# Patient Record
Sex: Female | Born: 1945 | Race: White | Hispanic: No | Marital: Married | Smoking: Former smoker
Health system: Southern US, Community
[De-identification: ages and names within clinical notes are randomized; demographics above are authoritative.]

## PROBLEM LIST (undated history)

## (undated) DIAGNOSIS — E039 Hypothyroidism, unspecified: Secondary | ICD-10-CM

## (undated) DIAGNOSIS — I10 Essential (primary) hypertension: Secondary | ICD-10-CM

## (undated) DIAGNOSIS — R000 Tachycardia, unspecified: Secondary | ICD-10-CM

## (undated) DIAGNOSIS — E785 Hyperlipidemia, unspecified: Secondary | ICD-10-CM

## (undated) DIAGNOSIS — R51 Headache: Secondary | ICD-10-CM

## (undated) DIAGNOSIS — K219 Gastro-esophageal reflux disease without esophagitis: Secondary | ICD-10-CM

## (undated) DIAGNOSIS — T7840XA Allergy, unspecified, initial encounter: Secondary | ICD-10-CM

## (undated) DIAGNOSIS — K5730 Diverticulosis of large intestine without perforation or abscess without bleeding: Secondary | ICD-10-CM

## (undated) DIAGNOSIS — H269 Unspecified cataract: Secondary | ICD-10-CM

## (undated) DIAGNOSIS — H3530 Unspecified macular degeneration: Secondary | ICD-10-CM

## (undated) DIAGNOSIS — Z720 Tobacco use: Secondary | ICD-10-CM

## (undated) DIAGNOSIS — M1990 Unspecified osteoarthritis, unspecified site: Secondary | ICD-10-CM

## (undated) DIAGNOSIS — K760 Fatty (change of) liver, not elsewhere classified: Secondary | ICD-10-CM

## (undated) HISTORY — PX: EYE SURGERY: SHX253

## (undated) HISTORY — PX: OTHER SURGICAL HISTORY: SHX169

## (undated) HISTORY — DX: Allergy, unspecified, initial encounter: T78.40XA

## (undated) HISTORY — PX: SPINE SURGERY: SHX786

## (undated) HISTORY — DX: Diverticulosis of large intestine without perforation or abscess without bleeding: K57.30

## (undated) HISTORY — PX: SIGMOIDOSCOPY: SUR1295

## (undated) HISTORY — DX: Hyperlipidemia, unspecified: E78.5

## (undated) HISTORY — DX: Fatty (change of) liver, not elsewhere classified: K76.0

## (undated) HISTORY — DX: Hypothyroidism, unspecified: E03.9

## (undated) HISTORY — DX: Essential (primary) hypertension: I10

## (undated) HISTORY — DX: Tobacco use: Z72.0

## (undated) HISTORY — PX: COLONOSCOPY: SHX174

## (undated) HISTORY — DX: Unspecified macular degeneration: H35.30

## (undated) HISTORY — DX: Unspecified osteoarthritis, unspecified site: M19.90

## (undated) HISTORY — PX: CATARACT EXTRACTION: SUR2

## (undated) HISTORY — DX: Gastro-esophageal reflux disease without esophagitis: K21.9

---

## 2004-05-23 ENCOUNTER — Ambulatory Visit: Payer: Self-pay | Admitting: Family Medicine

## 2004-05-30 ENCOUNTER — Ambulatory Visit: Payer: Self-pay | Admitting: Family Medicine

## 2004-06-08 ENCOUNTER — Ambulatory Visit: Payer: Self-pay | Admitting: Family Medicine

## 2004-07-07 ENCOUNTER — Ambulatory Visit: Payer: Self-pay | Admitting: Family Medicine

## 2004-07-13 ENCOUNTER — Ambulatory Visit: Payer: Self-pay | Admitting: Family Medicine

## 2004-09-11 ENCOUNTER — Ambulatory Visit: Payer: Self-pay | Admitting: Family Medicine

## 2004-09-18 ENCOUNTER — Ambulatory Visit: Payer: Self-pay | Admitting: Family Medicine

## 2004-12-22 ENCOUNTER — Ambulatory Visit: Payer: Self-pay | Admitting: Family Medicine

## 2005-01-26 ENCOUNTER — Ambulatory Visit: Payer: Self-pay | Admitting: Family Medicine

## 2005-02-23 ENCOUNTER — Ambulatory Visit: Payer: Self-pay | Admitting: Family Medicine

## 2005-03-02 ENCOUNTER — Ambulatory Visit: Payer: Self-pay | Admitting: Family Medicine

## 2005-05-07 ENCOUNTER — Ambulatory Visit: Payer: Self-pay | Admitting: Family Medicine

## 2005-06-18 ENCOUNTER — Ambulatory Visit: Payer: Self-pay | Admitting: Family Medicine

## 2005-06-19 ENCOUNTER — Ambulatory Visit: Payer: Self-pay | Admitting: Family Medicine

## 2005-07-30 ENCOUNTER — Ambulatory Visit: Payer: Self-pay | Admitting: Family Medicine

## 2005-08-30 ENCOUNTER — Encounter: Payer: Self-pay | Admitting: Family Medicine

## 2005-08-30 LAB — CONVERTED CEMR LAB: Pap Smear: NORMAL

## 2005-09-13 ENCOUNTER — Ambulatory Visit: Payer: Self-pay | Admitting: Family Medicine

## 2005-09-17 ENCOUNTER — Ambulatory Visit: Payer: Self-pay | Admitting: Family Medicine

## 2005-09-21 ENCOUNTER — Ambulatory Visit: Payer: Self-pay | Admitting: Family Medicine

## 2005-09-24 ENCOUNTER — Ambulatory Visit: Payer: Self-pay | Admitting: Family Medicine

## 2005-09-24 ENCOUNTER — Other Ambulatory Visit: Admission: RE | Admit: 2005-09-24 | Discharge: 2005-09-24 | Payer: Self-pay | Admitting: Family Medicine

## 2005-09-24 ENCOUNTER — Encounter: Payer: Self-pay | Admitting: Family Medicine

## 2005-10-15 ENCOUNTER — Ambulatory Visit: Payer: Self-pay | Admitting: Family Medicine

## 2005-10-17 ENCOUNTER — Ambulatory Visit: Payer: Self-pay | Admitting: *Deleted

## 2005-11-06 ENCOUNTER — Ambulatory Visit: Payer: Self-pay | Admitting: Family Medicine

## 2006-04-02 ENCOUNTER — Ambulatory Visit: Payer: Self-pay | Admitting: Internal Medicine

## 2006-08-02 ENCOUNTER — Encounter: Payer: Self-pay | Admitting: Family Medicine

## 2007-01-09 ENCOUNTER — Ambulatory Visit: Payer: Self-pay | Admitting: Family Medicine

## 2007-01-09 LAB — CONVERTED CEMR LAB
ALT: 46 units/L — ABNORMAL HIGH (ref 0–35)
BUN: 13 mg/dL (ref 6–23)
Basophils Absolute: 0 10*3/uL (ref 0.0–0.1)
Chloride: 104 meq/L (ref 96–112)
GFR calc Af Amer: 72 mL/min
GFR calc non Af Amer: 60 mL/min
Glucose, Bld: 114 mg/dL — ABNORMAL HIGH (ref 70–99)
HDL: 37.2 mg/dL — ABNORMAL LOW (ref >39.0)
Hemoglobin: 15.4 g/dL — ABNORMAL HIGH (ref 12.0–15.0)
Lymphocytes Relative: 35 % (ref 12.0–46.0)
MCHC: 35.4 g/dL (ref 30.0–36.0)
MCV: 96.7 fL (ref 78.0–100.0)
Monocytes Absolute: 0.5 10*3/uL (ref 0.2–0.7)
Neutro Abs: 3 10*3/uL (ref 1.4–7.7)
Neutrophils Relative %: 53.4 % (ref 43.0–77.0)
Platelets: 201 10*3/uL (ref 150–400)
Potassium: 4.3 meq/L (ref 3.5–5.1)
Total CHOL/HDL Ratio: 4.5

## 2007-01-10 ENCOUNTER — Encounter: Payer: Self-pay | Admitting: Family Medicine

## 2007-01-10 DIAGNOSIS — M1990 Unspecified osteoarthritis, unspecified site: Secondary | ICD-10-CM | POA: Insufficient documentation

## 2007-01-10 DIAGNOSIS — R7309 Other abnormal glucose: Secondary | ICD-10-CM

## 2007-01-10 DIAGNOSIS — K8020 Calculus of gallbladder without cholecystitis without obstruction: Secondary | ICD-10-CM | POA: Insufficient documentation

## 2007-01-10 DIAGNOSIS — E785 Hyperlipidemia, unspecified: Secondary | ICD-10-CM

## 2007-01-10 DIAGNOSIS — E039 Hypothyroidism, unspecified: Secondary | ICD-10-CM | POA: Insufficient documentation

## 2007-01-10 DIAGNOSIS — K5730 Diverticulosis of large intestine without perforation or abscess without bleeding: Secondary | ICD-10-CM | POA: Insufficient documentation

## 2007-01-10 DIAGNOSIS — M109 Gout, unspecified: Secondary | ICD-10-CM

## 2007-01-10 DIAGNOSIS — N951 Menopausal and female climacteric states: Secondary | ICD-10-CM

## 2007-01-10 DIAGNOSIS — I10 Essential (primary) hypertension: Secondary | ICD-10-CM | POA: Insufficient documentation

## 2007-01-14 ENCOUNTER — Encounter (INDEPENDENT_AMBULATORY_CARE_PROVIDER_SITE_OTHER): Payer: Self-pay | Admitting: *Deleted

## 2007-01-14 ENCOUNTER — Ambulatory Visit: Payer: Self-pay | Admitting: Family Medicine

## 2007-01-14 ENCOUNTER — Other Ambulatory Visit: Admission: RE | Admit: 2007-01-14 | Discharge: 2007-01-14 | Payer: Self-pay | Admitting: Family Medicine

## 2007-01-14 ENCOUNTER — Encounter: Payer: Self-pay | Admitting: Family Medicine

## 2007-01-16 ENCOUNTER — Encounter (INDEPENDENT_AMBULATORY_CARE_PROVIDER_SITE_OTHER): Payer: Self-pay | Admitting: *Deleted

## 2007-01-17 ENCOUNTER — Encounter (INDEPENDENT_AMBULATORY_CARE_PROVIDER_SITE_OTHER): Payer: Self-pay | Admitting: *Deleted

## 2007-01-30 ENCOUNTER — Encounter (INDEPENDENT_AMBULATORY_CARE_PROVIDER_SITE_OTHER): Payer: Self-pay | Admitting: *Deleted

## 2007-01-30 ENCOUNTER — Ambulatory Visit: Payer: Self-pay | Admitting: Family Medicine

## 2007-01-31 ENCOUNTER — Ambulatory Visit: Payer: Self-pay | Admitting: Family Medicine

## 2007-06-06 ENCOUNTER — Ambulatory Visit: Payer: Self-pay | Admitting: *Deleted

## 2007-08-13 ENCOUNTER — Encounter: Payer: Self-pay | Admitting: Family Medicine

## 2007-11-11 ENCOUNTER — Ambulatory Visit: Payer: Self-pay | Admitting: Family Medicine

## 2008-03-09 ENCOUNTER — Ambulatory Visit: Payer: Self-pay | Admitting: Family Medicine

## 2008-04-19 ENCOUNTER — Telehealth: Payer: Self-pay | Admitting: Family Medicine

## 2008-06-21 ENCOUNTER — Ambulatory Visit: Payer: Self-pay | Admitting: Family Medicine

## 2008-06-21 LAB — CONVERTED CEMR LAB
Albumin: 3.9 g/dL (ref 3.5–5.2)
Basophils Absolute: 0 10*3/uL (ref 0.0–0.1)
Bilirubin, Direct: 0.3 mg/dL (ref 0.0–0.3)
Calcium: 9.9 mg/dL (ref 8.4–10.5)
Chloride: 107 meq/L (ref 96–112)
Eosinophils Absolute: 0.1 10*3/uL (ref 0.0–0.7)
GFR calc Af Amer: 93 mL/min
GFR calc non Af Amer: 77 mL/min
Glucose, Bld: 108 mg/dL — ABNORMAL HIGH (ref 70–99)
Lymphocytes Relative: 35.6 % (ref 12.0–46.0)
MCV: 99.3 fL (ref 78.0–100.0)
Microalb, Ur: 0.7 mg/dL (ref 0.0–1.9)
Monocytes Absolute: 0.7 10*3/uL (ref 0.1–1.0)
Monocytes Relative: 10.1 % (ref 3.0–12.0)
Neutro Abs: 3.6 10*3/uL (ref 1.4–7.7)
Neutrophils Relative %: 52.3 % (ref 43.0–77.0)
Platelets: 179 10*3/uL (ref 150–400)
RBC: 4.76 M/uL (ref 3.87–5.11)
RDW: 11.8 % (ref 11.5–14.6)
Sodium: 144 meq/L (ref 135–145)
TSH: 2.02 microintl units/mL (ref 0.35–5.50)
Uric Acid, Serum: 7.6 mg/dL — ABNORMAL HIGH (ref 2.4–7.0)

## 2008-06-22 LAB — CONVERTED CEMR LAB: Vit D, 1,25-Dihydroxy: 31 (ref 30–89)

## 2008-07-08 ENCOUNTER — Ambulatory Visit: Payer: Self-pay | Admitting: Family Medicine

## 2008-07-08 ENCOUNTER — Other Ambulatory Visit: Admission: RE | Admit: 2008-07-08 | Discharge: 2008-07-08 | Payer: Self-pay | Admitting: Family Medicine

## 2008-07-08 ENCOUNTER — Encounter: Payer: Self-pay | Admitting: Family Medicine

## 2008-07-08 LAB — CONVERTED CEMR LAB: Pap Smear: NORMAL

## 2008-07-12 ENCOUNTER — Encounter (INDEPENDENT_AMBULATORY_CARE_PROVIDER_SITE_OTHER): Payer: Self-pay | Admitting: *Deleted

## 2008-07-23 ENCOUNTER — Ambulatory Visit: Payer: Self-pay | Admitting: Family Medicine

## 2008-07-23 ENCOUNTER — Encounter (INDEPENDENT_AMBULATORY_CARE_PROVIDER_SITE_OTHER): Payer: Self-pay | Admitting: *Deleted

## 2008-07-23 LAB — CONVERTED CEMR LAB: OCCULT 1: NEGATIVE

## 2008-08-27 ENCOUNTER — Encounter: Payer: Self-pay | Admitting: Family Medicine

## 2009-03-31 ENCOUNTER — Telehealth: Payer: Self-pay | Admitting: Family Medicine

## 2009-06-13 ENCOUNTER — Ambulatory Visit: Payer: Self-pay | Admitting: Family Medicine

## 2009-07-15 ENCOUNTER — Ambulatory Visit: Payer: Self-pay | Admitting: Family Medicine

## 2009-07-16 ENCOUNTER — Encounter: Payer: Self-pay | Admitting: Family Medicine

## 2009-07-19 LAB — CONVERTED CEMR LAB
ALT: 64 units/L — ABNORMAL HIGH (ref 0–35)
AST: 42 units/L — ABNORMAL HIGH (ref 0–37)
Albumin: 4.7 g/dL (ref 3.5–5.2)
Alkaline Phosphatase: 65 units/L (ref 39–117)
Basophils Absolute: 0 10*3/uL (ref 0.0–0.1)
CO2: 22 meq/L (ref 19–32)
Creatinine, Ser: 0.93 mg/dL (ref 0.40–1.20)
Creatinine,U: 185.8 mg/dL
Eosinophils Relative: 1.6 % (ref 0.0–5.0)
Glucose, Bld: 101 mg/dL — ABNORMAL HIGH (ref 70–99)
HDL: 44 mg/dL (ref >39)
Hemoglobin: 16.3 g/dL — ABNORMAL HIGH (ref 12.0–15.0)
Hgb A1c MFr Bld: 5.5 % (ref 4.6–6.5)
LDL Cholesterol: 81 mg/dL (ref 0–99)
Lymphs Abs: 1.7 10*3/uL (ref 0.7–4.0)
MCHC: 33.5 g/dL (ref 30.0–36.0)
Microalb Creat Ratio: 4.8 mg/g (ref 0.0–30.0)
Monocytes Absolute: 0.5 10*3/uL (ref 0.1–1.0)
Neutrophils Relative %: 66.1 % (ref 43.0–77.0)
Platelets: 194 10*3/uL (ref 150.0–400.0)
Potassium: 4.8 meq/L (ref 3.5–5.3)
RBC: 4.8 M/uL (ref 3.87–5.11)
Sodium: 144 meq/L (ref 135–145)
Total CHOL/HDL Ratio: 3.5
Triglycerides: 138 mg/dL (ref <150)

## 2009-07-20 ENCOUNTER — Other Ambulatory Visit: Admission: RE | Admit: 2009-07-20 | Discharge: 2009-07-20 | Payer: Self-pay | Admitting: Family Medicine

## 2009-07-20 ENCOUNTER — Ambulatory Visit: Payer: Self-pay | Admitting: Family Medicine

## 2009-07-20 ENCOUNTER — Telehealth: Payer: Self-pay | Admitting: Family Medicine

## 2009-07-27 ENCOUNTER — Encounter (INDEPENDENT_AMBULATORY_CARE_PROVIDER_SITE_OTHER): Payer: Self-pay | Admitting: *Deleted

## 2009-08-05 ENCOUNTER — Ambulatory Visit: Payer: Self-pay | Admitting: Family Medicine

## 2009-08-05 LAB — CONVERTED CEMR LAB
Bilirubin Urine: NEGATIVE
Protein, U semiquant: NEGATIVE
Specific Gravity, Urine: <1.005
Urobilinogen, UA: 0.2

## 2009-08-29 ENCOUNTER — Ambulatory Visit: Payer: Self-pay | Admitting: Family Medicine

## 2009-08-29 LAB — CONVERTED CEMR LAB
OCCULT 1: NEGATIVE
OCCULT 3: NEGATIVE

## 2009-09-02 ENCOUNTER — Encounter: Payer: Self-pay | Admitting: Family Medicine

## 2009-09-05 ENCOUNTER — Encounter (INDEPENDENT_AMBULATORY_CARE_PROVIDER_SITE_OTHER): Payer: Self-pay | Admitting: *Deleted

## 2009-09-06 ENCOUNTER — Encounter: Payer: Self-pay | Admitting: Family Medicine

## 2009-09-07 ENCOUNTER — Telehealth: Payer: Self-pay | Admitting: Family Medicine

## 2009-10-14 ENCOUNTER — Ambulatory Visit: Payer: Self-pay | Admitting: Family Medicine

## 2009-10-14 LAB — CONVERTED CEMR LAB: TSH: 0.07 microintl units/mL — ABNORMAL LOW (ref 0.35–5.50)

## 2009-10-18 ENCOUNTER — Ambulatory Visit: Payer: Self-pay | Admitting: Family Medicine

## 2010-02-06 ENCOUNTER — Encounter (INDEPENDENT_AMBULATORY_CARE_PROVIDER_SITE_OTHER): Payer: Self-pay | Admitting: *Deleted

## 2010-02-14 ENCOUNTER — Encounter: Payer: Self-pay | Admitting: Family Medicine

## 2010-03-10 ENCOUNTER — Encounter: Payer: Self-pay | Admitting: Family Medicine

## 2010-03-10 LAB — HM MAMMOGRAPHY: HM Mammogram: NORMAL

## 2010-03-13 ENCOUNTER — Encounter (INDEPENDENT_AMBULATORY_CARE_PROVIDER_SITE_OTHER): Payer: Self-pay | Admitting: *Deleted

## 2010-03-13 ENCOUNTER — Encounter: Payer: Self-pay | Admitting: Family Medicine

## 2010-07-04 DIAGNOSIS — F17200 Nicotine dependence, unspecified, uncomplicated: Secondary | ICD-10-CM | POA: Insufficient documentation

## 2010-08-01 NOTE — Miscellaneous (Signed)
  Clinical Lists Changes  Observations: Added new observation of MAMMO DUE: 03/11/2011 (03/10/2010 16:49) Added new observation of MAMMOGRAM: Normal (03/10/2010 16:49)

## 2010-08-01 NOTE — Progress Notes (Signed)
Summary: refill request for colchicine  Phone Note Refill Request Message from:  Fax from Pharmacy  Refills Requested: Medication #1:  COLCHICINE 0.6 MG TABS Take 1 tablet by mouth twice a day as needed [facility] [place]  No last date filled given.  Initial call taken by: Lowella Petties CMA,  September 07, 2009 3:28 PM    Prescriptions: COLCHICINE 0.6 MG TABS (COLCHICINE) Take 1 tablet by mouth twice a day as needed  #60 x 0   Entered and Authorized by:   Shaune Leeks MD   Signed by:   Shaune Leeks MD on 09/07/2009   Method used:   [facility]        [facility] [phone] (retail[facility]       [address]       [place], [place]  [address]       Ph: [phone]       Fax: [phone]   RxID:   [id number]

## 2010-08-01 NOTE — Assessment & Plan Note (Signed)
Summary: ?UTI/CLE   Vital Signs:  Patient profile:   65 year old female Height:      64 inches Weight:      180.50 pounds BMI:     31.09 Temp:     98 degrees F oral Pulse rate:   84 / minute Pulse rhythm:   regular BP sitting:   130 / 76  (left arm) Cuff size:   regular  Vitals Entered By: Delilah Shan CMA Duncan Dull) (August 05, 2009 12:03 PM) CC: ? UTI   History of Present Illness: 65 yo with UTI symptoms.  Increased frequency, dysuria x 4 days. No back pain. No fevers. No n/v/d. No hematuria. Does not get UTIs frequently, says she has probably had 2 or 3 in her lifetime.  Current Medications (verified): 1)  Colchicine 0.6 Mg Tabs (Colchicine) .... Take 1 Tablet By Mouth Twice A Day As Needed 2)  Levothyroxine Sodium 200 Mcg Tabs (Levothyroxine Sodium) .... One Tab By Mouth Once Daily 3)  Toprol Xl 200 Mg  Tb24 (Metoprolol Succinate) .Marland Kitchen.. 1 By Mouth Once Daily 4)  Centrum Silver   Tabs (Multiple Vitamins-Minerals) .... Once Daily 5)  Caltrate 600 1500 Mg  Tabs (Calcium Carbonate) .... Once Daily 6)  Lipitor 20 Mg  Tabs (Atorvastatin Calcium) .... Once Daily 7)  Cipro 250 Mg Tabs (Ciprofloxacin Hcl) .Marland Kitchen.. 1 Tab By Mouth Two Times A Day X 3 Days  Allergies: 1)  ! Penicillin V Potassium (Penicillin V Potassium) 2)  ! Tetracycline Hcl (Tetracycline Hcl) 3)  ! Sulfasalazine (Sulfasalazine) 4)  ! Biaxin (Clarithromycin) 5)  ! * Aces 6)  ! Tricor (Fenofibrate) 7)  ! Slo-Niacin (Niacin)  Review of Systems      See HPI General:  Denies chills, fever, and malaise. GI:  Denies abdominal pain, nausea, and vomiting. GU:  Complains of dysuria and urinary frequency; denies hematuria and incontinence.  Physical Exam  General:  Well-developed,well-nourished,in no acute distress; alert,appropriate and cooperative throughout examination. VS reviewed- afebrile and normotensive. Mouth:  MMM Abdomen:  NO suprapubic tenderness NO CVA tenderness. Psych:  Cognition and judgment  appear intact. Alert and cooperative with normal attention span and concentration. No apparent delusions, illusions, hallucinations   Impression & Recommendations:  Problem # 1:  ACUTE CYSTITIS (ICD-595.0) Assessment New Uncomplicated. Will treat with 3 day course of cipro. Send for cultlure only b/c she has so many allergies to antibiotics so if she fails cipro, will need to know sensitivities. Her updated medication list for this problem includes:    Cipro 250 Mg Tabs (Ciprofloxacin hcl) .Marland Kitchen... 1 tab by mouth two times a day x 3 days  Complete Medication List: 1)  Colchicine 0.6 Mg Tabs (Colchicine) .... Take 1 tablet by mouth twice a day as needed 2)  Levothyroxine Sodium 200 Mcg Tabs (Levothyroxine sodium) .... One tab by mouth once daily 3)  Toprol Xl 200 Mg Tb24 (Metoprolol succinate) .Marland Kitchen.. 1 by mouth once daily 4)  Centrum Silver Tabs (Multiple vitamins-minerals) .... Once daily 5)  Caltrate 600 1500 Mg Tabs (Calcium carbonate) .... Once daily 6)  Lipitor 20 Mg Tabs (Atorvastatin calcium) .... Once daily 7)  Cipro 250 Mg Tabs (Ciprofloxacin hcl) .Marland Kitchen.. 1 tab by mouth two times a day x 3 days  Other Orders: UA Dipstick w/o Micro (manual) (84696) T-Culture, Urine ([id number])  Patient Instructions: 1)  Drink plenty of fluids up to 3-4 quarts a day. Cranberry juice is especially recommended in addition to large amounts of water. Avoid caffeine &  carbonated drinks, they tend to irritate the bladder, Return in 3-5 days if you're not better: sooner if you're feeling worse.  Prescriptions: CIPRO 250 MG TABS (CIPROFLOXACIN HCL) 1 tab by mouth two times a day x 3 days  #6 x 0   Entered and Authorized by:   Ruthe Mannan MD   Signed by:   Ruthe Mannan MD on 08/05/2009   Method used:   [facility]        [facility] [phone]* (retail)       [address]       [place], [place]  [address]       Ph: [phone]       Fax: [phone]   RxID:   [id number]   Current  Allergies (reviewed today): ! PENICILLIN V POTASSIUM (PENICILLIN V POTASSIUM) ! TETRACYCLINE HCL (TETRACYCLINE HCL) ! SULFASALAZINE (SULFASALAZINE) ! BIAXIN (CLARITHROMYCIN) ! * ACES ! TRICOR (FENOFIBRATE) ! SLO-NIACIN (NIACIN)  Laboratory Results   Urine Tests   Date/Time Reported: August 05, 2009 12:16 PM   Routine Urinalysis   Color: straw Appearance: Cloudy Glucose: negative   (Normal Range: Negative) Bilirubin: negative   (Normal Range: Negative) Ketone: negative   (Normal Range: Negative) Spec. Gravity: <1.005   (Normal Range: 1.003-1.035) Blood: small   (Normal Range: Negative) pH: 6.0   (Normal Range: 5.0-8.0) Protein: negative   (Normal Range: Negative) Urobilinogen: 0.2   (Normal Range: 0-1) Nitrite: negative   (Normal Range: Negative) Leukocyte Esterace: moderate   (Normal Range: Negative)       Appended Document: ?UTI/CLE

## 2010-08-01 NOTE — Progress Notes (Signed)
Summary: Verification of Levothyroxin med  Phone Note Call from Patient Call back at [phone]   Caller: Patient Call For: Shaune Leeks MD Summary of Call: Pt left v/m on triage phone that the wrong med had been sent to pharmacy. I called pt but no answer and [facility] [place] and [facility] first time they have filled med. Both Levothyroxine sodium and [facility] [place]. Which do you want pt to have or . Thank you. Initial call taken by: Lewanda Rife LPN,  July 20, 2009 10:38 AM  Follow-up for Phone Call        200. Sorry...she is supposed to call. Thank you. Follow-up by: Shaune Leeks MD,  July 20, 2009 10:41 AM  Additional Follow-up for Phone Call Additional follow up Details #1::        Pharmacy notified as instructed by telephone. Additional Follow-up by: Sydell Axon LPN,  July 20, 2009 10:45 AM     Appended Document: Verification of Levothyroxin med Pt called to make sure her med had been called in. I told pt the pharmacy had been notified of correct medication and to call pharmacy before going to pick med up to make sure it was ready. Pt was appreciative.

## 2010-08-01 NOTE — Letter (Signed)
Summary: Results Follow up Letter  [place] at [facility]  [address] [place], [place] [address]   Phone: [phone]  Fax: [phone]    09/05/2009 MRN: [id number]  [facility] [address] [place], [place]  [address]  Dear Ms. Sanon,  The following are the results of your recent test(s):  Test         Result    Pap Smear:        Normal _____  Not Normal _____ Comments: ______________________________________________________ Cholesterol: LDL(Bad cholesterol):         Your goal is less than:         HDL (Good cholesterol):       Your goal is more than: Comments:  ______________________________________________________ Mammogram:        Normal _____  Not Normal _____ Comments:  ___________________________________________________________________ Hemoccult:        Normal __X___  Not normal _______ Comments:  Please repeat in one year.  _____________________________________________________________________ Other Tests:    We routinely do not discuss normal results over the telephone.  If you desire a copy of the results, or you have any questions about this information we can discuss them at your next office visit.   Sincerely,     Laurita Quint, MD

## 2010-08-01 NOTE — Letter (Signed)
Summary: Results Follow up Letter  [place] at [facility]  [address] [place], [place] [address]   Phone: [phone]  Fax: [phone]    07/27/2009 MRN: [id number]  [facility] [address] [place], [place]  [address]  Dear Ms. Geller,  The following are the results of your recent test(s):  Test         Result    Pap Smear:        Normal _X____  Not Normal _____ Comments: Please follow-up in one year. ______________________________________________________ Cholesterol: LDL(Bad cholesterol):         Your goal is less than:         HDL (Good cholesterol):       Your goal is more than: Comments:  ______________________________________________________ Mammogram:        Normal _____  Not Normal _____ Comments:  ___________________________________________________________________ Hemoccult:        Normal _____  Not normal _______ Comments:    _____________________________________________________________________ Other Tests:    We routinely do not discuss normal results over the telephone.  If you desire a copy of the results, or you have any questions about this information we can discuss them at your next office visit.   Sincerely,     Laurita Quint, MD

## 2010-08-01 NOTE — Letter (Signed)
Summary: Nadara Eaton letter  [place] at [facility]  [address] [place], [place] [address]   Phone: [phone]  Fax: [phone]       02/06/2010 MRN: [id number]  [facility] [address] [place], [place]  [address]  Dear Ms. Corle,  [facility] and [place] announce the retirement of Arta Silence, M.D., from full-time practice at the [facility] office effective December 29, 2009 and his plans of returning part-time.  It is important to Dr. Hetty Ely and to our practice that you understand that [facility] has seven physicians in our office for your health care needs.  We will continue to offer the same exceptional care that you have today.    Dr. Hetty Ely has spoken to many of you about his plans for retirement and returning part-time in the fall.   We will continue to work with you through the transition to schedule appointments for you in the office and meet the high standards that [place] is committed to.   Again, it is with great pleasure that we share the news that Dr. Hetty Ely will return to [facility] in October of 2011 with a reduced schedule.    If you have any questions, or would like to request an appointment with one of our physicians, please call us at [phone] and press the option for Scheduling an appointment.  We take pleasure in providing you with excellent patient care and look forward to seeing you at your next office visit.  Our [facility] Physicians are:  Tillman Abide, M.D. Laurita Quint, M.D. Roxy Manns, M.D. Kerby Nora, M.D. Hannah Beat, M.D. Ruthe Mannan, M.D. We proudly welcomed Raechel Ache, M.D. and Eustaquio Boyden, M.D. to the practice in July/August 2011.  Sincerely,  [facility]

## 2010-08-01 NOTE — Letter (Signed)
Summary: Results Follow up Letter  [place] at [facility]  [address] [place], [place] [address]   Phone: [phone]  Fax: [phone]    03/13/2010 MRN: [id number]    [facility] [address] [place], [place]  [address]     Dear Ms. Burkel,  The following are the results of your recent test(s):  Test         Result    Pap Smear:        Normal _____  Not Normal _____ Comments: ______________________________________________________ Cholesterol: LDL(Bad cholesterol):         Your goal is less than:         HDL (Good cholesterol):       Your goal is more than: Comments:  ______________________________________________________ Mammogram:        Normal __X___  Not Normal _____ Comments:  Yearly follow up is recommended.   ___________________________________________________________________ Hemoccult:        Normal _____  Not normal _______ Comments:    _____________________________________________________________________ Other Tests:    We routinely do not discuss normal results over the telephone.  If you desire a copy of the results, or you have any questions about this information we can discuss them at your next office visit.   Sincerely,   Dwana Curd. Para March, M.D.  [facility]

## 2010-08-01 NOTE — Assessment & Plan Note (Signed)
Summary: CPX/RBH   Vital Signs:  Patient profile:   65 year old female Weight:      185 pounds Temp:     98.6 degrees F oral Pulse rate:   84 / minute Pulse rhythm:   regular BP sitting:   126 / 78  (left arm) Cuff size:   large  Vitals Entered By: Sydell Axon LPN (July 20, 2009 8:35 AM) CC: 30 Minute checkup, hemoccult cards given to patient   History of Present Illness: Pt here for Comp Exam, still congested. Pt doing well and feels fine.  Preventive Screening-Counseling & Management  Alcohol-Tobacco     Alcohol drinks/day: <1     Alcohol type: wine      Smoking Status: current     Smoking Cessation Counseling: yes     Year Started: 1967, quit with kids and restarted.  Caffeine-Diet-Exercise     Caffeine use/day: 1     Does Patient Exercise: yes     Type of exercise: walking     Exercise (avg: min/session):     Times/week: 5  Problems Prior to Update: 1)  Sinusitis - Acute-nos  (ICD-461.9) 2)  Screening For Malignannt Neoplasm, Site Nec  (ICD-V76.49) 3)  Health Maintenance Exam  (ICD-V70.0) 4)  Cholelithiasis  (ICD-574.20) 5)  Hyperglycemia  (ICD-790.29) 6)  Disorder, Tobacco Use  (ICD-305.1) 7)  Menopausal Syndrome  (ICD-627.2) 8)  Osteoarthritis  (ICD-715.90) 9)  Diverticulosis, Colon  (ICD-562.10) 10)  Hypothyroidism Nos  (ICD-244.9) 11)  Gout Nos  (ICD-274.9) 12)  Hyperlipidemia Nec/nos  (ICD-272.4) 13)  Hypertension, Benign Essential  (ICD-401.1)  Medications Prior to Update: 1)  Colchicine 0.6 Mg Tabs (Colchicine) .... Take 1 Tablet By Mouth Twice A Day Prn 2)  Levothyroxine Sodium 175 Mcg  Tabs (Levothyroxine Sodium) .Marland Kitchen.. 1 By Mouth Once Daily 3)  Toprol Xl 200 Mg  Tb24 (Metoprolol Succinate) .Marland Kitchen.. 1 By Mouth Once Daily 4)  Centrum Silver   Tabs (Multiple Vitamins-Minerals) .... Once Daily 5)  Caltrate 600 1500 Mg  Tabs (Calcium Carbonate) .... Once Daily 6)  Lipitor 20 Mg  Tabs (Atorvastatin Calcium) .... Once Daily  Allergies: 1)  !  Penicillin V Potassium (Penicillin V Potassium) 2)  ! Tetracycline Hcl (Tetracycline Hcl) 3)  ! Sulfasalazine (Sulfasalazine) 4)  ! Biaxin (Clarithromycin) 5)  ! * Aces 6)  ! Tricor (Fenofibrate) 7)  ! Slo-Niacin (Niacin)  Past History:  Past Medical History: Last updated: 03/09/2008 Diverticulosis, colon Gout Hyperlipidemia Hypertension Hypothyroidism Osteoarthritis tab abuse   Past Surgical History: Last updated: 01/14/2007             NSVD x 2 9/99      Flex/Wood nml. 3/02      DEXA nml. 07/2005  CT abdomen and pelvis  Family History: Last updated: Jul 26, 2008 Father: Died at 57 11-29-1982) ulcer surgery, surgical error         18-Nov-2022 Mother: Died 2, angina, ? 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 Eyes:  Denies blurring, discharge, and eye pain. ENT:  Denies decreased hearing, ear discharge, earache, and ringing in ears. CV:  Complains of shortness of breath with exertion; denies chest pain or discomfort, fainting, fatigue, and palpitations; mild. Resp:  Complains of cough; denies shortness of breath and wheezing. GI:  Denies abdominal  pain, bloody stools, change in bowel habits, constipation, dark tarry stools, diarrhea, indigestion, loss of appetite, nausea, vomiting, vomiting blood, and yellowish skin color. GU:  Denies discharge, dysuria, nocturia, and urinary frequency. MS:  Complains of joint pain; denies muscle aches and stiffness; mild, normal for age. Derm:  Complains of dryness; denies hair loss, itching, and rash. Neuro:  Denies numbness, poor balance, tingling, and tremors.  Physical Exam  General:  Well-developed,well-nourished,in no acute distress; alert,appropriate and cooperative throughout examination. Head:  Normocephalic and atraumatic without obvious abnormalities. No apparent alopecia or balding. Sinuses NT. Eyes:  Conjunctiva clear bilaterally.  Ears:  External ear exam shows no significant lesions or deformities.  Otoscopic examination reveals clear canals, tympanic membranes are intact bilaterally without bulging, retraction, inflammation or discharge. Hearing is grossly normal bilaterally. Nose:  External nasal examination shows no deformity or inflammation. Nasal mucosa are pink and moist without lesions or exudates. Mouth:  Oral mucosa and oropharynx without lesions or exudates.  Teeth in good repair. Neck:  No deformities, masses, or tenderness noted. Chest Wall:  No deformities, masses, or tenderness noted. Breasts:  No mass, nodules, thickening, tenderness, bulging, retraction, inflamation, nipple discharge or skin changes noted.   Lungs:  Normal respiratory effort, chest expands symmetrically. Lungs are clear to auscultation, no crackles or wheezes. Heart:  Normal rate and regular rhythm. S1 and S2 normal without gallop, murmur, click, rub or other extra sounds. Abdomen:  Bowel sounds positive,abdomen soft and non-tender without masses, organomegaly or hernias noted. Rectal:  No external abnormalities noted. Normal sphincter tone. No rectal masses or tenderness. G neg. Genitalia:  Pelvic Exam:         External: normal female genitalia without lesions or masses        Vagina: normal without lesions or masses        Cervix: normal without lesions or masses        Adnexa: normal bimanual exam without masses or fullness        Uterus: normal by palpation        Pap smear: performed Msk:  No deformity or scoliosis noted of thoracic or lumbar spine.   Pulses:  R and L carotid,radial,femoral,dorsalis pedis and posterior tibial pulses are full and equal bilaterally Extremities:  No clubbing, cyanosis, edema, or deformity noted with normal full range of motion of all joints.   Neurologic:  No cranial nerve deficits noted. Station and gait are normal. Plantar reflexes are down-going bilaterally. DTRs are symmetrical throughout. Sensory, motor and coordinative functions appear intact. Skin:  Intact without suspicious lesions or rashes Cervical Nodes:  No lymphadenopathy noted Axillary Nodes:  No palpable lymphadenopathy Inguinal Nodes:  No significant adenopathy Psych:  Cognition and judgment appear intact. Alert and cooperative with normal attention span and concentration. No apparent delusions, illusions, hallucinations   Impression & Recommendations:  Problem # 1:  HEALTH MAINTENANCE EXAM (ICD-V70.0) Assessment Comment Only  Problem # 2:  SINUSITIS -  ACUTE-NOS (ICD-461.9) Assessment: Improved Better but still congested. See instructions.  Problem # 3:  HYPERGLYCEMIA (ICD-790.29) Assessment: Unchanged Stablee but still evident. Avoid sweets and carbs. Labs Reviewed: Creat: 0.93 (07/16/2009)     Problem # 4:  DISORDER, TOBACCO USE (ICD-305.1) Assessment: Unchanged Encouraged to quit.  Problem # 5:  DIVERTICULOSIS, COLON (ICD-562.10) Assessment: Unchanged Discussed need to be seen for prolonged LLQ discomfort.  Problem # 6:  HYPOTHYROIDISM NOS (ICD-244.9) Assessment: Deteriorated Needs slight increase in replacement.  Incr and recheck. Her updated medication list for this  problem includes:    Levothyroxine Sodium 200 Mcg Tabs (Levothyroxine sodium) ..... One tab by mouth once daily  Labs Reviewed: TSH: 5.83 (07/15/2009)    HgBA1c: 5.5 (07/15/2009) Chol: 153 (07/16/2009)   HDL: 44 (07/16/2009)   LDL: 81 (07/16/2009)   TG: 138 (07/16/2009)  Problem # 7:  HYPERLIPIDEMIA NEC/NOS (ICD-272.4) Assessment: Unchanged Well controlled and stable. Her updated medication list for this problem includes:    Lipitor 20 Mg Tabs (Atorvastatin calcium) ..... Once daily  Labs Reviewed: SGOT: 42 (07/16/2009)   SGPT: 64 (07/16/2009)   HDL:44 (07/16/2009), 46.6 (06/21/2008)  LDL:81 (07/16/2009), DEL (91/47/8295)  Chol:153 (07/16/2009), 174 (06/21/2008)  Trig:138 (07/16/2009), 228 (06/21/2008)  Problem # 8:  HYPERTENSION, BENIGN ESSENTIAL (ICD-401.1) Assessment: Unchanged Stable. Cont curr meds. Her updated medication list for this problem includes:    Toprol Xl 200 Mg Tb24 (Metoprolol succinate) .Marland Kitchen... 1 by mouth once daily  BP today: 126/78 Prior BP: 100/70 (06/13/2009)  Labs Reviewed: K+: 4.8 (07/16/2009) Creat: : 0.93 (07/16/2009)   Chol: 153 (07/16/2009)   HDL: 44 (07/16/2009)   LDL: 81 (07/16/2009)   TG: 138 (07/16/2009)  Complete Medication List: 1)  Colchicine 0.6 Mg Tabs (Colchicine) .... Take 1 tablet by mouth twice a day as needed 2)  Levothyroxine Sodium 200 Mcg Tabs (Levothyroxine sodium) .... One tab by mouth once daily 3)  Toprol Xl 200 Mg Tb24 (Metoprolol succinate) .Marland Kitchen.. 1 by mouth once daily 4)  Centrum Silver Tabs (Multiple vitamins-minerals) .... Once daily 5)  Caltrate 600 1500 Mg Tabs (Calcium carbonate) .... Once daily 6)  Lipitor 20 Mg Tabs (Atorvastatin calcium) .... Once daily  Patient Instructions: 1)  Take Guaifenesin by [facility] or [facility] and getting MUCOUS RELIEF EXPECTORANT (400mg ), take 11/2 tabs by mouth AM and NOON. 2)  Drink lots of fluids anytime taking Guaifenesin.  3)  Change thyroid med to , not  150. 4)  RTC 3 mos reheck TSH prior 244.9 Prescriptions: LEVOTHYROXINE SODIUM 200 MCG TABS (LEVOTHYROXINE SODIUM) one tab by mouth once daily  #90 x 4   Entered and Authorized by:   Shaune Leeks MD   Signed by:   Shaune Leeks MD on 07/20/2009   Method used:   [facility]        [facility] [phone]* (retail)       [address]       [place], [place]  [address]       Ph: [phone]       Fax: [phone]   RxID:   [id number] LEVOTHYROXINE SODIUM 150 MCG TABS (LEVOTHYROXINE SODIUM) one tab by mouth once daily  #90 x 4   Entered and Authorized by:   Shaune Leeks MD   Signed by:   Shaune Leeks MD on 07/20/2009   Method used:   [facility] [phone]* (retail)       [address]       [place], [place]  [address]       Ph: [phone]       Fax: [phone]   RxID:   [id number]   Current Allergies (reviewed today): ! PENICILLIN V POTASSIUM (PENICILLIN V POTASSIUM) ! TETRACYCLINE HCL (TETRACYCLINE HCL) ! SULFASALAZINE (SULFASALAZINE) ! BIAXIN (CLARITHROMYCIN) ! * ACES ! TRICOR (FENOFIBRATE) ! SLO-NIACIN (NIACIN)

## 2010-08-01 NOTE — Assessment & Plan Note (Signed)
Summary: FOLLOW UP   Vital Signs:  Patient profile:   65 year old female Weight:      170.25 pounds Temp:     98.1 degrees F oral Pulse rate:   64 / minute Pulse rhythm:   regular BP sitting:   120 / 76  (left arm) Cuff size:   regular  Vitals Entered By: Sydell Axon LPN (October 18, 2009 9:07 AM) CC: Follow-up after labs   History of Present Illness: Pt here for recheck of her thyroid. We increased her dose last time basically because she was minimally undercontrolled and wanted to have more energy. She has felt significantly better since being on the higher dose. She is back today for recheck. She feels great!! She also needs refills of other meds.  Problems Prior to Update: 1)  Acute Cystitis  (ICD-595.0) 2)  Dysuria  (ICD-788.1) 3)  Sinusitis - Acute-nos  (ICD-461.9) 4)  Screening For Malignannt Neoplasm, Site Nec  (ICD-V76.49) 5)  Health Maintenance Exam  (ICD-V70.0) 6)  Cholelithiasis  (ICD-574.20) 7)  Hyperglycemia  (ICD-790.29) 8)  Disorder, Tobacco Use  (ICD-305.1) 9)  Menopausal Syndrome  (ICD-627.2) 10)  Osteoarthritis  (ICD-715.90) 11)  Diverticulosis, Colon  (ICD-562.10) 12)  Hypothyroidism Nos  (ICD-244.9) 13)  Gout Nos  (ICD-274.9) 14)  Hyperlipidemia Nec/nos  (ICD-272.4) 15)  Hypertension, Benign Essential  (ICD-401.1)  Medications Prior to Update: 1)  Colchicine 0.6 Mg Tabs (Colchicine) .... Take 1 Tablet By Mouth Twice A Day As Needed 2)  Levothyroxine Sodium 200 Mcg Tabs (Levothyroxine Sodium) .... One Tab By Mouth Once Daily 3)  Toprol Xl 200 Mg  Tb24 (Metoprolol Succinate) .Marland Kitchen.. 1 By Mouth Once Daily 4)  Centrum Silver   Tabs (Multiple Vitamins-Minerals) .... Once Daily 5)  Caltrate 600 1500 Mg  Tabs (Calcium Carbonate) .... Once Daily 6)  Lipitor 20 Mg  Tabs (Atorvastatin Calcium) .... Once Daily 7)  Cipro 250 Mg Tabs (Ciprofloxacin Hcl) .Marland Kitchen.. 1 Tab By Mouth Two Times A Day X 3 Days  Allergies: 1)  ! Penicillin V Potassium (Penicillin V Potassium) 2)   ! Tetracycline Hcl (Tetracycline Hcl) 3)  ! Sulfasalazine (Sulfasalazine) 4)  ! Biaxin (Clarithromycin) 5)  ! * Aces 6)  ! Tricor (Fenofibrate) 7)  ! Slo-Niacin (Niacin)  Physical Exam  General:  Well-developed,well-nourished,in no acute distress; alert,appropriate and cooperative throughout examination. VS reviewed- afebrile and normotensive. Head:  Normocephalic and atraumatic without obvious abnormalities. No apparent alopecia or balding. Sinuses NT. Eyes:  Conjunctiva clear bilaterally.  Ears:  External ear exam shows no significant lesions or deformities.  Otoscopic examination reveals clear canals, tympanic membranes are intact bilaterally without bulging, retraction, inflammation or discharge. Hearing is grossly normal bilaterally. Nose:  External nasal examination shows no deformity or inflammation. Nasal mucosa are pink and moist without lesions or exudates. Mouth:  MMM Neck:  No deformities, masses, or tenderness noted. Lungs:  Normal respiratory effort, chest expands symmetrically. Lungs are clear to auscultation, no crackles or wheezes. Heart:  Normal rate and regular rhythm. S1 and S2 normal without gallop, murmur, click, rub or other extra sounds. Skin:  Hypertrophied nodule behind right ear, approx 1cmx21mm.   Impression & Recommendations:  Problem # 1:  HYPOTHYROIDISM NOS (ICD-244.9) Assessment Deteriorated  Overcontrolled. Will  decrease dose. May occas take of what she has left. Her updated medication list for this problem includes:    Levothyroxine Sodium 175 Mcg Tabs (Levothyroxine sodium) ..... One tab by mouth daily  Labs Reviewed: TSH: 0.07 (10/14/2009)  HgBA1c: 5.5 (07/15/2009) Chol: 153 (07/16/2009)   HDL: 44 (07/16/2009)   LDL: 81 (07/16/2009)   TG: 138 (07/16/2009)  Problem # 2:  HYPERLIPIDEMIA NEC/NOS (ICD-272.4) Needs refill.Marland KitchenMarland KitchenCont curr dose. Her updated medication list for this problem includes:    Lipitor 20 Mg Tabs (Atorvastatin  calcium) .Marland Kitchen... Take one by mouth daily  Labs Reviewed: SGOT: 42 (07/16/2009)   SGPT: 64 (07/16/2009)   HDL:44 (07/16/2009), 46.6 (06/21/2008)  LDL:81 (07/16/2009), DEL (78/46/9629)  Chol:153 (07/16/2009), 174 (06/21/2008)  Trig:138 (07/16/2009), 228 (06/21/2008)  Problem # 3:  HYPERTENSION, BENIGN ESSENTIAL (ICD-401.1) Assessment: Unchanged Great control. Cont curr dose of meds. Her updated medication list for this problem includes:    Toprol Xl 200 Mg Tb24 (Metoprolol succinate) .Marland Kitchen... 1 by mouth once daily  BP today: 120/76 Prior BP: 130/76 (08/05/2009)  Labs Reviewed: K+: 4.8 (07/16/2009) Creat: : 0.93 (07/16/2009)   Chol: 153 (07/16/2009)   HDL: 44 (07/16/2009)   LDL: 81 (07/16/2009)   TG: 138 (07/16/2009)  Complete Medication List: 1)  Colchicine 0.6 Mg Tabs (Colchicine) .... Take 1 tablet by mouth twice a day as needed 2)  Levothyroxine Sodium 175 Mcg Tabs (Levothyroxine sodium) .... One tab by mouth daily 3)  Toprol Xl 200 Mg Tb24 (Metoprolol succinate) .Marland Kitchen.. 1 by mouth once daily 4)  Centrum Silver Tabs (Multiple vitamins-minerals) .... Once daily 5)  Caltrate 600 1500 Mg Tabs (Calcium carbonate) .... Once daily 6)  Lipitor 20 Mg Tabs (Atorvastatin calcium) .... Take one by mouth daily  Patient Instructions: 1)  Call end of next month for appt in Jan for Comp Exam. Prescriptions: LEVOTHYROXINE SODIUM 175 MCG TABS (LEVOTHYROXINE SODIUM) one tab by mouth daily  #30 x 12   Entered and Authorized by:   Shaune Leeks MD   Signed by:   Shaune Leeks MD on 10/18/2009   Method used:   Print then Give to Patient   RxID:   [id number] LIPITOR 20 MG  TABS (ATORVASTATIN CALCIUM) Take one by mouth daily  #90 x 3   Entered by:   Sydell Axon LPN   Authorized by:   Shaune Leeks MD   Signed by:   Sydell Axon LPN on 36/64/4034   Method used:   Print then Give to Patient   RxID:   [id number] TOPROL XL 200 MG  TB24 (METOPROLOL SUCCINATE) 1 by mouth once  daily  #90 x 3   Entered by:   Sydell Axon LPN   Authorized by:   Shaune Leeks MD   Signed by:   Sydell Axon LPN on 95/18/8416   Method used:   Print then Give to Patient   RxID:   [id number]   Current Allergies (reviewed today): ! PENICILLIN V POTASSIUM (PENICILLIN V POTASSIUM) ! TETRACYCLINE HCL (TETRACYCLINE HCL) ! SULFASALAZINE (SULFASALAZINE) ! BIAXIN (CLARITHROMYCIN) ! * ACES ! TRICOR (FENOFIBRATE) ! SLO-NIACIN (NIACIN)

## 2010-08-07 ENCOUNTER — Encounter (INDEPENDENT_AMBULATORY_CARE_PROVIDER_SITE_OTHER): Payer: Self-pay | Admitting: *Deleted

## 2010-08-07 ENCOUNTER — Other Ambulatory Visit (INDEPENDENT_AMBULATORY_CARE_PROVIDER_SITE_OTHER): Payer: BC Managed Care – PPO

## 2010-08-07 ENCOUNTER — Other Ambulatory Visit: Payer: Self-pay | Admitting: Family Medicine

## 2010-08-07 ENCOUNTER — Encounter: Payer: Self-pay | Admitting: Family Medicine

## 2010-08-07 DIAGNOSIS — R7309 Other abnormal glucose: Secondary | ICD-10-CM

## 2010-08-07 DIAGNOSIS — E785 Hyperlipidemia, unspecified: Secondary | ICD-10-CM

## 2010-08-07 DIAGNOSIS — M109 Gout, unspecified: Secondary | ICD-10-CM

## 2010-08-07 DIAGNOSIS — F17200 Nicotine dependence, unspecified, uncomplicated: Secondary | ICD-10-CM

## 2010-08-07 DIAGNOSIS — E039 Hypothyroidism, unspecified: Secondary | ICD-10-CM

## 2010-08-07 DIAGNOSIS — M1990 Unspecified osteoarthritis, unspecified site: Secondary | ICD-10-CM

## 2010-08-07 DIAGNOSIS — K5730 Diverticulosis of large intestine without perforation or abscess without bleeding: Secondary | ICD-10-CM

## 2010-08-07 LAB — CBC WITH DIFFERENTIAL/PLATELET
Basophils Relative: 0.4 % (ref 0.0–3.0)
Eosinophils Relative: 1.9 % (ref 0.0–5.0)
HCT: 45.3 % (ref 36.0–46.0)
MCV: 100 fl (ref 78.0–100.0)
Monocytes Absolute: 0.6 10*3/uL (ref 0.1–1.0)
Monocytes Relative: 8.5 % (ref 3.0–12.0)
Neutrophils Relative %: 53.9 % (ref 43.0–77.0)
RBC: 4.53 Mil/uL (ref 3.87–5.11)
WBC: 7 10*3/uL (ref 4.5–10.5)

## 2010-08-07 LAB — BASIC METABOLIC PANEL
BUN: 16 mg/dL (ref 6–23)
CO2: 30 mEq/L (ref 19–32)
Calcium: 9.9 mg/dL (ref 8.4–10.5)
GFR: 68.15 mL/min (ref >60.00)
Glucose, Bld: 113 mg/dL — ABNORMAL HIGH (ref 70–99)

## 2010-08-07 LAB — T4, FREE: Free T4: 0.97 ng/dL (ref 0.60–1.60)

## 2010-08-07 LAB — LIPID PANEL
HDL: 45.2 mg/dL (ref >39.00)
Triglycerides: 298 mg/dL — ABNORMAL HIGH (ref 0.0–149.0)

## 2010-08-07 LAB — MICROALBUMIN / CREATININE URINE RATIO
Creatinine,U: 91.7 mg/dL
Microalb Creat Ratio: 0.9 mg/g (ref 0.0–30.0)

## 2010-08-07 LAB — HEPATIC FUNCTION PANEL: Total Bilirubin: 1.2 mg/dL (ref 0.3–1.2)

## 2010-08-07 LAB — URIC ACID: Uric Acid, Serum: 7.8 mg/dL — ABNORMAL HIGH (ref 2.4–7.0)

## 2010-08-09 ENCOUNTER — Encounter: Payer: Self-pay | Admitting: Family Medicine

## 2010-08-09 ENCOUNTER — Encounter (INDEPENDENT_AMBULATORY_CARE_PROVIDER_SITE_OTHER): Payer: BC Managed Care – PPO | Admitting: Family Medicine

## 2010-08-09 ENCOUNTER — Other Ambulatory Visit: Payer: Self-pay | Admitting: Family Medicine

## 2010-08-09 ENCOUNTER — Other Ambulatory Visit (HOSPITAL_COMMUNITY)
Admission: RE | Admit: 2010-08-09 | Discharge: 2010-08-09 | Disposition: A | Discharge status: Home / Self Care | Payer: BC Managed Care – PPO | Source: Ambulatory Visit | Attending: Family Medicine | Admitting: Family Medicine

## 2010-08-09 DIAGNOSIS — R7309 Other abnormal glucose: Secondary | ICD-10-CM

## 2010-08-09 DIAGNOSIS — Z124 Encounter for screening for malignant neoplasm of cervix: Secondary | ICD-10-CM | POA: Insufficient documentation

## 2010-08-09 DIAGNOSIS — Z23 Encounter for immunization: Secondary | ICD-10-CM

## 2010-08-09 DIAGNOSIS — K5730 Diverticulosis of large intestine without perforation or abscess without bleeding: Secondary | ICD-10-CM

## 2010-08-09 DIAGNOSIS — E039 Hypothyroidism, unspecified: Secondary | ICD-10-CM

## 2010-08-09 DIAGNOSIS — M109 Gout, unspecified: Secondary | ICD-10-CM

## 2010-08-09 DIAGNOSIS — Z0000 Encounter for general adult medical examination without abnormal findings: Secondary | ICD-10-CM

## 2010-08-15 ENCOUNTER — Encounter (INDEPENDENT_AMBULATORY_CARE_PROVIDER_SITE_OTHER): Payer: Self-pay | Admitting: *Deleted

## 2010-08-17 ENCOUNTER — Encounter (INDEPENDENT_AMBULATORY_CARE_PROVIDER_SITE_OTHER): Payer: Self-pay | Admitting: *Deleted

## 2010-08-17 ENCOUNTER — Other Ambulatory Visit: Payer: Self-pay | Admitting: Family Medicine

## 2010-08-17 ENCOUNTER — Other Ambulatory Visit: Payer: BC Managed Care – PPO

## 2010-08-17 DIAGNOSIS — Z0000 Encounter for general adult medical examination without abnormal findings: Secondary | ICD-10-CM

## 2010-08-17 LAB — FECAL OCCULT BLOOD, IMMUNOCHEMICAL: Fecal Occult Bld: NEGATIVE

## 2010-08-17 NOTE — Letter (Signed)
Summary: [facility]: Immunoassay Fecal Occult Blood (iFOB) Order Form  [place] [facility]  [address] [place], [place] [address]   Phone: [phone]  Fax: [phone]      [facility]: Immunoassay Fecal Occult Blood (iFOB) Order Form   August 09, 2010 MRN: [id number]   Mercedes [name] Dec 09, 1945   Physicican Name:______Dr Schaller_________________  Diagnosis Code:_________v70.0_________________      Mercedes Leeks MD

## 2010-08-17 NOTE — Letter (Signed)
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

## 2010-08-17 NOTE — Assessment & Plan Note (Signed)
Summary: CPX/ CLE   Vital Signs:  Patient profile:   65 year old female Weight:      184 pounds BMI:     31.70 Temp:     97.5 degrees F oral Pulse rate:   80 / minute Pulse rhythm:   regular BP sitting:   148 / 86  (left arm) Cuff size:   large  Vitals Entered By: Sydell Axon LPN (August 09, 2010 2:58 PM) CC: 30 Minute checkup   History of Present Illness: Pt here for Comp Exam. She is feeling well and has no complaints. She just got back from [place] with grandchildren and son and daughter in Social worker. Was quite noncompliant on the trip. Quit smoking 07/03/2010!!! Congrats!! Of course has worsened her appetite and weight gain.  Preventive Screening-Counseling & Management  Alcohol-Tobacco     Alcohol drinks/day: <1     Alcohol type: wine      Smoking Status: quit < 6 months     Smoking Cessation Counseling: yes     Packs/Day: 0.5     Year Started: 1967, quit with kids and restarted.     Year Quit: 07/04/2010     Pack years: 5  Caffeine-Diet-Exercise     Caffeine use/day: 1     Does Patient Exercise: yes     Type of exercise: walking     Exercise (avg: min/session):     Times/week: <3  Problems Prior to Update: 1)  Screening For Malignannt Neoplasm, Site Nec  (ICD-V76.49) 2)  Health Maintenance Exam  (ICD-V70.0) 3)  Cholelithiasis  (ICD-574.20) 4)  Hyperglycemia  (ICD-790.29) 5)  Disorder, Tobacco Use  (ICD-305.1) 6)  Menopausal Syndrome  (ICD-627.2) 7)  Osteoarthritis  (ICD-715.90) 8)  Diverticulosis, Colon  (ICD-562.10) 9)  Hypothyroidism Nos  (ICD-244.9) 10)  Gout Nos  (ICD-274.9) 11)  Hyperlipidemia Nec/nos  (ICD-272.4) 12)  Hypertension, Benign Essential  (ICD-401.1)  Medications Prior to Update: 1)  Colchicine 0.6 Mg Tabs (Colchicine) .... Take 1 Tablet By Mouth Twice A Day As Needed 2)  Levothyroxine Sodium 175 Mcg Tabs (Levothyroxine Sodium) .... One Tab By Mouth Daily 3)  Toprol Xl 200 Mg  Tb24 (Metoprolol Succinate) .Marland Kitchen.. 1 By Mouth Once  Daily 4)  Centrum Silver   Tabs (Multiple Vitamins-Minerals) .... Once Daily 5)  Caltrate 600 1500 Mg  Tabs (Calcium Carbonate) .... Once Daily 6)  Lipitor 20 Mg  Tabs (Atorvastatin Calcium) .... Take One By Mouth Daily  Current Medications (verified): 1)  Colchicine 0.6 Mg Tabs (Colchicine) .... Take 1 Tablet By Mouth Twice A Day As Needed 2)  Levothyroxine Sodium 175 Mcg Tabs (Levothyroxine Sodium) .... One Tab By Mouth Daily 3)  Toprol Xl 200 Mg  Tb24 (Metoprolol Succinate) .Marland Kitchen.. 1 By Mouth Once Daily 4)  Centrum Silver   Tabs (Multiple Vitamins-Minerals) .... Once Daily 5)  Caltrate 600 1500 Mg  Tabs (Calcium Carbonate) .... Once Daily 6)  Lipitor 20 Mg  Tabs (Atorvastatin Calcium) .... Take One By Mouth Daily  Allergies: 1)  ! Penicillin V Potassium (Penicillin V Potassium) 2)  ! Tetracycline Hcl (Tetracycline Hcl) 3)  ! Sulfasalazine (Sulfasalazine) 4)  ! Biaxin (Clarithromycin) 5)  ! * Aces 6)  ! Tricor (Fenofibrate) 7)  ! Slo-Niacin (Niacin)  Past History:  Past Medical History: Last updated: 03/09/2008 Diverticulosis, colon Gout Hyperlipidemia Hypertension Hypothyroidism Osteoarthritis tab abuse   Past Surgical History: Last updated: 01/14/2007             NSVD x  2 9/99      Flex/Wood nml. 3/02      DEXA nml. 07/2005  CT abdomen and pelvis  Family History: Last updated: July 29, 2008 Father: Died at 54 12/02/1982) ulcer surgery, surgical error         Nov 21, 2022 Mother: Died 58, angina, ? 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, [employer]  Risk Factors: Alcohol Use: <1 (08/09/2010) Caffeine Use: 1 (08/09/2010) Exercise: yes (08/09/2010)  Risk Factors: Smoking Status: quit < 6 months (08/09/2010) Packs/Day: 0.5 (08/09/2010)  Social History: Quit 07/04/2010 5 pyh Current Smoker, 1/2 PPD, started  ~18 YOA, quit 10 years, re-started  ~ 1 year ago Alcohol use-yes, wine frequently Drug use-no Marital Status: Married, lives with husband Children: 2 sons, 1 "tenure as Consulting civil engineer" - Journalism/Communication 20 YOA Occupation: Retired Runner, 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 Eyes:  Denies blurring, discharge, and eye pain. ENT:  Denies decreased hearing, earache, and ringing in ears. CV:  Denies chest pain or discomfort, fainting, fatigue, palpitations, shortness of breath with exertion, swelling of feet, and swelling of hands. Resp:  Denies cough, shortness of breath, and wheezing. GI:  Complains of gas and indigestion; denies abdominal pain, bloody stools, change in bowel habits, constipation, dark tarry stools, diarrhea, loss of appetite, nausea, vomiting, vomiting blood, and yellowish skin color; occas both. GU:  Denies discharge, dysuria, nocturia, and urinary frequency. MS:  Complains of joint pain and cramps; denies low back pain, muscle aches, and stiffness; occas. Derm:  Complains of dryness; denies itching and rash. Neuro:  Denies numbness, poor balance, tingling, and tremors.  Physical Exam  General:  Well-developed,well-nourished,in no acute distress; alert,appropriate and cooperative throughout examination Head:  Normocephalic and atraumatic without obvious abnormalities.   Sinuses NT. Eyes:  Conjunctiva clear bilaterally.  Ears:  External ear exam shows no significant lesions or deformities.  Otoscopic examination reveals clear canals, tympanic membranes are intact bilaterally without bulging, retraction, inflammation or discharge. Hearing is grossly normal bilaterally. Nose:  External nasal examination shows no deformity or inflammation. Nasal mucosa are pink and moist without lesions or exudates. Mouth:  MMM Neck:  No deformities, masses, or tenderness noted. Chest Wall:  No deformities, masses, or tenderness noted. Breasts:  No mass, nodules, thickening, tenderness, bulging, retraction, inflamation, nipple discharge or skin changes noted.   Lungs:  Normal respiratory effort, chest expands symmetrically. Lungs are clear to auscultation, no crackles or wheezes. Heart:  Normal rate and regular rhythm. S1 and S2 normal without gallop, murmur, click, rub or other extra sounds. Abdomen:  Bowel sounds positive,abdomen soft and non-tender without masses, organomegaly or hernias noted. Rectal:  No external abnormalities noted. Normal sphincter tone. No rectal masses or tenderness. G neg. Genitalia:  Pelvic Exam:  External: normal female genitalia without lesions or masses        Vagina: normal without lesions or masses        Cervix: normal without lesions or masses        Adnexa: normal bimanual exam without masses or fullness        Uterus: normal by palpation        Pap smear: performed Msk:  No deformity or scoliosis noted of thoracic or lumbar spine.   Pulses:  R and L carotid,radial,femoral,dorsalis pedis and posterior tibial pulses are full and equal bilaterally Extremities:  No clubbing, cyanosis, edema, or deformity noted with normal full range of motion of all joints.   Neurologic:  No cranial nerve deficits noted. Station and gait are normal. Sensory, motor and coordinative functions appear intact. Skin:  Hypertrophied nodule behind right ear,  approx 1cmx51mm. Cervical Nodes:  No lymphadenopathy noted Axillary Nodes:  No palpable lymphadenopathy Inguinal Nodes:  No significant adenopathy Psych:  Cognition and judgment appear intact. Alert and cooperative with normal attention span and concentration. No apparent delusions, illusions, hallucinations   Impression & Recommendations:  Problem # 1:  HEALTH MAINTENANCE EXAM (ICD-V70.0) I have personally reviewed the Medicare Annual Wellness questionnaire and have noted 1.   The patient's medical and social history 2.   Their use of alcohol, tobacco or illicit drugs 3.   Their current medications and supplements 4.   The patient's functional ability including ADL's, fall risks, home safety risks and hearing or visual             impairment. 5.   Diet and physical activities 6.   Evidence for depression or mood disorders  No risk factors for DEXA except age. Given Pneumonia shot here and discussed checking into Zostavax and administration at pharmacy.  Discussed flu shot and will not give now.  Had flex sig 1999. Will get Colonoscopy in the future. No known h/o colon ca or polyps in the family, no blood in stool.  Problem # 2:  HYPERGLYCEMIA (ICD-790.29) Assessment: Unchanged Stable nos. Cont avoiding sweets and carbs. Labs Reviewed: Creat: 0.9 (08/07/2010)     Problem # 3:  DISORDER, TOBACCO USE (ICD-305.1) Great job stopping. Weight has caught up with her however. Encouraged to lose.  Problem # 4:  MENOPAUSAL SYNDROME (ICD-627.2) Assessment: Unchanged  AStable, sxs bearable.  Problem # 5:  DIVERTICULOSIS, COLON (ICD-562.10) Assessment: Unchanged  Encouraged to come in for prolonged LLQ discomfort.  Labs Reviewed: Hgb: 16.0 (08/07/2010)   Hct: 45.3 (08/07/2010)   WBC: 7.0 (08/07/2010)  Problem # 6:  HYPOTHYROIDISM NOS (ICD-244.9) Assessment: Improved Sxs better and labs within parameters. Cont curr dose. Her updated medication list for this problem includes:     Levothyroxine Sodium 175 Mcg Tabs (Levothyroxine sodium) ..... One tab by mouth daily  Problem # 7:  GOUT NOS (ICD-274.9) Sxs rare so will not treat. Her updated medication list for this problem includes:    Colchicine 0.6 Mg Tabs (Colchicine) .Marland Kitchen... Take 1 tablet by mouth twice a day as needed  Elevate extremity; warm compresses, symptomatic relief and medication as directed.   Problem # 8:  HYPERLIPIDEMIA NEC/NOS (ICD-272.4) Assessment: Deteriorated Trigs up from a trip to [place]. Discussed. Her updated medication list for this problem includes:    Lipitor 20 Mg Tabs (Atorvastatin calcium) .Marland Kitchen... Take one by mouth daily  Labs Reviewed: SGOT: 27 (08/07/2010)   SGPT: 28 (08/07/2010)   HDL:45.20 (08/07/2010), 44 (07/16/2009)  LDL:81 (07/16/2009), DEL (16/04/9603)  Chol:164 (08/07/2010), 153 (07/16/2009)  Trig:298.0 (08/07/2010), 138 (07/16/2009)  Problem # 9:  HYPERTENSION, BENIGN ESSENTIAL (ICD-401.1) Assessment: Deteriorated 14 pounds heavier....lose weight and will probably normalize. Encouraged. Her updated medication list for this problem includes:    Toprol Xl 200 Mg Tb24 (Metoprolol succinate) .Marland Kitchen... 1 by mouth once daily  BP today: 148/86 Prior BP: 120/76 (10/18/2009)  Labs Reviewed: K+: 4.9 (08/07/2010) Creat: : 0.9 (08/07/2010)   Chol: 164 (08/07/2010)   HDL: 45.20 (08/07/2010)   LDL: 81 (07/16/2009)   TG: 298.0 (08/07/2010)  Complete Medication List: 1)  Colchicine 0.6 Mg Tabs (Colchicine) .... Take 1 tablet by mouth twice a day as needed 2)  Levothyroxine Sodium 175 Mcg Tabs (Levothyroxine sodium) .... One tab by mouth daily 3)  Toprol Xl 200 Mg Tb24 (Metoprolol succinate) .Marland Kitchen.. 1 by mouth once daily 4)  Centrum Silver Tabs (Multiple vitamins-minerals) .... Once daily 5)  Caltrate 600 1500 Mg Tabs (Calcium carbonate) .... Once daily 6)  Lipitor 20 Mg Tabs (Atorvastatin calcium) .... Take one by mouth daily  Other Orders: Pneumococcal Vaccine ([address]) Admin 1st  Vaccine (52841)  Patient Instructions: 1)  RTC 3 mos for BP recheck due to weight. 2)  Discuss colonoscopy. Prescriptions: COLCHICINE 0.6 MG TABS (COLCHICINE) Take 1 tablet by mouth twice a day as needed  #60 x 0   Entered and Authorized by:   Shaune Leeks MD   Signed by:   Shaune Leeks MD on 08/09/2010   Method used:   Electronically to        [facility] [phone]* (retail)       [address]       [place], [place]  [address]       Ph: [phone]       Fax: [phone]   RxID:   [id number] LIPITOR 20 MG  TABS (ATORVASTATIN CALCIUM) Take one by mouth daily  #90 x 3   Entered and Authorized by:   Shaune Leeks MD   Signed by:   Shaune Leeks MD on 08/09/2010   Method used:   [facility]ronically to        [facility]       [address]       [place], [place]  [address]       Ph: [phone]       Fax: [phone]   RxID:   [id number] TOPROL XL 200 MG  TB24 (METOPROLOL SUCCINATE) 1 by mouth once daily  #90 x 3   Entered and Authorized by:   Shaune Leeks MD   Signed by:   Shaune Leeks MD on 08/09/2010   Method used:   Electronically to        [facility]       [address]       [place], [place]  [address]       Ph: [phone]       Fax: [phone]   RxID:   [id number] LEVOTHYROXINE SODIUM 175 MCG TABS (LEVOTHYROXINE SODIUM) one tab by mouth daily  #90 x 3   Entered and Authorized by:   Shaune Leeks MD   Signed by:   Shaune Leeks MD on 08/09/2010   Method used:   [facility]ronically to        [facility]       [address] [place] [address]  [place], [place]  [address]       Ph: [phone]       Fax: [phone]   RxID:   [id number]    Orders Added: 1)  Pneumococcal Vaccine [90732] 2)  Admin 1st Vaccine [90471] 3)  Est. Patient 65& > [16967]   Immunizations Administered:  Pneumonia Vaccine:     Vaccine Type: Pneumovax    Site: right deltoid    Mfr: Merck    Dose: 0.5 ml    Route: IM    Given by: Sydell Axon LPN    Exp. Date: 11/24/2011    Lot #: [id number]    VIS given: 06/06/09 version given August 09, 2010.   Immunizations Administered:  Pneumonia Vaccine:    Vaccine Type: Pneumovax    Site: right deltoid    Mfr: Merck    Dose: 0.5 ml    Route: IM    Given by: Sydell Axon LPN    Exp. Date: 11/24/2011    Lot #: [id number]    VIS given: 06/06/09 version given August 09, 2010.  Current Allergies (reviewed today): ! PENICILLIN V POTASSIUM (PENICILLIN V POTASSIUM) ! TETRACYCLINE HCL (TETRACYCLINE HCL) ! SULFASALAZINE (SULFASALAZINE) ! BIAXIN (CLARITHROMYCIN) ! * ACES ! TRICOR (FENOFIBRATE) ! SLO-NIACIN (NIACIN)

## 2010-08-23 NOTE — Letter (Signed)
Summary: Results Follow up Letter  [place] at [facility]  [address] [place], [place] [address]   Phone: [phone]  Fax: [phone]    08/15/2010 MRN: [id number]    [facility] Mercedes [name] [address] [place], [place]  [address]  [place]    Dear Mercedes [name],  The following are the results of your recent test(s):  Test         Result    Pap Smear:        Normal __X___  Not Normal _____ Comments:  Please repeat in one year. ______________________________________________________ Cholesterol: LDL(Bad cholesterol):         Your goal is less than:         HDL (Good cholesterol):       Your goal is more than: Comments:  ______________________________________________________ Mammogram:        Normal _____  Not Normal _____ Comments:  ___________________________________________________________________ Hemoccult:        Normal _____  Not normal _______ Comments:    _____________________________________________________________________ Other Tests:    We routinely do not discuss normal results over the telephone.  If you desire a copy of the results, or you have any questions about this information we can discuss them at your next office visit.   Sincerely,    Laurita Quint, MD

## 2010-08-23 NOTE — Letter (Signed)
Summary: Results Follow up Letter  [place] at [facility]  [address] [place], [place] [address]   Phone: [phone]  Fax: [phone]    08/17/2010 MRN: [id number]    [facility] [address] [place], [place]  [address]  [place]    Dear Ms. Julson,  The following are the results of your recent test(s):  Test         Result    Pap Smear:        Normal _____  Not Normal _____ Comments: ______________________________________________________ Cholesterol: LDL(Bad cholesterol):         Your goal is less than:         HDL (Good cholesterol):       Your goal is more than: Comments:  ______________________________________________________ Mammogram:        Normal _____  Not Normal _____ Comments:  ___________________________________________________________________ Hemoccult:        Normal ___X__  Not normal _______ Comments:  Please repeat in one year.  _____________________________________________________________________ Other Tests:    We routinely do not discuss normal results over the telephone.  If you desire a copy of the results, or you have any questions about this information we can discuss them at your next office visit.   Sincerely,    Laurita Quint, MD

## 2010-09-09 ENCOUNTER — Encounter: Payer: Self-pay | Admitting: Family Medicine

## 2010-09-09 DIAGNOSIS — I10 Essential (primary) hypertension: Secondary | ICD-10-CM

## 2010-09-09 DIAGNOSIS — E039 Hypothyroidism, unspecified: Secondary | ICD-10-CM

## 2010-09-09 DIAGNOSIS — M109 Gout, unspecified: Secondary | ICD-10-CM | POA: Insufficient documentation

## 2010-09-09 DIAGNOSIS — K5730 Diverticulosis of large intestine without perforation or abscess without bleeding: Secondary | ICD-10-CM

## 2010-09-09 DIAGNOSIS — Z720 Tobacco use: Secondary | ICD-10-CM

## 2010-09-09 DIAGNOSIS — E785 Hyperlipidemia, unspecified: Secondary | ICD-10-CM

## 2010-09-09 DIAGNOSIS — M1990 Unspecified osteoarthritis, unspecified site: Secondary | ICD-10-CM

## 2010-10-05 ENCOUNTER — Other Ambulatory Visit: Payer: Self-pay | Admitting: *Deleted

## 2010-10-05 MED ORDER — METOPROLOL SUCCINATE ER 200 MG PO TB24: 200.0000 mg | ORAL_TABLET | Freq: Every day | ORAL | Status: DC

## 2010-11-03 ENCOUNTER — Encounter: Payer: Self-pay | Admitting: Family Medicine

## 2010-11-03 ENCOUNTER — Ambulatory Visit (INDEPENDENT_AMBULATORY_CARE_PROVIDER_SITE_OTHER): Payer: BC Managed Care – PPO | Admitting: Family Medicine

## 2010-11-03 ENCOUNTER — Other Ambulatory Visit: Payer: Self-pay | Admitting: *Deleted

## 2010-11-03 VITALS — BP 142/84 | HR 84 | Temp 99.3°F | Wt 186.1 lb

## 2010-11-03 DIAGNOSIS — J069 Acute upper respiratory infection, unspecified: Secondary | ICD-10-CM | POA: Insufficient documentation

## 2010-11-03 DIAGNOSIS — J029 Acute pharyngitis, unspecified: Secondary | ICD-10-CM

## 2010-11-03 LAB — POCT RAPID STREP A (OFFICE): Rapid Strep A Screen: NEGATIVE

## 2010-11-03 MED ORDER — LEVOTHYROXINE SODIUM 175 MCG PO TABS: 175.0000 ug | ORAL_TABLET | Freq: Every day | ORAL | Status: DC

## 2010-11-03 MED ORDER — BENZONATATE 200 MG PO CAPS: 200.0000 mg | ORAL_CAPSULE | Freq: Three times a day (TID) | ORAL | Status: AC | PRN

## 2010-11-03 MED ORDER — LIDOCAINE VISCOUS 2 % MT SOLN: 5.0000 mL | Freq: Four times a day (QID) | OROMUCOSAL | Status: AC | PRN

## 2010-11-03 NOTE — Progress Notes (Signed)
duration of symptoms: ~ 4-5 days rhinorrhea:yes congestion:yes ear pain:R ear pain sore throat:yes Cough:yes, dry myalgias:yes other concerns: fever up to 100.6, some relief with tylenol Mult sick contacts  ROS: See HPI.  Otherwise negative.    Meds, vitals, and allergies reviewed.   GEN: nad, alert and oriented HEENT: mucous membranes moist, tm w/o erythema, nasal exam w/ mild erythema, clear discharge noted,  OP with cobblestoning but no exudates NECK: supple w/o LA CV: rrr.   PULM: ctab, no inc wob EXT: no edema SKIN: no acute rash  RST negative.

## 2010-11-03 NOTE — Patient Instructions (Signed)
Gargle with warm salt water as needed for your sore throat.  Use the lidocaine as needed if the salt water doesn't help.  Take the tessalon for cough as needed and try to get some rest.  Take care.

## 2010-11-05 ENCOUNTER — Encounter: Payer: Self-pay | Admitting: Family Medicine

## 2010-11-05 NOTE — Assessment & Plan Note (Signed)
Likely viral.  RST negative and okay for outpatient fu.  Supportive tx.  She likely had the same illness as her husband.  He's about 1 day ahead of her in terms of duration and feeling better.  See instructions.

## 2010-11-08 ENCOUNTER — Encounter: Payer: Self-pay | Admitting: Family Medicine

## 2010-11-08 ENCOUNTER — Ambulatory Visit (INDEPENDENT_AMBULATORY_CARE_PROVIDER_SITE_OTHER): Payer: BC Managed Care – PPO | Admitting: Family Medicine

## 2010-11-08 DIAGNOSIS — Z0000 Encounter for general adult medical examination without abnormal findings: Secondary | ICD-10-CM

## 2010-11-08 DIAGNOSIS — E669 Obesity, unspecified: Secondary | ICD-10-CM

## 2010-11-08 DIAGNOSIS — I10 Essential (primary) hypertension: Secondary | ICD-10-CM

## 2010-11-08 DIAGNOSIS — E039 Hypothyroidism, unspecified: Secondary | ICD-10-CM

## 2010-11-08 DIAGNOSIS — J069 Acute upper respiratory infection, unspecified: Secondary | ICD-10-CM

## 2010-11-08 MED ORDER — LEVOTHYROXINE SODIUM 200 MCG PO TABS: 200.0000 ug | ORAL_TABLET | Freq: Every day | ORAL | Status: DC

## 2010-11-08 NOTE — Patient Instructions (Signed)
Continue to do Ifob, last the beginning of this year.

## 2010-11-08 NOTE — Assessment & Plan Note (Signed)
Will try stimulating by increasing Levothyroxine for 6 mos. Her dose of replacement is somewhere between 175 and .

## 2010-11-08 NOTE — Assessment & Plan Note (Signed)
Essentially resolved. 

## 2010-11-08 NOTE — Assessment & Plan Note (Addendum)
Adequate today. Cont as is. Will cont to follow. BP Readings from Last 3 Encounters:  11/08/10 130/78  11/03/10 142/84  08/09/10 148/86

## 2010-11-08 NOTE — Assessment & Plan Note (Signed)
Will increase Medication for 6 mos.

## 2010-11-08 NOTE — Progress Notes (Signed)
  Subjective:    Patient ID: Mercedes [name], female    DOB: Feb 27, 1946, 65 y.o.   MRN: [id number]  HPI Pt here for followup of elevated BP and weight. She has been watching what she eats and limits as much as poss to proteins and veggies. She quit smoking last year and weight has been an issue since. She was seen last Fri for congestion, felt to be virral and then went to [facility] Sat for gooey eyes and was treated for conjuctivitis and given eye drops and Zithro. She does not know why the Zithro, which she did take. She is much better, eyes essentially back to nml. She would like some stimulation to try to get weight loss jump started. We discussed metabolic rate and she asked about thyroid hormone increase. She has fluctuated between 175 and 200 mcg for replacement and is currently on 175. She would like to increase. We also spent time discussing colon cancer screening. She had a flex sig many years ago for mere screening purposes. She has no risk factors for colon cancer. FH neg. No blood per rectum, no abd complaints.    Review of Systems  Constitutional: Negative for fever, chills, diaphoresis, activity change and fatigue.  HENT: Negative for ear pain, congestion, rhinorrhea and postnasal drip.   Eyes: Negative for redness.  Respiratory: Negative for cough, chest tightness, shortness of breath and wheezing.   Cardiovascular: Negative for chest pain.       Objective:   Physical Exam  Constitutional: She appears well-developed and well-nourished. No distress.  HENT:  Head: Normocephalic and atraumatic.  Right Ear: External ear normal.  Left Ear: External ear normal.  Nose: Nose normal.  Mouth/Throat: Oropharynx is clear and moist. No oropharyngeal exudate.  Eyes: Conjunctivae and EOM are normal. Pupils are equal, round, and reactive to light.  Neck: Normal range of motion. Neck supple. No thyromegaly present.  Cardiovascular: Normal rate, regular rhythm and normal heart sounds.     Pulmonary/Chest: Effort normal and breath sounds normal. She has no wheezes. She has no rales.  Lymphadenopathy:    She has no cervical adenopathy.  Skin: She is not diaphoretic.          Assessment & Plan:

## 2010-12-12 ENCOUNTER — Other Ambulatory Visit: Payer: Self-pay | Admitting: *Deleted

## 2010-12-12 MED ORDER — METOPROLOL SUCCINATE ER 200 MG PO TB24: 200.0000 mg | ORAL_TABLET | Freq: Every day | ORAL | Status: DC

## 2010-12-14 ENCOUNTER — Other Ambulatory Visit: Payer: Self-pay | Admitting: *Deleted

## 2010-12-14 MED ORDER — ATORVASTATIN CALCIUM 20 MG PO TABS: 20.0000 mg | ORAL_TABLET | Freq: Every day | ORAL | Status: DC

## 2011-03-06 ENCOUNTER — Telehealth: Payer: Self-pay | Admitting: *Deleted

## 2011-03-06 NOTE — Telephone Encounter (Signed)
Received a form from [facility] to be completed to verify medication refill of Toprol. Form needs to be completed and faxed back. Form is in your in box.

## 2011-03-07 NOTE — Telephone Encounter (Signed)
Signed, Toprol XL 200mg  once daily, 90/3.

## 2011-03-07 NOTE — Telephone Encounter (Signed)
Completed form faxed back.

## 2011-03-12 LAB — HM MAMMOGRAPHY: HM Mammogram: NEGATIVE

## 2011-03-15 ENCOUNTER — Encounter: Payer: Self-pay | Admitting: Family Medicine

## 2011-03-22 ENCOUNTER — Encounter: Payer: Self-pay | Admitting: *Deleted

## 2011-03-22 ENCOUNTER — Encounter: Payer: Self-pay | Admitting: Family Medicine

## 2011-04-30 ENCOUNTER — Other Ambulatory Visit: Payer: Self-pay | Admitting: Family Medicine

## 2011-04-30 NOTE — Telephone Encounter (Signed)
Spoke to patient and was advised that she is on Synthroid 200 mcg daily. Refill sent to pharmacy.

## 2011-06-07 MED ORDER — LIDOCAINE HCL (PF) 1 % IJ SOLN
INTRAMUSCULAR | Status: AC
  Filled 2011-06-07: qty 60

## 2011-08-31 ENCOUNTER — Ambulatory Visit (INDEPENDENT_AMBULATORY_CARE_PROVIDER_SITE_OTHER): Payer: BC Managed Care – PPO | Admitting: Family Medicine

## 2011-08-31 ENCOUNTER — Encounter: Payer: Self-pay | Admitting: Family Medicine

## 2011-08-31 VITALS — BP 136/78 | HR 84 | Temp 98.2°F | Wt 202.0 lb

## 2011-08-31 DIAGNOSIS — Z13820 Encounter for screening for osteoporosis: Secondary | ICD-10-CM

## 2011-08-31 DIAGNOSIS — E785 Hyperlipidemia, unspecified: Secondary | ICD-10-CM

## 2011-08-31 DIAGNOSIS — F17200 Nicotine dependence, unspecified, uncomplicated: Secondary | ICD-10-CM

## 2011-08-31 DIAGNOSIS — R7309 Other abnormal glucose: Secondary | ICD-10-CM

## 2011-08-31 DIAGNOSIS — M109 Gout, unspecified: Secondary | ICD-10-CM

## 2011-08-31 DIAGNOSIS — E039 Hypothyroidism, unspecified: Secondary | ICD-10-CM

## 2011-08-31 DIAGNOSIS — Z780 Asymptomatic menopausal state: Secondary | ICD-10-CM

## 2011-08-31 DIAGNOSIS — I10 Essential (primary) hypertension: Secondary | ICD-10-CM

## 2011-08-31 DIAGNOSIS — Z1211 Encounter for screening for malignant neoplasm of colon: Secondary | ICD-10-CM

## 2011-08-31 LAB — LIPID PANEL
Cholesterol: 159 mg/dL (ref 0–200)
HDL: 43.3 mg/dL (ref >39.00)
LDL Cholesterol: 76 mg/dL (ref 0–99)
Triglycerides: 199 mg/dL — ABNORMAL HIGH (ref 0.0–149.0)
VLDL: 39.8 mg/dL (ref 0.0–40.0)

## 2011-08-31 LAB — COMPREHENSIVE METABOLIC PANEL
ALT: 56 U/L — ABNORMAL HIGH (ref 0–35)
AST: 37 U/L (ref 0–37)
Creatinine, Ser: 0.9 mg/dL (ref 0.4–1.2)
Total Bilirubin: 1.5 mg/dL — ABNORMAL HIGH (ref 0.3–1.2)

## 2011-08-31 NOTE — Progress Notes (Signed)
She had a cough so she quit smoking.  Cough immediately improved.    Hypertension:    Using medication without problems or lightheadedness: yes Chest pain with exertion:no Edema:no Short of breath:no We talked about exercise.  She'll work work on this.    Elevated Cholesterol: Using medications without problems:yes Muscle aches: no Diet compliance: "terrible" after stopping smoking, but she's working on this.   Exercise:no  Hypothyroid.  On meds.  No ADE.  Due for lab.  No neck mass. No dysphagia.    Papule behind R pinna, prev enlarged then stable in size for years.   Meds, vitals, and allergies reviewed.   ROS: See HPI.  Otherwise, noncontributory.  GEN: nad, alert and oriented HEENT: mucous membranes moist, ~1cm fleshy papule w/o ulceration behind R pina, TM and canals wnl o/w NECK: supple w/o LA CV: rrr.  PULM: ctab, no inc wob ABD: soft, +bs EXT: no edema SKIN: no acute rash

## 2011-08-31 NOTE — Patient Instructions (Signed)
See Shirlee Limerick about your referral before you leave today. You can get your results through our phone system.  Follow the instructions on the blue card. Take care.  Glad to see you.

## 2011-09-01 ENCOUNTER — Other Ambulatory Visit: Payer: Self-pay | Admitting: Family Medicine

## 2011-09-02 DIAGNOSIS — Z1211 Encounter for screening for malignant neoplasm of colon: Secondary | ICD-10-CM | POA: Insufficient documentation

## 2011-09-02 DIAGNOSIS — Z13820 Encounter for screening for osteoporosis: Secondary | ICD-10-CM | POA: Insufficient documentation

## 2011-09-02 MED ORDER — COLCHICINE 0.6 MG PO TABS: 0.6000 mg | ORAL_TABLET | Freq: Two times a day (BID) | ORAL | Status: DC | PRN

## 2011-09-02 MED ORDER — ATORVASTATIN CALCIUM 20 MG PO TABS: 20.0000 mg | ORAL_TABLET | Freq: Every day | ORAL | Status: DC

## 2011-09-02 MED ORDER — LEVOTHYROXINE SODIUM 200 MCG PO TABS: 200.0000 ug | ORAL_TABLET | Freq: Every day | ORAL | Status: DC

## 2011-09-02 MED ORDER — METOPROLOL SUCCINATE ER 200 MG PO TB24: 200.0000 mg | ORAL_TABLET | Freq: Every day | ORAL | Status: DC

## 2011-09-02 NOTE — Assessment & Plan Note (Signed)
Continue meds, work on diet, weight, exercise.  

## 2011-09-02 NOTE — Assessment & Plan Note (Signed)
Controlled, continue current meds.   

## 2011-09-02 NOTE — Assessment & Plan Note (Signed)
Continue meds, work on diet, weight, exercise.

## 2011-09-02 NOTE — Assessment & Plan Note (Signed)
Resolved

## 2011-09-02 NOTE — Assessment & Plan Note (Signed)
D/w patient re:options for colon cancer screening, including IFOB vs. colonoscopy.  Risks and benefits of both were discussed and patient voiced understanding.  Pt elects for:IFOB  

## 2011-09-02 NOTE — Assessment & Plan Note (Signed)
Refer for DXA.  

## 2011-09-02 NOTE — Assessment & Plan Note (Signed)
Continue meds. TSH okay.

## 2011-09-02 NOTE — Assessment & Plan Note (Signed)
No meds, work on diet, weight, exercise.

## 2011-09-10 ENCOUNTER — Encounter: Payer: Self-pay | Admitting: Family Medicine

## 2011-09-11 ENCOUNTER — Encounter: Payer: Self-pay | Admitting: Family Medicine

## 2011-09-11 ENCOUNTER — Encounter: Payer: Self-pay | Admitting: *Deleted

## 2012-02-02 ENCOUNTER — Other Ambulatory Visit: Payer: Self-pay | Admitting: Family Medicine

## 2012-03-13 ENCOUNTER — Encounter: Payer: Self-pay | Admitting: Family Medicine

## 2012-03-14 ENCOUNTER — Encounter: Payer: Self-pay | Admitting: Family Medicine

## 2012-07-09 ENCOUNTER — Encounter: Payer: Self-pay | Admitting: Family Medicine

## 2012-07-09 ENCOUNTER — Ambulatory Visit (INDEPENDENT_AMBULATORY_CARE_PROVIDER_SITE_OTHER)
Admission: RE | Admit: 2012-07-09 | Discharge: 2012-07-09 | Disposition: A | Discharge status: Home / Self Care | Payer: BC Managed Care – PPO | Source: Ambulatory Visit | Attending: Family Medicine | Admitting: Family Medicine

## 2012-07-09 ENCOUNTER — Ambulatory Visit (INDEPENDENT_AMBULATORY_CARE_PROVIDER_SITE_OTHER): Payer: BC Managed Care – PPO | Admitting: Family Medicine

## 2012-07-09 VITALS — BP 140/80 | HR 68 | Temp 98.0°F | Wt 214.8 lb

## 2012-07-09 DIAGNOSIS — M25569 Pain in unspecified knee: Secondary | ICD-10-CM

## 2012-07-09 DIAGNOSIS — E785 Hyperlipidemia, unspecified: Secondary | ICD-10-CM

## 2012-07-09 DIAGNOSIS — M79609 Pain in unspecified limb: Secondary | ICD-10-CM

## 2012-07-09 DIAGNOSIS — M79606 Pain in leg, unspecified: Secondary | ICD-10-CM | POA: Insufficient documentation

## 2012-07-09 MED ORDER — HYDROCODONE-ACETAMINOPHEN 5-325 MG PO TABS: 1.0000 | ORAL_TABLET | Freq: Four times a day (QID) | ORAL | Status: DC | PRN

## 2012-07-09 NOTE — Assessment & Plan Note (Signed)
Xray neg, improved from yesterday, weight bearing as tolerated and use ibuprofen/vicodin for pain.  GI/sedation caution.  It is likely that she has a resolving strain that will take some time to return to baseline.  She agrees.

## 2012-07-09 NOTE — Patient Instructions (Signed)
Take ibuprofen 200mg /tab, 2-3 tabs 2-3 times per day with food.  If still in pain, use the hydrocodone pills.  Sedation caution.  Weight bearing as tolerated, off load with the walker as needed.  If not improving, then notify me next week.  Take care.

## 2012-07-09 NOTE — Assessment & Plan Note (Signed)
She'll stop the lipitor and we can change to prava if the aches are much improve with the change.  She agrees.  We can recheck lipids before a CPE later in 2014.  She agrees.

## 2012-07-09 NOTE — Progress Notes (Signed)
She had been having some atypical aches that may have been coming from the lipitor. We discussed.  This is longstanding per patient.    2 nights ago.  Was at grandson's basketball game, jumped off the end of the bleachers.  Landed on feet, didn't fall.  Immediate pain in L knee. The L knee may have given some, but was still with proper alignment. Pain since then.  Some better in the meantime. She was able to bear weight and drive home.  Yesterday she had pain with any weight bearing and wasn't able to bear weight due to pain.    She's walking with a walker now, some weight bearing.  No pain when sitting.  Lateral knee pain when walking now.  No clicking.  The L knee is puffy but not red or bruised.    Meds, vitals, and allergies reviewed.   ROS: See HPI.  Otherwise, noncontributory.  nad No pain when not weight bearing The L knee is puffy but not red or bruised.   ACL, MCL LCL feel solid on testing.  Normal ROM Patella not ttp Medial and lateral joint line not ttp Is ttp at the proximal and lateral shin but not at the joint line No meniscal click  xrays reviewed.

## 2012-07-28 ENCOUNTER — Telehealth: Payer: Self-pay

## 2012-07-28 MED ORDER — PRAVASTATIN SODIUM 40 MG PO TABS: 40.0000 mg | ORAL_TABLET | Freq: Every day | ORAL | Status: DC

## 2012-07-28 NOTE — Telephone Encounter (Signed)
Would change to pravastatin 40mg . If aches return, cut back to 1/2 (20mg ) a day.  Thanks.

## 2012-07-28 NOTE — Telephone Encounter (Signed)
Pt left v/m to update Dr Para March since pt has stopped Lipitor the muscular and joint pain is much better.Please advise.

## 2012-07-29 NOTE — Telephone Encounter (Signed)
Patient advised.  Patient will call for a 90 day Rx for mail order once she finds out if she can take this medication without side effects.

## 2012-09-11 ENCOUNTER — Encounter: Payer: Self-pay | Admitting: Family Medicine

## 2012-09-13 ENCOUNTER — Other Ambulatory Visit: Payer: Self-pay | Admitting: Family Medicine

## 2012-09-15 ENCOUNTER — Other Ambulatory Visit: Payer: Self-pay | Admitting: Family Medicine

## 2012-09-15 DIAGNOSIS — E7800 Pure hypercholesterolemia, unspecified: Secondary | ICD-10-CM

## 2012-09-24 ENCOUNTER — Other Ambulatory Visit (INDEPENDENT_AMBULATORY_CARE_PROVIDER_SITE_OTHER): Payer: BC Managed Care – PPO

## 2012-09-24 ENCOUNTER — Other Ambulatory Visit: Payer: Self-pay | Admitting: Family Medicine

## 2012-09-24 DIAGNOSIS — M109 Gout, unspecified: Secondary | ICD-10-CM

## 2012-09-24 DIAGNOSIS — E039 Hypothyroidism, unspecified: Secondary | ICD-10-CM

## 2012-09-24 DIAGNOSIS — E7800 Pure hypercholesterolemia, unspecified: Secondary | ICD-10-CM

## 2012-09-24 DIAGNOSIS — R739 Hyperglycemia, unspecified: Secondary | ICD-10-CM

## 2012-09-24 LAB — COMPREHENSIVE METABOLIC PANEL
ALT: 67 U/L — ABNORMAL HIGH (ref 0–35)
Alkaline Phosphatase: 74 U/L (ref 39–117)
CO2: 29 mEq/L (ref 19–32)
Creatinine, Ser: 0.9 mg/dL (ref 0.4–1.2)
GFR: 64.33 mL/min (ref >60.00)
Sodium: 142 mEq/L (ref 135–145)
Total Bilirubin: 1.3 mg/dL — ABNORMAL HIGH (ref 0.3–1.2)

## 2012-09-24 LAB — LDL CHOLESTEROL, DIRECT: Direct LDL: 136.2 mg/dL

## 2012-09-24 LAB — LIPID PANEL
Cholesterol: 202 mg/dL — ABNORMAL HIGH (ref 0–200)
HDL: 36.9 mg/dL — ABNORMAL LOW (ref >39.00)
Total CHOL/HDL Ratio: 5
Triglycerides: 157 mg/dL — ABNORMAL HIGH (ref 0.0–149.0)
VLDL: 31.4 mg/dL (ref 0.0–40.0)

## 2012-09-26 ENCOUNTER — Other Ambulatory Visit: Payer: Self-pay | Admitting: Family Medicine

## 2012-09-26 NOTE — Telephone Encounter (Signed)
Electronic refill request.  Please advise. 

## 2012-09-28 NOTE — Telephone Encounter (Signed)
Sent!

## 2012-09-29 ENCOUNTER — Other Ambulatory Visit (INDEPENDENT_AMBULATORY_CARE_PROVIDER_SITE_OTHER): Payer: BC Managed Care – PPO

## 2012-09-29 DIAGNOSIS — R739 Hyperglycemia, unspecified: Secondary | ICD-10-CM

## 2012-09-29 DIAGNOSIS — R7309 Other abnormal glucose: Secondary | ICD-10-CM

## 2012-09-29 LAB — GLUCOSE, RANDOM: Glucose, Bld: 131 mg/dL — ABNORMAL HIGH (ref 70–99)

## 2012-09-29 LAB — HEMOGLOBIN A1C: Hgb A1c MFr Bld: 5.7 % (ref 4.6–6.5)

## 2012-10-02 ENCOUNTER — Encounter: Payer: Self-pay | Admitting: Family Medicine

## 2012-10-02 ENCOUNTER — Ambulatory Visit (INDEPENDENT_AMBULATORY_CARE_PROVIDER_SITE_OTHER): Payer: BC Managed Care – PPO | Admitting: Family Medicine

## 2012-10-02 VITALS — BP 132/74 | HR 72 | Temp 98.2°F | Ht 64.0 in | Wt 209.8 lb

## 2012-10-02 DIAGNOSIS — M79605 Pain in left leg: Secondary | ICD-10-CM

## 2012-10-02 DIAGNOSIS — E785 Hyperlipidemia, unspecified: Secondary | ICD-10-CM

## 2012-10-02 DIAGNOSIS — I10 Essential (primary) hypertension: Secondary | ICD-10-CM

## 2012-10-02 DIAGNOSIS — E039 Hypothyroidism, unspecified: Secondary | ICD-10-CM

## 2012-10-02 DIAGNOSIS — R739 Hyperglycemia, unspecified: Secondary | ICD-10-CM | POA: Insufficient documentation

## 2012-10-02 DIAGNOSIS — Z0000 Encounter for general adult medical examination without abnormal findings: Secondary | ICD-10-CM | POA: Insufficient documentation

## 2012-10-02 DIAGNOSIS — E119 Type 2 diabetes mellitus without complications: Secondary | ICD-10-CM

## 2012-10-02 DIAGNOSIS — Z1211 Encounter for screening for malignant neoplasm of colon: Secondary | ICD-10-CM

## 2012-10-02 DIAGNOSIS — M79609 Pain in unspecified limb: Secondary | ICD-10-CM

## 2012-10-02 MED ORDER — METOPROLOL SUCCINATE ER 200 MG PO TB24: 200.0000 mg | ORAL_TABLET | Freq: Every day | ORAL | Status: DC

## 2012-10-02 MED ORDER — LEVOTHYROXINE SODIUM 200 MCG PO TABS: ORAL_TABLET | ORAL | Status: DC

## 2012-10-02 MED ORDER — PRAVASTATIN SODIUM 40 MG PO TABS: 40.0000 mg | ORAL_TABLET | Freq: Every day | ORAL | Status: DC

## 2012-10-02 MED ORDER — COLCHICINE 0.6 MG PO TABS: 0.6000 mg | ORAL_TABLET | Freq: Two times a day (BID) | ORAL | Status: DC | PRN

## 2012-10-02 NOTE — Assessment & Plan Note (Signed)
D/w pt about diet and weight, continue prava; was intol of lipitor.

## 2012-10-02 NOTE — Assessment & Plan Note (Signed)
Routine anticipatory guidance given to patient.  See health maintenance. Tetanus 2010.  Shingles shot 2012 Flu shot done yearly PNA shot 2012 D/w patient [id number] for colon cancer screening, including IFOB vs. colonoscopy.  Risks and benefits of both were discussed and patient voiced understanding.  Pt elects for: IFOB.  Mammogram done recently, has f/u for 6 months.  Discussed.   DXA 2013.  Pap smear not indicated.  Living will d/w pt. She has one.  If incapacitated, would have her husband designated.

## 2012-10-02 NOTE — Patient Instructions (Addendum)
Go to the lab on the way out.  We'll contact you with your lab report.  Recheck labs in 3 months with a visit a few days later.  Use the back exercises. Call back if not better and we'll set up PT.  Look up [email or web address]- type 2 diabetes.  Low carb diet.  Increase your thyroid medicine.  1.5 tabs on Fridays, 1 tab on other days.  Take aleve, 1-2 tabs up to twice a day with food for pain.

## 2012-10-02 NOTE — Progress Notes (Signed)
CPE- See plan.  Routine anticipatory guidance given to patient.  See health maintenance. Tetanus 2010.  Shingles shot 2012 Flu shot done yearly PNA shot 2012 D/w patient [id number] for colon cancer screening, including IFOB vs. colonoscopy.  Risks and benefits of both were discussed and patient voiced understanding.  Pt elects for: IFOB.  Mammogram done recently, has f/u for 6 months.  Discussed.   DXA 2013.  Pap smear not indicated.  Living will d/w pt. She has one.  If incapacitated, would have her husband designated.   Diabetes: no meds.  New dx. Discussed.  She has a high carb diet recently due to gout.  D/w pt about labs and DM2 path/phys.   Hypothyroid.  No dysphagia.  Noted hair, skin changes. Fatigued.  TSH up.   Hypertension:    Using medication without problems or lightheadedness: yes Chest pain with exertion:no Edema:no Short of breath: minimal, likely from deconditioning  Elevated Cholesterol: Using medications without problems:yes Muscle aches: no Diet compliance: see above Exercise: limited by gout  Gout.  Worse recently.  Was on high carb diet to help with gout.  Still with R 1st MTP erythema but some better recently.    L sided sciatica sx going on intermittently.  Worse if standing still, better walking or sitting.  No weakness.  Radiates down the leg in L4L5 dermatome pattern.  No R sided sx.  Occ lower back pain.  No saddle sx or urinary changes.   PMH and SH reviewed  Meds, vitals, and allergies reviewed.   ROS: See HPI.  Otherwise negative.    GEN: nad, alert and oriented HEENT: mucous membranes moist,tm wnl, nasal and op exam wnl NECK: supple w/o LA, no TMG CV: rrr. PULM: ctab, no inc wob ABD: soft, +bs EXT: no edema SKIN: no acute rash but R 1st MPT with erythema and tenderness.  Sensation intact in the BLE and no weakness in BLE.  SLR neg.

## 2012-10-02 NOTE — Assessment & Plan Note (Signed)
Gout diet given, continue colchicine and use 1-2 aleve BID prn with GI caution.  Would avoid prednisone.  D/w pt.  Wouldn't start allopurinol now with the ongoing sx.  She may need this eventually.  Would likely improve with weight loss.

## 2012-10-02 NOTE — Assessment & Plan Note (Signed)
Sciatica.  D/w pt about anatomy and home exercises, handout given.  If not improved then refer to PT. She agrees.  No need to image today.

## 2012-10-02 NOTE — Assessment & Plan Note (Signed)
D/w pt about diet and weight, continue current meds.

## 2012-10-02 NOTE — Assessment & Plan Note (Signed)
New dx. Discussed. She has a high carb diet recently due to gout. D/w pt about labs and DM2 path/phys.  She'll read up on [email or web address] and recheck A1c in 3 months.

## 2012-10-02 NOTE — Assessment & Plan Note (Signed)
Inc replacement and recheck in 3 months with other labs.  No TMG on exam.

## 2012-10-20 ENCOUNTER — Telehealth: Payer: Self-pay | Admitting: Family Medicine

## 2012-10-20 DIAGNOSIS — M5432 Sciatica, left side: Secondary | ICD-10-CM

## 2012-10-20 NOTE — Telephone Encounter (Signed)
Patient wants a Referral to Physical Therapy in [place] , wants an early moring or late afternoon. Call patient back when referral is in.

## 2012-10-20 NOTE — Telephone Encounter (Signed)
Referral is in. Thanks.

## 2012-10-21 ENCOUNTER — Telehealth: Payer: Self-pay | Admitting: *Deleted

## 2012-10-21 DIAGNOSIS — M109 Gout, unspecified: Secondary | ICD-10-CM

## 2012-10-21 NOTE — Telephone Encounter (Signed)
Patient says you asked her to call in when her episode of gout had subsided and that you would prescribe some maintenance medication, she assumes maybe Allopurinol.  That episode has subsided now and she would like that Rx sent to [facility], 90 day supply with RF's.

## 2012-10-22 MED ORDER — ALLOPURINOL 100 MG PO TABS: 200.0000 mg | ORAL_TABLET | Freq: Every day | ORAL | Status: DC

## 2012-10-22 NOTE — Telephone Encounter (Signed)
Sent.  Would start with 1 a day for 1 week, then inc to 2 a day from that point on.  Rx was sent as 2 a day, but start as directed here.  I would recheck uric acid in about 1 month.  Thanks.  Orders are in.

## 2012-10-22 NOTE — Telephone Encounter (Signed)
Spoke with patient and advised results She will call for lab appt in 1 week

## 2012-10-30 ENCOUNTER — Other Ambulatory Visit (INDEPENDENT_AMBULATORY_CARE_PROVIDER_SITE_OTHER): Payer: BC Managed Care – PPO

## 2012-10-30 DIAGNOSIS — Z1211 Encounter for screening for malignant neoplasm of colon: Secondary | ICD-10-CM

## 2012-10-30 LAB — FECAL OCCULT BLOOD, IMMUNOCHEMICAL: Fecal Occult Bld: NEGATIVE

## 2013-01-01 ENCOUNTER — Other Ambulatory Visit (INDEPENDENT_AMBULATORY_CARE_PROVIDER_SITE_OTHER): Payer: BC Managed Care – PPO

## 2013-01-01 DIAGNOSIS — E119 Type 2 diabetes mellitus without complications: Secondary | ICD-10-CM

## 2013-01-01 DIAGNOSIS — E039 Hypothyroidism, unspecified: Secondary | ICD-10-CM

## 2013-01-01 DIAGNOSIS — M109 Gout, unspecified: Secondary | ICD-10-CM

## 2013-01-01 DIAGNOSIS — I10 Essential (primary) hypertension: Secondary | ICD-10-CM

## 2013-01-01 LAB — TSH: TSH: 0.25 u[IU]/mL — ABNORMAL LOW (ref 0.35–5.50)

## 2013-01-01 LAB — URIC ACID: Uric Acid, Serum: 8.3 mg/dL — ABNORMAL HIGH (ref 2.4–7.0)

## 2013-01-05 ENCOUNTER — Other Ambulatory Visit: Payer: BC Managed Care – PPO

## 2013-01-08 ENCOUNTER — Ambulatory Visit (INDEPENDENT_AMBULATORY_CARE_PROVIDER_SITE_OTHER): Payer: BC Managed Care – PPO | Admitting: Family Medicine

## 2013-01-08 ENCOUNTER — Encounter: Payer: Self-pay | Admitting: Family Medicine

## 2013-01-08 VITALS — BP 142/84 | HR 83 | Temp 98.2°F | Wt 210.2 lb

## 2013-01-08 DIAGNOSIS — R7309 Other abnormal glucose: Secondary | ICD-10-CM

## 2013-01-08 DIAGNOSIS — E669 Obesity, unspecified: Secondary | ICD-10-CM

## 2013-01-08 DIAGNOSIS — M109 Gout, unspecified: Secondary | ICD-10-CM

## 2013-01-08 DIAGNOSIS — R739 Hyperglycemia, unspecified: Secondary | ICD-10-CM

## 2013-01-08 DIAGNOSIS — E119 Type 2 diabetes mellitus without complications: Secondary | ICD-10-CM

## 2013-01-08 DIAGNOSIS — E039 Hypothyroidism, unspecified: Secondary | ICD-10-CM

## 2013-01-08 MED ORDER — LEVOTHYROXINE SODIUM 88 MCG PO TABS: 88.0000 ug | ORAL_TABLET | Freq: Every day | ORAL | Status: DC

## 2013-01-08 MED ORDER — LEVOTHYROXINE SODIUM 125 MCG PO TABS: 125.0000 ug | ORAL_TABLET | Freq: Every day | ORAL | Status: DC

## 2013-01-08 NOTE — Progress Notes (Signed)
DM2-  A1c 5.8.  She has been cutting out carbs.  She feels better on low carb diet.  Hasn't been needing to check sugar at home.  Weight discussed.  Exercise- limited- working long hours.  Discussed her schedule and trying to get some at least on the weekend.    Gout.  Off allopurinol now.  Discussed.  Rare colchicine use.  Labs d/w pt.  See plan.    Hypothyroid.  Taking per 7 days.  ~281mcg per day.  No neck mass, no dysphagia.  TSH low.  Labs discussed.   Meds, vitals, and allergies reviewed.   ROS: See HPI.  Otherwise, noncontributory.  nad ncat Neck supple, no LA, no TMG rrr ctab Ext w/o edema

## 2013-01-08 NOTE — Patient Instructions (Signed)
Take 125+54mcg a day and recheck TSH in 2 months.  Gradually add back the allopurinol- 1 a day for about 2 weeks then 2 a day.  Recheck uric acid in about 2 months.  Recheck A1c in about 4 months.  Physical and labs ahead in ~3/15.  Glad to see you.

## 2013-01-09 ENCOUNTER — Encounter: Payer: Self-pay | Admitting: Family Medicine

## 2013-01-09 ENCOUNTER — Telehealth: Payer: Self-pay

## 2013-01-09 ENCOUNTER — Ambulatory Visit (INDEPENDENT_AMBULATORY_CARE_PROVIDER_SITE_OTHER): Payer: BC Managed Care – PPO | Admitting: Family Medicine

## 2013-01-09 VITALS — BP 130/80 | HR 81 | Temp 98.3°F | Ht 64.0 in | Wt 210.5 lb

## 2013-01-09 DIAGNOSIS — R300 Dysuria: Secondary | ICD-10-CM

## 2013-01-09 LAB — POCT URINALYSIS DIPSTICK
Bilirubin, UA: NEGATIVE
Glucose, UA: NEGATIVE
Nitrite, UA: NEGATIVE
Urobilinogen, UA: NEGATIVE
pH, UA: 6.5

## 2013-01-09 MED ORDER — CIPROFLOXACIN HCL 250 MG PO TABS: 250.0000 mg | ORAL_TABLET | Freq: Two times a day (BID) | ORAL | Status: DC

## 2013-01-09 NOTE — Telephone Encounter (Signed)
Pt has feeling of frequency every 15-20 mins;urine is cloudy, but no pain when urinates, no abd or back pain and no odor. Pt request medication for UTI. Pt scheduled appt with Dr Patsy Lager today at (;45 am.(Dr Para March not available).

## 2013-01-09 NOTE — Assessment & Plan Note (Signed)
Change to 125+46mcg to get the likely appropriate dose and recheck tsh in about 8 weeks. She agrees.

## 2013-01-09 NOTE — Progress Notes (Signed)
Nature conservation officer at [facility] [address] [place] [address] Phone: [phone] Fax: [phone]  Date:  01/09/2013   Name:  Mercedes [name]   DOB:  1946/05/15   MRN:  [id number] Gender: female Age: 66 y.o.  Primary Physician:  Crawford Givens, MD  Evaluating MD: Hannah Beat, MD   Chief Complaint: Urinary Tract Infection   History of Present Illness:  Mercedes [name] is a 67 y.o. pleasant patient who presents with the following:  Now feeling heavy pressure, and urgency. Has only had  - Cloudy AM specimen that was cloudy.   Results for orders placed in visit on 01/09/13  POCT URINALYSIS DIPSTICK      Result Value Range   Color, UA yellow     Clarity, UA cloudy     Glucose, UA neg     Bilirubin, UA neg     Ketones, UA neg     Spec Grav, UA 1.010     Blood, UA large     pH, UA 6.5     Protein, UA neg     Urobilinogen, UA negative     Nitrite, UA neg     Leukocytes, UA large (3+)       Patient Active Problem List   Diagnosis Date Noted  . Diabetes mellitus, type 2 10/02/2012  . Routine general medical examination at a health care facility 10/02/2012  . Leg pain 07/09/2012  . Colon cancer screening 09/02/2011  . Osteoporosis screening 09/02/2011  . Obesity 11/08/2010  . Gout   . HLD (hyperlipidemia)   . HYPOTHYROIDISM NOS 01/10/2007  . HYPERTENSION, BENIGN ESSENTIAL 01/10/2007  . DIVERTICULOSIS, COLON 01/10/2007  . CHOLELITHIASIS 01/10/2007  . MENOPAUSAL SYNDROME 01/10/2007  . OSTEOARTHRITIS 01/10/2007    Past Medical History  Diagnosis Date  . Diverticulosis of colon   . Gout   . HLD (hyperlipidemia)   . HTN (hypertension)   . Hypothyroidism   . Osteoarthritis   . Tobacco abuse   . Fatty liver     No past surgical history on file.  History   Social History  . Marital Status: Married    Spouse Name: N/A    Number of Children: 2  . Years of Education: N/A   Occupational History  . Teacher-retired    Social History Main  Topics  . Smoking status: Former Smoker    Quit date: 07/04/2010  . Smokeless tobacco: Never Used     Comment: 5 pyh current smoker, 1/2 PPD, started ~18 YOA, quit 10 years, restarted ~1 year ago  . Alcohol Use: Yes     Comment: Wine  . Drug Use: No  . Sexually Active: Not on file   Other Topics Concern  . Not on file   Social History Narrative   Married 1964   2 kids, in [place]   Working with [employer]   Enjoys golf    Family History  Problem Relation Age of Onset  . Ulcers Father   . Angina Mother   . Cancer Brother   . Hypertension Brother   . Prostate cancer Brother   . Colon cancer Neg Hx   . Breast cancer Neg Hx     Allergies  Allergen Reactions  . Ace Inhibitors     REACTION: cough  . Clarithromycin     REACTION: GI upset  . Fenofibrate     REACTION: itching  . Lipitor (Atorvastatin)     myalgias  . Niacin   .  Penicillins     REACTION: Purpura  . Sulfasalazine     REACTION: Purpura  . Tetracycline Hcl     REACTION: GI upset    Medication list has been reviewed and updated.  Outpatient Prescriptions Prior to Visit  Medication Sig Dispense Refill  . allopurinol (ZYLOPRIM) 100 MG tablet Take 2 tablets (200 mg total) by mouth daily.  180 tablet  3  . Calcium Carbonate 1500 MG TABS Take 1 tablet by mouth daily.        . colchicine (COLCRYS) 0.6 MG tablet Take 1 tablet (0.6 mg total) by mouth 2 (two) times daily as needed.  30 tablet  12  . levothyroxine (SYNTHROID, LEVOTHROID) 125 MCG tablet Take 1 tablet (125 mcg total) by mouth daily before breakfast. Take with daily.  90 tablet  3  . levothyroxine (SYNTHROID, LEVOTHROID) 88 MCG tablet Take 1 tablet (88 mcg total) by mouth daily before breakfast. Take with 125 mcg daily.  90 tablet  3  . metoprolol (TOPROL-XL) 200 MG 24 hr tablet Take 1 tablet (200 mg total) by mouth daily.  90 tablet  3  . Multiple Vitamin (MULTIVITAMIN) tablet Take 1 tablet by mouth daily.        . Omega-3 Fatty Acids  (FISH OIL) 1200 MG CAPS Take by mouth daily.        . pravastatin (PRAVACHOL) 40 MG tablet Take 1 tablet (40 mg total) by mouth daily.  90 tablet  3  . vitamin D, CHOLECALCIFEROL, 400 UNITS tablet Take 400 Units by mouth 2 (two) times daily.       No facility-administered medications prior to visit.    Review of Systems:  ROS: GEN: Acute illness details above GI: Tolerating PO intake GU: maintaining adequate hydration and urination Pulm: No SOB Interactive and getting along well at home.  Otherwise, ROS is as per the HPI.   Physical Examination: BP 130/80  Pulse 81  Temp(Src) 98.3 F (36.8 C) (Oral)  Ht 5\' 4"  (1.626 m)  Wt 210 lb 8 oz (95.482 kg)  BMI 36.11 kg/m2  SpO2 96%  Ideal Body Weight: Weight in (lb) to have BMI = 25: 145.3   GEN: WDWN, A&Ox4,NAD. Non-toxic HEENT: Atraumatc, normocephalic. CV: RRR, No M/G/R PULM: CTA B, No wheezes, crackles, or rhonchi ABD: S, NT, ND, +BS, no rebound. No CVAT. No suprapubic tenderness. EXT: No c/c/e   Assessment and Plan:  Dysuria - Plan: POCT Urinalysis Dipstick, ciprofloxacin (CIPRO) 250 MG tablet, Urine culture  prob uti  Orders Today:  Orders Placed This Encounter  Procedures  . Urine culture  . POCT Urinalysis Dipstick    Updated Medication List: (Includes new medications, updates to list, dose adjustments) Meds ordered this encounter  Medications  . ciprofloxacin (CIPRO) 250 MG tablet    Sig: Take 1 tablet (250 mg total) by mouth 2 (two) times daily.    Dispense:  14 tablet    Refill:  0    Medications Discontinued: There are no discontinued medications.    Signed, Elpidio Galea. Lis Savitt, MD 01/09/2013 10:05 AM

## 2013-01-09 NOTE — Assessment & Plan Note (Signed)
D/w pt about weight loss.  

## 2013-01-09 NOTE — Assessment & Plan Note (Signed)
Reasonable A1c, no new meds, continue to work on weight and recheck in about 4-5 months.

## 2013-01-09 NOTE — Assessment & Plan Note (Signed)
Urate elevated, gradually add back the allopurinol and then recheck lab in about 8 weeks. She agrees.

## 2013-01-12 ENCOUNTER — Ambulatory Visit: Payer: BC Managed Care – PPO | Admitting: Family Medicine

## 2013-01-12 LAB — URINE CULTURE: Colony Count: 30000

## 2013-01-20 ENCOUNTER — Other Ambulatory Visit: Payer: Self-pay | Admitting: Family Medicine

## 2013-01-25 ENCOUNTER — Other Ambulatory Visit: Payer: Self-pay | Admitting: Family Medicine

## 2013-02-04 ENCOUNTER — Other Ambulatory Visit: Payer: Self-pay

## 2013-03-11 ENCOUNTER — Other Ambulatory Visit: Payer: BC Managed Care – PPO

## 2013-03-19 ENCOUNTER — Encounter: Payer: Self-pay | Admitting: Family Medicine

## 2013-03-20 ENCOUNTER — Encounter: Payer: Self-pay | Admitting: *Deleted

## 2013-03-26 ENCOUNTER — Other Ambulatory Visit: Payer: Self-pay | Admitting: Family Medicine

## 2013-03-26 ENCOUNTER — Other Ambulatory Visit (INDEPENDENT_AMBULATORY_CARE_PROVIDER_SITE_OTHER): Payer: BC Managed Care – PPO

## 2013-03-26 DIAGNOSIS — E039 Hypothyroidism, unspecified: Secondary | ICD-10-CM

## 2013-03-26 DIAGNOSIS — M109 Gout, unspecified: Secondary | ICD-10-CM

## 2013-03-26 MED ORDER — ALLOPURINOL 100 MG PO TABS: 300.0000 mg | ORAL_TABLET | Freq: Every day | ORAL | Status: DC

## 2013-04-16 ENCOUNTER — Ambulatory Visit: Payer: BC Managed Care – PPO

## 2013-05-07 ENCOUNTER — Other Ambulatory Visit: Payer: Self-pay

## 2013-05-13 ENCOUNTER — Other Ambulatory Visit (INDEPENDENT_AMBULATORY_CARE_PROVIDER_SITE_OTHER): Payer: BC Managed Care – PPO

## 2013-05-13 DIAGNOSIS — R7309 Other abnormal glucose: Secondary | ICD-10-CM

## 2013-05-13 DIAGNOSIS — R739 Hyperglycemia, unspecified: Secondary | ICD-10-CM

## 2013-05-13 LAB — HEMOGLOBIN A1C: Hgb A1c MFr Bld: 5.9 % (ref 4.6–6.5)

## 2013-07-28 ENCOUNTER — Other Ambulatory Visit: Payer: Self-pay | Admitting: *Deleted

## 2013-07-28 MED ORDER — METOPROLOL SUCCINATE ER 200 MG PO TB24: 200.0000 mg | ORAL_TABLET | Freq: Every day | ORAL | Status: DC

## 2013-07-28 MED ORDER — PRAVASTATIN SODIUM 40 MG PO TABS: 40.0000 mg | ORAL_TABLET | Freq: Every day | ORAL | Status: DC

## 2013-07-28 MED ORDER — COLCHICINE 0.6 MG PO TABS: 0.6000 mg | ORAL_TABLET | Freq: Two times a day (BID) | ORAL | Status: DC | PRN

## 2013-07-28 MED ORDER — LEVOTHYROXINE SODIUM 125 MCG PO TABS: 125.0000 ug | ORAL_TABLET | Freq: Every day | ORAL | Status: DC

## 2013-07-28 MED ORDER — LEVOTHYROXINE SODIUM 88 MCG PO TABS: 88.0000 ug | ORAL_TABLET | Freq: Every day | ORAL | Status: DC

## 2013-11-17 ENCOUNTER — Other Ambulatory Visit: Payer: Self-pay | Admitting: Family Medicine

## 2013-11-17 DIAGNOSIS — M109 Gout, unspecified: Secondary | ICD-10-CM

## 2013-11-17 DIAGNOSIS — E119 Type 2 diabetes mellitus without complications: Secondary | ICD-10-CM

## 2013-11-24 ENCOUNTER — Other Ambulatory Visit (INDEPENDENT_AMBULATORY_CARE_PROVIDER_SITE_OTHER): Payer: 59

## 2013-11-24 DIAGNOSIS — I10 Essential (primary) hypertension: Secondary | ICD-10-CM

## 2013-11-24 DIAGNOSIS — Z0000 Encounter for general adult medical examination without abnormal findings: Secondary | ICD-10-CM

## 2013-11-24 DIAGNOSIS — M109 Gout, unspecified: Secondary | ICD-10-CM

## 2013-11-24 DIAGNOSIS — E785 Hyperlipidemia, unspecified: Secondary | ICD-10-CM

## 2013-11-24 DIAGNOSIS — E119 Type 2 diabetes mellitus without complications: Secondary | ICD-10-CM

## 2013-11-24 LAB — MICROALBUMIN / CREATININE URINE RATIO
Creatinine,U: 123.7 mg/dL
Microalb Creat Ratio: 0.6 mg/g (ref 0.0–30.0)
Microalb, Ur: 0.8 mg/dL (ref 0.0–1.9)

## 2013-11-24 LAB — COMPREHENSIVE METABOLIC PANEL
ALK PHOS: 56 U/L (ref 39–117)
ALT: 31 U/L (ref 0–35)
AST: 30 U/L (ref 0–37)
Albumin: 3.9 g/dL (ref 3.5–5.2)
BUN: 17 mg/dL (ref 6–23)
CO2: 25 meq/L (ref 19–32)
Calcium: 10.7 mg/dL — ABNORMAL HIGH (ref 8.4–10.5)
Chloride: 105 mEq/L (ref 96–112)
Creatinine, Ser: 0.8 mg/dL (ref 0.4–1.2)
GFR: 74.26 mL/min (ref >60.00)
Glucose, Bld: 121 mg/dL — ABNORMAL HIGH (ref 70–99)
POTASSIUM: 4.2 meq/L (ref 3.5–5.1)
SODIUM: 141 meq/L (ref 135–145)
TOTAL PROTEIN: 7.6 g/dL (ref 6.0–8.3)
Total Bilirubin: 1.2 mg/dL (ref 0.2–1.2)

## 2013-11-24 LAB — LIPID PANEL
Cholesterol: 185 mg/dL (ref 0–200)
HDL: 41.9 mg/dL (ref >39.00)
LDL CALC: 104 mg/dL — AB (ref 0–99)
Total CHOL/HDL Ratio: 4
Triglycerides: 196 mg/dL — ABNORMAL HIGH (ref 0.0–149.0)
VLDL: 39.2 mg/dL (ref 0.0–40.0)

## 2013-11-24 LAB — TSH: TSH: 0.39 u[IU]/mL (ref 0.35–4.50)

## 2013-11-24 LAB — URIC ACID: URIC ACID, SERUM: 7.3 mg/dL — AB (ref 2.4–7.0)

## 2013-11-24 LAB — HEMOGLOBIN A1C: HEMOGLOBIN A1C: 5.5 % (ref 4.6–6.5)

## 2013-12-01 ENCOUNTER — Encounter: Payer: Self-pay | Admitting: Family Medicine

## 2013-12-01 ENCOUNTER — Ambulatory Visit (INDEPENDENT_AMBULATORY_CARE_PROVIDER_SITE_OTHER): Payer: Medicare Other | Admitting: Family Medicine

## 2013-12-01 VITALS — BP 128/78 | HR 84 | Temp 98.4°F | Ht 64.0 in | Wt 213.8 lb

## 2013-12-01 DIAGNOSIS — Z1211 Encounter for screening for malignant neoplasm of colon: Secondary | ICD-10-CM

## 2013-12-01 DIAGNOSIS — E785 Hyperlipidemia, unspecified: Secondary | ICD-10-CM

## 2013-12-01 DIAGNOSIS — M109 Gout, unspecified: Secondary | ICD-10-CM

## 2013-12-01 DIAGNOSIS — E039 Hypothyroidism, unspecified: Secondary | ICD-10-CM

## 2013-12-01 DIAGNOSIS — Z0000 Encounter for general adult medical examination without abnormal findings: Secondary | ICD-10-CM

## 2013-12-01 DIAGNOSIS — R739 Hyperglycemia, unspecified: Secondary | ICD-10-CM

## 2013-12-01 DIAGNOSIS — I10 Essential (primary) hypertension: Secondary | ICD-10-CM

## 2013-12-01 DIAGNOSIS — R7309 Other abnormal glucose: Secondary | ICD-10-CM

## 2013-12-01 MED ORDER — LEVOTHYROXINE SODIUM 125 MCG PO TABS: 125.0000 ug | ORAL_TABLET | Freq: Every day | ORAL | Status: DC

## 2013-12-01 MED ORDER — METOPROLOL SUCCINATE ER 200 MG PO TB24: 200.0000 mg | ORAL_TABLET | Freq: Every day | ORAL | Status: DC

## 2013-12-01 MED ORDER — LEVOTHYROXINE SODIUM 88 MCG PO TABS: 88.0000 ug | ORAL_TABLET | Freq: Every day | ORAL | Status: DC

## 2013-12-01 MED ORDER — PRAVASTATIN SODIUM 40 MG PO TABS: 40.0000 mg | ORAL_TABLET | Freq: Every day | ORAL | Status: DC

## 2013-12-01 MED ORDER — COLCHICINE 0.6 MG PO TABS: 0.6000 mg | ORAL_TABLET | Freq: Two times a day (BID) | ORAL | Status: DC | PRN

## 2013-12-01 NOTE — Assessment & Plan Note (Signed)
See scanned forms.  Routine anticipatory guidance given to patient.  See health maintenance. Flu 2014 Shingles 2012 PNA 2012 Tetanus 2010 D/w patient [id number] for colon cancer screening, including IFOB vs. colonoscopy.  Risks and benefits of both were discussed and patient voiced understanding.  Pt elects for: IFOB.  Breast cancer screening- up to date.  Advance directive- would have her husband and sons equally designated.   Cognitive function addressed- see scanned forms- and if abnormal then additional documentation follows.  DXA can be deferred until 2016, d/w pt.

## 2013-12-01 NOTE — Assessment & Plan Note (Signed)
reasonably well controlled, continue as is.  Labs d/w pt.

## 2013-12-01 NOTE — Assessment & Plan Note (Signed)
Controlled by A1c, d/w pt about diet and exercise.

## 2013-12-01 NOTE — Patient Instructions (Addendum)
Go to the lab on the way out.  We'll contact you with your lab report. Skip the pravastatin when taking colchicine.  Don't change your meds for now.   Take care.  Glad to see you.  Try walking for more exercise.

## 2013-12-01 NOTE — Progress Notes (Signed)
Pre visit review using our clinic review tool, if applicable. No additional management support is needed unless otherwise documented below in the visit note.  I have personally reviewed the Medicare Annual Wellness questionnaire and have noted 1. The patient's medical and social history 2. Their use of alcohol, tobacco or illicit drugs 3. Their current medications and supplements 4. The patient's functional ability including ADL's, fall risks, home safety risks and hearing or visual             impairment. 5. Diet and physical activities 6. Evidence for depression or mood disorders  The patients weight, height, BMI have been recorded in the chart and visual acuity is per eye clinic.  I have made referrals, counseling and provided education to the patient based review of the above and I have provided the pt with a written personalized care plan for preventive services.  See scanned forms.  Routine anticipatory guidance given to patient.  See health maintenance. Flu 2014 Shingles 2012 PNA 2012 Tetanus 2010 D/w patient SW:FUXNATF for colon cancer screening, including IFOB vs. colonoscopy.  Risks and benefits of both were discussed and patient voiced understanding.  Pt elects for: IFOB.  Breast cancer screening- up to date.  Advance directive- would have her husband and sons equally designated.   Cognitive function addressed- see scanned forms- and if abnormal then additional documentation follows.  DXA can be deferred until 2016, d/w pt.   Occ gout flare.  Uses colchicine and improves.  Not needing proph meds.  Doing well overall.   Elevated Cholesterol: Using medications without problems:yes Muscle aches: occ Diet compliance: d/w pt.  Exercise:encouraged more  Hypertension:    Using medication without problems: yes Chest pain with exertion:no Edema:occ, better with elevation. Short of breath:no  Hypothyroidism.  No neck mass, no dysphagia.  Complaint.  TSH wnl.   Hyperglycemia.  A1c at goal.  D/w pt re: labs diet and exercise.   Hypercalcemia.  D/w pt.  No clear certain cause but noted Ca++/vit supplement noted.   PMH and SH reviewed  Meds, vitals, and allergies reviewed.   ROS: See HPI.  Otherwise negative.    GEN: nad, alert and oriented HEENT: mucous membranes moist NECK: supple w/o LA, no tmg.   CV: rrr. PULM: ctab, no inc wob ABD: soft, +bs EXT: no edema SKIN: no acute rash

## 2013-12-01 NOTE — Assessment & Plan Note (Signed)
Controlled with rare flares, continue as is.

## 2013-12-01 NOTE — Assessment & Plan Note (Signed)
D/w pt. No clear certain cause but noted Ca++/vit supplement noted.   See notes on f/u labs.

## 2013-12-01 NOTE — Assessment & Plan Note (Signed)
Controlled, continue as is.  Labs d/w pt.  

## 2013-12-02 ENCOUNTER — Other Ambulatory Visit: Payer: Self-pay | Admitting: Family Medicine

## 2013-12-02 LAB — PTH, INTACT AND CALCIUM
Calcium: 10.6 mg/dL — ABNORMAL HIGH (ref 8.4–10.5)
PTH: 47.1 pg/mL (ref 14.0–72.0)

## 2013-12-02 LAB — VITAMIN D 25 HYDROXY (VIT D DEFICIENCY, FRACTURES): Vit D, 25-Hydroxy: 61 ng/mL (ref 30–89)

## 2013-12-07 ENCOUNTER — Telehealth: Payer: Self-pay | Admitting: Family Medicine

## 2013-12-07 NOTE — Telephone Encounter (Signed)
Pt called you back from 12/04/13 regarding lab results. Callback # [phone]

## 2013-12-07 NOTE — Telephone Encounter (Signed)
Patient advised.

## 2013-12-16 ENCOUNTER — Other Ambulatory Visit (INDEPENDENT_AMBULATORY_CARE_PROVIDER_SITE_OTHER): Payer: Medicare Other

## 2013-12-16 DIAGNOSIS — Z1211 Encounter for screening for malignant neoplasm of colon: Secondary | ICD-10-CM

## 2013-12-16 LAB — FECAL OCCULT BLOOD, IMMUNOCHEMICAL: FECAL OCCULT BLD: POSITIVE — AB

## 2013-12-17 ENCOUNTER — Other Ambulatory Visit: Payer: Self-pay | Admitting: Family Medicine

## 2013-12-17 DIAGNOSIS — R195 Other fecal abnormalities: Secondary | ICD-10-CM

## 2013-12-18 ENCOUNTER — Encounter: Payer: Self-pay | Admitting: Gastroenterology

## 2013-12-24 ENCOUNTER — Ambulatory Visit (AMBULATORY_SURGERY_CENTER): Payer: Medicare Other | Admitting: *Deleted

## 2013-12-24 VITALS — Ht 64.0 in | Wt 212.0 lb

## 2013-12-24 DIAGNOSIS — R195 Other fecal abnormalities: Secondary | ICD-10-CM

## 2013-12-24 MED ORDER — MOVIPREP 100 G PO SOLR: ORAL | Status: DC

## 2013-12-24 NOTE — Progress Notes (Signed)
No egg or soy allergy  No diet medications given  No trouble with anesthesia   Pt has had a heme positive stool.  Note sent to Dr. Ardis Hughs to see if she is ok for a direct colonoscopy or needs an OV first: Milus Banister, MD Laverna Peace, RN            She is OK for direct, Thanks      Previous Messages      ----- Message -----  From: Laverna Peace, RN  Sent: 12/24/2013 11:18 AM  To: Milus Banister, MD   Dr. Ardis Hughs,   This pt had a heme positive stool per her MD First time having a colonoscopy. Is she ok for direct?   Thanks,  [employer]

## 2013-12-30 ENCOUNTER — Ambulatory Visit: Payer: Medicare Other

## 2013-12-30 ENCOUNTER — Encounter: Payer: Self-pay | Admitting: Gastroenterology

## 2013-12-30 ENCOUNTER — Ambulatory Visit (AMBULATORY_SURGERY_CENTER): Payer: Medicare Other | Admitting: Gastroenterology

## 2013-12-30 VITALS — BP 140/63 | HR 92 | Temp 97.8°F | Resp 24 | Ht 64.0 in | Wt 212.0 lb

## 2013-12-30 DIAGNOSIS — R195 Other fecal abnormalities: Secondary | ICD-10-CM

## 2013-12-30 DIAGNOSIS — K5730 Diverticulosis of large intestine without perforation or abscess without bleeding: Secondary | ICD-10-CM

## 2013-12-30 DIAGNOSIS — D126 Benign neoplasm of colon, unspecified: Secondary | ICD-10-CM

## 2013-12-30 DIAGNOSIS — K644 Residual hemorrhoidal skin tags: Secondary | ICD-10-CM

## 2013-12-30 MED ORDER — SODIUM CHLORIDE 0.9 % IV SOLN: 500.0000 mL | INTRAVENOUS | Status: DC

## 2013-12-30 NOTE — Patient Instructions (Signed)
YOU HAD AN ENDOSCOPIC PROCEDURE TODAY AT THE [facility]: Refer to the procedure report that was given to you for any specific questions about what was found during the examination.  If the procedure report does not answer your questions, please call your gastroenterologist to clarify.  If you requested that your care partner not be given the details of your procedure findings, then the procedure report has been included in a sealed envelope for you to review at your convenience later.  YOU SHOULD EXPECT: Some feelings of bloating in the abdomen. Passage of more gas than usual.  Walking can help get rid of the air that was put into your GI tract during the procedure and reduce the bloating. If you had a lower endoscopy (such as a colonoscopy or flexible sigmoidoscopy) you may notice spotting of blood in your stool or on the toilet paper. If you underwent a bowel prep for your procedure, then you may not have a normal bowel movement for a few days.  DIET: Your first meal following the procedure should be a light meal and then it is ok to progress to your normal diet.  A half-sandwich or bowl of soup is an example of a good first meal.  Heavy or fried foods are harder to digest and may make you feel nauseous or bloated.  Likewise meals heavy in dairy and vegetables can cause extra gas to form and this can also increase the bloating.  Drink plenty of fluids but you should avoid alcoholic beverages for 24 hours.  ACTIVITY: Your care partner should take you home directly after the procedure.  You should plan to take it easy, moving slowly for the rest of the day.  You can resume normal activity the day after the procedure however you should NOT DRIVE or use heavy machinery for 24 hours (because of the sedation medicines used during the test).    SYMPTOMS TO REPORT IMMEDIATELY: A gastroenterologist can be reached at any hour.  During normal business hours, 8:30 AM to 5:00 PM Monday through Friday,  call [phone].  After hours and on weekends, please call the GI answering service at [phone] who will take a message and have the physician on call contact you.   Following lower endoscopy (colonoscopy or flexible sigmoidoscopy):  Excessive amounts of blood in the stool  Significant tenderness or worsening of abdominal pains  Swelling of the abdomen that is new, acute  Fever of 100F or higher    FOLLOW UP: If any biopsies were taken you will be contacted by phone or by letter within the next 1-3 weeks.  Call your gastroenterologist if you have not heard about the biopsies in 3 weeks.  Our staff will call the home number listed on your records the next business day following your procedure to check on you and address any questions or concerns that you may have at that time regarding the information given to you following your procedure. This is a courtesy call and so if there is no answer at the home number and we have not heard from you through the emergency physician on call, we will assume that you have returned to your regular daily activities without incident.  SIGNATURES/CONFIDENTIALITY: You and/or your care partner have signed paperwork which will be entered into your electronic medical record.  These signatures attest to the fact that that the information above on your After Visit Summary has been reviewed and is understood.  Full responsibility of the confidentiality   of this discharge information lies with you and/or your care-partner.  Polyp information given.  Dr. Ardis Hughs will let you know when to have your next colonoscopy after he recieves pathology reports.

## 2013-12-30 NOTE — Progress Notes (Signed)
Called to room to assist during endoscopic procedure.  Patient ID and intended procedure confirmed with present staff. Received instructions for my participation in the procedure from the performing physician.  

## 2013-12-30 NOTE — Progress Notes (Signed)
A/ox3 pleased with MAC, report to Jane RN 

## 2013-12-30 NOTE — Op Note (Signed)
[place]  [address] [place], [id number]   COLONOSCOPY PROCEDURE REPORT  PATIENT: Mercedes [name], Mercedes [name]  MR#: [id number] BIRTHDATE: July 30, 1945 , 16  yrs. old GENDER: Female ENDOSCOPIST: Milus Banister, MD REFERRED SF:KCLEXN Duncan, M.D. PROCEDURE DATE:  12/30/2013 PROCEDURE:   Colonoscopy with snare polypectomy First Screening Colonoscopy - Avg.  risk and is 50 yrs.  old or older - No.  Prior Negative Screening - Now for repeat screening. N/A  History of Adenoma - Now for follow-up colonoscopy & has been > or = to 3 yrs.  N/A  Polyps Removed Today? Yes. ASA CLASS:   Class II INDICATIONS:FOBT positive stool. MEDICATIONS: MAC sedation, administered by CRNA and propofol (Diprivan) 200mg  IV  DESCRIPTION OF PROCEDURE:   After the risks benefits and alternatives of the procedure were thoroughly explained, informed consent was obtained.  A digital rectal exam revealed no abnormalities of the rectum.   The LB [id number]  endoscope was introduced through the anus and advanced to the cecum, which was identified by both the appendix and ileocecal valve. No adverse events experienced.   The quality of the prep was excellent.  The instrument was then slowly withdrawn as the colon was fully examined.  COLON FINDINGS: Six polyps were found, removed and all were sent to pathology.  Three of them were sessile, 3-70mm across, located in ascendind and transverse segments, removed with cold snare (jar 1). Two others were also sessile, located in descending and sigmoid segments, 3-14mm across, removed with cold snare (jar 2).  The last was pedunculated 1.2cm, located in sigmoid, removed with snare/cautery (jar 3).  There were external hemorrhoids and left sided diverticulosis.  The examination was otherwise normal. Retroflexed views revealed no abnormalities. The time to cecum=1 minutes 30 seconds.  Withdrawal time=11 minutes 28 seconds.  The scope was withdrawn and the  procedure completed. COMPLICATIONS: There were no complications.  ENDOSCOPIC IMPRESSION: Six polyps were found, removed and all were sent to pathology. There were external hemorrhoids and left sided diverticulosis.  The examination was otherwise normal.  RECOMMENDATIONS: If the polyp(s) removed today are proven to be adenomatous (pre-cancerous) polyps, you will need a colonoscopy in 3 years. Otherwise you should continue to follow colorectal cancer screening guidelines for "routine risk" patients with a colonoscopy in 10 years.  You will receive a letter within 1-2 weeks with the results of your biopsy as well as final recommendations.  Please call my office if you have not received a letter after 3 weeks.   eSigned:  Milus Banister, MD 12/30/2013 11:19 AM     PATIENT NAME:  Mercedes [name], Mercedes [name] MR#: [id number]

## 2013-12-30 NOTE — Progress Notes (Signed)
Patient denies any allergies to eggs or soy.  Patient denies any oxygen use at home and does not take any diet/weight loss medications. Patient is very anxious, HR 110. Pt denies any heart symptoms/problems at this time.

## 2013-12-31 ENCOUNTER — Telehealth: Payer: Self-pay | Admitting: *Deleted

## 2013-12-31 NOTE — Telephone Encounter (Signed)
No answer, left message to call office if questions or concerns. 

## 2014-01-04 ENCOUNTER — Other Ambulatory Visit (INDEPENDENT_AMBULATORY_CARE_PROVIDER_SITE_OTHER): Payer: Medicare Other

## 2014-01-04 LAB — CALCIUM: Calcium: 9.5 mg/dL (ref 8.4–10.5)

## 2014-01-10 ENCOUNTER — Encounter: Payer: Self-pay | Admitting: Gastroenterology

## 2014-01-29 ENCOUNTER — Ambulatory Visit (INDEPENDENT_AMBULATORY_CARE_PROVIDER_SITE_OTHER): Payer: Medicare Other | Admitting: Family Medicine

## 2014-01-29 ENCOUNTER — Encounter: Payer: Self-pay | Admitting: Family Medicine

## 2014-01-29 VITALS — BP 128/68 | HR 82 | Temp 98.5°F | Ht 64.0 in | Wt 215.8 lb

## 2014-01-29 DIAGNOSIS — H6120 Impacted cerumen, unspecified ear: Secondary | ICD-10-CM | POA: Insufficient documentation

## 2014-01-29 DIAGNOSIS — H6122 Impacted cerumen, left ear: Secondary | ICD-10-CM | POA: Insufficient documentation

## 2014-01-29 DIAGNOSIS — H6980 Other specified disorders of Eustachian tube, unspecified ear: Secondary | ICD-10-CM

## 2014-01-29 DIAGNOSIS — H6123 Impacted cerumen, bilateral: Secondary | ICD-10-CM

## 2014-01-29 DIAGNOSIS — H6982 Other specified disorders of Eustachian tube, left ear: Secondary | ICD-10-CM

## 2014-01-29 MED ORDER — FLUTICASONE PROPIONATE 50 MCG/ACT NA SUSP: 2.0000 | Freq: Every day | NASAL | Status: DC

## 2014-01-29 NOTE — Assessment & Plan Note (Signed)
Bilateral  Pt will use debrox twice weekly and f/u in 2 wk for irrigation  This should improve hearing

## 2014-01-29 NOTE — Patient Instructions (Signed)
Buy debrox solution (or any other ear wax softening solution) - and use it as directed in both ears at least twice per week  Use flonase nasal spray as directed Follow up with Dr Damita Dunnings in approx 2 weeks for further evaluation and ear irrigation  If symptoms worsen please let us know

## 2014-01-29 NOTE — Progress Notes (Signed)
Subjective:    Patient ID: Mercedes [name], female    DOB: October 25, 1945, 68 y.o.   MRN: [id number]  HPI Here with some ear pressure on the L and other symptoms   L ear is full/uncomfortable  (amplified noise to chew or swallow) , and dec hearing to outside noise, also a little dull pain   Feels dizzy when she turns her head-like the room is spinning/ vertigo  No vision problem - but both eyes are watering (thinks that is a separate issue with her contacts)   Head is not hurting   No cold/ but she has seasonal allergies (not severe)  No cough No fever  No swimming   Patient Active Problem List   Diagnosis Date Noted  . Hypercalcemia 12/01/2013  . Hyperglycemia 10/02/2012  . Medicare annual wellness visit, initial 10/02/2012  . Leg pain 07/09/2012  . Colon cancer screening 09/02/2011  . Osteoporosis screening 09/02/2011  . Obesity 11/08/2010  . Gout   . HLD (hyperlipidemia)   . HYPOTHYROIDISM NOS 01/10/2007  . HYPERTENSION, BENIGN ESSENTIAL 01/10/2007  . DIVERTICULOSIS, COLON 01/10/2007  . CHOLELITHIASIS 01/10/2007  . MENOPAUSAL SYNDROME 01/10/2007  . OSTEOARTHRITIS 01/10/2007   Past Medical History  Diagnosis Date  . Diverticulosis of colon   . Gout   . HLD (hyperlipidemia)   . HTN (hypertension)   . Hypothyroidism   . Osteoarthritis   . Tobacco abuse   . Fatty liver   . Allergy   . GERD (gastroesophageal reflux disease)    Past Surgical History  Procedure Laterality Date  . Sigmoidoscopy    . No past surgeries     History  Substance Use Topics  . Smoking status: Former Smoker    Quit date: 07/04/2010  . Smokeless tobacco: Never Used     Comment: 5 pyh current smoker, 1/2 PPD, started ~18 YOA, quit 10 years, restarted ~1 year ago  . Alcohol Use: Yes     Comment: Wine- occasionally   Family History  Problem Relation Age of Onset  . Ulcers Father   . Angina Mother   . Cancer Brother   . Hypertension Brother   . Prostate cancer Brother   . Colon  cancer Neg Hx   . Breast cancer Neg Hx   . Esophageal cancer Neg Hx   . Stomach cancer Neg Hx   . Rectal cancer Neg Hx    Allergies  Allergen Reactions  . Ace Inhibitors     REACTION: cough  . Clarithromycin     REACTION: GI upset  . Fenofibrate     REACTION: itching- pt unsure  . Lipitor [Atorvastatin]     myalgias  . Niacin     "burning", made pt pass out  . Penicillins     REACTION: Purpura  . Sulfasalazine     REACTION: Purpura  . Tetracycline Hcl     REACTION: GI upset   Current Outpatient Prescriptions on File Prior to Visit  Medication Sig Dispense Refill  . colchicine (COLCRYS) 0.6 MG tablet Take 1 tablet (0.6 mg total) by mouth 2 (two) times daily as needed.  90 tablet  1  . levothyroxine (SYNTHROID, LEVOTHROID) 125 MCG tablet Take 1 tablet (125 mcg total) by mouth daily before breakfast. Take with 86mcg daily.  90 tablet  3  . levothyroxine (SYNTHROID, LEVOTHROID) 88 MCG tablet Take 1 tablet (88 mcg total) by mouth daily before breakfast. Take with 125 mcg daily.  90 tablet  3  . metoprolol (TOPROL-XL)  200 MG 24 hr tablet Take 1 tablet (200 mg total) by mouth daily.  90 tablet  3  . Multiple Vitamin (MULTIVITAMIN) tablet Take 1 tablet by mouth daily.        . Omega-3 Fatty Acids (FISH OIL) 1200 MG CAPS Take 1,000 mg by mouth daily.       . pravastatin (PRAVACHOL) 40 MG tablet Take 1 tablet (40 mg total) by mouth daily.  90 tablet  3   No current facility-administered medications on file prior to visit.      Review of Systems Review of Systems  Constitutional: Negative for fever, appetite change, fatigue and unexpected weight change. ENT neg for congestion or sinus pain , pos for ear fullness  neg for ear drainage  Eyes: Negative for pain and visual disturbance.  Respiratory: Negative for cough and shortness of breath.   Cardiovascular: Negative for cp or palpitations    Gastrointestinal: Negative for nausea, diarrhea and constipation.  Genitourinary: Negative  for urgency and frequency.  Skin: Negative for pallor or rash   Neurological: Negative for weakness, light-headedness, numbness and headaches.  Hematological: Negative for adenopathy. Does not bruise/bleed easily.  Psychiatric/Behavioral: Negative for dysphoric mood. The patient is not nervous/anxious.         Objective:   Physical Exam  Constitutional: She appears well-developed and well-nourished. No distress.  obese and well appearing   HENT:  Head: Normocephalic and atraumatic.  Mouth/Throat: Oropharynx is clear and moist.  Nares are boggy but clear   bilat cerumen impaction (almost total) Canals are not swollen or erythematous and no drainage  No sinus pain   Eyes: Conjunctivae and EOM are normal. Pupils are equal, round, and reactive to light.  Neck: Normal range of motion. Neck supple. Carotid bruit is not present.  Cardiovascular: Normal rate and regular rhythm.   Pulmonary/Chest: Effort normal and breath sounds normal.  Lymphadenopathy:    She has no cervical adenopathy.  Neurological: She is alert. She has normal reflexes.  Skin: Skin is warm and dry. No rash noted. No erythema.  Psychiatric: She has a normal mood and affect.          Assessment & Plan:   Problem List Items Addressed This Visit     Nervous and Auditory   ETD (eustachian tube dysfunction) - Primary     Symptoms of ear fullness / dec hearing on L and vertigo (mild)  Also cerumen impaction-cannot vis TM Will try flonase ns to help suspected ETD and also debrox - to loosen cerumen Then f/u PCP for irrigation     Cerumen impaction     Bilateral  Pt will use debrox twice weekly and f/u in 2 wk for irrigation  This should improve hearing

## 2014-01-29 NOTE — Assessment & Plan Note (Signed)
Symptoms of ear fullness / dec hearing on L and vertigo (mild)  Also cerumen impaction-cannot vis TM Will try flonase ns to help suspected ETD and also debrox - to loosen cerumen Then f/u PCP for irrigation

## 2014-01-29 NOTE — Progress Notes (Signed)
Pre visit review using our clinic review tool, if applicable. No additional management support is needed unless otherwise documented below in the visit note. 

## 2014-02-12 ENCOUNTER — Encounter: Payer: Self-pay | Admitting: Family Medicine

## 2014-02-12 ENCOUNTER — Ambulatory Visit (INDEPENDENT_AMBULATORY_CARE_PROVIDER_SITE_OTHER): Payer: Medicare Other | Admitting: Family Medicine

## 2014-02-12 VITALS — BP 142/80 | HR 76 | Temp 98.1°F | Wt 214.5 lb

## 2014-02-12 DIAGNOSIS — H6980 Other specified disorders of Eustachian tube, unspecified ear: Secondary | ICD-10-CM

## 2014-02-12 DIAGNOSIS — H6983 Other specified disorders of Eustachian tube, bilateral: Secondary | ICD-10-CM

## 2014-02-12 MED ORDER — FLUTICASONE PROPIONATE 50 MCG/ACT NA SUSP: 2.0000 | Freq: Two times a day (BID) | NASAL | Status: DC

## 2014-02-12 NOTE — Patient Instructions (Signed)
Use the flonase in each nostril twice a day for a few days and gently pop your ears.

## 2014-02-12 NOTE — Progress Notes (Signed)
Pre visit review using our clinic review tool, if applicable. No additional management support is needed unless otherwise documented below in the visit note.  No pain.  Still feels like she is underwater with L ear.  Fullness in L ear.  No R ear sx.  No FCNAVD.  Recently started on flonase.  Taking clearquil in the meantime with some transient relief.  Meds, vitals, and allergies reviewed.   ROS: See HPI.  Otherwise, noncontributory.  nad ncat L canal with wax present, easily removed with irritation. No complications.  TM B wnl except for B ETD Nasal and OP exam wnl Rinne wnl B, weber louder on L ear

## 2014-02-14 NOTE — Assessment & Plan Note (Signed)
Gently try to pop ears, continue flonase for now at higher dose and report back as needed.  She agrees, d/w pt.

## 2014-02-15 ENCOUNTER — Ambulatory Visit (INDEPENDENT_AMBULATORY_CARE_PROVIDER_SITE_OTHER): Payer: Medicare Other | Admitting: Family Medicine

## 2014-02-15 ENCOUNTER — Encounter: Payer: Self-pay | Admitting: Family Medicine

## 2014-02-15 ENCOUNTER — Telehealth: Payer: Self-pay | Admitting: Family Medicine

## 2014-02-15 VITALS — BP 142/70 | HR 67 | Temp 98.3°F | Wt 212.5 lb

## 2014-02-15 DIAGNOSIS — R109 Unspecified abdominal pain: Secondary | ICD-10-CM

## 2014-02-15 DIAGNOSIS — K8020 Calculus of gallbladder without cholecystitis without obstruction: Secondary | ICD-10-CM

## 2014-02-15 NOTE — Telephone Encounter (Signed)
3:45 today?  Thanks.

## 2014-02-15 NOTE — Telephone Encounter (Signed)
Patient Information:  Caller Name: Katja  Phone: [phone]  Patient: Mercedes [name], Mercedes [name]  Gender: Female  DOB: 1946/05/04  Age: 68 Years  PCP: Elsie Stain Brigitte Pulse) [facility])  Office Follow Up:  Does the office need to follow up with this patient?: Yes  Instructions For The Office: Pt requested to be notified today if there were any appointment cancellations.   Symptoms  Reason For Call & Symptoms: Pt is calling to say that she has had intermittent chest pain/indigestion. The pain radiates to her back. She has none today and has had none in the past 3 days. She has had a clay coloured stool. And has intermittent discomfort/tenderness in her right lower abdomen. Pt has a hx of gallstones. She wants to know if she should be seen for this. No symptoms since changing her diet.  Reviewed Health History In EMR: Yes  Reviewed Medications In EMR: Yes  Reviewed Allergies In EMR: Yes  Reviewed Surgeries / Procedures: Yes  Date of Onset of Symptoms: 02/11/2014  Treatments Tried: pt is only eating mashed potatoes and rice.  Treatments Tried Worked: No  Guideline(s) Used:  Abdominal Pain - Female  Chest Pain  Disposition Per Guideline:   See Today in Office  Reason For Disposition Reached:   Intermittent chest pains persist > 3 days  Advice Given:  N/A  Patient Refused Recommendation:  Patient Will Make Own Appointment  Pt will only see Dr. Damita Dunnings who is scheduled today. She refused to be scheduled with another MD despite disposition of "see today". RN scheduled pt for 12:15 tomorrow. She requested to be called in MD had any cancellations today.  Appointment Scheduled:  02/16/2014 12:15:00 Appointment Scheduled Provider:  Elsie Stain Brigitte Pulse) [facility])

## 2014-02-15 NOTE — Progress Notes (Signed)
Pre visit review using our clinic review tool, if applicable. No additional management support is needed unless otherwise documented below in the visit note.  After leaving the office at the last visit, she had some indigesting, took zantac w/o relief.  tums didn't help a lot.  Then later on had chest pain.  Better standing up.  Lasted about 3-4 hours.  Went to her back and R shoulder blade.  She had had antecedent indigestion over the last few months that would get better with zantac or tums.  She prev had some RLQ tenderness over the last few months.  No nausea, no vomiting.  Not dizzy, no fevers.  No jaundice.  Gall stones noted on CT 2007.  One stool was clay like, whitish, no more in the meantime.  No abd pain.  No pressure or tenderness now, not unless she really pushed hard on her abd.    Not SOB.  No typical, exertional CP.  Abd sx were worse after eating fatty foods, better in the meantime with fatty food avoidance.    Meds, vitals, and allergies reviewed.   ROS: See HPI.  Otherwise, noncontributory.  nad ncat Mmm Neck supple, no LA rrr ctab abd soft, RLQ mildly ttp w/o rebound, normal BS RUQ not ttp

## 2014-02-15 NOTE — Telephone Encounter (Signed)
Patient contacted and scheduled.

## 2014-02-15 NOTE — Telephone Encounter (Signed)
Lugene,   Will you make sure that Dr. Damita Dunnings sees this before the end of day in case I don't see him?  Thanks.

## 2014-02-15 NOTE — Patient Instructions (Signed)
Avoid fatty foods.  Mercedes [name] will call about your referral. If you have severe pain, then go to the ER. Take care.

## 2014-02-16 ENCOUNTER — Ambulatory Visit: Payer: Self-pay | Admitting: Family Medicine

## 2014-02-16 ENCOUNTER — Ambulatory Visit
Admission: RE | Admit: 2014-02-16 | Discharge: 2014-02-16 | Disposition: A | Discharge status: Home / Self Care | Payer: Medicare Other | Source: Ambulatory Visit | Attending: Family Medicine | Admitting: Family Medicine

## 2014-02-16 ENCOUNTER — Other Ambulatory Visit: Payer: Self-pay | Admitting: Family Medicine

## 2014-02-16 DIAGNOSIS — K8020 Calculus of gallbladder without cholecystitis without obstruction: Secondary | ICD-10-CM | POA: Insufficient documentation

## 2014-02-16 DIAGNOSIS — R109 Unspecified abdominal pain: Secondary | ICD-10-CM

## 2014-02-16 NOTE — Assessment & Plan Note (Signed)
RLQ, not RUQ pain.  Mild pain, benign exam.  Would still check u/s.  Sx improved with less fat in diet.  D/w pt.  If worse, to ER.  She agrees.

## 2014-03-04 ENCOUNTER — Ambulatory Visit (INDEPENDENT_AMBULATORY_CARE_PROVIDER_SITE_OTHER): Payer: Medicare Other | Admitting: General Surgery

## 2014-03-04 ENCOUNTER — Other Ambulatory Visit (INDEPENDENT_AMBULATORY_CARE_PROVIDER_SITE_OTHER): Payer: Self-pay | Admitting: General Surgery

## 2014-03-15 ENCOUNTER — Encounter (HOSPITAL_COMMUNITY): Payer: Self-pay | Admitting: Pharmacy Technician

## 2014-03-15 NOTE — H&P (Signed)
Mercedes [name]  03/04/2014 3:10 PM Location: [facility] Patient #: [id number] DOB: September 30, 1945 Married / Language: Cleophus Molt / Race: White Female  History of Present Illness Renelda Loma M. Dalbert Batman MD; 03/04/2014 3:47 PM) Patient words: New patient /GALLSTONES.  The patient is a 68 year old female who presents for evaluation of gall stones. She is referred by Dr. Elsie Stain for evaluation of symptomatic gallstones. The patient states that gallstones were noted on imaging studies in the past but she has not been symptomatic. For the past several months, however she has developed some substernal burning discomfort like heartburn but has not had any nausea or vomiting. Zantac helped for a while. More recently she's had several bouts of substernal epigastric and chest pain radiating to the back and the right shoulder blade. This will also be postprandial and she thinks she has fatty food intolerance as it triggers this. Bowel movements have not changed although she states that she had one clay-colored stool. Otherwise normal. Denies nausea vomiting. Denies dysphagia. She is in no distress today.Prior lab works showed intermittent borderline elevation of transaminases. Recent ultrasound shows one gallstone at least, 1.9 cm, fatty liver. PH notable for hypertension, gout, hypothyroidism, and mild obesity. Mother had a gangrenous gallbladder but lived to age 52.   Other Problems Zenovia Jordan, LPN; 0/03/3234 5:73 PM) Arthritis Back Pain Chest pain Cholelithiasis Gastroesophageal Reflux Disease Hemorrhoids High blood pressure Thyroid Disease  Past Surgical History Zenovia Jordan, LPN; 08/03/252 2:70 PM) Colon Polyp Removal - Colonoscopy  Diagnostic Studies History Zenovia Jordan, LPN; 12/01/3760 8:31 PM) Colonoscopy within last year Mammogram within last year Pap Smear 1-5 years ago  Social History Zenovia Jordan, LPN; 10/31/7614 0:73 PM) Alcohol use Moderate alcohol  use. Caffeine use Carbonated beverages, Tea. No drug use Tobacco use Former smoker.  Family History Zenovia Jordan, LPN; 12/30/624 9:48 PM) Arthritis Mother. Heart Disease Brother, Mother. Heart disease in female family member before age 30 Heart disease in female family member before age 55 Migraine Headache Mother. Thyroid problems Mother.  Pregnancy / Birth History Zenovia Jordan, LPN; 11/02/6268 3:50 PM) Age at menarche 59 years. Age of menopause 49-55 Contraceptive History Oral contraceptives. Gravida 2 Maternal age 56-30 Para 2  Review of Systems Zenovia Jordan LPN; 0/03/3817 2:99 PM) General Not Present- Appetite Loss, Chills, Fatigue, Fever, Night Sweats, Weight Gain and Weight Loss. Skin Present- Dryness. Not Present- Change in Wart/Mole, Hives, Jaundice, New Lesions, Non-Healing Wounds, Rash and Ulcer. HEENT Present- Seasonal Allergies and Wears glasses/contact lenses. Not Present- Earache, Hearing Loss, Hoarseness, Nose Bleed, Oral Ulcers, Ringing in the Ears, Sinus Pain, Sore Throat, Visual Disturbances and Yellow Eyes. Cardiovascular Present- Chest Pain and Rapid Heart Rate. Not Present- Difficulty Breathing Lying Down, Leg Cramps, Palpitations, Shortness of Breath and Swelling of Extremities. Gastrointestinal Present- Bloating, Excessive gas, Hemorrhoids and Indigestion. Not Present- Abdominal Pain, Bloody Stool, Change in Bowel Habits, Chronic diarrhea, Constipation, Difficulty Swallowing, Gets full quickly at meals, Nausea, Rectal Pain and Vomiting. Musculoskeletal Present- Back Pain, Joint Pain, Joint Stiffness and Muscle Weakness. Not Present- Muscle Pain and Swelling of Extremities. Endocrine Present- Heat Intolerance. Not Present- Cold Intolerance, Excessive Hunger, Hair Changes, Hot flashes and New Diabetes.   Vitals Holley Raring Kendrick LPN; 09/06/1694 7:89 PM) 03/04/2014 3:11 PM Weight: 213 lb Height: 65in Body Surface Area: 2.1 m Body Mass  Index: 35.44 kg/m Temp.: 64F  Resp.: 18 (Unlabored)  BP: 132/70 (Sitting, Left Arm, Standard)    Physical Exam Renelda Loma M. Dalbert Batman MD; 03/04/2014 3:48 PM) General Mental  Status-Alert. General Appearance-Consistent with stated age. Hydration-Well hydrated. Voice-Normal. Note: Weight 212 pounds. Mild obesity.Husband present throughout encounter.   Head and Neck Head-normocephalic, atraumatic with no lesions or palpable masses. Trachea-midline. Thyroid Gland Characteristics - normal size and consistency.  Eye Eyeball - Bilateral-Extraocular movements intact. Sclera/Conjunctiva - Bilateral-No scleral icterus.  Chest and Lung Exam Chest and lung exam reveals -quiet, even and easy respiratory effort with no use of accessory muscles, normal resonance, no flatness or dullness, non-tender and normal tactile fremitus and on auscultation, normal breath sounds, no adventitious sounds and normal vocal resonance. Inspection Chest Wall - Normal. Back - normal.  Breast Breast - Left-Symmetric, Non Tender, No Biopsy scars, no Dimpling, No Inflammation, No Lumpectomy scars, No Mastectomy scars, No Peau d' Orange. Breast - Right-Symmetric, Non Tender, No Biopsy scars, no Dimpling, No Inflammation, No Lumpectomy scars, No Mastectomy scars, No Peau d' Orange. Breast Lump-No Palpable Breast Mass.  Cardiovascular Cardiovascular examination reveals -on palpation PMI is normal in location and amplitude, no palpable S3 or S4. Normal cardiac borders., normal heart sounds, regular rate and rhythm with no murmurs, carotid auscultation reveals no bruits and normal pedal pulses bilaterally.  Abdomen Inspection Inspection of the abdomen reveals - No Hernias. Skin - Scar - no surgical scars. Palpation/Percussion Palpation and Percussion of the abdomen reveal - Soft, Non Tender, No Rebound tenderness, No Rigidity (guarding) and No hepatosplenomegaly. Auscultation Auscultation  of the abdomen reveals - Bowel sounds normal. Note: Abdominal exam is unremarkable. No distention. No hepatomegaly. No hernias noted.   Rectal Anorectal Exam External - no external hemorrhoids. Internal - normal internal exam and normal sphincter tone. Proctoscopic exam-No Internal hemorrhoids.  Peripheral Vascular Upper Extremity Inspection - Bilateral - Normal - No Clubbing, No Cyanosis, No Edema, Pulses Intact. Palpation - Pulses bilaterally normal. Lower Extremity Palpation - Pulses bilaterally normal.  Neurologic Neurologic evaluation reveals -alert and oriented x 3 with no impairment of recent or remote memory. Mental Status-Normal.  Musculoskeletal Normal Exam - Left-Upper Extremity Strength Normal and Lower Extremity Strength Normal. Normal Exam - Right-Upper Extremity Strength Normal, Lower Extremity Weakness.  Lymphatic Head & Neck  General Head & Neck Lymphatics: Bilateral - Description - Normal. Axillary  General Axillary Region: Bilateral - Description - Normal. Tenderness - Non Tender. Femoral & Inguinal  Generalized Femoral & Inguinal Lymphatics: Bilateral - Description - Normal. Tenderness - Non Tender.    Assessment & Plan [facility] M. Dalbert Batman MD; 03/04/2014 3:55 PM) GALLSTONE (574.20  K80.20) Impression: Symptoms are consistent with intermittent biliary colic. Plan elective laparoscopic cholecystectomy with cholangiogram possible open Hopefully this can be done as an outpatient or for a 1 night stay. Current Plans  Schedule for Surgery: laparoscopic cholecystectomy with cholangiogram, possible open cholecystectomy GOUT, ARTHRITIS (274.00  M10.9) HYPOTHYROID (244.9  E03.9) OBESITY (BMI 30-39.9) (278.00  E66.9)    Signed by Adin Hector, MD (03/04/2014 3:58 PM)

## 2014-03-16 ENCOUNTER — Ambulatory Visit (HOSPITAL_COMMUNITY)
Admission: RE | Admit: 2014-03-16 | Discharge: 2014-03-16 | Disposition: A | Discharge status: Home / Self Care | Payer: Medicare Other | Source: Ambulatory Visit | Attending: Anesthesiology | Admitting: Anesthesiology

## 2014-03-16 ENCOUNTER — Encounter (HOSPITAL_COMMUNITY)
Admission: RE | Admit: 2014-03-16 | Discharge: 2014-03-16 | Disposition: A | Discharge status: Home / Self Care | Payer: Medicare Other | Source: Ambulatory Visit | Attending: General Surgery | Admitting: General Surgery

## 2014-03-16 ENCOUNTER — Encounter (HOSPITAL_COMMUNITY): Payer: Self-pay

## 2014-03-16 DIAGNOSIS — Z87891 Personal history of nicotine dependence: Secondary | ICD-10-CM | POA: Insufficient documentation

## 2014-03-16 DIAGNOSIS — K8020 Calculus of gallbladder without cholecystitis without obstruction: Secondary | ICD-10-CM | POA: Diagnosis present

## 2014-03-16 DIAGNOSIS — Z01818 Encounter for other preprocedural examination: Secondary | ICD-10-CM | POA: Diagnosis not present

## 2014-03-16 HISTORY — DX: Unspecified cataract: H26.9

## 2014-03-16 HISTORY — DX: Headache: R51

## 2014-03-16 HISTORY — DX: Tachycardia, unspecified: R00.0

## 2014-03-16 LAB — CBC WITH DIFFERENTIAL/PLATELET
Basophils Absolute: 0 10*3/uL (ref 0.0–0.1)
Basophils Relative: 1 % (ref 0–1)
Eosinophils Absolute: 0.1 10*3/uL (ref 0.0–0.7)
Eosinophils Relative: 2 % (ref 0–5)
HCT: 46.2 % — ABNORMAL HIGH (ref 36.0–46.0)
HEMOGLOBIN: 15.7 g/dL — AB (ref 12.0–15.0)
LYMPHS ABS: 2.9 10*3/uL (ref 0.7–4.0)
LYMPHS PCT: 38 % (ref 12–46)
MCH: 32.6 pg (ref 26.0–34.0)
MCHC: 34 g/dL (ref 30.0–36.0)
MCV: 95.9 fL (ref 78.0–100.0)
MONOS PCT: 11 % (ref 3–12)
Monocytes Absolute: 0.8 10*3/uL (ref 0.1–1.0)
NEUTROS PCT: 48 % (ref 43–77)
Neutro Abs: 3.7 10*3/uL (ref 1.7–7.7)
PLATELETS: 186 10*3/uL (ref 150–400)
RBC: 4.82 MIL/uL (ref 3.87–5.11)
RDW: 12.7 % (ref 11.5–15.5)
WBC: 7.6 10*3/uL (ref 4.0–10.5)

## 2014-03-16 LAB — COMPREHENSIVE METABOLIC PANEL
ALK PHOS: 75 U/L (ref 39–117)
ALT: 44 U/L — ABNORMAL HIGH (ref 0–35)
AST: 30 U/L (ref 0–37)
Albumin: 3.7 g/dL (ref 3.5–5.2)
Anion gap: 10 (ref 5–15)
BUN: 18 mg/dL (ref 6–23)
CO2: 28 meq/L (ref 19–32)
Calcium: 10.3 mg/dL (ref 8.4–10.5)
Chloride: 104 mEq/L (ref 96–112)
Creatinine, Ser: 0.75 mg/dL (ref 0.50–1.10)
GFR, EST NON AFRICAN AMERICAN: 84 mL/min — AB (ref >90)
GLUCOSE: 110 mg/dL — AB (ref 70–99)
POTASSIUM: 4.3 meq/L (ref 3.7–5.3)
Sodium: 142 mEq/L (ref 137–147)
Total Bilirubin: 1.4 mg/dL — ABNORMAL HIGH (ref 0.3–1.2)
Total Protein: 7.5 g/dL (ref 6.0–8.3)

## 2014-03-16 LAB — URINALYSIS, ROUTINE W REFLEX MICROSCOPIC
BILIRUBIN URINE: NEGATIVE
Glucose, UA: NEGATIVE mg/dL
HGB URINE DIPSTICK: NEGATIVE
KETONES UR: NEGATIVE mg/dL
Nitrite: NEGATIVE
Protein, ur: NEGATIVE mg/dL
SPECIFIC GRAVITY, URINE: 1.024 (ref 1.005–1.030)
UROBILINOGEN UA: 0.2 mg/dL (ref 0.0–1.0)
pH: 6 (ref 5.0–8.0)

## 2014-03-16 LAB — URINE MICROSCOPIC-ADD ON

## 2014-03-16 NOTE — Patient Instructions (Addendum)
Mercedes [name]  03/16/2014   Your procedure is scheduled on: Wednesday 03/17/14  Report to [place] at 10:15 AM.  Call this number if you have problems the morning of surgery [phone]: [phone]   Remember:   Do not eat food or drink liquids After Midnight.     Take these medicines the morning of surgery with A SIP OF WATER: zantac if needed, metoprolol, synthroid, flonase   Do not wear jewelry, make-up or nail polish.  Do not wear lotions, powders, or perfumes. You may wear deodorant.  Do not shave 48 hours prior to surgery. Men may shave face and neck.  Do not bring valuables to the hospital.  Contacts, dentures or bridgework may not be worn into surgery.    Patients discharged the day of surgery will not be allowed to drive home.  Name and phone number of your driver:Mercedes [name] (husband) [phone]  Paulette Blanch, RN  pre op nurse call if needed [phone]    [facility] - Preparing for Surgery Before surgery, you can play an important role.  Because skin is not sterile, your skin needs to be as free of germs as possible.  You can reduce the number of germs on your skin by washing with CHG (chlorahexidine gluconate) soap before surgery.  CHG is an antiseptic cleaner which kills germs and bonds with the skin to continue killing germs even after washing. Please DO NOT use if you have an allergy to CHG or antibacterial soaps.  If your skin becomes reddened/irritated stop using the CHG and inform your nurse when you arrive at Short Stay. Do not shave (including legs and underarms) for at least 48 hours prior to the first CHG shower.  You may shave your face/neck. Please follow these instructions carefully:  1.  Shower with CHG Soap the night before surgery and the  morning of Surgery.  2.  If you choose to wash your hair, wash your hair first as usual with your  normal  shampoo.  3.  After you shampoo, rinse your hair and body thoroughly to remove the  shampoo.                             4.  Use CHG as you would any other liquid soap.  You can apply chg directly  to the skin and wash                       Gently with a scrungie or clean washcloth.  5.  Apply the CHG Soap to your body ONLY FROM THE NECK DOWN.   Do not use on face/ open                           Wound or open sores. Avoid contact with eyes, ears mouth and genitals (private parts).                       Wash face,  Genitals (private parts) with your normal soap.             6.  Wash thoroughly, paying special attention to the area where your surgery  will be performed.  7.  Thoroughly rinse your body with warm water from the neck down.  8.  DO NOT shower/wash with your normal soap after using and rinsing off  the CHG  Soap.                9.  Pat yourself dry with a clean towel.            10.  Wear clean pajamas.            11.  Place clean sheets on your bed the night of your first shower and do not  sleep with pets. Day of Surgery : Do not apply any lotion the morning of surgery.  Please wear clean clothes to the hospital/surgery center.  FAILURE TO FOLLOW THESE INSTRUCTIONS MAY RESULT IN THE CANCELLATION OF YOUR SURGERY PATIENT SIGNATURE_________________________________  NURSE SIGNATURE__________________________________  ________________________________________________________________________

## 2014-03-17 ENCOUNTER — Ambulatory Visit (HOSPITAL_COMMUNITY)
Admission: RE | Admit: 2014-03-17 | Discharge: 2014-03-17 | Disposition: A | Discharge status: Home / Self Care | Payer: Medicare Other | Source: Ambulatory Visit | Attending: General Surgery | Admitting: General Surgery

## 2014-03-17 ENCOUNTER — Encounter (HOSPITAL_COMMUNITY)
Admission: RE | Disposition: A | Discharge status: Home / Self Care | Payer: Self-pay | Source: Ambulatory Visit | Attending: General Surgery

## 2014-03-17 ENCOUNTER — Ambulatory Visit (HOSPITAL_COMMUNITY): Payer: Medicare Other | Admitting: Certified Registered Nurse Anesthetist

## 2014-03-17 ENCOUNTER — Encounter (HOSPITAL_COMMUNITY): Payer: Medicare Other | Admitting: Certified Registered Nurse Anesthetist

## 2014-03-17 ENCOUNTER — Encounter (HOSPITAL_COMMUNITY): Payer: Self-pay | Admitting: *Deleted

## 2014-03-17 ENCOUNTER — Ambulatory Visit (HOSPITAL_COMMUNITY): Payer: Medicare Other

## 2014-03-17 DIAGNOSIS — E039 Hypothyroidism, unspecified: Secondary | ICD-10-CM | POA: Diagnosis not present

## 2014-03-17 DIAGNOSIS — K219 Gastro-esophageal reflux disease without esophagitis: Secondary | ICD-10-CM | POA: Insufficient documentation

## 2014-03-17 DIAGNOSIS — M109 Gout, unspecified: Secondary | ICD-10-CM | POA: Diagnosis not present

## 2014-03-17 DIAGNOSIS — K8020 Calculus of gallbladder without cholecystitis without obstruction: Secondary | ICD-10-CM | POA: Diagnosis present

## 2014-03-17 DIAGNOSIS — E669 Obesity, unspecified: Secondary | ICD-10-CM | POA: Insufficient documentation

## 2014-03-17 DIAGNOSIS — Z6830 Body mass index (BMI) 30.0-30.9, adult: Secondary | ICD-10-CM | POA: Insufficient documentation

## 2014-03-17 DIAGNOSIS — Z87891 Personal history of nicotine dependence: Secondary | ICD-10-CM | POA: Insufficient documentation

## 2014-03-17 DIAGNOSIS — K8010 Calculus of gallbladder with chronic cholecystitis without obstruction: Secondary | ICD-10-CM | POA: Diagnosis not present

## 2014-03-17 DIAGNOSIS — I10 Essential (primary) hypertension: Secondary | ICD-10-CM | POA: Diagnosis not present

## 2014-03-17 HISTORY — PX: CHOLECYSTECTOMY: SHX55

## 2014-03-17 SURGERY — LAPAROSCOPIC CHOLECYSTECTOMY WITH INTRAOPERATIVE CHOLANGIOGRAM
Anesthesia: General | Site: Abdomen

## 2014-03-17 MED ORDER — GLYCOPYRROLATE 0.2 MG/ML IJ SOLN
INTRAMUSCULAR | Status: AC
Start: 2014-03-17 — End: 2014-03-17
  Filled 2014-03-17: qty 3

## 2014-03-17 MED ORDER — SODIUM CHLORIDE 0.9 % IV SOLN: 250.0000 mL | INTRAVENOUS | Status: DC | PRN

## 2014-03-17 MED ORDER — LACTATED RINGERS IR SOLN
Status: DC | PRN
  Administered 2014-03-17: 1

## 2014-03-17 MED ORDER — FENTANYL CITRATE 0.05 MG/ML IJ SOLN: 25.0000 ug | INTRAMUSCULAR | Status: DC | PRN

## 2014-03-17 MED ORDER — MIDAZOLAM HCL 5 MG/5ML IJ SOLN
INTRAMUSCULAR | Status: DC | PRN
  Administered 2014-03-17: 2 mg via INTRAVENOUS

## 2014-03-17 MED ORDER — HYDROMORPHONE HCL 1 MG/ML IJ SOLN: 0.2500 mg | INTRAMUSCULAR | Status: DC | PRN

## 2014-03-17 MED ORDER — NEOSTIGMINE METHYLSULFATE 10 MG/10ML IV SOLN
INTRAVENOUS | Status: AC
  Filled 2014-03-17: qty 1

## 2014-03-17 MED ORDER — ONDANSETRON HCL 4 MG/2ML IJ SOLN
INTRAMUSCULAR | Status: DC | PRN
  Administered 2014-03-17: 4 mg via INTRAVENOUS

## 2014-03-17 MED ORDER — ROCURONIUM BROMIDE 100 MG/10ML IV SOLN
INTRAVENOUS | Status: AC
  Filled 2014-03-17: qty 1

## 2014-03-17 MED ORDER — BUPIVACAINE-EPINEPHRINE 0.5% -1:200000 IJ SOLN
INTRAMUSCULAR | Status: DC | PRN
  Administered 2014-03-17: 23 mL

## 2014-03-17 MED ORDER — FENTANYL CITRATE 0.05 MG/ML IJ SOLN
INTRAMUSCULAR | Status: AC
  Filled 2014-03-17: qty 5

## 2014-03-17 MED ORDER — PHENYLEPHRINE HCL 10 MG/ML IJ SOLN
INTRAMUSCULAR | Status: DC | PRN
  Administered 2014-03-17: 80 ug via INTRAVENOUS

## 2014-03-17 MED ORDER — SODIUM CHLORIDE 0.9 % IJ SOLN: 3.0000 mL | INTRAMUSCULAR | Status: DC | PRN

## 2014-03-17 MED ORDER — PROPOFOL 10 MG/ML IV BOLUS
INTRAVENOUS | Status: AC
  Filled 2014-03-17: qty 20

## 2014-03-17 MED ORDER — DEXAMETHASONE SODIUM PHOSPHATE 10 MG/ML IJ SOLN
INTRAMUSCULAR | Status: DC | PRN
  Administered 2014-03-17: 10 mg via INTRAVENOUS

## 2014-03-17 MED ORDER — HYDROCODONE-ACETAMINOPHEN 5-325 MG PO TABS: 1.0000 | ORAL_TABLET | Freq: Four times a day (QID) | ORAL | Status: DC | PRN

## 2014-03-17 MED ORDER — LIDOCAINE HCL (CARDIAC) 20 MG/ML IV SOLN
INTRAVENOUS | Status: DC | PRN
  Administered 2014-03-17: 100 mg via INTRAVENOUS

## 2014-03-17 MED ORDER — MIDAZOLAM HCL 2 MG/2ML IJ SOLN
INTRAMUSCULAR | Status: AC
  Filled 2014-03-17: qty 2

## 2014-03-17 MED ORDER — SODIUM CHLORIDE 0.9 % IJ SOLN: 3.0000 mL | Freq: Two times a day (BID) | INTRAMUSCULAR | Status: DC

## 2014-03-17 MED ORDER — ONDANSETRON HCL 4 MG/2ML IJ SOLN
INTRAMUSCULAR | Status: AC
  Filled 2014-03-17: qty 2

## 2014-03-17 MED ORDER — IOHEXOL 300 MG/ML  SOLN
INTRAMUSCULAR | Status: DC | PRN
  Administered 2014-03-17: 5 mL

## 2014-03-17 MED ORDER — SODIUM CHLORIDE 0.9 % IV SOLN: INTRAVENOUS | Status: DC

## 2014-03-17 MED ORDER — KETOROLAC TROMETHAMINE 30 MG/ML IJ SOLN: 15.0000 mg | Freq: Once | INTRAMUSCULAR | Status: DC | PRN

## 2014-03-17 MED ORDER — ROCURONIUM BROMIDE 100 MG/10ML IV SOLN
INTRAVENOUS | Status: DC | PRN
  Administered 2014-03-17: 35 mg via INTRAVENOUS

## 2014-03-17 MED ORDER — 0.9 % SODIUM CHLORIDE (POUR BTL) OPTIME
TOPICAL | Status: DC | PRN
Start: 2014-03-17 — End: 2014-03-17
  Administered 2014-03-17: 1000 mL

## 2014-03-17 MED ORDER — FENTANYL CITRATE 0.05 MG/ML IJ SOLN
INTRAMUSCULAR | Status: DC | PRN
  Administered 2014-03-17 (×5): 50 ug via INTRAVENOUS

## 2014-03-17 MED ORDER — PROPOFOL 10 MG/ML IV BOLUS
INTRAVENOUS | Status: DC | PRN
  Administered 2014-03-17: 150 mg via INTRAVENOUS

## 2014-03-17 MED ORDER — DEXAMETHASONE SODIUM PHOSPHATE 10 MG/ML IJ SOLN
INTRAMUSCULAR | Status: AC
  Filled 2014-03-17: qty 1

## 2014-03-17 MED ORDER — SUCCINYLCHOLINE CHLORIDE 20 MG/ML IJ SOLN
INTRAMUSCULAR | Status: DC | PRN
Start: 2014-03-17 — End: 2014-03-17
  Administered 2014-03-17: 100 mg via INTRAVENOUS

## 2014-03-17 MED ORDER — METOCLOPRAMIDE HCL 5 MG/ML IJ SOLN
INTRAMUSCULAR | Status: DC | PRN
  Administered 2014-03-17: 10 mg via INTRAVENOUS

## 2014-03-17 MED ORDER — LACTATED RINGERS IV SOLN
INTRAVENOUS | Status: DC
  Administered 2014-03-17: 1000 mL via INTRAVENOUS

## 2014-03-17 MED ORDER — NEOSTIGMINE METHYLSULFATE 10 MG/10ML IV SOLN
INTRAVENOUS | Status: DC | PRN
  Administered 2014-03-17: 5 mg via INTRAVENOUS

## 2014-03-17 MED ORDER — OXYCODONE HCL 5 MG PO TABS: 5.0000 mg | ORAL_TABLET | ORAL | Status: DC | PRN

## 2014-03-17 MED ORDER — LIDOCAINE HCL (CARDIAC) 20 MG/ML IV SOLN
INTRAVENOUS | Status: AC
  Filled 2014-03-17: qty 5

## 2014-03-17 MED ORDER — BUPIVACAINE-EPINEPHRINE (PF) 0.5% -1:200000 IJ SOLN
INTRAMUSCULAR | Status: AC
  Filled 2014-03-17: qty 30

## 2014-03-17 MED ORDER — CEFAZOLIN SODIUM-DEXTROSE 2-3 GM-% IV SOLR
2.0000 g | INTRAVENOUS | Status: AC
  Administered 2014-03-17: 2 g via INTRAVENOUS

## 2014-03-17 MED ORDER — ACETAMINOPHEN 650 MG RE SUPP
650.0000 mg | RECTAL | Status: DC | PRN
  Filled 2014-03-17: qty 1

## 2014-03-17 MED ORDER — ACETAMINOPHEN 325 MG PO TABS: 650.0000 mg | ORAL_TABLET | ORAL | Status: DC | PRN

## 2014-03-17 MED ORDER — METOCLOPRAMIDE HCL 5 MG/ML IJ SOLN
INTRAMUSCULAR | Status: AC
  Filled 2014-03-17: qty 2

## 2014-03-17 MED ORDER — GLYCOPYRROLATE 0.2 MG/ML IJ SOLN
INTRAMUSCULAR | Status: DC | PRN
  Administered 2014-03-17: 0.6 mg via INTRAVENOUS

## 2014-03-17 MED ORDER — CEFAZOLIN SODIUM-DEXTROSE 2-3 GM-% IV SOLR
INTRAVENOUS | Status: AC
  Filled 2014-03-17: qty 50

## 2014-03-17 MED ORDER — PROMETHAZINE HCL 25 MG/ML IJ SOLN: 6.2500 mg | INTRAMUSCULAR | Status: DC | PRN

## 2014-03-17 SURGICAL SUPPLY — 37 items
ADH SKN CLS APL DERMABOND .7 (GAUZE/BANDAGES/DRESSINGS) ×1
APL SKNCLS STERI-STRIP NONHPOA (GAUZE/BANDAGES/DRESSINGS)
APPLIER CLIP ROT 10 11.4 M/L (STAPLE) ×3
APR CLP MED LRG 11.4X10 (STAPLE) ×1
BAG SPEC RTRVL LRG 6X4 10 (ENDOMECHANICALS) ×1
BENZOIN TINCTURE PRP APPL 2/3 (GAUZE/BANDAGES/DRESSINGS) IMPLANT
CANISTER SUCTION 2500CC (MISCELLANEOUS) ×3 IMPLANT
CLIP APPLIE ROT 10 11.4 M/L (STAPLE) ×1 IMPLANT
CLOSURE WOUND 1/2 X4 (GAUZE/BANDAGES/DRESSINGS)
COVER MAYO STAND STRL (DRAPES) ×3 IMPLANT
DECANTER SPIKE VIAL GLASS SM (MISCELLANEOUS) ×3 IMPLANT
DERMABOND ADVANCED (GAUZE/BANDAGES/DRESSINGS) ×2
DERMABOND ADVANCED .7 DNX12 (GAUZE/BANDAGES/DRESSINGS) ×1 IMPLANT
DRAPE C-ARM 42X120 X-RAY (DRAPES) ×3 IMPLANT
DRAPE LAPAROSCOPIC ABDOMINAL (DRAPES) ×3 IMPLANT
ELECT REM PT RETURN 9FT ADLT (ELECTROSURGICAL) ×3
ELECTRODE REM PT RTRN 9FT ADLT (ELECTROSURGICAL) ×1 IMPLANT
GLOVE EUDERMIC 7 POWDERFREE (GLOVE) ×3 IMPLANT
GOWN STRL REUS W/TWL XL LVL3 (GOWN DISPOSABLE) ×12 IMPLANT
HEMOSTAT SNOW SURGICEL 2X4 (HEMOSTASIS) IMPLANT
KIT BASIN OR (CUSTOM PROCEDURE TRAY) ×3 IMPLANT
POUCH SPECIMEN RETRIEVAL 10MM (ENDOMECHANICALS) ×3 IMPLANT
SCISSORS LAP 5X35 DISP (ENDOMECHANICALS) ×3 IMPLANT
SET CHOLANGIOGRAPH MIX (MISCELLANEOUS) ×3 IMPLANT
SET IRRIG TUBING LAPAROSCOPIC (IRRIGATION / IRRIGATOR) ×3 IMPLANT
SLEEVE XCEL OPT CAN 5 100 (ENDOMECHANICALS) ×3 IMPLANT
SOLUTION ANTI FOG 6CC (MISCELLANEOUS) ×3 IMPLANT
STRIP CLOSURE SKIN 1/2X4 (GAUZE/BANDAGES/DRESSINGS) IMPLANT
SUT MNCRL AB 4-0 PS2 18 (SUTURE) ×3 IMPLANT
SUT VICRYL 0 UR6 27IN ABS (SUTURE) ×2 IMPLANT
TOWEL OR 17X26 10 PK STRL BLUE (TOWEL DISPOSABLE) ×3 IMPLANT
TOWEL OR NON WOVEN STRL DISP B (DISPOSABLE) ×3 IMPLANT
TRAY LAP CHOLE (CUSTOM PROCEDURE TRAY) ×3 IMPLANT
TROCAR BLADELESS OPT 5 100 (ENDOMECHANICALS) ×3 IMPLANT
TROCAR XCEL BLUNT TIP 100MML (ENDOMECHANICALS) ×3 IMPLANT
TROCAR XCEL NON-BLD 11X100MML (ENDOMECHANICALS) ×3 IMPLANT
TUBING INSUFFLATION 10FT LAP (TUBING) ×3 IMPLANT

## 2014-03-17 NOTE — Op Note (Signed)
Patient Name:           Mercedes [name]   Date of Surgery:        03/17/2014  Pre op Diagnosis:      Chronic cholecystitis with cholelithiasis  Post op Diagnosis:    Same  Procedure:                 Laparoscopic cholecystectomy with cholangiogram  Surgeon:                     Edsel Petrin. Dalbert Batman, M.D., FACS  Assistant:                      Coralie Keens, M.D.  Operative Indications:   The patient is a 68 year old female who presents for evaluation of gall stones. She was referred by Dr. Elsie Stain for evaluation of symptomatic gallstones. The patient states that gallstones were noted on imaging studies in the past but she has not been symptomatic. For the past several months, however she has developed some substernal burning discomfort like heartburn but has not had any nausea or vomiting. Zantac helped for a while. More recently she's had several bouts of substernal epigastric and chest pain radiating to the back and the right shoulder blade. This will also be postprandial and she thinks she has fatty food intolerance as it triggers this. Bowel movements have not changed although she states that she had one clay-colored stool. Otherwise normal. Denies nausea vomiting. Denies dysphagia. .Prior lab works showed intermittent borderline elevation of transaminases. Recent ultrasound shows one gallstone at least, 1.9 cm, fatty liver. PH notable for hypertension, gout, hypothyroidism, and mild obesity. Mother had a gangrenous gallbladder but lived to age 50. She is brought to the operating room electively.   Operative Findings:       The gallbladder was chronically inflamed. Lots of adhesions. Thin-walled. There was a very large benign-appearing hyperplastic lymph node to the right of the cystic duct which had to be carefully dissected away. The cholangiogram was basically normal, showing normal intrahepatic and extrahepatic biliary about anatomy, no filling defect, and no obstruction was good flow of  contrast into the duodenum. The stomach, duodenum, large intestine, and small intestine were otherwise normal to inspection.  Procedure in Detail:          Following the induction of general endotracheal anesthesia the patient's abdomen was prepped and draped in a sterile fashion. Intravenous antibiotics were given. Surgical time out was performed. 0.5% Marcaine with epinephrine was used as local infiltration anesthetic.    A vertical incision was made at the lower rim of the umbilicus, the fascia was incised, and the abdomen was entered under direct vision. An 11 millimeter Hassan trocar was inserted and secured with a pursestring suture of 0 Vicryl. Pneumoperitoneum was created and videocamera was inserted. 11 mm trocar was placed in the subxiphoid region Two 5 mm trochars were placed in the right upper quadrant. The gallbladder fundus was identified and elevated. I slowly dissected the chronic adhesions off the gallbladder and infundibulum. I slowly dissected a large lymph node away from the right side of the gallbladder neck. I isolated the cystic duct and cystic artery. A cholangiogram catheter was inserted into the cystic duct and a cholangiogram was obtained using the C-arm. The cholangiogram was normal as described above. The cholangiocatheter was removed, the cystic duct secured with multiple metal clips and divided. The cystic artery was then further isolated, secured  with metal clips and divided. The gallbladder was dissected from its bed electrocautery, placed in a specimen bag and removed.    The operative field was irrigated extensively. Irrigation fluid was completely clear. There was no evidence of bleeding or bile leak. The trocars were removed and there was no bleeding from the trocar sites. The pneumoperitoneum was released. The fascia at the umbilicus was closed with 0 Vicryl sutures. The skin incisions were closed with subcuticular sutures of 4-0 Monocryl and Dermabond. The patient tolerated  the procedure well was taken to PACU in stable condition. EBL 15 cc. Counts correct. Complications none.         Edsel Petrin. Dalbert Batman, M.D., FACS General and Minimally Invasive Surgery Breast and Colorectal Surgery  03/17/2014 1:12 PM

## 2014-03-17 NOTE — Transfer of Care (Signed)
Immediate Anesthesia Transfer of Care Note  Patient: Mercedes [name]  Procedure(s) Performed: Procedure(s) (LRB): LAPAROSCOPIC CHOLECYSTECTOMY WITH INTRAOPERATIVE CHOLANGIOGRAM POSSIBLE OPEN (N/A)  Patient Location: PACU  Anesthesia Type: General  Level of Consciousness: sedated, patient cooperative and responds to stimulation  Airway & Oxygen Therapy: Patient Spontanous Breathing and Patient connected to face mask oxgen  Post-op Assessment: Report given to PACU RN and Post -op Vital signs reviewed and stable  Post vital signs: Reviewed and stable  Complications: No apparent anesthesia complications

## 2014-03-17 NOTE — Anesthesia Postprocedure Evaluation (Signed)
  Anesthesia Post-op Note  Patient: Mercedes [name]  Procedure(s) Performed: Procedure(s) (LRB): LAPAROSCOPIC CHOLECYSTECTOMY WITH INTRAOPERATIVE CHOLANGIOGRAM POSSIBLE OPEN (N/A)  Patient Location: PACU  Anesthesia Type: General  Level of Consciousness: awake and alert   Airway and Oxygen Therapy: Patient Spontanous Breathing  Post-op Pain: mild  Post-op Assessment: Post-op Vital signs reviewed, Patient's Cardiovascular Status Stable, Respiratory Function Stable, Patent Airway and No signs of Nausea or vomiting  Last Vitals:  Filed Vitals:   03/17/14 1402  BP: 158/73  Pulse: 95  Temp: 36.7 C  Resp: 16    Post-op Vital Signs: stable   Complications: No apparent anesthesia complications

## 2014-03-17 NOTE — Discharge Instructions (Signed)
[facility] ______[facility] °LAPAROSCOPIC SURGERY: POST OP INSTRUCTIONS °Always review your discharge instruction sheet given to you by the facility where your surgery was performed. °IF YOU HAVE DISABILITY OR FAMILY LEAVE FORMS, YOU MUST BRING THEM TO THE OFFICE FOR PROCESSING.   °DO NOT GIVE THEM TO YOUR DOCTOR. ° °1. A prescription for pain medication may be given to you upon discharge.  Take your pain medication as prescribed, if needed.  If narcotic pain medicine is not needed, then you may take acetaminophen (Tylenol) or ibuprofen (Advil) as needed. °2. Take your usually prescribed medications unless otherwise directed. °3. If you need a refill on your pain medication, please contact your pharmacy.  They will contact our office to request authorization. Prescriptions will not be filled after 5pm or on week-ends. °4. You should follow a light diet the first few days after arrival home, such as soup and crackers, etc.  Be sure to include lots of fluids daily. °5. Most patients will experience some swelling and bruising in the area of the incisions.  Ice packs will help.  Swelling and bruising can take several days to resolve.  °6. It is common to experience some constipation if taking pain medication after surgery.  Increasing fluid intake and taking a stool softener (such as Colace) will usually help or prevent this problem from occurring.  A mild laxative (Milk of Magnesia or Miralax) should be taken according to package instructions if there are no bowel movements after 48 hours. °7. Unless discharge instructions indicate otherwise, you may remove your bandages 24-48 hours after surgery, and you may shower at that time.  You may have steri-strips (small skin tapes) in place directly over the incision.  These strips should be left on the skin for 7-10 days.  If your surgeon used skin glue on the incision, you may shower in 24 hours.  The glue will flake off over the next 2-3 weeks.  Any sutures or  staples will be removed at the office during your follow-up visit. °8. ACTIVITIES:  You may resume regular (light) daily activities beginning the next day--such as daily self-care, walking, climbing stairs--gradually increasing activities as tolerated.  You may have sexual intercourse when it is comfortable.  Refrain from any heavy lifting or straining until approved by your doctor. °a. You may drive when you are no longer taking prescription pain medication, you can comfortably wear a seatbelt, and you can safely maneuver your car and apply brakes. °b. RETURN TO WORK:  __________________________________________________________ °9. You should see your doctor in the office for a follow-up appointment approximately 2-3 weeks after your surgery.  Make sure that you call for this appointment within a day or two after you arrive home to insure a convenient appointment time. °10. OTHER INSTRUCTIONS: __________________________________________________________________________________________________________________________ __________________________________________________________________________________________________________________________ °WHEN TO CALL YOUR DOCTOR: °1. Fever over 101.0 °2. Inability to urinate °3. Continued bleeding from incision. °4. Increased pain, redness, or drainage from the incision. °5. Increasing abdominal pain ° °The clinic staff is available to answer your questions during regular business hours.  Please don’t hesitate to call and ask to speak to one of the nurses for clinical concerns.  If you have a medical emergency, go to the nearest emergency room or call 911.  A surgeon from [facility] is always on call at the hospital. °[address], [address], [place], [place]  [address] ? [address] [address], [place], [place]   [address] °[phone] ? [phone] ? [phone] [phone] °Web site:   [email or web address] °

## 2014-03-17 NOTE — Anesthesia Preprocedure Evaluation (Signed)
Anesthesia Evaluation  Patient identified by MRN, date of birth, ID band Patient awake    Reviewed: Allergy & Precautions, H&P , NPO status , Patient's Chart, lab work & pertinent test results  Airway Mallampati: II TM Distance: >3 FB Neck ROM: Full    Dental no notable dental hx.    Pulmonary neg pulmonary ROS, former smoker,  breath sounds clear to auscultation  Pulmonary exam normal       Cardiovascular hypertension, Pt. on medications and Pt. on home beta blockers Rhythm:Regular Rate:Normal     Neuro/Psych negative neurological ROS  negative psych ROS   GI/Hepatic negative GI ROS, Neg liver ROS,   Endo/Other  Hypothyroidism   Renal/GU negative Renal ROS  negative genitourinary   Musculoskeletal negative musculoskeletal ROS (+)   Abdominal   Peds negative pediatric ROS (+)  Hematology negative hematology ROS (+)   Anesthesia Other Findings   Reproductive/Obstetrics negative OB ROS                           Anesthesia Physical Anesthesia Plan  ASA: III  Anesthesia Plan: General   Post-op Pain Management:    Induction: Intravenous  Airway Management Planned: Oral ETT  Additional Equipment:   Intra-op Plan:   Post-operative Plan: Extubation in OR  Informed Consent: I have reviewed the patients History and Physical, chart, labs and discussed the procedure including the risks, benefits and alternatives for the proposed anesthesia with the patient or authorized representative who has indicated his/her understanding and acceptance.   Dental advisory given  Plan Discussed with: CRNA and Surgeon  Anesthesia Plan Comments:         Anesthesia Quick Evaluation

## 2014-03-17 NOTE — Interval H&P Note (Signed)
History and Physical Interval Note:  03/17/2014 11:58 AM  Mercedes [name]  has presented today for surgery, with the diagnosis of gallstones  The goals and the various methods of treatment have been discussed with the patient and family. After consideration of risks, benefits and other options for treatment, the patient has consented to  Procedure(s): LAPAROSCOPIC CHOLECYSTECTOMY WITH INTRAOPERATIVE CHOLANGIOGRAM POSSIBLE OPEN (N/A) as a surgical intervention .  The patient's history has been reviewed, patient examined, no change in status, stable for surgery.  I have reviewed the patient's chart and labs.  Questions were answered to the patient's satisfaction.     Adin Hector

## 2014-03-17 NOTE — Progress Notes (Signed)
Pt ambulated in hall, tolerated well.  Pt voided moderate amount clear yellow urine.

## 2014-03-18 ENCOUNTER — Encounter (HOSPITAL_COMMUNITY): Payer: Self-pay | Admitting: General Surgery

## 2014-04-07 ENCOUNTER — Ambulatory Visit (INDEPENDENT_AMBULATORY_CARE_PROVIDER_SITE_OTHER): Payer: Medicare Other

## 2014-04-07 DIAGNOSIS — Z23 Encounter for immunization: Secondary | ICD-10-CM

## 2014-04-12 ENCOUNTER — Encounter: Payer: Self-pay | Admitting: Family Medicine

## 2014-04-13 ENCOUNTER — Encounter: Payer: Self-pay | Admitting: *Deleted

## 2014-06-15 ENCOUNTER — Other Ambulatory Visit: Payer: Self-pay | Admitting: Family Medicine

## 2014-06-15 NOTE — Telephone Encounter (Signed)
Please verify from patient how often she is taking colchicine.  Thanks.

## 2014-06-15 NOTE — Telephone Encounter (Signed)
Electronic refill request.  Please advise. 

## 2014-06-16 NOTE — Telephone Encounter (Signed)
Spoke to patient and was advised that she takes this about 12 times a year only for a few days at a time. Patient stated that she last took Colchicine about 2 months ago.

## 2014-06-17 NOTE — Telephone Encounter (Signed)
Then I sent it for 60 pills, not 180.  She shouldn't need 180 at a time.  I put refills on it.  Thanks.

## 2014-07-22 ENCOUNTER — Telehealth: Payer: Self-pay | Admitting: *Deleted

## 2014-07-22 NOTE — Telephone Encounter (Signed)
Pt has new insurance and is requesting that all of her Rxs be sent to her new pharmacy, [address] pt needs all of her Rxs that Dr. Damita Dunnings sent there because her new insurance will not cover it at her old pharmacy

## 2014-07-24 MED ORDER — FLUTICASONE PROPIONATE 50 MCG/ACT NA SUSP: 2.0000 | NASAL | Status: DC | PRN

## 2014-07-24 MED ORDER — LEVOTHYROXINE SODIUM 88 MCG PO TABS: 88.0000 ug | ORAL_TABLET | Freq: Every day | ORAL | Status: DC

## 2014-07-24 MED ORDER — PRAVASTATIN SODIUM 40 MG PO TABS: 40.0000 mg | ORAL_TABLET | Freq: Every day | ORAL | Status: DC

## 2014-07-24 MED ORDER — LEVOTHYROXINE SODIUM 125 MCG PO TABS: 125.0000 ug | ORAL_TABLET | Freq: Every day | ORAL | Status: DC

## 2014-07-24 MED ORDER — COLCHICINE 0.6 MG PO TABS: ORAL_TABLET | ORAL | Status: DC

## 2014-07-24 MED ORDER — METOPROLOL SUCCINATE ER 200 MG PO TB24
200.0000 mg | ORAL_TABLET | Freq: Every morning | ORAL | Status: DC
Start: 2014-07-24 — End: 2015-08-21

## 2014-07-24 MED ORDER — RANITIDINE HCL 150 MG PO TABS: 150.0000 mg | ORAL_TABLET | Freq: Every day | ORAL | Status: DC | PRN

## 2014-07-24 NOTE — Telephone Encounter (Signed)
Sent. Thanks.   

## 2014-08-20 DIAGNOSIS — H2513 Age-related nuclear cataract, bilateral: Secondary | ICD-10-CM | POA: Diagnosis not present

## 2014-09-28 DIAGNOSIS — H2513 Age-related nuclear cataract, bilateral: Secondary | ICD-10-CM | POA: Diagnosis not present

## 2014-09-30 DIAGNOSIS — L578 Other skin changes due to chronic exposure to nonionizing radiation: Secondary | ICD-10-CM | POA: Diagnosis not present

## 2014-09-30 DIAGNOSIS — D179 Benign lipomatous neoplasm, unspecified: Secondary | ICD-10-CM | POA: Diagnosis not present

## 2014-09-30 DIAGNOSIS — D229 Melanocytic nevi, unspecified: Secondary | ICD-10-CM | POA: Diagnosis not present

## 2014-09-30 DIAGNOSIS — Z1283 Encounter for screening for malignant neoplasm of skin: Secondary | ICD-10-CM | POA: Diagnosis not present

## 2014-09-30 DIAGNOSIS — D1800 Hemangioma unspecified site: Secondary | ICD-10-CM | POA: Diagnosis not present

## 2014-09-30 DIAGNOSIS — L720 Epidermal cyst: Secondary | ICD-10-CM | POA: Diagnosis not present

## 2014-09-30 DIAGNOSIS — L821 Other seborrheic keratosis: Secondary | ICD-10-CM | POA: Diagnosis not present

## 2014-09-30 DIAGNOSIS — D2339 Other benign neoplasm of skin of other parts of face: Secondary | ICD-10-CM | POA: Diagnosis not present

## 2014-10-06 ENCOUNTER — Ambulatory Visit
Admit: 2014-10-06 | Disposition: A | Discharge status: Home / Self Care | Payer: Self-pay | Attending: Ophthalmology | Admitting: Ophthalmology

## 2014-10-06 DIAGNOSIS — Z880 Allergy status to penicillin: Secondary | ICD-10-CM | POA: Diagnosis not present

## 2014-10-06 DIAGNOSIS — M109 Gout, unspecified: Secondary | ICD-10-CM | POA: Diagnosis not present

## 2014-10-06 DIAGNOSIS — Z87891 Personal history of nicotine dependence: Secondary | ICD-10-CM | POA: Diagnosis not present

## 2014-10-06 DIAGNOSIS — K219 Gastro-esophageal reflux disease without esophagitis: Secondary | ICD-10-CM | POA: Diagnosis not present

## 2014-10-06 DIAGNOSIS — Z882 Allergy status to sulfonamides status: Secondary | ICD-10-CM | POA: Diagnosis not present

## 2014-10-06 DIAGNOSIS — H2513 Age-related nuclear cataract, bilateral: Secondary | ICD-10-CM | POA: Diagnosis not present

## 2014-10-06 DIAGNOSIS — Z9049 Acquired absence of other specified parts of digestive tract: Secondary | ICD-10-CM | POA: Diagnosis not present

## 2014-10-06 DIAGNOSIS — Z79899 Other long term (current) drug therapy: Secondary | ICD-10-CM | POA: Diagnosis not present

## 2014-10-06 DIAGNOSIS — E780 Pure hypercholesterolemia: Secondary | ICD-10-CM | POA: Diagnosis not present

## 2014-10-06 DIAGNOSIS — M4690 Unspecified inflammatory spondylopathy, site unspecified: Secondary | ICD-10-CM | POA: Diagnosis not present

## 2014-10-06 DIAGNOSIS — Z881 Allergy status to other antibiotic agents status: Secondary | ICD-10-CM | POA: Diagnosis not present

## 2014-10-06 DIAGNOSIS — H2511 Age-related nuclear cataract, right eye: Secondary | ICD-10-CM | POA: Diagnosis not present

## 2014-10-06 DIAGNOSIS — M419 Scoliosis, unspecified: Secondary | ICD-10-CM | POA: Diagnosis not present

## 2014-10-22 DIAGNOSIS — H2512 Age-related nuclear cataract, left eye: Secondary | ICD-10-CM | POA: Diagnosis not present

## 2014-10-27 ENCOUNTER — Ambulatory Visit
Admit: 2014-10-27 | Disposition: A | Discharge status: Home / Self Care | Payer: Self-pay | Attending: Ophthalmology | Admitting: Ophthalmology

## 2014-10-27 DIAGNOSIS — M47897 Other spondylosis, lumbosacral region: Secondary | ICD-10-CM | POA: Diagnosis not present

## 2014-10-27 DIAGNOSIS — K219 Gastro-esophageal reflux disease without esophagitis: Secondary | ICD-10-CM | POA: Diagnosis not present

## 2014-10-27 DIAGNOSIS — M419 Scoliosis, unspecified: Secondary | ICD-10-CM | POA: Diagnosis not present

## 2014-10-27 DIAGNOSIS — Z882 Allergy status to sulfonamides status: Secondary | ICD-10-CM | POA: Diagnosis not present

## 2014-10-27 DIAGNOSIS — Z79899 Other long term (current) drug therapy: Secondary | ICD-10-CM | POA: Diagnosis not present

## 2014-10-27 DIAGNOSIS — E780 Pure hypercholesterolemia: Secondary | ICD-10-CM | POA: Diagnosis not present

## 2014-10-27 DIAGNOSIS — Z881 Allergy status to other antibiotic agents status: Secondary | ICD-10-CM | POA: Diagnosis not present

## 2014-10-27 DIAGNOSIS — M109 Gout, unspecified: Secondary | ICD-10-CM | POA: Diagnosis not present

## 2014-10-27 DIAGNOSIS — I10 Essential (primary) hypertension: Secondary | ICD-10-CM | POA: Diagnosis not present

## 2014-10-27 DIAGNOSIS — H2512 Age-related nuclear cataract, left eye: Secondary | ICD-10-CM | POA: Diagnosis not present

## 2014-10-27 DIAGNOSIS — Z87891 Personal history of nicotine dependence: Secondary | ICD-10-CM | POA: Diagnosis not present

## 2014-10-27 DIAGNOSIS — Z9049 Acquired absence of other specified parts of digestive tract: Secondary | ICD-10-CM | POA: Diagnosis not present

## 2014-10-27 DIAGNOSIS — M19079 Primary osteoarthritis, unspecified ankle and foot: Secondary | ICD-10-CM | POA: Diagnosis not present

## 2014-10-27 DIAGNOSIS — Z880 Allergy status to penicillin: Secondary | ICD-10-CM | POA: Diagnosis not present

## 2014-12-02 ENCOUNTER — Other Ambulatory Visit: Payer: Self-pay | Admitting: Family Medicine

## 2014-12-02 ENCOUNTER — Other Ambulatory Visit (INDEPENDENT_AMBULATORY_CARE_PROVIDER_SITE_OTHER): Payer: Medicare Other

## 2014-12-02 DIAGNOSIS — M109 Gout, unspecified: Secondary | ICD-10-CM

## 2014-12-02 DIAGNOSIS — E038 Other specified hypothyroidism: Secondary | ICD-10-CM

## 2014-12-02 DIAGNOSIS — E785 Hyperlipidemia, unspecified: Secondary | ICD-10-CM

## 2014-12-02 LAB — COMPREHENSIVE METABOLIC PANEL
ALBUMIN: 4.1 g/dL (ref 3.5–5.2)
ALT: 26 U/L (ref 0–35)
AST: 23 U/L (ref 0–37)
Alkaline Phosphatase: 72 U/L (ref 39–117)
BUN: 19 mg/dL (ref 6–23)
CO2: 31 mEq/L (ref 19–32)
CREATININE: 0.84 mg/dL (ref 0.40–1.20)
Calcium: 10.4 mg/dL (ref 8.4–10.5)
Chloride: 103 mEq/L (ref 96–112)
GFR: 71 mL/min (ref >60.00)
GLUCOSE: 131 mg/dL — AB (ref 70–99)
Potassium: 5 mEq/L (ref 3.5–5.1)
Sodium: 139 mEq/L (ref 135–145)
TOTAL PROTEIN: 7.5 g/dL (ref 6.0–8.3)
Total Bilirubin: 1 mg/dL (ref 0.2–1.2)

## 2014-12-02 LAB — VITAMIN D 25 HYDROXY (VIT D DEFICIENCY, FRACTURES): VITD: 25.84 ng/mL — ABNORMAL LOW (ref 30.00–100.00)

## 2014-12-02 LAB — LIPID PANEL
CHOL/HDL RATIO: 4
Cholesterol: 185 mg/dL (ref 0–200)
HDL: 43.3 mg/dL (ref >39.00)
LDL Cholesterol: 108 mg/dL — ABNORMAL HIGH (ref 0–99)
NonHDL: 141.7
Triglycerides: 171 mg/dL — ABNORMAL HIGH (ref 0.0–149.0)
VLDL: 34.2 mg/dL (ref 0.0–40.0)

## 2014-12-02 LAB — URIC ACID: Uric Acid, Serum: 7.8 mg/dL — ABNORMAL HIGH (ref 2.4–7.0)

## 2014-12-02 LAB — TSH: TSH: 0.27 u[IU]/mL — AB (ref 0.35–4.50)

## 2014-12-13 ENCOUNTER — Ambulatory Visit (INDEPENDENT_AMBULATORY_CARE_PROVIDER_SITE_OTHER): Payer: Medicare Other | Admitting: Family Medicine

## 2014-12-13 ENCOUNTER — Encounter: Payer: Self-pay | Admitting: Family Medicine

## 2014-12-13 VITALS — BP 130/70 | HR 73 | Temp 98.4°F | Ht 64.0 in | Wt 212.5 lb

## 2014-12-13 DIAGNOSIS — I10 Essential (primary) hypertension: Secondary | ICD-10-CM | POA: Diagnosis not present

## 2014-12-13 DIAGNOSIS — Z7189 Other specified counseling: Secondary | ICD-10-CM

## 2014-12-13 DIAGNOSIS — M109 Gout, unspecified: Secondary | ICD-10-CM

## 2014-12-13 DIAGNOSIS — E559 Vitamin D deficiency, unspecified: Secondary | ICD-10-CM | POA: Diagnosis not present

## 2014-12-13 DIAGNOSIS — E039 Hypothyroidism, unspecified: Secondary | ICD-10-CM | POA: Diagnosis not present

## 2014-12-13 DIAGNOSIS — Z23 Encounter for immunization: Secondary | ICD-10-CM

## 2014-12-13 DIAGNOSIS — R739 Hyperglycemia, unspecified: Secondary | ICD-10-CM

## 2014-12-13 DIAGNOSIS — E348 Other specified endocrine disorders: Secondary | ICD-10-CM | POA: Diagnosis not present

## 2014-12-13 DIAGNOSIS — Z0000 Encounter for general adult medical examination without abnormal findings: Secondary | ICD-10-CM

## 2014-12-13 DIAGNOSIS — M8580 Other specified disorders of bone density and structure, unspecified site: Secondary | ICD-10-CM

## 2014-12-13 NOTE — Progress Notes (Signed)
Pre visit review using our clinic review tool, if applicable. No additional management support is needed unless otherwise documented below in the visit note.  I have personally reviewed the Medicare Annual Wellness questionnaire and have noted 1. The patient's medical and social history 2. Their use of alcohol, tobacco or illicit drugs 3. Their current medications and supplements 4. The patient's functional ability including ADL's, fall risks, home safety risks and hearing or visual             impairment. 5. Diet and physical activities 6. Evidence for depression or mood disorders  The patients weight, height, BMI have been recorded in the chart and visual acuity is per eye clinic.  I have made referrals, counseling and provided education to the patient based review of the above and I have provided the pt with a written personalized care plan for preventive services.  Provider list updated- see scanned forms.  Routine anticipatory guidance given to patient.  See health maintenance.  Flu prev done Shingles  2012 PNA 2012 Tetanus 2010 Colonoscopy 2015 Breast cancer screening done 2015 DXA pending.  Advance directive- husband designated if patient were incapacitated.   Cognitive function addressed- see scanned forms- and if abnormal then additional documentation follows.   Hypothyroid.  Slightly low TSH.  No neck mass.  Complaint. No ADE on meds.    Sugar elevation d/w pt.  Possible DM2 dx d/w pt.  Needs work on diet, gave low carb diet plan.  D/w pt.  She understands need for weight reduction, diet/exercise.  Labs d/w pt, ie sugar.   Vit D low. Prev with elevated calcium but okay now.  See plan.   PMH and SH reviewed  Meds, vitals, and allergies reviewed.   ROS: See HPI.  Otherwise negative.    GEN: nad, alert and oriented HEENT: mucous membranes moist NECK: supple w/o LA CV: rrr. PULM: ctab, no inc wob ABD: soft, +bs EXT: no edema SKIN: no acute rash

## 2014-12-13 NOTE — Patient Instructions (Signed)
Low carb diet with exercise.   No med changes.  Recheck labs in about 3 months, not fasting.   Take care.  Glad to see you.

## 2014-12-14 DIAGNOSIS — E559 Vitamin D deficiency, unspecified: Secondary | ICD-10-CM | POA: Insufficient documentation

## 2014-12-14 DIAGNOSIS — Z7189 Other specified counseling: Secondary | ICD-10-CM | POA: Insufficient documentation

## 2014-12-14 NOTE — Assessment & Plan Note (Signed)
Controlled, continue as is.  

## 2014-12-14 NOTE — Assessment & Plan Note (Addendum)
Worse, with minimal dec in TSH; would continue as is and recheck TSH in about 3 months.  She agrees.

## 2014-12-14 NOTE — Assessment & Plan Note (Addendum)
Worse.  Needs weight reduction, low carb diet, recheck A1c in about 3 months.  She agrees.

## 2014-12-14 NOTE — Assessment & Plan Note (Signed)
Flu prev done Shingles  2012 PNA 2012 Tetanus 2010 Colonoscopy 2015 Breast cancer screening done 2015 DXA pending.  Advance directive- husband designated if patient were incapacitated.   Cognitive function addressed- see scanned forms- and if abnormal then additional documentation follows.

## 2014-12-14 NOTE — Assessment & Plan Note (Signed)
No recent flares, still needs weight reduction.  Uric acid above goal, but wouldn't intervene o/w w/o flares.  She agrees.

## 2014-12-14 NOTE — Assessment & Plan Note (Signed)
Restart replacement, recheck in about 3 months.

## 2014-12-16 DIAGNOSIS — M8580 Other specified disorders of bone density and structure, unspecified site: Secondary | ICD-10-CM | POA: Diagnosis not present

## 2014-12-23 ENCOUNTER — Encounter: Payer: Self-pay | Admitting: *Deleted

## 2014-12-23 ENCOUNTER — Encounter: Payer: Self-pay | Admitting: Family Medicine

## 2015-03-15 ENCOUNTER — Other Ambulatory Visit: Payer: Self-pay | Admitting: Family Medicine

## 2015-03-15 ENCOUNTER — Other Ambulatory Visit (INDEPENDENT_AMBULATORY_CARE_PROVIDER_SITE_OTHER): Payer: Medicare Other

## 2015-03-15 DIAGNOSIS — R739 Hyperglycemia, unspecified: Secondary | ICD-10-CM | POA: Diagnosis not present

## 2015-03-15 DIAGNOSIS — E039 Hypothyroidism, unspecified: Secondary | ICD-10-CM

## 2015-03-15 DIAGNOSIS — E559 Vitamin D deficiency, unspecified: Secondary | ICD-10-CM

## 2015-03-15 DIAGNOSIS — E038 Other specified hypothyroidism: Secondary | ICD-10-CM

## 2015-03-15 LAB — TSH: TSH: 0.1 u[IU]/mL — ABNORMAL LOW (ref 0.35–4.50)

## 2015-03-15 LAB — HEMOGLOBIN A1C: HEMOGLOBIN A1C: 5.5 % (ref 4.6–6.5)

## 2015-03-15 LAB — VITAMIN D 25 HYDROXY (VIT D DEFICIENCY, FRACTURES): VITD: 37.58 ng/mL (ref 30.00–100.00)

## 2015-03-15 MED ORDER — LEVOTHYROXINE SODIUM 200 MCG PO TABS: 200.0000 ug | ORAL_TABLET | Freq: Every day | ORAL | Status: DC

## 2015-03-31 ENCOUNTER — Ambulatory Visit (INDEPENDENT_AMBULATORY_CARE_PROVIDER_SITE_OTHER): Payer: Medicare Other

## 2015-03-31 DIAGNOSIS — Z23 Encounter for immunization: Secondary | ICD-10-CM

## 2015-04-11 DIAGNOSIS — Z1231 Encounter for screening mammogram for malignant neoplasm of breast: Secondary | ICD-10-CM | POA: Diagnosis not present

## 2015-04-12 ENCOUNTER — Encounter: Payer: Self-pay | Admitting: Family Medicine

## 2015-04-13 ENCOUNTER — Encounter: Payer: Self-pay | Admitting: *Deleted

## 2015-05-23 ENCOUNTER — Other Ambulatory Visit (INDEPENDENT_AMBULATORY_CARE_PROVIDER_SITE_OTHER): Payer: Medicare Other

## 2015-05-23 DIAGNOSIS — E038 Other specified hypothyroidism: Secondary | ICD-10-CM | POA: Diagnosis not present

## 2015-05-23 LAB — TSH: TSH: 0.83 u[IU]/mL (ref 0.35–4.50)

## 2015-06-13 DIAGNOSIS — H353132 Nonexudative age-related macular degeneration, bilateral, intermediate dry stage: Secondary | ICD-10-CM | POA: Diagnosis not present

## 2015-08-21 ENCOUNTER — Other Ambulatory Visit: Payer: Self-pay | Admitting: Family Medicine

## 2015-10-06 DIAGNOSIS — D1800 Hemangioma unspecified site: Secondary | ICD-10-CM | POA: Diagnosis not present

## 2015-10-06 DIAGNOSIS — D229 Melanocytic nevi, unspecified: Secondary | ICD-10-CM | POA: Diagnosis not present

## 2015-10-06 DIAGNOSIS — L918 Other hypertrophic disorders of the skin: Secondary | ICD-10-CM | POA: Diagnosis not present

## 2015-10-06 DIAGNOSIS — L739 Follicular disorder, unspecified: Secondary | ICD-10-CM | POA: Diagnosis not present

## 2015-10-06 DIAGNOSIS — D1721 Benign lipomatous neoplasm of skin and subcutaneous tissue of right arm: Secondary | ICD-10-CM | POA: Diagnosis not present

## 2015-10-06 DIAGNOSIS — Z1283 Encounter for screening for malignant neoplasm of skin: Secondary | ICD-10-CM | POA: Diagnosis not present

## 2015-10-06 DIAGNOSIS — L821 Other seborrheic keratosis: Secondary | ICD-10-CM | POA: Diagnosis not present

## 2015-10-06 DIAGNOSIS — L720 Epidermal cyst: Secondary | ICD-10-CM | POA: Diagnosis not present

## 2015-12-15 ENCOUNTER — Other Ambulatory Visit: Payer: Self-pay | Admitting: Family Medicine

## 2015-12-15 DIAGNOSIS — E559 Vitamin D deficiency, unspecified: Secondary | ICD-10-CM

## 2015-12-15 DIAGNOSIS — I10 Essential (primary) hypertension: Secondary | ICD-10-CM

## 2015-12-15 DIAGNOSIS — M109 Gout, unspecified: Secondary | ICD-10-CM

## 2015-12-15 DIAGNOSIS — R739 Hyperglycemia, unspecified: Secondary | ICD-10-CM

## 2015-12-23 ENCOUNTER — Other Ambulatory Visit: Payer: Self-pay

## 2015-12-23 ENCOUNTER — Other Ambulatory Visit (INDEPENDENT_AMBULATORY_CARE_PROVIDER_SITE_OTHER): Payer: PPO

## 2015-12-23 ENCOUNTER — Ambulatory Visit (INDEPENDENT_AMBULATORY_CARE_PROVIDER_SITE_OTHER): Payer: PPO

## 2015-12-23 VITALS — BP 140/76 | HR 65 | Temp 98.5°F | Ht 65.0 in | Wt 222.0 lb

## 2015-12-23 DIAGNOSIS — R739 Hyperglycemia, unspecified: Secondary | ICD-10-CM

## 2015-12-23 DIAGNOSIS — E559 Vitamin D deficiency, unspecified: Secondary | ICD-10-CM | POA: Diagnosis not present

## 2015-12-23 DIAGNOSIS — M109 Gout, unspecified: Secondary | ICD-10-CM

## 2015-12-23 DIAGNOSIS — Z1159 Encounter for screening for other viral diseases: Secondary | ICD-10-CM | POA: Diagnosis not present

## 2015-12-23 DIAGNOSIS — Z0000 Encounter for general adult medical examination without abnormal findings: Secondary | ICD-10-CM

## 2015-12-23 DIAGNOSIS — I10 Essential (primary) hypertension: Secondary | ICD-10-CM | POA: Diagnosis not present

## 2015-12-23 LAB — COMPREHENSIVE METABOLIC PANEL
ALBUMIN: 4 g/dL (ref 3.5–5.2)
ALK PHOS: 54 U/L (ref 39–117)
ALT: 29 U/L (ref 0–35)
AST: 23 U/L (ref 0–37)
BILIRUBIN TOTAL: 1.1 mg/dL (ref 0.2–1.2)
BUN: 19 mg/dL (ref 6–23)
CALCIUM: 9.6 mg/dL (ref 8.4–10.5)
CO2: 29 mEq/L (ref 19–32)
Chloride: 106 mEq/L (ref 96–112)
Creatinine, Ser: 0.83 mg/dL (ref 0.40–1.20)
GFR: 71.77 mL/min (ref >60.00)
GLUCOSE: 107 mg/dL — AB (ref 70–99)
POTASSIUM: 4.2 meq/L (ref 3.5–5.1)
SODIUM: 141 meq/L (ref 135–145)
Total Protein: 7.1 g/dL (ref 6.0–8.3)

## 2015-12-23 LAB — LIPID PANEL
CHOLESTEROL: 183 mg/dL (ref 0–200)
HDL: 43 mg/dL (ref >39.00)
NonHDL: 140.27
Total CHOL/HDL Ratio: 4
Triglycerides: 205 mg/dL — ABNORMAL HIGH (ref 0.0–149.0)
VLDL: 41 mg/dL — AB (ref 0.0–40.0)

## 2015-12-23 LAB — HEMOGLOBIN A1C: HEMOGLOBIN A1C: 5.7 % (ref 4.6–6.5)

## 2015-12-23 LAB — TSH: TSH: 0.76 u[IU]/mL (ref 0.35–4.50)

## 2015-12-23 LAB — URIC ACID: Uric Acid, Serum: 8.7 mg/dL — ABNORMAL HIGH (ref 2.4–7.0)

## 2015-12-23 LAB — LDL CHOLESTEROL, DIRECT: LDL DIRECT: 113 mg/dL

## 2015-12-23 LAB — VITAMIN D 25 HYDROXY (VIT D DEFICIENCY, FRACTURES): VITD: 33.11 ng/mL (ref 30.00–100.00)

## 2015-12-23 MED ORDER — COLCHICINE 0.6 MG PO TABS: ORAL_TABLET | ORAL | Status: DC

## 2015-12-23 NOTE — Addendum Note (Signed)
Addended by: Royann Shivers A on: 12/23/2015 09:55 AM   Modules accepted: Orders

## 2015-12-23 NOTE — Telephone Encounter (Signed)
Pt was in today for AWV and requested a refill for Colchicine.

## 2015-12-23 NOTE — Progress Notes (Signed)
Subjective:   Mercedes [name] is a 70 y.o. female who presents for Medicare Annual (Subsequent) preventive examination.  Review of Systems:  N/A Cardiac Risk Factors include: advanced age (>37men, >62 women);hypertension;obesity (BMI >30kg/m2)     Objective:     Vitals: BP 140/76 mmHg  Pulse 65  Temp(Src) 98.5 F (36.9 C) (Oral)  Ht 5\' 5"  (1.651 m)  Wt 222 lb (100.699 kg)  BMI 36.94 kg/m2  SpO2 96%  Body mass index is 36.94 kg/(m^2).   Tobacco History  Smoking status  . Former Smoker -- 0.25 packs/day for 15 years  . Types: Cigarettes  . Quit date: 07/04/2010  Smokeless tobacco  . Never Used    Comment: 5 pyh current smoker, 1/2 PPD, started ~18 YOA, quit 10 years, restarted ~1 year ago     Counseling given: No   Past Medical History  Diagnosis Date  . Diverticulosis of colon   . Gout   . HLD (hyperlipidemia)     under control  . HTN (hypertension)   . Hypothyroidism   . Osteoarthritis   . Tobacco abuse   . Fatty liver   . Allergy   . GERD (gastroesophageal reflux disease)   . Rapid heartbeat     occasional, does not last long  . 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 Cataract     bilateral-not an issue right now   Past Surgical History  Procedure Laterality Date  . Sigmoidoscopy    . Colonoscopy      removed polyps  . Cholecystectomy N/A 03/17/2014    Procedure: LAPAROSCOPIC CHOLECYSTECTOMY WITH INTRAOPERATIVE CHOLANGIOGRAM ;  Surgeon: Fanny Skates, MD;  Location: [facility];  Service: General;  Laterality: N/A;  . Cataract extraction     Family History  Problem Relation Age of Onset  . Ulcers Father   . Angina Mother   . Cancer Brother   . Hypertension Brother   . Prostate cancer Brother   . Colon cancer Neg Hx   . Breast cancer Neg Hx   . Esophageal cancer Neg Hx   . Stomach cancer Neg Hx   . Rectal cancer Neg Hx    History  Sexual Activity  . Sexual Activity: No    Outpatient Encounter Prescriptions as of 12/23/2015  Medication Sig  .  cholecalciferol (VITAMIN D) 1000 units tablet Take 1,000 Units by mouth daily.  . colchicine 0.6 MG tablet Take 1 tablet (0.6 mg  total) by mouth 2 (two)  times daily as needed.  . fluticasone (FLONASE) 50 MCG/ACT nasal spray Place 2 sprays into both nostrils as needed.  Marland Kitchen levothyroxine (SYNTHROID, LEVOTHROID) 200 MCG tablet Take 1 tablet (200 mcg total) by mouth daily before breakfast.  . metoprolol (TOPROL-XL) 200 MG 24 hr tablet TAKE ONE TABLET BY MOUTH IN THE MORNING  . Multiple Vitamin (MULTIVITAMIN WITH MINERALS) TABS tablet Take 1 tablet by mouth daily.  . Multiple Vitamins-Minerals (PRESERVISION AREDS 2) CAPS Take 1 capsule by mouth 2 (two) times daily.  . Omega 3 1000 MG CAPS Take 2,000 mg by mouth daily.  . pravastatin (PRAVACHOL) 40 MG tablet TAKE ONE TABLET BY MOUTH ONCE DAILY  . psyllium (REGULOID) 0.52 G capsule Take 0.52 g by mouth daily. Two tablets per day  . TURMERIC PO Take 1 capsule by mouth.   No facility-administered encounter medications on file as of 12/23/2015.    Activities of Daily Living In your present state of health, do you have any difficulty performing the  following activities: 12/23/2015  Hearing? N  Vision? N  Difficulty concentrating or making decisions? N  Walking or climbing stairs? N  Dressing or bathing? N  Doing errands, shopping? N  Preparing Food and eating ? N  Using the Toilet? N  In the past six months, have you accidently leaked urine? Y  Do you have problems with loss of bowel control? N  Managing your Medications? N  Managing your Finances? N  Housekeeping or managing your Housekeeping? N    Patient Care Team: Tonia Ghent, MD as PCP - General (Family Medicine) Leandrew Koyanagi, MD as Referring Physician (Ophthalmology) Ralene Bathe, MD as Referring Physician (Dermatology)    Assessment:     Hearing Screening   125Hz  250Hz  500Hz  1000Hz  2000Hz  4000Hz  8000Hz   Right ear:   40 40 40 0   Left ear:   40 40 40 0   Vision  Screening Comments: Last eye exam in December 2016 with Dr. Wallace Going   Exercise Activities and Dietary recommendations Current Exercise Habits: The patient does not participate in regular exercise at present, Exercise limited by: None identified  Goals    . Weight < 200 lb (90.719 kg)     Target weight loss is 50 lbs. Starting 12/23/2015, I will continue to drink protein shakes for 2 meals and minimize intake of carbs during dinner. Daily intake of carbs does not exceed 20 grams.        Fall Risk Fall Risk  12/23/2015 12/13/2014 12/01/2013  Falls in the past year? No No No   Depression Screen PHQ 2/9 Scores 12/23/2015 12/13/2014 12/01/2013  PHQ - 2 Score 0 0 0     Cognitive Testing MMSE - Mini Mental State Exam 12/23/2015  Orientation to time 5  Orientation to Place 5  Registration 3  Attention/ Calculation 0  Recall 3  Language- name 2 objects 0  Language- repeat 1  Language- follow 3 step command 3  Language- read & follow direction 0  Write a sentence 0  Copy design 0  Total score 20   PLEASE NOTE: A Mini-Cog screen was completed. Maximum score is 20. A value of 0 denotes this part of Folstein MMSE was not completed or the patient failed this part of the Mini-Cog screening.   Mini-Cog Screening Orientation to Time - Max 5 pts Orientation to Place - Max 5 pts Registration - Max 3 pts Recall - Max 3 pts Language Repeat - Max 1 pts Language Follow 3 Step Command - Max 3 pts  Immunization History  Administered Date(s) Administered  . H1N1 07/08/2008  . Influenza Whole 05/23/2004  . Influenza,inj,Quad PF,36+ Mos 04/07/2014, 03/31/2015  . Pneumococcal Conjugate-13 12/13/2014  . Pneumococcal Polysaccharide-23 08/09/2010  . Td 07/02/1998, 07/08/2008  . Zoster 10/30/2010   Screening Tests Health Maintenance  Topic Date Due  . INFLUENZA VACCINE  01/31/2016  . COLONOSCOPY  12/30/2016  . MAMMOGRAM  04/10/2017  . TETANUS/TDAP  07/08/2018  . DEXA SCAN  Completed  .  ZOSTAVAX  Completed  . Hepatitis C Screening  Completed  . PNA vac Low Risk Adult  Completed      Plan:     I have personally reviewed and addressed the Medicare Annual Wellness questionnaire and have noted the following in the patient's chart:  A. Medical and social history B. Use of alcohol, tobacco or illicit drugs  C. Current medications and supplements D. Functional ability and status E.  Nutritional status F.  Physical activity G. Advance  directives H. List of other physicians I.  Hospitalizations, surgeries, and ER visits in previous 12 months J.  [place] to include hearing, vision, cognitive, depression L. Referrals and appointments - none  In addition, I have reviewed and discussed with patient certain preventive protocols, quality metrics, and best practice recommendations. A written personalized care plan for preventive services as well as general preventive health recommendations were provided to patient.  See attached scanned questionnaire for additional information.   Signed,   Lindell Noe, MHA, BS, LPN Health Advisor

## 2015-12-23 NOTE — Progress Notes (Signed)
PCP notes  Health maintenance:  Hep C screening - completed  Abnormal screenings:  Hearing - failed  Patient concerns:   Pt reports increased pain level in lower back when lifting or stretching forward. Pain scale: 4/10. Onset: Several years ago.   Nurse concerns: None  Next PCP appt: 12/29/2015 @ 1415

## 2015-12-23 NOTE — Progress Notes (Signed)
I reviewed health advisor's note, was available for consultation, and agree with documentation and plan.  

## 2015-12-23 NOTE — Patient Instructions (Signed)
Ms. Flikkema , Thank you for taking time to come for your Medicare Wellness Visit. I appreciate your ongoing commitment to your health goals. Please review the following plan we discussed and let me know if I can assist you in the future.   These are the goals we discussed: Goals    . Weight < 200 lb (90.719 kg)     Target weight loss is 50 lbs. Starting 12/23/2015, I will continue to drink protein shakes for 2 meals and minimize intake of carbs during dinner. Daily intake of carbs does not exceed 20 grams.         This is a list of the screening recommended for you and due dates:  Health Maintenance  Topic Date Due  . Flu Shot  01/31/2016  . Colon Cancer Screening  12/30/2016  . Mammogram  04/10/2017  . Tetanus Vaccine  07/08/2018  . DEXA scan (bone density measurement)  Completed  . Shingles Vaccine  Completed  .  Hepatitis C: One time screening is recommended by Center for Disease Control  (CDC) for  adults born from [birth year] through 1967.   Completed  . Pneumonia vaccines  Completed    Preventive Care for Adults  A healthy lifestyle and preventive care can promote health and wellness. Preventive health guidelines for adults include the following key practices.  . A routine yearly physical is a good way to check with your health care provider about your health and preventive screening. It is a chance to share any concerns and updates on your health and to receive a thorough exam.  . Visit your dentist for a routine exam and preventive care every 6 months. Brush your teeth twice a day and floss once a day. Good oral hygiene prevents tooth decay and gum disease.  . The frequency of eye exams is based on your age, health, family medical history, use  of contact lenses, and other factors. Follow your health care provider's ecommendations for frequency of eye exams.  . Eat a healthy diet. Foods like vegetables, fruits, whole grains, low-fat dairy products, and lean protein foods contain  the nutrients you need without too many calories. Decrease your intake of foods high in solid fats, added sugars, and salt. Eat the right amount of calories for you. Get information about a proper diet from your health care provider, if necessary.  . Regular physical exercise is one of the most important things you can do for your health. Most adults should get at least 150 minutes of moderate-intensity exercise (any activity that increases your heart rate and causes you to sweat) each week. In addition, most adults need muscle-strengthening exercises on 2 or more days a week.  Silver Sneakers may be a benefit available to you. To determine eligibility, you may visit the website: www.silversneakers.com or contact program at 551-395-8112 Mon-Fri between 8AM-8PM.   . Maintain a healthy weight. The body mass index (BMI) is a screening tool to identify possible weight problems. It provides an estimate of body fat based on height and weight. Your health care provider can find your BMI and can help you achieve or maintain a healthy weight.   For adults 20 years and older: ? A BMI below 18.5 is considered underweight. ? A BMI of 18.5 to 24.9 is normal. ? A BMI of 25 to 29.9 is considered overweight. ? A BMI of 30 and above is considered obese.   . Maintain normal blood lipids and cholesterol levels by exercising and minimizing your  intake of saturated fat. Eat a balanced diet with plenty of fruit and vegetables. Blood tests for lipids and cholesterol should begin at age 38 and be repeated every 5 years. If your lipid or cholesterol levels are high, you are over 50, or you are at high risk for heart disease, you may need your cholesterol levels checked more frequently. Ongoing high lipid and cholesterol levels should be treated with medicines if diet and exercise are not working.  . If you smoke, find out from your health care provider how to quit. If you do not use tobacco, please do not start.  . If  you choose to drink alcohol, please do not consume more than 2 drinks per day. One drink is considered to be 12 ounces (355 mL) of beer, 5 ounces (148 mL) of wine, or 1.5 ounces (44 mL) of liquor.  . If you are 3-58 years old, ask your health care provider if you should take aspirin to prevent strokes.  . Use sunscreen. Apply sunscreen liberally and repeatedly throughout the day. You should seek shade when your shadow is shorter than you. Protect yourself by wearing long sleeves, pants, a wide-brimmed hat, and sunglasses year round, whenever you are outdoors.  . Once a month, do a whole body skin exam, using a mirror to look at the skin on your back. Tell your health care provider of new moles, moles that have irregular borders, moles that are larger than a pencil eraser, or moles that have changed in shape or color.

## 2015-12-23 NOTE — Progress Notes (Signed)
Pre visit review using our clinic review tool, if applicable. No additional management support is needed unless otherwise documented below in the visit note. 

## 2015-12-24 LAB — HEPATITIS C ANTIBODY: HCV Ab: NEGATIVE

## 2015-12-26 DIAGNOSIS — H353132 Nonexudative age-related macular degeneration, bilateral, intermediate dry stage: Secondary | ICD-10-CM | POA: Diagnosis not present

## 2015-12-29 ENCOUNTER — Encounter: Payer: Self-pay | Admitting: Family Medicine

## 2015-12-29 ENCOUNTER — Ambulatory Visit (INDEPENDENT_AMBULATORY_CARE_PROVIDER_SITE_OTHER)
Admission: RE | Admit: 2015-12-29 | Discharge: 2015-12-29 | Disposition: A | Discharge status: Home / Self Care | Payer: PPO | Source: Ambulatory Visit | Attending: Family Medicine | Admitting: Family Medicine

## 2015-12-29 ENCOUNTER — Ambulatory Visit (INDEPENDENT_AMBULATORY_CARE_PROVIDER_SITE_OTHER): Payer: PPO | Admitting: Family Medicine

## 2015-12-29 VITALS — BP 142/78 | HR 79 | Temp 98.4°F | Ht 65.0 in | Wt 220.0 lb

## 2015-12-29 DIAGNOSIS — M549 Dorsalgia, unspecified: Secondary | ICD-10-CM | POA: Insufficient documentation

## 2015-12-29 DIAGNOSIS — M545 Low back pain, unspecified: Secondary | ICD-10-CM

## 2015-12-29 DIAGNOSIS — E785 Hyperlipidemia, unspecified: Secondary | ICD-10-CM | POA: Diagnosis not present

## 2015-12-29 DIAGNOSIS — E038 Other specified hypothyroidism: Secondary | ICD-10-CM

## 2015-12-29 DIAGNOSIS — I10 Essential (primary) hypertension: Secondary | ICD-10-CM

## 2015-12-29 DIAGNOSIS — M109 Gout, unspecified: Secondary | ICD-10-CM | POA: Diagnosis not present

## 2015-12-29 DIAGNOSIS — M5032 Other cervical disc degeneration, mid-cervical region, unspecified level: Secondary | ICD-10-CM | POA: Diagnosis not present

## 2015-12-29 MED ORDER — METOPROLOL SUCCINATE ER 200 MG PO TB24: 200.0000 mg | ORAL_TABLET | Freq: Every morning | ORAL | Status: DC

## 2015-12-29 MED ORDER — PRAVASTATIN SODIUM 40 MG PO TABS: 40.0000 mg | ORAL_TABLET | Freq: Every day | ORAL | Status: DC

## 2015-12-29 MED ORDER — LEVOTHYROXINE SODIUM 200 MCG PO TABS: 200.0000 ug | ORAL_TABLET | Freq: Every day | ORAL | Status: DC

## 2015-12-29 NOTE — Patient Instructions (Signed)
Go to the lab on the way out.  We'll contact you with your xray report. Take care.  Glad to see you.  Update me as needed.  We'll check on PT after I see your xrays.

## 2015-12-29 NOTE — Assessment & Plan Note (Signed)
Continue current med, continue work on diet and exercise, labs d/w pt.

## 2015-12-29 NOTE — Progress Notes (Signed)
Pre visit review using our clinic review tool, if applicable. No additional management support is needed unless otherwise documented below in the visit note.  Elevated Cholesterol: Using medications without problems:yes Muscle aches: not from statin Diet compliance:encouraged Exercise:encouraged.    Back pain. Lower back pain.  More pain leaning over and down (making a bed, doing dishes).  No pain sitting or walking but noted as soon as she is reaching out and down.  Better with setting down.  No leg pain.  No weakness.  No B/B sx.  B lower back pain, not midline pain.  Getting worse over the last few years.  advil and aleve didn't help.    Hearing loss.  Declined hearing aids.  D/w pt.  She'll update me as needed.    Hypothyroidism.  No lumps in neck, no dysphagia, no ADE on med.  Complaint.    HCV neg, d/w pt.    Gout.  No recent flares.  No recent colchicine use.  She improved when she started taking metamucil.  Labs d/w pt.    Htn.   Using medication without problems or lightheadedness: yes Chest pain with exertion:no Edema:no Short of breath:no H/o tachycardia.  She occ notes a few seconds of tachycardia at the moment she is waking up but not otherwise, and only once every few months.   Still on BB.    Meds, vitals, and allergies reviewed.   PMH and SH reviewed  ROS: Per HPI unless specifically indicated in ROS section   GEN: nad, alert and oriented HEENT: mucous membranes moist NECK: supple w/o LA CV: rrr. PULM: ctab, no inc wob ABD: soft, +bs EXT: no edema SKIN: no acute rash Back not ttp in midline, no bruising, no rash.   S/S grossly wnl BLE

## 2015-12-29 NOTE — Assessment & Plan Note (Signed)
Rare flares, continues as is.

## 2015-12-29 NOTE — Assessment & Plan Note (Signed)
See notes on plain films.  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  Would be okay to proceed with PT.  Orders.  If not better after PT then she can let me know.  Concern for spinal stenosis with the sx better on sitting.   Okay for outaptient f/u, no emergent sx. >25 minutes spent in face to face time with patient, >50% spent in counselling or coordination of care.

## 2015-12-29 NOTE — Assessment & Plan Note (Signed)
Controlled, continue as is. TSH wnl

## 2015-12-29 NOTE — Assessment & Plan Note (Signed)
Reasonable control, esp given her pain level, continue as is.  If more tachycardia, then she'll notify me.  Rare sx now.  No exertional sx.

## 2016-01-09 DIAGNOSIS — M545 Low back pain: Secondary | ICD-10-CM | POA: Diagnosis not present

## 2016-01-11 DIAGNOSIS — M545 Low back pain: Secondary | ICD-10-CM | POA: Diagnosis not present

## 2016-01-16 DIAGNOSIS — M545 Low back pain: Secondary | ICD-10-CM | POA: Diagnosis not present

## 2016-01-23 DIAGNOSIS — M545 Low back pain: Secondary | ICD-10-CM | POA: Diagnosis not present

## 2016-01-25 DIAGNOSIS — M545 Low back pain: Secondary | ICD-10-CM | POA: Diagnosis not present

## 2016-01-30 DIAGNOSIS — M545 Low back pain: Secondary | ICD-10-CM | POA: Diagnosis not present

## 2016-02-01 DIAGNOSIS — M545 Low back pain: Secondary | ICD-10-CM | POA: Diagnosis not present

## 2016-04-11 ENCOUNTER — Encounter: Payer: Self-pay | Admitting: Family Medicine

## 2016-04-11 DIAGNOSIS — Z1231 Encounter for screening mammogram for malignant neoplasm of breast: Secondary | ICD-10-CM | POA: Diagnosis not present

## 2016-04-12 ENCOUNTER — Encounter: Payer: Self-pay | Admitting: Family Medicine

## 2016-04-12 DIAGNOSIS — N6323 Unspecified lump in the left breast, lower outer quadrant: Secondary | ICD-10-CM | POA: Diagnosis not present

## 2016-04-12 DIAGNOSIS — N6012 Diffuse cystic mastopathy of left breast: Secondary | ICD-10-CM | POA: Diagnosis not present

## 2016-04-18 ENCOUNTER — Ambulatory Visit (INDEPENDENT_AMBULATORY_CARE_PROVIDER_SITE_OTHER): Payer: PPO

## 2016-04-18 DIAGNOSIS — Z23 Encounter for immunization: Secondary | ICD-10-CM

## 2016-07-03 DIAGNOSIS — H353132 Nonexudative age-related macular degeneration, bilateral, intermediate dry stage: Secondary | ICD-10-CM | POA: Diagnosis not present

## 2016-10-29 DIAGNOSIS — L918 Other hypertrophic disorders of the skin: Secondary | ICD-10-CM | POA: Diagnosis not present

## 2016-10-29 DIAGNOSIS — D229 Melanocytic nevi, unspecified: Secondary | ICD-10-CM | POA: Diagnosis not present

## 2016-10-29 DIAGNOSIS — L821 Other seborrheic keratosis: Secondary | ICD-10-CM | POA: Diagnosis not present

## 2016-10-29 DIAGNOSIS — Z1283 Encounter for screening for malignant neoplasm of skin: Secondary | ICD-10-CM | POA: Diagnosis not present

## 2016-10-29 DIAGNOSIS — D179 Benign lipomatous neoplasm, unspecified: Secondary | ICD-10-CM | POA: Diagnosis not present

## 2016-10-29 DIAGNOSIS — L720 Epidermal cyst: Secondary | ICD-10-CM | POA: Diagnosis not present

## 2016-10-29 DIAGNOSIS — L812 Freckles: Secondary | ICD-10-CM | POA: Diagnosis not present

## 2016-10-29 DIAGNOSIS — D1800 Hemangioma unspecified site: Secondary | ICD-10-CM | POA: Diagnosis not present

## 2016-10-29 DIAGNOSIS — L578 Other skin changes due to chronic exposure to nonionizing radiation: Secondary | ICD-10-CM | POA: Diagnosis not present

## 2016-11-23 ENCOUNTER — Encounter: Payer: Self-pay | Admitting: Gastroenterology

## 2016-12-16 ENCOUNTER — Other Ambulatory Visit: Payer: Self-pay | Admitting: Family Medicine

## 2016-12-16 DIAGNOSIS — M109 Gout, unspecified: Secondary | ICD-10-CM

## 2016-12-16 DIAGNOSIS — E559 Vitamin D deficiency, unspecified: Secondary | ICD-10-CM

## 2016-12-16 DIAGNOSIS — I10 Essential (primary) hypertension: Secondary | ICD-10-CM

## 2016-12-16 DIAGNOSIS — R739 Hyperglycemia, unspecified: Secondary | ICD-10-CM

## 2016-12-24 ENCOUNTER — Other Ambulatory Visit (INDEPENDENT_AMBULATORY_CARE_PROVIDER_SITE_OTHER): Payer: PPO

## 2016-12-24 DIAGNOSIS — M109 Gout, unspecified: Secondary | ICD-10-CM

## 2016-12-24 DIAGNOSIS — R7989 Other specified abnormal findings of blood chemistry: Secondary | ICD-10-CM

## 2016-12-24 DIAGNOSIS — I10 Essential (primary) hypertension: Secondary | ICD-10-CM

## 2016-12-24 DIAGNOSIS — R739 Hyperglycemia, unspecified: Secondary | ICD-10-CM

## 2016-12-24 DIAGNOSIS — E559 Vitamin D deficiency, unspecified: Secondary | ICD-10-CM | POA: Diagnosis not present

## 2016-12-24 LAB — COMPREHENSIVE METABOLIC PANEL
ALT: 19 U/L (ref 0–35)
AST: 18 U/L (ref 0–37)
Albumin: 4.1 g/dL (ref 3.5–5.2)
Alkaline Phosphatase: 60 U/L (ref 39–117)
BUN: 20 mg/dL (ref 6–23)
CHLORIDE: 105 meq/L (ref 96–112)
CO2: 29 meq/L (ref 19–32)
CREATININE: 0.83 mg/dL (ref 0.40–1.20)
Calcium: 10.1 mg/dL (ref 8.4–10.5)
GFR: 71.57 mL/min (ref >60.00)
Glucose, Bld: 128 mg/dL — ABNORMAL HIGH (ref 70–99)
Potassium: 4.4 mEq/L (ref 3.5–5.1)
SODIUM: 140 meq/L (ref 135–145)
Total Bilirubin: 1.1 mg/dL (ref 0.2–1.2)
Total Protein: 6.9 g/dL (ref 6.0–8.3)

## 2016-12-24 LAB — URIC ACID: Uric Acid, Serum: 7.4 mg/dL — ABNORMAL HIGH (ref 2.4–7.0)

## 2016-12-24 LAB — LIPID PANEL
CHOL/HDL RATIO: 4
Cholesterol: 169 mg/dL (ref 0–200)
HDL: 42.6 mg/dL (ref >39.00)
NONHDL: 126.54
TRIGLYCERIDES: 227 mg/dL — AB (ref 0.0–149.0)
VLDL: 45.4 mg/dL — ABNORMAL HIGH (ref 0.0–40.0)

## 2016-12-24 LAB — HEMOGLOBIN A1C: Hgb A1c MFr Bld: 5.7 % (ref 4.6–6.5)

## 2016-12-24 LAB — VITAMIN D 25 HYDROXY (VIT D DEFICIENCY, FRACTURES): VITD: 32.96 ng/mL (ref 30.00–100.00)

## 2016-12-24 LAB — TSH: TSH: 0.41 u[IU]/mL (ref 0.35–4.50)

## 2016-12-24 LAB — LDL CHOLESTEROL, DIRECT: LDL DIRECT: 100 mg/dL

## 2016-12-27 ENCOUNTER — Encounter: Payer: Self-pay | Admitting: Family Medicine

## 2016-12-27 ENCOUNTER — Ambulatory Visit (INDEPENDENT_AMBULATORY_CARE_PROVIDER_SITE_OTHER): Payer: PPO | Admitting: Family Medicine

## 2016-12-27 VITALS — BP 130/78 | HR 74 | Temp 98.5°F | Ht 65.0 in | Wt 209.5 lb

## 2016-12-27 DIAGNOSIS — E785 Hyperlipidemia, unspecified: Secondary | ICD-10-CM | POA: Diagnosis not present

## 2016-12-27 DIAGNOSIS — M109 Gout, unspecified: Secondary | ICD-10-CM | POA: Diagnosis not present

## 2016-12-27 DIAGNOSIS — Z0000 Encounter for general adult medical examination without abnormal findings: Secondary | ICD-10-CM

## 2016-12-27 DIAGNOSIS — E038 Other specified hypothyroidism: Secondary | ICD-10-CM | POA: Diagnosis not present

## 2016-12-27 DIAGNOSIS — I10 Essential (primary) hypertension: Secondary | ICD-10-CM | POA: Diagnosis not present

## 2016-12-27 MED ORDER — PRAVASTATIN SODIUM 40 MG PO TABS: 40.0000 mg | ORAL_TABLET | Freq: Every day | ORAL | 3 refills | Status: DC

## 2016-12-27 MED ORDER — LEVOTHYROXINE SODIUM 200 MCG PO TABS
200.0000 ug | ORAL_TABLET | Freq: Every day | ORAL | 3 refills | Status: DC
Start: 2016-12-27 — End: 2017-11-27

## 2016-12-27 MED ORDER — METOPROLOL SUCCINATE ER 200 MG PO TB24: 200.0000 mg | ORAL_TABLET | Freq: Every morning | ORAL | 3 refills | Status: DC

## 2016-12-27 NOTE — Assessment & Plan Note (Signed)
Rare flares. Not needing colchicine frequently. Continue as is. Uric acid minimally above goal. Discussed with patient.

## 2016-12-27 NOTE — Patient Instructions (Addendum)
Check with your insurance to see if they will cover the shingles shot (shingrix). Call GI about an appointment.  If you have to take a dose of colchicine, then skip a dose of pravastatin.   Take care.  Glad to see you.  Update me as needed.

## 2016-12-27 NOTE — Assessment & Plan Note (Signed)
Flu prev done Shingles  2012 PNA up to date Tetanus 2010 Colonoscopy 2015, d/w pt.  She'll call about f/u.  Breast cancer screening up to date, d/w pt.  DXA can be pushed back to 2019, d/w pt about rationale, she agrees.   Advance directive- husband designated if patient were incapacitated.   Cognitive function addressed- see scanned forms- and if abnormal then additional documentation follows.

## 2016-12-27 NOTE — Assessment & Plan Note (Signed)
Continue statin.  Continue work on diet and exercise.  Labs discussed with patient.  She agrees. 

## 2016-12-27 NOTE — Assessment & Plan Note (Signed)
No thyromegaly on exam. Continue as is. TSH normal. Discussed with patient.

## 2016-12-27 NOTE — Assessment & Plan Note (Signed)
Reasonable control. Continue metoprolol. Continue work on diet and exercise. Labs discussed with patient.

## 2016-12-27 NOTE — Progress Notes (Signed)
I have personally reviewed the Medicare Annual Wellness questionnaire and have noted 1. The patient's medical and social history 2. Their use of alcohol, tobacco or illicit drugs 3. Their current medications and supplements 4. The patient's functional ability including ADL's, fall risks, home safety risks and hearing or visual             impairment. 5. Diet and physical activities 6. Evidence for depression or mood disorders  The patients weight, height, BMI have been recorded in the chart and visual acuity is per eye clinic.  I have made referrals, counseling and provided education to the patient based review of the above and I have provided the pt with a written personalized care plan for preventive services.  Provider list updated- see scanned forms.  Routine anticipatory guidance given to patient.  See health maintenance. The possibility exists that previously documented standard health maintenance information may have been brought forward from a previous encounter into this note.  If needed, that same information has been updated to reflect the current situation based on today's encounter.    Flu prev done Shingles  2012 PNA up to date Tetanus 2010 Colonoscopy 2015, d/w pt.  She'll call about f/u.  Breast cancer screening up to date, d/w pt.  DXA can be pushed back to 2019, d/w pt about rationale, she agrees.   Advance directive- husband designated if patient were incapacitated.   Cognitive function addressed- see scanned forms- and if abnormal then additional documentation follows.   Gout hx noted, no flares recently. No recent colchicine use.   Hypothyroidism.  No lumps in neck, no dysphagia.  Compliant.  TSH wnl, d/w pt.   Hypertension:    Using medication without problems or lightheadedness: yes Chest pain with exertion: no Edema:no Short of breath:no Average home BPs:no Other issues: going to the gym 5x/week.    Elevated Cholesterol: Using medications without  problems:yes Muscle aches: no Diet compliance:yes Exercise:yes Labs d/w pt.   PMH and SH reviewed  Meds, vitals, and allergies reviewed.   ROS: Per HPI.  Unless specifically indicated otherwise in HPI, the patient denies:  General: fever. Eyes: acute vision changes ENT: sore throat Cardiovascular: chest pain Respiratory: SOB GI: vomiting GU: dysuria Musculoskeletal: acute back pain Derm: acute rash Neuro: acute motor dysfunction Psych: worsening mood Endocrine: polydipsia Heme: bleeding Allergy: hayfever  GEN: nad, alert and oriented HEENT: mucous membranes moist NECK: supple w/o LA CV: rrr. PULM: ctab, no inc wob ABD: soft, +bs EXT: no edema SKIN: no acute rash

## 2016-12-28 ENCOUNTER — Encounter: Payer: Self-pay | Admitting: Gastroenterology

## 2017-01-01 DIAGNOSIS — H353132 Nonexudative age-related macular degeneration, bilateral, intermediate dry stage: Secondary | ICD-10-CM | POA: Diagnosis not present

## 2017-02-27 ENCOUNTER — Ambulatory Visit (AMBULATORY_SURGERY_CENTER): Payer: Self-pay | Admitting: *Deleted

## 2017-02-27 VITALS — Ht 64.0 in | Wt 208.0 lb

## 2017-02-27 DIAGNOSIS — Z86010 Personal history of colonic polyps: Secondary | ICD-10-CM

## 2017-02-27 MED ORDER — NA SULFATE-K SULFATE-MG SULF 17.5-3.13-1.6 GM/177ML PO SOLN: ORAL | 0 refills | Status: DC

## 2017-02-27 NOTE — Progress Notes (Signed)
Pt's states no medical or surgical changes since  office visit. 

## 2017-02-27 NOTE — Progress Notes (Signed)
Patient denies any allergies to eggs or soy. Patient denies any problems with anesthesia/sedation. Patient denies any oxygen use at home and does not take any diet/weight loss medications. EMMI education assisgned to patient on colonoscopy, this was explained and instructions given to patient. Suprep coupon given to pt.

## 2017-02-28 ENCOUNTER — Encounter: Payer: Self-pay | Admitting: Gastroenterology

## 2017-03-13 ENCOUNTER — Encounter: Payer: Self-pay | Admitting: Gastroenterology

## 2017-03-13 ENCOUNTER — Ambulatory Visit (AMBULATORY_SURGERY_CENTER): Payer: PPO | Admitting: Gastroenterology

## 2017-03-13 VITALS — BP 132/68 | HR 61 | Temp 98.6°F | Resp 20 | Ht 65.0 in | Wt 209.0 lb

## 2017-03-13 DIAGNOSIS — I10 Essential (primary) hypertension: Secondary | ICD-10-CM | POA: Diagnosis not present

## 2017-03-13 DIAGNOSIS — D123 Benign neoplasm of transverse colon: Secondary | ICD-10-CM

## 2017-03-13 DIAGNOSIS — K5730 Diverticulosis of large intestine without perforation or abscess without bleeding: Secondary | ICD-10-CM

## 2017-03-13 DIAGNOSIS — D122 Benign neoplasm of ascending colon: Secondary | ICD-10-CM

## 2017-03-13 DIAGNOSIS — Z86010 Personal history of colonic polyps: Secondary | ICD-10-CM

## 2017-03-13 DIAGNOSIS — K219 Gastro-esophageal reflux disease without esophagitis: Secondary | ICD-10-CM | POA: Diagnosis not present

## 2017-03-13 MED ORDER — SODIUM CHLORIDE 0.9 % IV SOLN: 500.0000 mL | INTRAVENOUS | Status: DC

## 2017-03-13 NOTE — Progress Notes (Signed)
Pt's states no medical or surgical changes since previsit or office visit. 

## 2017-03-13 NOTE — Progress Notes (Signed)
Called to room to assist during endoscopic procedure.  Patient ID and intended procedure confirmed with present staff. Received instructions for my participation in the procedure from the performing physician.  

## 2017-03-13 NOTE — Op Note (Signed)
[facility] Patient Name: Mercedes [name] Procedure Date: 03/13/2017 9:39 AM MRN: [id number] Endoscopist: Milus Banister , MD Age: 71 Referring MD:  Date of Birth: Jun 05, 1946 Gender: Female Account #: [id number] Procedure:                Colonoscopy Indications:              High risk colon cancer surveillance: Personal                            history of colonic polyps; 2015 colonoscopy 6                            adenomas, including one that was 1.2cm Medicines:                Monitored Anesthesia Care Procedure:                Pre-Anesthesia Assessment:                           - Prior to the procedure, a History and Physical                            was performed, and patient medications and                            allergies were reviewed. The patient's tolerance of                            previous anesthesia was also reviewed. The risks                            and benefits of the procedure and the sedation                            options and risks were discussed with the patient.                            All questions were answered, and informed consent                            was obtained. Prior Anticoagulants: The patient has                            taken no previous anticoagulant or antiplatelet                            agents. ASA Grade Assessment: II - A patient with                            mild systemic disease. After reviewing the risks                            and benefits, the patient was deemed in  satisfactory condition to undergo the procedure.                           After obtaining informed consent, the colonoscope                            was passed under direct vision. Throughout the                            procedure, the patient's blood pressure, pulse, and                            oxygen saturations were monitored continuously. The                            Colonoscope was introduced  through the anus and                            advanced to the the cecum, identified by                            appendiceal orifice and ileocecal valve. The                            colonoscopy was performed without difficulty. The                            patient tolerated the procedure well. The quality                            of the bowel preparation was good. The ileocecal                            valve, appendiceal orifice, and rectum were                            photographed. Scope In: 9:50:20 AM Scope Out: 10:02:50 AM Scope Withdrawal Time: 0 hours 10 minutes 0 seconds  Total Procedure Duration: 0 hours 12 minutes 30 seconds  Findings:                 Three sessile polyps were found in the transverse                            colon and ascending colon. The polyps were 3 to 4                            mm in size. These polyps were removed with a cold                            snare. Resection and retrieval were complete.                           Multiple small and large-mouthed diverticula were  found in the left colon.                           The exam was otherwise without abnormality on                            direct and retroflexion views. Complications:            No immediate complications. Estimated blood loss:                            None. Estimated Blood Loss:     Estimated blood loss: none. Impression:               - Three 3 to 4 mm polyps in the transverse colon                            and in the ascending colon, removed with a cold                            snare. Resected and retrieved.                           - Diverticulosis in the left colon.                           - The examination was otherwise normal on direct                            and retroflexion views. Recommendation:           - Patient has a contact number available for                            emergencies. The signs and symptoms of  potential                            delayed complications were discussed with the                            patient. Return to normal activities tomorrow.                            Written discharge instructions were provided to the                            patient.                           - Resume previous diet.                           - Continue present medications.                           You will receive a letter within 2-3 weeks with the  pathology results and my final recommendations.                           If the polyp(s) is proven to be 'pre-cancerous' on                            pathology, you will need repeat colonoscopy in 3-5                            years. Milus Banister, MD 03/13/2017 10:06:05 AM This report has been signed electronically.

## 2017-03-13 NOTE — Progress Notes (Signed)
Report given to PACU, vss 

## 2017-03-13 NOTE — Patient Instructions (Signed)
YOU HAD AN ENDOSCOPIC PROCEDURE TODAY AT THE [facility]:   Refer to the procedure report that was given to you for any specific questions about what was found during the examination.  If the procedure report does not answer your questions, please call your gastroenterologist to clarify.  If you requested that your care partner not be given the details of your procedure findings, then the procedure report has been included in a sealed envelope for you to review at your convenience later.  YOU SHOULD EXPECT: Some feelings of bloating in the abdomen. Passage of more gas than usual.  Walking can help get rid of the air that was put into your GI tract during the procedure and reduce the bloating. If you had a lower endoscopy (such as a colonoscopy or flexible sigmoidoscopy) you may notice spotting of blood in your stool or on the toilet paper. If you underwent a bowel prep for your procedure, you may not have a normal bowel movement for a few days.  Please Note:  You might notice some irritation and congestion in your nose or some drainage.  This is from the oxygen used during your procedure.  There is no need for concern and it should clear up in a day or so.  SYMPTOMS TO REPORT IMMEDIATELY:   Following lower endoscopy (colonoscopy or flexible sigmoidoscopy):  Excessive amounts of blood in the stool  Significant tenderness or worsening of abdominal pains  Swelling of the abdomen that is new, acute  Fever of 100F or higher  For urgent or emergent issues, a gastroenterologist can be reached at any hour by calling [phone].   DIET:  We do recommend a small meal at first, but then you may proceed to your regular diet.  Drink plenty of fluids but you should avoid alcoholic beverages for 24 hours.  ACTIVITY:  You should plan to take it easy for the rest of today and you should NOT DRIVE or use heavy machinery until tomorrow (because of the sedation medicines used during the test).     FOLLOW UP: Our staff will call the number listed on your records the next business day following your procedure to check on you and address any questions or concerns that you may have regarding the information given to you following your procedure. If we do not reach you, we will leave a message.  However, if you are feeling well and you are not experiencing any problems, there is no need to return our call.  We will assume that you have returned to your regular daily activities without incident.  If any biopsies were taken you will be contacted by phone or by letter within the next 1-3 weeks.  Please call us at [phone] if you have not heard about the biopsies in 3 weeks.   Await for biopsy results to determine next repeat Colonoscopy screening Polyps (handout given) Diverticulosis (handout given)   SIGNATURES/CONFIDENTIALITY: You and/or your care partner have signed paperwork which will be entered into your electronic medical record.  These signatures attest to the fact that that the information above on your After Visit Summary has been reviewed and is understood.  Full responsibility of the confidentiality of this discharge information lies with you and/or your care-partner. 

## 2017-03-14 ENCOUNTER — Telehealth: Payer: Self-pay | Admitting: *Deleted

## 2017-03-14 ENCOUNTER — Telehealth: Payer: Self-pay

## 2017-03-14 NOTE — Telephone Encounter (Signed)
  Follow up Call-  Call Tommye Lehenbauer number 03/13/2017  Post procedure Call Kristiana Jacko phone  # [phone]  Permission to leave phone message Yes  Some recent data might be hidden     Patient questions:  Do you have a fever, pain , or abdominal swelling? No. Pain Score  0 *  Have you tolerated food without any problems? Yes.    Have you been able to return to your normal activities? Yes.    Do you have any questions about your discharge instructions: Diet   No. Medications  No. Follow up visit  No.  Do you have questions or concerns about your Care? No.  Actions: * If pain score is 4 or above: No action needed, pain <4.

## 2017-03-14 NOTE — Telephone Encounter (Signed)
  Follow up Call-  Call back number 03/13/2017  Post procedure Call Back phone  # [phone]  Permission to leave phone message Yes  Some recent data might be hidden    No answer at # given.  LM on VM.

## 2017-03-19 ENCOUNTER — Encounter: Payer: Self-pay | Admitting: Gastroenterology

## 2017-03-25 ENCOUNTER — Telehealth: Payer: Self-pay

## 2017-03-25 ENCOUNTER — Telehealth: Payer: Self-pay | Admitting: Gastroenterology

## 2017-03-25 NOTE — Telephone Encounter (Signed)
Patient states she never received a results letter from colon on 9.12.18 and would like a call to review results.

## 2017-03-25 NOTE — Telephone Encounter (Signed)
The pt was given the information, she will call if she has any further concerns or problems prior to the colon recall     [place] [id number]   Dear Ms. Mercedes [name],  Several polyps that I removed during your recent procedure were completely benign but were proven to be "pre-cancerous" polyps that MAY have grown into cancers if they had not been removed.  Studies shows that at least 20% of women over age 73 and 30% of men over age 79 have pre-cancerous polyps.  Based on current nationally recognized surveillance guidelines, I recommend that you have a repeat colonoscopy in 3 years.   If you develop any new rectal bleeding, abdominal pain or significant bowel habit changes, please contact me before then.   Sincerely,  Milus Banister, MD

## 2017-03-25 NOTE — Telephone Encounter (Signed)
Pt thinks go email that colonoscopy results are in my chart but pt cannot find; pt will contact Dr Ardis Hughs office and cb  [facility] if needed.

## 2017-04-04 ENCOUNTER — Ambulatory Visit (INDEPENDENT_AMBULATORY_CARE_PROVIDER_SITE_OTHER): Payer: PPO

## 2017-04-04 DIAGNOSIS — Z23 Encounter for immunization: Secondary | ICD-10-CM | POA: Diagnosis not present

## 2017-04-15 ENCOUNTER — Encounter: Payer: Self-pay | Admitting: Family Medicine

## 2017-04-15 DIAGNOSIS — Z1231 Encounter for screening mammogram for malignant neoplasm of breast: Secondary | ICD-10-CM | POA: Diagnosis not present

## 2017-07-04 DIAGNOSIS — H353132 Nonexudative age-related macular degeneration, bilateral, intermediate dry stage: Secondary | ICD-10-CM | POA: Diagnosis not present

## 2017-07-30 ENCOUNTER — Other Ambulatory Visit: Payer: Self-pay

## 2017-07-30 ENCOUNTER — Emergency Department
Admission: EM | Admit: 2017-07-30 | Discharge: 2017-07-30 | Disposition: A | Discharge status: Home / Self Care | Payer: PPO | Attending: Emergency Medicine | Admitting: Emergency Medicine

## 2017-07-30 ENCOUNTER — Telehealth: Payer: Self-pay

## 2017-07-30 ENCOUNTER — Encounter: Payer: Self-pay | Admitting: Emergency Medicine

## 2017-07-30 DIAGNOSIS — Z2914 Encounter for prophylactic rabies immune globin: Secondary | ICD-10-CM | POA: Insufficient documentation

## 2017-07-30 DIAGNOSIS — Z209 Contact with and (suspected) exposure to unspecified communicable disease: Secondary | ICD-10-CM

## 2017-07-30 DIAGNOSIS — E039 Hypothyroidism, unspecified: Secondary | ICD-10-CM | POA: Insufficient documentation

## 2017-07-30 DIAGNOSIS — Z79899 Other long term (current) drug therapy: Secondary | ICD-10-CM | POA: Diagnosis not present

## 2017-07-30 DIAGNOSIS — Z23 Encounter for immunization: Secondary | ICD-10-CM | POA: Insufficient documentation

## 2017-07-30 DIAGNOSIS — I10 Essential (primary) hypertension: Secondary | ICD-10-CM | POA: Insufficient documentation

## 2017-07-30 DIAGNOSIS — Z203 Contact with and (suspected) exposure to rabies: Secondary | ICD-10-CM | POA: Diagnosis not present

## 2017-07-30 DIAGNOSIS — Z87891 Personal history of nicotine dependence: Secondary | ICD-10-CM | POA: Insufficient documentation

## 2017-07-30 MED ORDER — RABIES VACCINE, PCEC IM SUSR
1.0000 mL | Freq: Once | INTRAMUSCULAR | Status: AC
  Administered 2017-07-30: 1 mL via INTRAMUSCULAR
  Filled 2017-07-30: qty 1

## 2017-07-30 MED ORDER — RABIES IMMUNE GLOBULIN 150 UNIT/ML IM INJ
20.0000 [IU]/kg | INJECTION | Freq: Once | INTRAMUSCULAR | Status: AC
  Administered 2017-07-30: 1950 [IU] via INTRAMUSCULAR
  Filled 2017-07-30: qty 13

## 2017-07-30 MED ORDER — TETANUS-DIPHTH-ACELL PERTUSSIS 5-2.5-18.5 LF-MCG/0.5 IM SUSP
0.5000 mL | Freq: Once | INTRAMUSCULAR | Status: AC
  Administered 2017-07-30: 0.5 mL via INTRAMUSCULAR
  Filled 2017-07-30: qty 0.5

## 2017-07-30 NOTE — ED Triage Notes (Signed)
Sent to ED for rabies series due to possible exposure to a bat in the home.

## 2017-07-30 NOTE — ED Provider Notes (Signed)
[facility] Emergency Department Provider Note  ____________________________________________  Time seen: Approximately 4:04 PM  I have reviewed the triage vital signs and the nursing notes.   HISTORY  Chief Complaint Animal Bite (possible exposure to a bat)    HPI Mercedes [name] is a 72 y.o. female who presents the emergency department after contact with the bat for rabies vaccine.  Patient was standing in the kitchen with her husband, when her husband open up a cabinet and a bat flew out.  Patient reports that the back to make contact with them but does not think that it actually bit either her or her husband.  The bat flew around the house until they were able to get the bat out.  After releasing the back, patient reports that she read online about rabies and realizes that should retain the back for animal control for testing.  At this time, she and her husband are presenting to the emergency department for rabies vaccine prophylactically.  She reports that she does not think she was bitten, however "better safe than sorry."  Patient denies any other complaints at this time.  No medications for this complaint prior to arrival.  Patient has a history of diverticulosis, GERD, headaches, hyperlipidemia, hypertension, hypothyroidism, osteoarthritis.  She denies any complaints with any chronic medical problems.  Past Medical History:  Diagnosis Date  . Allergy   . Cataract    bilateral-not an issue right now  . Diverticulosis of colon   . Fatty liver   . GERD (gastroesophageal reflux disease)   . Gout   . 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 HLD (hyperlipidemia)    under control  . HTN (hypertension)   . Hypothyroidism   . Osteoarthritis   . Rapid heartbeat    occasional, does not last long  . Tobacco abuse     Patient Active Problem List   Diagnosis Date Noted  . Back pain 12/29/2015  . Advance care planning 12/14/2014  . Vitamin D deficiency 12/14/2014   . Hyperglycemia 10/02/2012  . Medicare annual wellness visit, subsequent 10/02/2012  . Leg pain 07/09/2012  . Colon cancer screening 09/02/2011  . Osteoporosis screening 09/02/2011  . Obesity 11/08/2010  . Gout   . HLD (hyperlipidemia)   . Hypothyroidism 01/10/2007  . HYPERTENSION, BENIGN ESSENTIAL 01/10/2007  . DIVERTICULOSIS, COLON 01/10/2007  . MENOPAUSAL SYNDROME 01/10/2007  . OSTEOARTHRITIS 01/10/2007    Past Surgical History:  Procedure Laterality Date  . CATARACT EXTRACTION    . CHOLECYSTECTOMY N/A 03/17/2014   Procedure: LAPAROSCOPIC CHOLECYSTECTOMY WITH INTRAOPERATIVE CHOLANGIOGRAM ;  Surgeon: Fanny Skates, MD;  Location: [facility];  Service: General;  Laterality: N/A;  . COLONOSCOPY     removed polyps  . SIGMOIDOSCOPY      Prior to Admission medications   Medication Sig Start Date End Date Taking? Authorizing Provider  cholecalciferol (VITAMIN D) 1000 units tablet Take 1,000 Units by mouth daily.    [provider]  colchicine 0.6 MG tablet Take 1 tablet (0.6 mg  total) by mouth 2 (two)  times daily as needed. 12/23/15   Pleas Koch, NP  fluticasone (FLONASE) 50 MCG/ACT nasal spray Place 2 sprays into both nostrils as needed. Patient not taking: Reported on 03/13/2017 07/24/14   Tonia Ghent, MD  levothyroxine (SYNTHROID, LEVOTHROID) 200 MCG tablet Take 1 tablet (200 mcg total) by mouth daily before breakfast. 12/27/16   Tonia Ghent, MD  metoprolol (TOPROL-XL) 200 MG 24 hr tablet Take 1  tablet (200 mg total) by mouth every morning. 12/27/16   Tonia Ghent, MD  Multiple Vitamin (MULTIVITAMIN WITH MINERALS) TABS tablet Take 1 tablet by mouth daily.    [provider]  Multiple Vitamins-Minerals (PRESERVISION AREDS 2) CAPS Take 1 capsule by mouth 2 (two) times daily.    [provider]  Omega 3 1000 MG CAPS Take 2,000 mg by mouth daily.    [provider]  pravastatin (PRAVACHOL) 40 MG tablet Take 1 tablet (40 mg total) by  mouth daily. 12/27/16   Tonia Ghent, MD  psyllium (REGULOID) 0.52 G capsule Take 0.52 g by mouth daily. Two tablets per day    [provider]  TURMERIC PO Take 1 capsule by mouth.    [provider]    Allergies Ace inhibitors; Clarithromycin; Fenofibrate; Lipitor [atorvastatin]; Niacin; Penicillins; Sulfasalazine; and Tetracycline hcl  Family History  Problem Relation Age of Onset  . Ulcers Father   . Angina Mother   . Cancer Brother        prostate  . Hypertension Brother   . Prostate cancer Brother   . Colon cancer Neg Hx   . Breast cancer Neg Hx   . Esophageal cancer Neg Hx   . Stomach cancer Neg Hx   . Rectal cancer Neg Hx     Social History Social History   Tobacco Use  . Smoking status: Former Smoker    Packs/day: 0.25    Years: 15.00    Pack years: 3.75    Types: Cigarettes    Last attempt to quit: 07/04/2010    Years since quitting: 7.0  . Smokeless tobacco: Never Used  . Tobacco comment: 5 pyh current smoker, 1/2 PPD, started ~18 YOA, quit 10 years, restarted ~1 year ago  Substance Use Topics  . Alcohol use: Yes    Alcohol/week: 3.0 oz    Types: 5 Glasses of wine per week    Comment: Wine  . Drug use: No     Review of Systems  Constitutional: No fever/chills Eyes: No visual changes.  Cardiovascular: no chest pain. Respiratory: no cough. No SOB. Gastrointestinal: No abdominal pain.  No nausea, no vomiting.  No diarrhea.  No constipation. Musculoskeletal: Negative for musculoskeletal pain. Skin: Negative for rash, abrasions, lacerations, ecchymosis. Neurological: Negative for headaches, focal weakness or numbness. 10-point ROS otherwise negative.  ____________________________________________   PHYSICAL EXAM:  VITAL SIGNS: ED Triage Vitals  Enc Vitals Group     BP 07/30/17 1540 (!) 147/80     Pulse Rate 07/30/17 1540 92     Resp 07/30/17 1540 18     Temp 07/30/17 1540 98.6 F (37 C)     Temp Source 07/30/17 1540 Oral      SpO2 07/30/17 1540 96 %     Weight 07/30/17 1548 216 lb 0.8 oz (98 kg)     Height 07/30/17 1548 5\' 4"  (1.626 m)     Head Circumference --      Peak Flow --      Pain Score --      Pain Loc --      Pain Edu? --      Excl. in [place]? --      Constitutional: Alert and oriented. Well appearing and in no acute distress. Eyes: Conjunctivae are normal. PERRL. EOMI. Head: Atraumatic. Neck: No stridor.    Cardiovascular: Normal rate, regular rhythm. Normal S1 and S2.  Good peripheral circulation. Respiratory: Normal respiratory effort without tachypnea or retractions.  Lungs CTAB. Good air entry to the bases with no decreased or absent breath sounds. Musculoskeletal: Full range of motion to all extremities. No gross deformities appreciated. Neurologic:  Normal speech and language. No gross focal neurologic deficits are appreciated.  Skin:  Skin is warm, dry and intact. No rash noted.  No appreciable skin lesions consistent with bite is appreciated. Psychiatric: Mood and affect are normal. Speech and behavior are normal. Patient exhibits appropriate insight and judgement.   ____________________________________________   LABS (all labs ordered are listed, but only abnormal results are displayed)  Labs Reviewed - No data to display ____________________________________________  EKG   ____________________________________________  RADIOLOGY   No results found.  ____________________________________________    PROCEDURES  Procedure(s) performed:    Procedures    Medications  rabies vaccine (RABAVERT) injection 1 mL (1 mL Intramuscular Given 07/30/17 1644)  rabies immune globulin (HYPERAB/KEDRAB) injection 1,950 Units (1,950 Units Intramuscular Given 07/30/17 1642)  Tdap (BOOSTRIX) injection 0.5 mL (0.5 mLs Intramuscular Given 07/30/17 1643)     ____________________________________________   INITIAL IMPRESSION / ASSESSMENT AND PLAN / ED COURSE  Pertinent labs & imaging  results that were available during my care of the patient were reviewed by me and considered in my medical decision making (see chart for details).  Review of the [place] CSRS was performed in accordance of the [place] prior to dispensing any controlled drugs.     Patient's diagnosis is consistent with encounter with that, need for rabies vaccine prophylactically.  Patient presented to the emergency department with her husband for complaint of encounter with a bat with unknown bite.  At this time, patient reports that it is unlikely that she was bitten, however she is requesting rabies vaccine prophylactically.  Exam was reassuring with no indication of bite.  Patient will be given a tetanus vaccine, rabies vaccine, rabies immunoglobin here in the emergency department.  They are informed that need to follow-up on day 3, 7, 14 for further rabies vaccine series.  No prescriptions prescribed for at home use at this time.  Patient is to follow-up here in 3 days as described above, and follow-up with primary care as needed.. Patient is given ED precautions to return to the ED for any worsening or new symptoms.     ____________________________________________  FINAL CLINICAL IMPRESSION(S) / ED DIAGNOSES  Final diagnoses:  Exposure to bat without known bite  Need for post exposure prophylaxis for rabies      NEW MEDICATIONS STARTED DURING THIS VISIT:  ED Discharge Orders    None          This chart was dictated using voice recognition software/Dragon. Despite best efforts to proofread, errors can occur which can change the meaning. Any change was purely unintentional.    Darletta Moll, PA-C 07/30/17 Severn, Randall An, MD 07/31/17 [phone]

## 2017-07-30 NOTE — Telephone Encounter (Signed)
Patient advised.

## 2017-07-30 NOTE — Telephone Encounter (Signed)
Copied from [place] [id number]. Topic: Appointment Scheduling - Scheduling Inquiry for Clinic >> Jul 30, 2017 12:26 PM Ether Griffins B wrote: Reason for CRM: pt and her husband are both patients of Dr. Damita Dunnings and have a bat in their home. Animal control stated they needed to call there provider and get vaccinations since they are unsure how long the bat has been in the home. She would like Dr. Damita Dunnings or his nurse to call and please advise them on what they need to do.

## 2017-07-30 NOTE — ED Notes (Signed)
See triage note  Presents with husband   They found a bat in the house this am

## 2017-07-30 NOTE — Telephone Encounter (Signed)
Both need to go to ER for rabies vaccine, given the potential exposure.  We don't have it here.  Thanks.

## 2017-07-31 ENCOUNTER — Other Ambulatory Visit: Payer: Self-pay

## 2017-08-02 ENCOUNTER — Other Ambulatory Visit: Payer: Self-pay

## 2017-08-02 ENCOUNTER — Emergency Department
Admission: EM | Admit: 2017-08-02 | Discharge: 2017-08-02 | Disposition: A | Discharge status: Home / Self Care | Payer: PPO | Attending: Emergency Medicine | Admitting: Emergency Medicine

## 2017-08-02 ENCOUNTER — Encounter: Payer: Self-pay | Admitting: Emergency Medicine

## 2017-08-02 DIAGNOSIS — Z2914 Encounter for prophylactic rabies immune globin: Secondary | ICD-10-CM | POA: Insufficient documentation

## 2017-08-02 DIAGNOSIS — Z23 Encounter for immunization: Secondary | ICD-10-CM | POA: Insufficient documentation

## 2017-08-02 DIAGNOSIS — Z79899 Other long term (current) drug therapy: Secondary | ICD-10-CM | POA: Diagnosis not present

## 2017-08-02 DIAGNOSIS — Z880 Allergy status to penicillin: Secondary | ICD-10-CM | POA: Diagnosis not present

## 2017-08-02 DIAGNOSIS — Z87891 Personal history of nicotine dependence: Secondary | ICD-10-CM | POA: Insufficient documentation

## 2017-08-02 DIAGNOSIS — R197 Diarrhea, unspecified: Secondary | ICD-10-CM | POA: Insufficient documentation

## 2017-08-02 DIAGNOSIS — E039 Hypothyroidism, unspecified: Secondary | ICD-10-CM | POA: Insufficient documentation

## 2017-08-02 MED ORDER — RABIES VACCINE, PCEC IM SUSR
1.0000 mL | Freq: Once | INTRAMUSCULAR | Status: AC
  Administered 2017-08-02: 1 mL via INTRAMUSCULAR
  Filled 2017-08-02: qty 1

## 2017-08-02 NOTE — ED Triage Notes (Signed)
Presents for 2nd rabies

## 2017-08-02 NOTE — ED Provider Notes (Signed)
[facility] Emergency Department Provider Note  ____________________________________________   First MD Initiated Contact with Patient 08/02/17 1218     (approximate)  I have reviewed the triage vital signs and the nursing notes.   HISTORY  Chief Complaint Rabies Injection    HPI Mercedes [name] is a 72 y.o. female who presents to the ED for her second rabies vaccine.  She was exposed to a bat in her house.  She is unaware of how long the bat had been in the house.  Her first injection was given on 07/30/17.  She states she did have a little diarrhea after the injection but other than that she has been fine.  Past Medical History:  Diagnosis Date  . Allergy   . Cataract    bilateral-not an issue right now  . Diverticulosis of colon   . Fatty liver   . GERD (gastroesophageal reflux disease)   . Gout   . 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 HLD (hyperlipidemia)    under control  . HTN (hypertension)   . Hypothyroidism   . Osteoarthritis   . Rapid heartbeat    occasional, does not last long  . Tobacco abuse     Patient Active Problem List   Diagnosis Date Noted  . Back pain 12/29/2015  . Advance care planning 12/14/2014  . Vitamin D deficiency 12/14/2014  . Hyperglycemia 10/02/2012  . Medicare annual wellness visit, subsequent 10/02/2012  . Leg pain 07/09/2012  . Colon cancer screening 09/02/2011  . Osteoporosis screening 09/02/2011  . Obesity 11/08/2010  . Gout   . HLD (hyperlipidemia)   . Hypothyroidism 01/10/2007  . HYPERTENSION, BENIGN ESSENTIAL 01/10/2007  . DIVERTICULOSIS, COLON 01/10/2007  . MENOPAUSAL SYNDROME 01/10/2007  . OSTEOARTHRITIS 01/10/2007    Past Surgical History:  Procedure Laterality Date  . CATARACT EXTRACTION    . CHOLECYSTECTOMY N/A 03/17/2014   Procedure: LAPAROSCOPIC CHOLECYSTECTOMY WITH INTRAOPERATIVE CHOLANGIOGRAM ;  Surgeon: Fanny Skates, MD;  Location: [facility];  Service: General;  Laterality: N/A;  .  COLONOSCOPY     removed polyps  . SIGMOIDOSCOPY      Prior to Admission medications   Medication Sig Start Date End Date Taking? Authorizing Provider  cholecalciferol (VITAMIN D) 1000 units tablet Take 1,000 Units by mouth daily.    [provider]  colchicine 0.6 MG tablet Take 1 tablet (0.6 mg  total) by mouth 2 (two)  times daily as needed. 12/23/15   Pleas Koch, NP  fluticasone (FLONASE) 50 MCG/ACT nasal spray Place 2 sprays into both nostrils as needed. Patient not taking: Reported on 03/13/2017 07/24/14   Tonia Ghent, MD  levothyroxine (SYNTHROID, LEVOTHROID) 200 MCG tablet Take 1 tablet (200 mcg total) by mouth daily before breakfast. 12/27/16   Tonia Ghent, MD  metoprolol (TOPROL-XL) 200 MG 24 hr tablet Take 1 tablet (200 mg total) by mouth every morning. 12/27/16   Tonia Ghent, MD  Multiple Vitamin (MULTIVITAMIN WITH MINERALS) TABS tablet Take 1 tablet by mouth daily.    [provider]  Multiple Vitamins-Minerals (PRESERVISION AREDS 2) CAPS Take 1 capsule by mouth 2 (two) times daily.    [provider]  Omega 3 1000 MG CAPS Take 2,000 mg by mouth daily.    [provider]  pravastatin (PRAVACHOL) 40 MG tablet Take 1 tablet (40 mg total) by mouth daily. 12/27/16   Tonia Ghent, MD  psyllium (REGULOID) 0.52 G capsule Take 0.52 g by mouth  daily. Two tablets per day    [provider]  TURMERIC PO Take 1 capsule by mouth.    [provider]    Allergies Ace inhibitors; Clarithromycin; Fenofibrate; Lipitor [atorvastatin]; Niacin; Penicillins; Sulfasalazine; and Tetracycline hcl  Family History  Problem Relation Age of Onset  . Ulcers Father   . Angina Mother   . Cancer Brother        prostate  . Hypertension Brother   . Prostate cancer Brother   . Colon cancer Neg Hx   . Breast cancer Neg Hx   . Esophageal cancer Neg Hx   . Stomach cancer Neg Hx   . Rectal cancer Neg Hx     Social History Social  History   Tobacco Use  . Smoking status: Former Smoker    Packs/day: 0.25    Years: 15.00    Pack years: 3.75    Types: Cigarettes    Last attempt to quit: 07/04/2010    Years since quitting: 7.0  . Smokeless tobacco: Never Used  . Tobacco comment: 5 pyh current smoker, 1/2 PPD, started ~18 YOA, quit 10 years, restarted ~1 year ago  Substance Use Topics  . Alcohol use: Yes    Alcohol/week: 3.0 oz    Types: 5 Glasses of wine per week    Comment: Wine  . Drug use: No    Review of Systems  Constitutional: No fever/chills Eyes: No visual changes. ENT: No sore throat. Respiratory: Denies cough Gastrointestinal: One episode of diarrhea following the first injection Genitourinary: Negative for dysuria. Musculoskeletal: Negative for back pain. Skin: Negative for rash.    ____________________________________________   PHYSICAL EXAM:  VITAL SIGNS: ED Triage Vitals [08/02/17 1210]  Enc Vitals Group     BP      Pulse      Resp      Temp      Temp src      SpO2      Weight 216 lb (98 kg)     Height 5\' 4"  (1.626 m)     Head Circumference      Peak Flow      Pain Score      Pain Loc      Pain Edu?      Excl. in [place]?     Constitutional: Alert and oriented. Well appearing and in no acute distress. Eyes: Conjunctivae are normal.  Head: Atraumatic. Nose: No congestion/rhinnorhea. Mouth/Throat: Mucous membranes are moist.   Cardiovascular: Normal rate, regular rhythm.  Heart sounds are normal Respiratory: Normal respiratory effort.  No retractions, lungs clear to auscultation GU: deferred Musculoskeletal: FROM all extremities, warm and well perfused Neurologic:  Normal speech and language.  Skin:  Skin is warm, dry and intact. No rash noted. Psychiatric: Mood and affect are normal. Speech and behavior are normal.  ____________________________________________   LABS (all labs ordered are listed, but only abnormal results are displayed)  Labs Reviewed - No data to  display ____________________________________________   ____________________________________________  RADIOLOGY    ____________________________________________   PROCEDURES  Procedure(s) performed: No  Procedures    ____________________________________________   INITIAL IMPRESSION / ASSESSMENT AND PLAN / ED COURSE  Pertinent labs & imaging results that were available during my care of the patient were reviewed by me and considered in my medical decision making (see chart for details).  72 year old female who is here for her second injection of rabies vaccine.  She was exposed to a bat in her house.  She received  her first injection on 07/30/17.  She denies any complications with medication  On physical exam patient appears well.  Rabies vaccine 1 mL was given.  Patient is to return in 4 days for her third vaccine.  Patient states she understands will comply with our recommendations.  She was discharged in stable condition     As part of my medical decision making, I reviewed the following data within the [place] notes reviewed and incorporated, Old chart reviewed, Notes from prior ED visits and [place] Controlled Substance Database  ____________________________________________   FINAL CLINICAL IMPRESSION(S) / ED DIAGNOSES  Final diagnoses:  Encounter for repeat administration of rabies vaccination      NEW MEDICATIONS STARTED DURING THIS VISIT:  New Prescriptions   No medications on file     Note:  This document was prepared using Dragon voice recognition software and may include unintentional dictation errors.    Versie Starks, PA-C 08/02/17 1255    Caryn Section Linden Dolin, PA-C 08/02/17 1303    Schuyler Amor, MD 08/02/17 [phone]

## 2017-08-06 ENCOUNTER — Encounter: Payer: Self-pay | Admitting: Emergency Medicine

## 2017-08-06 ENCOUNTER — Other Ambulatory Visit: Payer: Self-pay

## 2017-08-06 ENCOUNTER — Emergency Department
Admission: EM | Admit: 2017-08-06 | Discharge: 2017-08-06 | Disposition: A | Discharge status: Home / Self Care | Payer: PPO | Attending: Student in an Organized Health Care Education/Training Program | Admitting: Student in an Organized Health Care Education/Training Program

## 2017-08-06 DIAGNOSIS — Z79899 Other long term (current) drug therapy: Secondary | ICD-10-CM | POA: Diagnosis not present

## 2017-08-06 DIAGNOSIS — E039 Hypothyroidism, unspecified: Secondary | ICD-10-CM | POA: Diagnosis not present

## 2017-08-06 DIAGNOSIS — Z23 Encounter for immunization: Secondary | ICD-10-CM | POA: Diagnosis not present

## 2017-08-06 DIAGNOSIS — I10 Essential (primary) hypertension: Secondary | ICD-10-CM | POA: Diagnosis not present

## 2017-08-06 DIAGNOSIS — Z87891 Personal history of nicotine dependence: Secondary | ICD-10-CM | POA: Insufficient documentation

## 2017-08-06 MED ORDER — RABIES VACCINE, PCEC IM SUSR
1.0000 mL | Freq: Once | INTRAMUSCULAR | Status: AC
  Administered 2017-08-06: 1 mL via INTRAMUSCULAR
  Filled 2017-08-06: qty 1

## 2017-08-06 NOTE — Discharge Instructions (Signed)
Return on 08/13/17 for vaccine 4 of 4

## 2017-08-06 NOTE — ED Provider Notes (Signed)
[facility] Emergency Department Provider Note ____________________________________________  Time seen: 1109  I have reviewed the triage vital signs and the nursing notes.  HISTORY  Chief Complaint  Rabies Injection  HPI Mercedes [name] is a 72 y.o. female presents to the ED for rabies vaccine injection #3 of 4.  Patient denies any interim complaints.  Past Medical History:  Diagnosis Date  . Allergy   . Cataract    bilateral-not an issue right now  . Diverticulosis of colon   . Fatty liver   . GERD (gastroesophageal reflux disease)   . Gout   . 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 HLD (hyperlipidemia)    under control  . HTN (hypertension)   . Hypothyroidism   . Osteoarthritis   . Rapid heartbeat    occasional, does not last long  . Tobacco abuse     Patient Active Problem List   Diagnosis Date Noted  . Back pain 12/29/2015  . Advance care planning 12/14/2014  . Vitamin D deficiency 12/14/2014  . Hyperglycemia 10/02/2012  . Medicare annual wellness visit, subsequent 10/02/2012  . Leg pain 07/09/2012  . Colon cancer screening 09/02/2011  . Osteoporosis screening 09/02/2011  . Obesity 11/08/2010  . Gout   . HLD (hyperlipidemia)   . Hypothyroidism 01/10/2007  . HYPERTENSION, BENIGN ESSENTIAL 01/10/2007  . DIVERTICULOSIS, COLON 01/10/2007  . MENOPAUSAL SYNDROME 01/10/2007  . OSTEOARTHRITIS 01/10/2007    Past Surgical History:  Procedure Laterality Date  . CATARACT EXTRACTION    . CHOLECYSTECTOMY N/A 03/17/2014   Procedure: LAPAROSCOPIC CHOLECYSTECTOMY WITH INTRAOPERATIVE CHOLANGIOGRAM ;  Surgeon: Fanny Skates, MD;  Location: [facility];  Service: General;  Laterality: N/A;  . COLONOSCOPY     removed polyps  . SIGMOIDOSCOPY      Prior to Admission medications   Medication Sig Start Date End Date Taking? Authorizing Provider  cholecalciferol (VITAMIN D) 1000 units tablet Take 1,000 Units by mouth daily.    [provider]   colchicine 0.6 MG tablet Take 1 tablet (0.6 mg  total) by mouth 2 (two)  times daily as needed. 12/23/15   Pleas Koch, NP  fluticasone (FLONASE) 50 MCG/ACT nasal spray Place 2 sprays into both nostrils as needed. Patient not taking: Reported on 03/13/2017 07/24/14   Tonia Ghent, MD  levothyroxine (SYNTHROID, LEVOTHROID) 200 MCG tablet Take 1 tablet (200 mcg total) by mouth daily before breakfast. 12/27/16   Tonia Ghent, MD  metoprolol (TOPROL-XL) 200 MG 24 hr tablet Take 1 tablet (200 mg total) by mouth every morning. 12/27/16   Tonia Ghent, MD  Multiple Vitamin (MULTIVITAMIN WITH MINERALS) TABS tablet Take 1 tablet by mouth daily.    [provider]  Multiple Vitamins-Minerals (PRESERVISION AREDS 2) CAPS Take 1 capsule by mouth 2 (two) times daily.    [provider]  Omega 3 1000 MG CAPS Take 2,000 mg by mouth daily.    [provider]  pravastatin (PRAVACHOL) 40 MG tablet Take 1 tablet (40 mg total) by mouth daily. 12/27/16   Tonia Ghent, MD  psyllium (REGULOID) 0.52 G capsule Take 0.52 g by mouth daily. Two tablets per day    [provider]  TURMERIC PO Take 1 capsule by mouth.    [provider]    Allergies Ace inhibitors; Clarithromycin; Fenofibrate; Lipitor [atorvastatin]; Niacin; Penicillins; Sulfasalazine; and Tetracycline hcl  Family History  Problem Relation Age of Onset  . Ulcers Father   . Angina Mother   .  Cancer Brother        prostate  . Hypertension Brother   . Prostate cancer Brother   . Colon cancer Neg Hx   . Breast cancer Neg Hx   . Esophageal cancer Neg Hx   . Stomach cancer Neg Hx   . Rectal cancer Neg Hx     Social History Social History   Tobacco Use  . Smoking status: Former Smoker    Packs/day: 0.25    Years: 15.00    Pack years: 3.75    Types: Cigarettes    Last attempt to quit: 07/04/2010    Years since quitting: 7.0  . Smokeless tobacco: Never Used  . Tobacco comment: 5 pyh  current smoker, 1/2 PPD, started ~18 YOA, quit 10 years, restarted ~1 year ago  Substance Use Topics  . Alcohol use: Yes    Alcohol/week: 3.0 oz    Types: 5 Glasses of wine per week    Comment: Wine  . Drug use: No    Review of Systems  Constitutional: Negative for fever. Eyes: Negative for visual changes. ENT: Negative for sore throat. Cardiovascular: Negative for chest pain. Respiratory: Negative for shortness of breath. Gastrointestinal: Negative for abdominal pain, vomiting and diarrhea. Genitourinary: Negative for dysuria. Musculoskeletal: Negative for back pain. Skin: Negative for rash. Neurological: Negative for headaches, focal weakness or numbness. ____________________________________________  PHYSICAL EXAM:  VITAL SIGNS: ED Triage Vitals  Enc Vitals Group     BP 08/06/17 1058 140/63     Pulse Rate 08/06/17 1058 78     Resp 08/06/17 1058 15     Temp 08/06/17 1058 98.1 F (36.7 C)     Temp Source 08/06/17 1058 Oral     SpO2 08/06/17 1058 93 %     Weight 08/06/17 1041 216 lb (98 kg)     Height 08/06/17 1041 5\' 4"  (1.626 m)     Head Circumference --      Peak Flow --      Pain Score --      Pain Loc --      Pain Edu? --      Excl. in [place]? --     Constitutional: Alert and oriented. Well appearing and in no distress. Head: Normocephalic and atraumatic. Eyes: Conjunctivae are normal. PERRL. Normal extraocular movements Cardiovascular: Normal rate, regular rhythm. Normal distal pulses. Respiratory: Normal respiratory effort. No wheezes/rales/rhonchi. Musculoskeletal: Nontender with normal range of motion in all extremities.  Neurologic:  Normal gait without ataxia. Normal speech and language. No gross focal neurologic deficits are appreciated. Skin:  Skin is warm, dry and intact. No rash noted. Psychiatric: Mood and affect are normal. Patient exhibits appropriate insight and  judgment. ____________________________________________  PROCEDURES  Procedures Rabavert 1 ml IM ____________________________________________  INITIAL IMPRESSION / ASSESSMENT AND PLAN / ED COURSE  Patient with a ED encounter for rabies vaccine #3 of 4.  Patient will return to the ED on 08/13/17 for her fourth and final rabies vaccine. ____________________________________________  FINAL CLINICAL IMPRESSION(S) / ED DIAGNOSES  Final diagnoses:  Encounter for repeat administration of rabies vaccination      Melvenia Needles, PA-C 08/06/17 1929    Merlyn Lot, MD 08/07/17 [phone]

## 2017-08-06 NOTE — ED Triage Notes (Signed)
Here for rabies injection.  

## 2017-08-08 ENCOUNTER — Ambulatory Visit (INDEPENDENT_AMBULATORY_CARE_PROVIDER_SITE_OTHER): Payer: PPO | Admitting: Family Medicine

## 2017-08-08 ENCOUNTER — Encounter: Payer: Self-pay | Admitting: Family Medicine

## 2017-08-08 VITALS — BP 132/68 | HR 79 | Temp 98.4°F | Wt 216.5 lb

## 2017-08-08 DIAGNOSIS — T148XXA Other injury of unspecified body region, initial encounter: Secondary | ICD-10-CM

## 2017-08-08 LAB — CBC WITH DIFFERENTIAL/PLATELET
BASOS ABS: 0 10*3/uL (ref 0.0–0.1)
Basophils Relative: 0.5 % (ref 0.0–3.0)
Eosinophils Absolute: 0.1 10*3/uL (ref 0.0–0.7)
Eosinophils Relative: 1.4 % (ref 0.0–5.0)
HCT: 46.5 % — ABNORMAL HIGH (ref 36.0–46.0)
Hemoglobin: 15.9 g/dL — ABNORMAL HIGH (ref 12.0–15.0)
LYMPHS ABS: 2.7 10*3/uL (ref 0.7–4.0)
Lymphocytes Relative: 34.9 % (ref 12.0–46.0)
MCHC: 34.2 g/dL (ref 30.0–36.0)
MCV: 99.2 fl (ref 78.0–100.0)
MONO ABS: 0.7 10*3/uL (ref 0.1–1.0)
MONOS PCT: 8.7 % (ref 3.0–12.0)
NEUTROS PCT: 54.5 % (ref 43.0–77.0)
Neutro Abs: 4.3 10*3/uL (ref 1.4–7.7)
Platelets: 221 10*3/uL (ref 150.0–400.0)
RBC: 4.68 Mil/uL (ref 3.87–5.11)
RDW: 12.9 % (ref 11.5–15.5)
WBC: 7.8 10*3/uL (ref 4.0–10.5)

## 2017-08-08 NOTE — Progress Notes (Signed)
Needs AMW scheduled in  June 2019.

## 2017-08-08 NOTE — Progress Notes (Signed)
She is in the midst of rabies vaccination.   The day after the 1st vaccination, she noted a purple spot on the R forearm, it may be more noted when she is more active.  The though it was a bruise.  No trauma.  This was not at the injection site.  She had some bleeding after her most recent vaccination at the injection site on the left deltoid.    H/o purpura as a child.  No other bleeding, no gum bleeding, no blood in stool, no hematuria.  No FCNAVD.    Basically, because she had a history of purpura, she wanted to get this checked out.  She hasn't had a recent gout flare.    Meds, vitals, and allergies reviewed.   ROS: Per HPI unless specifically indicated in ROS section   nad ncat OP wnl Neck supple, no LA rrr ctab abd soft, not ttp 1cm area on the R forearm that is slightly darker than surrounding tissue.  It is a macule.  It looks like a small superficial bruise.  No ulceration. Small bruise on L deltoid at prev injection site.   No palmar rash.  No petechiae.  No purpura.

## 2017-08-08 NOTE — Patient Instructions (Addendum)
Yearly medicare visit 12/2017-  Please schedule.   Go to the lab on the way out.  We'll contact you with your lab report. Take care.  Glad to see you.

## 2017-08-11 DIAGNOSIS — T148XXA Other injury of unspecified body region, initial encounter: Secondary | ICD-10-CM | POA: Insufficient documentation

## 2017-08-11 NOTE — Assessment & Plan Note (Signed)
All of this looks benign.  I think she had incidental bleeding and bruising at the injection site.  The spot on her right forearm does not look ominous.  Given her history is reasonable to check a CBC just for peace of mind.  Rationale discussed with patient.  She is well-appearing otherwise.  Okay for outpatient follow-up.  See notes on labs.

## 2017-08-13 ENCOUNTER — Other Ambulatory Visit: Payer: Self-pay

## 2017-08-13 ENCOUNTER — Emergency Department
Admission: EM | Admit: 2017-08-13 | Discharge: 2017-08-13 | Disposition: A | Discharge status: Home / Self Care | Payer: PPO | Attending: Emergency Medicine | Admitting: Emergency Medicine

## 2017-08-13 ENCOUNTER — Encounter: Payer: Self-pay | Admitting: Emergency Medicine

## 2017-08-13 DIAGNOSIS — Z23 Encounter for immunization: Secondary | ICD-10-CM | POA: Diagnosis not present

## 2017-08-13 DIAGNOSIS — Z203 Contact with and (suspected) exposure to rabies: Secondary | ICD-10-CM | POA: Insufficient documentation

## 2017-08-13 DIAGNOSIS — Z79899 Other long term (current) drug therapy: Secondary | ICD-10-CM | POA: Diagnosis not present

## 2017-08-13 DIAGNOSIS — Z2914 Encounter for prophylactic rabies immune globin: Secondary | ICD-10-CM | POA: Diagnosis not present

## 2017-08-13 DIAGNOSIS — E039 Hypothyroidism, unspecified: Secondary | ICD-10-CM | POA: Diagnosis not present

## 2017-08-13 DIAGNOSIS — Z87891 Personal history of nicotine dependence: Secondary | ICD-10-CM | POA: Diagnosis not present

## 2017-08-13 MED ORDER — RABIES VACCINE, PCEC IM SUSR
1.0000 mL | Freq: Once | INTRAMUSCULAR | Status: AC
  Administered 2017-08-13: 1 mL via INTRAMUSCULAR

## 2017-08-13 MED ORDER — RABIES VACCINE, PCEC IM SUSR
INTRAMUSCULAR | Status: AC
  Administered 2017-08-13: 1 mL via INTRAMUSCULAR
  Filled 2017-08-13: qty 1

## 2017-08-13 NOTE — ED Triage Notes (Signed)
Here for rabies injection.  

## 2017-08-13 NOTE — ED Provider Notes (Signed)
[facility] Emergency Department Provider Note   ____________________________________________   None    (approximate)  I have reviewed the triage vital signs and the nursing notes.   HISTORY  Chief Complaint Rabies Injection    HPI Mercedes [name] is a 72 y.o. female patient presents for for last injection for rabies prophylaxis.  Patient was exposed to a bat in her house.  Patient denies interim complaints.  Past Medical History:  Diagnosis Date  . Allergy   . Cataract    bilateral-not an issue right now  . Diverticulosis of colon   . Fatty liver   . GERD (gastroesophageal reflux disease)   . Gout   . 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 HLD (hyperlipidemia)    under control  . HTN (hypertension)   . Hypothyroidism   . Osteoarthritis   . Rapid heartbeat    occasional, does not last long  . Tobacco abuse     Patient Active Problem List   Diagnosis Date Noted  . Bruise 08/11/2017  . Back pain 12/29/2015  . Advance care planning 12/14/2014  . Vitamin D deficiency 12/14/2014  . Hyperglycemia 10/02/2012  . Medicare annual wellness visit, subsequent 10/02/2012  . Leg pain 07/09/2012  . Colon cancer screening 09/02/2011  . Osteoporosis screening 09/02/2011  . Obesity 11/08/2010  . Gout   . HLD (hyperlipidemia)   . Hypothyroidism 01/10/2007  . HYPERTENSION, BENIGN ESSENTIAL 01/10/2007  . DIVERTICULOSIS, COLON 01/10/2007  . MENOPAUSAL SYNDROME 01/10/2007  . OSTEOARTHRITIS 01/10/2007    Past Surgical History:  Procedure Laterality Date  . CATARACT EXTRACTION    . CHOLECYSTECTOMY N/A 03/17/2014   Procedure: LAPAROSCOPIC CHOLECYSTECTOMY WITH INTRAOPERATIVE CHOLANGIOGRAM ;  Surgeon: Fanny Skates, MD;  Location: [facility];  Service: General;  Laterality: N/A;  . COLONOSCOPY     removed polyps  . SIGMOIDOSCOPY      Prior to Admission medications   Medication Sig Start Date End Date Taking? Authorizing Provider  cholecalciferol (VITAMIN D)  1000 units tablet Take 1,000 Units by mouth daily.    [provider]  colchicine 0.6 MG tablet Take 1 tablet (0.6 mg  total) by mouth 2 (two)  times daily as needed. Patient not taking: Reported on 08/08/2017 12/23/15   Pleas Koch, NP  fluticasone [facility]) 50 MCG/ACT nasal spray Place 2 sprays into both nostrils as needed. Patient not taking: Reported on 08/08/2017 07/24/14   Tonia Ghent, MD  levothyroxine (SYNTHROID, LEVOTHROID) 200 MCG tablet Take 1 tablet (200 mcg total) by mouth daily before breakfast. 12/27/16   Tonia Ghent, MD  metoprolol (TOPROL-XL) 200 MG 24 hr tablet Take 1 tablet (200 mg total) by mouth every morning. 12/27/16   Tonia Ghent, MD  Multiple Vitamin (MULTIVITAMIN WITH MINERALS) TABS tablet Take 1 tablet by mouth daily.    [provider]  Multiple Vitamins-Minerals (PRESERVISION AREDS 2) CAPS Take 1 capsule by mouth 2 (two) times daily.    [provider]  Omega 3 1000 MG CAPS Take 2,000 mg by mouth daily.    [provider]  pravastatin (PRAVACHOL) 40 MG tablet Take 1 tablet (40 mg total) by mouth daily. 12/27/16   Tonia Ghent, MD  psyllium (REGULOID) 0.52 G capsule Take 0.52 g by mouth daily. Two tablets per day    [provider]  TURMERIC PO Take 1 capsule by mouth.    [provider]    Allergies Ace inhibitors; Clarithromycin; Fenofibrate; Lipitor [atorvastatin];  Niacin; Penicillins; Sulfasalazine; and Tetracycline hcl  Family History  Problem Relation Age of Onset  . Ulcers Father   . Angina Mother   . Cancer Brother        prostate  . Hypertension Brother   . Prostate cancer Brother   . Colon cancer Neg Hx   . Breast cancer Neg Hx   . Esophageal cancer Neg Hx   . Stomach cancer Neg Hx   . Rectal cancer Neg Hx     Social History Social History   Tobacco Use  . Smoking status: Former Smoker    Packs/day: 0.25    Years: 15.00    Pack years: 3.75    Types: Cigarettes    Last  attempt to quit: 07/04/2010    Years since quitting: 7.1  . Smokeless tobacco: Never Used  . Tobacco comment: 5 pyh current smoker, 1/2 PPD, started ~18 YOA, quit 10 years, restarted ~1 year ago  Substance Use Topics  . Alcohol use: Yes    Alcohol/week: 3.0 oz    Types: 5 Glasses of wine per week    Comment: Wine  . Drug use: No    Review of Systems  Constitutional: No fever/chills Eyes: No visual changes. ENT: No sore throat. Cardiovascular: Denies chest pain. Respiratory: Denies shortness of breath. Gastrointestinal: No abdominal pain.  No nausea, no vomiting.  No diarrhea.  No constipation. Genitourinary: Negative for dysuria. Musculoskeletal: Negative for back pain. Skin: Negative for rash. Neurological: Negative for headaches, focal weakness or numbness. Endocrine:Hypothyroidism, hyperlipidemia, and hypertension Hematological/Lymphatic: Allergic/Immunilogical: See medication list ____________________________________________   PHYSICAL EXAM:  VITAL SIGNS: ED Triage Vitals  Enc Vitals Group     BP 08/13/17 1019 (!) 151/63     Pulse Rate 08/13/17 1019 81     Resp 08/13/17 1019 18     Temp 08/13/17 1019 98.2 F (36.8 C)     Temp Source 08/13/17 1019 Oral     SpO2 08/13/17 1019 93 %     Weight 08/13/17 1014 216 lb (98 kg)     Height 08/13/17 1014 5\' 4"  (1.626 m)     Head Circumference --      Peak Flow --      Pain Score --      Pain Loc --      Pain Edu? --      Excl. in [place]? --    Constitutional: Alert and oriented. Well appearing and in no acute distress. Cardiovascular: Normal rate, regular rhythm. Grossly normal heart sounds.  Good peripheral circulation.  Elevated blood pressure Respiratory: Normal respiratory effort.  No retractions. Lungs CTAB. Musculoskeletal: No lower extremity tenderness nor edema.  No joint effusions. Neurologic:  Normal speech and language. No gross focal neurologic deficits are appreciated. No gait instability. Skin:  Skin is warm,  dry and intact. No rash noted. Psychiatric: Mood and affect are normal. Speech and behavior are normal.  ____________________________________________   LABS (all labs ordered are listed, but only abnormal results are displayed)  Labs Reviewed - No data to display ____________________________________________  EKG   ____________________________________________  RADIOLOGY  ED MD interpretation:    Official radiology report(s): No results found.  ____________________________________________   PROCEDURES  Procedure(s) performed: None  Procedures  Critical Care performed: No  ____________________________________________   INITIAL IMPRESSION / ASSESSMENT AND PLAN / ED COURSE  As part of my medical decision making, I reviewed the following data within the [place]    Patient presents for fourth  and final injection of rabies prophylactic.  Patient advised that she may now follow-up PCP.      ____________________________________________   FINAL CLINICAL IMPRESSION(S) / ED DIAGNOSES  Final diagnoses:  Need for prophylactic vaccination against rabies     ED Discharge Orders    None       Note:  This document was prepared using Dragon voice recognition software and may include unintentional dictation errors.    Sable Feil, PA-C 08/13/17 1045    Lavonia Drafts, MD 08/13/17 1158

## 2017-08-27 ENCOUNTER — Other Ambulatory Visit: Payer: Self-pay | Admitting: *Deleted

## 2017-08-27 ENCOUNTER — Encounter: Payer: Self-pay | Admitting: *Deleted

## 2017-08-27 NOTE — Patient Outreach (Signed)
Called today to follow up on pt's hx of exposure to a bat in their home and screen for need for further resources or care management needs. I spoke with [name] who reports they are fine. They have never had that problem before. They are not sure how the bat got into their home. They had [employer] come out and do a home assessment and did not find any other bats.  I explained our services to [name] and she denies that she or her husband have any needs at this time.  I will send them a letter for future reference.  Mercedes Pont. Myrtie Neither, MSN, [facility] Gerontological Nurse Practitioner [facility] Care Management [phone]

## 2017-08-27 NOTE — Patient Outreach (Signed)
Called today to follow up on pt's hx of exposure to a bat in their home and screen for need for further resources or care management needs. I spoke with [name] who reports they are fine. They have never had that problem before. They are not sure how the bat got into their home. They had [employer] come out and do a home assessment and did not find any other bats.  I explained our services to Mrs. Rybolt and she denies that she or her husband have any needs at this time.  I will send them a letter for future reference.  Eulah Pont. Myrtie Neither, MSN, [facility] Gerontological Nurse Practitioner [facility] Care Management [phone]

## 2017-10-22 ENCOUNTER — Ambulatory Visit: Payer: Self-pay | Admitting: *Deleted

## 2017-10-22 ENCOUNTER — Encounter: Payer: Self-pay | Admitting: Emergency Medicine

## 2017-10-22 ENCOUNTER — Emergency Department
Admission: EM | Admit: 2017-10-22 | Discharge: 2017-10-22 | Disposition: A | Discharge status: Home / Self Care | Payer: PPO | Attending: Emergency Medicine | Admitting: Emergency Medicine

## 2017-10-22 DIAGNOSIS — X500XXA Overexertion from strenuous movement or load, initial encounter: Secondary | ICD-10-CM | POA: Insufficient documentation

## 2017-10-22 DIAGNOSIS — Y9239 Other specified sports and athletic area as the place of occurrence of the external cause: Secondary | ICD-10-CM | POA: Insufficient documentation

## 2017-10-22 DIAGNOSIS — E039 Hypothyroidism, unspecified: Secondary | ICD-10-CM | POA: Insufficient documentation

## 2017-10-22 DIAGNOSIS — M7711 Lateral epicondylitis, right elbow: Secondary | ICD-10-CM | POA: Diagnosis not present

## 2017-10-22 DIAGNOSIS — Z79899 Other long term (current) drug therapy: Secondary | ICD-10-CM | POA: Diagnosis not present

## 2017-10-22 DIAGNOSIS — Y93B3 Activity, free weights: Secondary | ICD-10-CM | POA: Diagnosis not present

## 2017-10-22 DIAGNOSIS — M79601 Pain in right arm: Secondary | ICD-10-CM | POA: Diagnosis present

## 2017-10-22 DIAGNOSIS — Y999 Unspecified external cause status: Secondary | ICD-10-CM | POA: Diagnosis not present

## 2017-10-22 DIAGNOSIS — Z87891 Personal history of nicotine dependence: Secondary | ICD-10-CM | POA: Diagnosis not present

## 2017-10-22 DIAGNOSIS — I10 Essential (primary) hypertension: Secondary | ICD-10-CM | POA: Insufficient documentation

## 2017-10-22 MED ORDER — DICLOFENAC SODIUM 1 % TD GEL: 2.0000 g | Freq: Four times a day (QID) | TRANSDERMAL | 0 refills | Status: AC

## 2017-10-22 NOTE — Discharge Instructions (Addendum)
Your exam is consistent with a epicondylitis. Consider obtaining a counterforce strap for the elbow (a prescription is provided). Follow-up with your provider or ortho for persistent symptoms. Use the antiinflammatory gel as prescribed. Use ice massage as demonstrated.

## 2017-10-22 NOTE — Telephone Encounter (Signed)
This is the skype that was sent to me by Valli Glance RN. [10/22/2017 1:44 PM]  Valli GlanceYuma Blucher Pyle [phone] experiencing severe pain in R elbow with swelling to her finger tips. She belongs to Dr Damita Dunnings. Is there any way he can see her today?   [10/22/2017 1:47 PM]  Ozzie Hoyle:   i am asking be right back.   We saved this conversation. You'll see it soon in the Conversations tab in [employer] and in the [facility] folder in [place].  [10/22/2017 2:05 PM]  Ozzie Hoyle:   did pt have an injury or fall? and when did pain and swelling start?   [10/22/2017 2:07 PM]  Valli Glance:   she thought she had over worked it last week- but she has been using OTC rubs and pain meds and it is not improving   [10/22/2017 2:07 PM]  Ozzie Hoyle:   be right back   [10/22/2017 2:07 PM]  Valli Glance:   swelling started Sunday   [10/22/2017 2:09 PM]  Valli Glance:   swelling gets better if arm is upright- gets worse when she uses it.   [10/22/2017 2:10 PM]  Ozzie Hoyle:   is the arm red or feel warm or does pt have fever?   [10/22/2017 2:11 PM]  Valli Glance:   not red-arm is warm- but no fever with patient   [10/22/2017 2:12 PM]  Ozzie Hoyle:   OK Dr Damita Dunnings said pt should go to ED for eval now.   [10/22/2017 2:12 PM]  Valli Glance:   ok will tell her   [10/22/2017 2:12 PM]  Ozzie Hoyle:   thank you.

## 2017-10-22 NOTE — ED Triage Notes (Addendum)
Pt reports right lower arm pain from right elbow to right wrist, started Sunday. Reports last Thursday she started lifting weights. Pt also reports swelling to right hand and lower arm. Pt denies any recent injury, denies pain radiating anywhere. Pt also denies any chest pain or shortness of breath, even and nonlabored respirations noted.

## 2017-10-22 NOTE — ED Provider Notes (Signed)
ED ECG REPORT I, Rudene Re, the attending physician, personally viewed and interpreted this ECG.  NSR, rate 79, normal intervals, normal axis, no STE or depression. Unchanged from prior   [place], [place], MD 10/22/17 [phone]

## 2017-10-22 NOTE — Telephone Encounter (Signed)
Patient states she is having pain in her elbow. She had thought she had hurt herself- but after several days of OTC treatment with rubs and pain medication- She feels she may have something else happening. She is not improving.Swelling started Sunday- it does ever go away- but gets better if she elevate and doesn't use it. Per Dr Damita Dunnings- patient should be evaluated at ED- patient notified- she will go per her doctor's suggestion.  Reason for Disposition . [1] SEVERE pain AND [2] not improved 2 hours after pain medicine  Answer Assessment - Initial Assessment Questions 1. ONSET: "When did the pain start?"     Pain in right arm since last last Friday 2. LOCATION: "Where is the pain located?"     Primarily in elbow- does radiate down and up from the elbow 3. PAIN: "How bad is the pain?" (Scale 1-10; or mild, moderate, severe)   - MILD (1-3): doesn't interfere with normal activities   - MODERATE (4-7): interferes with normal activities (e.g., work or school) or awakens from sleep   - SEVERE (8-10): excruciating pain, unable to do any normal activities, unable to hold a cup of water     Depending on position- 6 or 7 4. WORK OR EXERCISE: "Has there been any recent work or exercise that involved this part of the body?"     Weight lifting on Thursday 5. CAUSE: "What do you think is causing the arm pain?"     Unsure- aggrivated the joint- but it has gotten worse with time 6. OTHER SYMPTOMS: "Do you have any other symptoms?" (e.g., neck pain, swelling, rash, fever, numbness, weakness)     Swelling-from elbow to tips of fingers- almost like there is a constriction, tips of fingers- slight numbness, just when she tries to pick up something 7. PREGNANCY: "Is there any chance you are pregnant?" "When was your last menstrual period?"     n/a  Protocols used: ELBOW PAIN-A-AH, ARM PAIN-A-AH

## 2017-10-25 NOTE — ED Provider Notes (Signed)
[facility] Emergency Department Provider Note ____________________________________________  Time seen: 1532  I have reviewed the triage vital signs and the nursing notes.  HISTORY  Chief Complaint  Arm Pain  HPI Mercedes [name] is a 72 y.o. female presents to the ED accompanied by her husband, for evaluation of pain to the elbow and distal forearm.  Patient describes onset was on last Thursday, she was working with a barbell weight at the [employer].  She describes pain when she attempted to pull the barbell up in a high pull position.  Since that time she had increasing pain to the lateral aspect of the elbow.  She also notes pain with increased flexion and rotation of the wrist.  She denies any direct injury by trauma or contusion.  She also denies any distal paresthesias but she does note some swelling to the fingers of the hand.  Patient is without any chest pain, shortness of breath, or syncope.  Past Medical History:  Diagnosis Date  . Allergy   . Cataract    bilateral-not an issue right now  . Diverticulosis of colon   . Fatty liver   . GERD (gastroesophageal reflux disease)   . Gout   . 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 HLD (hyperlipidemia)    under control  . HTN (hypertension)   . Hypothyroidism   . Osteoarthritis   . Rapid heartbeat    occasional, does not last long  . Tobacco abuse     Patient Active Problem List   Diagnosis Date Noted  . Bruise 08/11/2017  . Back pain 12/29/2015  . Advance care planning 12/14/2014  . Vitamin D deficiency 12/14/2014  . Hyperglycemia 10/02/2012  . Medicare annual wellness visit, subsequent 10/02/2012  . Leg pain 07/09/2012  . Colon cancer screening 09/02/2011  . Osteoporosis screening 09/02/2011  . Obesity 11/08/2010  . Gout   . HLD (hyperlipidemia)   . Hypothyroidism 01/10/2007  . HYPERTENSION, BENIGN ESSENTIAL 01/10/2007  . DIVERTICULOSIS, COLON 01/10/2007  . MENOPAUSAL SYNDROME 01/10/2007  .  OSTEOARTHRITIS 01/10/2007    Past Surgical History:  Procedure Laterality Date  . CATARACT EXTRACTION    . CHOLECYSTECTOMY N/A 03/17/2014   Procedure: LAPAROSCOPIC CHOLECYSTECTOMY WITH INTRAOPERATIVE CHOLANGIOGRAM ;  Surgeon: Fanny Skates, MD;  Location: [facility];  Service: General;  Laterality: N/A;  . COLONOSCOPY     removed polyps  . SIGMOIDOSCOPY      Prior to Admission medications   Medication Sig Start Date End Date Taking? Authorizing Provider  cholecalciferol (VITAMIN D) 1000 units tablet Take 1,000 Units by mouth daily.    [provider]  colchicine 0.6 MG tablet Take 1 tablet (0.6 mg  total) by mouth 2 (two)  times daily as needed. Patient not taking: Reported on 08/08/2017 12/23/15   Pleas Koch, NP  diclofenac sodium (VOLTAREN) 1 % GEL Apply 2 g topically 4 (four) times daily for 13 days. 10/22/17 11/04/17  Bradin Mcadory, Dannielle Karvonen, PA-C  fluticasone (FLONASE) 50 MCG/ACT nasal spray Place 2 sprays into both nostrils as needed. Patient not taking: Reported on 08/08/2017 07/24/14   Tonia Ghent, MD  levothyroxine (SYNTHROID, LEVOTHROID) 200 MCG tablet Take 1 tablet (200 mcg total) by mouth daily before breakfast. 12/27/16   Tonia Ghent, MD  metoprolol (TOPROL-XL) 200 MG 24 hr tablet Take 1 tablet (200 mg total) by mouth every morning. 12/27/16   Tonia Ghent, MD  Multiple Vitamin (MULTIVITAMIN WITH MINERALS) TABS tablet Take 1 tablet  by mouth daily.    [provider]  Multiple Vitamins-Minerals (PRESERVISION AREDS 2) CAPS Take 1 capsule by mouth 2 (two) times daily.    [provider]  Omega 3 1000 MG CAPS Take 2,000 mg by mouth daily.    [provider]  pravastatin (PRAVACHOL) 40 MG tablet Take 1 tablet (40 mg total) by mouth daily. 12/27/16   Tonia Ghent, MD  psyllium (REGULOID) 0.52 G capsule Take 0.52 g by mouth daily. Two tablets per day    [provider]  TURMERIC PO Take 1 capsule by mouth.    [provider]    Allergies Ace inhibitors; Clarithromycin; Fenofibrate; Lipitor [atorvastatin]; Niacin; Penicillins; Sulfasalazine; and Tetracycline hcl  Family History  Problem Relation Age of Onset  . Ulcers Father   . Angina Mother   . Cancer Brother        prostate  . Hypertension Brother   . Prostate cancer Brother   . Colon cancer Neg Hx   . Breast cancer Neg Hx   . Esophageal cancer Neg Hx   . Stomach cancer Neg Hx   . Rectal cancer Neg Hx     Social History Social History   Tobacco Use  . Smoking status: Former Smoker    Packs/day: 0.25    Years: 15.00    Pack years: 3.75    Types: Cigarettes    Last attempt to quit: 07/04/2010    Years since quitting: 7.3  . Smokeless tobacco: Never Used  . Tobacco comment: 5 pyh current smoker, 1/2 PPD, started ~18 YOA, quit 10 years, restarted ~1 year ago  Substance Use Topics  . Alcohol use: Yes    Alcohol/week: 3.0 oz    Types: 5 Glasses of wine per week    Comment: Wine  . Drug use: No    Review of Systems  Constitutional: Negative for fever. Cardiovascular: Negative for chest pain. Respiratory: Negative for shortness of breath. Musculoskeletal: Negative for back pain.  Right forearm pain as above. Skin: Negative for rash. Neurological: Negative for headaches, focal weakness or numbness. ____________________________________________  PHYSICAL EXAM:  VITAL SIGNS: ED Triage Vitals  Enc Vitals Group     BP 10/22/17 1511 (!) 177/74     Pulse Rate 10/22/17 1511 82     Resp 10/22/17 1511 20     Temp 10/22/17 1511 98.9 F (37.2 C)     Temp Source 10/22/17 1511 Oral     SpO2 10/22/17 1511 97 %     Weight 10/22/17 1511 200 lb (90.7 kg)     Height 10/22/17 1511 5\' 4"  (1.626 m)     Head Circumference --      Peak Flow --      Pain Score 10/22/17 1507 5     Pain Loc --      Pain Edu? --      Excl. in [place]? --     Constitutional: Alert and oriented. Well appearing and in no distress. Head: Normocephalic and  atraumatic. Mouth/Throat: Mucous membranes are moist. Neck: Supple. No thyromegaly. Cardiovascular: Normal rate, regular rhythm. Normal distal pulses. Respiratory: Normal respiratory effort. No wheezes/rales/rhonchi. Musculoskeletal: Right upper extremity without any obvious deformity or dislocation.  Patient is tender to palpation to the lateral epicondyle.  She has normal composite fist and normal grip strength.  Nontender with normal range of motion in all extremities.  Neurologic: Cranial nerves II through XII grossly intact.  Normal UE DTRs bilaterally.  Normal gait without  ataxia. Normal speech and language. No gross focal neurologic deficits are appreciated. Skin:  Skin is warm, dry and intact. No rash noted. ____________________________________________  EKG  NSR 79 Normal Axis Normal intervals. No STEMI ____________________________________________  PROCEDURES  Procedures Velcro wrist cock-up splint ____________________________________________  INITIAL IMPRESSION / ASSESSMENT AND PLAN / ED COURSE  Patient with ED evaluation of right upper extremity pain with forearm discomfort.  Her symptoms are consistent with a lateral epicondylitis.  Patient is placed in a wrist cock-up splint for immobilization.  She is advised that a counterforce strap may be more helpful for her.  A prescription is written for her to attempt to find a device at a local DME provider.  Given a prescription for Voltaren gel dose topically as directed.  She will follow-up with her primary provider or Bobette Mo for ongoing symptom management.  Return precautions have been reviewed. ____________________________________________  FINAL CLINICAL IMPRESSION(S) / ED DIAGNOSES  Final diagnoses:  Lateral epicondylitis of right elbow      Carmie End, Dannielle Karvonen, PA-C 10/25/17 [place], [place], MD 10/30/17 [phone]

## 2017-10-28 DIAGNOSIS — D179 Benign lipomatous neoplasm, unspecified: Secondary | ICD-10-CM | POA: Diagnosis not present

## 2017-10-28 DIAGNOSIS — Z1283 Encounter for screening for malignant neoplasm of skin: Secondary | ICD-10-CM | POA: Diagnosis not present

## 2017-10-28 DIAGNOSIS — L821 Other seborrheic keratosis: Secondary | ICD-10-CM | POA: Diagnosis not present

## 2017-10-28 DIAGNOSIS — L814 Other melanin hyperpigmentation: Secondary | ICD-10-CM | POA: Diagnosis not present

## 2017-10-28 DIAGNOSIS — L578 Other skin changes due to chronic exposure to nonionizing radiation: Secondary | ICD-10-CM | POA: Diagnosis not present

## 2017-10-28 DIAGNOSIS — D2239 Melanocytic nevi of other parts of face: Secondary | ICD-10-CM | POA: Diagnosis not present

## 2017-10-28 DIAGNOSIS — L918 Other hypertrophic disorders of the skin: Secondary | ICD-10-CM | POA: Diagnosis not present

## 2017-10-28 DIAGNOSIS — D225 Melanocytic nevi of trunk: Secondary | ICD-10-CM | POA: Diagnosis not present

## 2017-10-28 DIAGNOSIS — L720 Epidermal cyst: Secondary | ICD-10-CM | POA: Diagnosis not present

## 2017-10-28 DIAGNOSIS — D1800 Hemangioma unspecified site: Secondary | ICD-10-CM | POA: Diagnosis not present

## 2017-11-27 ENCOUNTER — Other Ambulatory Visit: Payer: Self-pay | Admitting: Family Medicine

## 2017-11-27 NOTE — Telephone Encounter (Signed)
Sent. Thanks.  Okay to continue. 

## 2017-11-27 NOTE — Telephone Encounter (Signed)
Electronic refill request Upcoming appointment scheduled for 01/20/18 See drug warning See allergy/contraindication

## 2017-12-03 ENCOUNTER — Other Ambulatory Visit: Payer: Self-pay | Admitting: Family Medicine

## 2017-12-03 NOTE — Telephone Encounter (Signed)
I spoke with Mercedes [name] at [facility] and she said pt picked up 3 mth supply for levothyroxine, metoprolol and pravastatin on 11/06/17. Pt has 11/27/17 rx for each med on hold. I spoke with pt and she cannot find the bag that all three meds were in; pt said she either misplaced it or threw it out. Pt will contact [facility] to get these 3 meds refilled. Pt is willing to pay out of pocket if needed.Colchicine was not one of the meds requested. Pt will cb if needed.

## 2017-12-03 NOTE — Telephone Encounter (Signed)
Copied from [place] [id number]. Topic: Inquiry >> Dec 03, 2017  9:26 AM Pricilla Handler wrote: Reason for CRM: Patient called requesting refills of the following three medications: Colchicine 0.6 MG tablet, Levothyroxine (SYNTHROID, LEVOTHROID) 200 MCG tablet, and Metoprolol (TOPROL-XL) 200 MG 24 hr tablet. Patient has misplaced her last refill for these medications that she received from her pharmacy. Patient has misplaced the bag they were all in, and needs refills asap.  Patient's preferred pharmacy is [facility] [address], [place] - [place] [phone] (Phone) [phone] (Fax)  Please call [phone] today.

## 2017-12-03 NOTE — Telephone Encounter (Signed)
Copied from [place] [id number]. Topic: Inquiry >> Dec 03, 2017  9:26 AM Pricilla Handler wrote: Reason for CRM: Patient called requesting refills of the following three medications: Colchicine 0.6 MG tablet, Levothyroxine (SYNTHROID, LEVOTHROID) 200 MCG tablet, and Metoprolol (TOPROL-XL) 200 MG 24 hr tablet. Patient has misplaced her last refill for these medications that she received from her pharmacy. Patient has misplaced the bag they were all in, and needs refills asap.  Patient's preferred pharmacy is [facility] [address] [place] - [place] [phone] (Phone) [phone] (Fax)  Please call [phone] today.

## 2017-12-06 ENCOUNTER — Encounter: Payer: Self-pay | Admitting: Family Medicine

## 2017-12-06 ENCOUNTER — Ambulatory Visit (INDEPENDENT_AMBULATORY_CARE_PROVIDER_SITE_OTHER): Payer: PPO | Admitting: Family Medicine

## 2017-12-06 VITALS — BP 122/78 | HR 61 | Temp 98.4°F | Ht 64.0 in | Wt 216.5 lb

## 2017-12-06 DIAGNOSIS — R300 Dysuria: Secondary | ICD-10-CM

## 2017-12-06 LAB — POC URINALSYSI DIPSTICK (AUTOMATED)
Bilirubin, UA: NEGATIVE
Glucose, UA: NEGATIVE
Ketones, UA: NEGATIVE
NITRITE UA: NEGATIVE
Protein, UA: NEGATIVE
Spec Grav, UA: 1.01 (ref 1.010–1.025)
UROBILINOGEN UA: 0.2 U/dL
pH, UA: 6.5 (ref 5.0–8.0)

## 2017-12-06 MED ORDER — NITROFURANTOIN MONOHYD MACRO 100 MG PO CAPS: 100.0000 mg | ORAL_CAPSULE | Freq: Two times a day (BID) | ORAL | 0 refills | Status: DC

## 2017-12-06 NOTE — Patient Instructions (Signed)
Drink plenty of water and start the antibiotics if you get worse.  We'll contact you with your lab report.  Take care.

## 2017-12-06 NOTE — Progress Notes (Signed)
Dysuria: yes.  She has urgency and frequency in the AMs.   duration of symptoms: about 1 week abdominal pain: no fevers:no back pain: only at baseline.   vomiting:no U/a d/w pt.    Meds, vitals, and allergies reviewed.  Per HPI unless specifically indicated in ROS section   GEN: nad, alert and oriented NECK: supple CV: rrr.  PULM: ctab, no inc wob ABD: soft, +bs, suprapubic area not tender EXT: no edema SKIN: well perfused BACK: no CVA pain

## 2017-12-07 LAB — URINE CULTURE
MICRO NUMBER:: 90687016
Result:: NO GROWTH
SPECIMEN QUALITY: ADEQUATE

## 2017-12-08 DIAGNOSIS — R300 Dysuria: Secondary | ICD-10-CM | POA: Insufficient documentation

## 2017-12-08 NOTE — Assessment & Plan Note (Signed)
Possible cystitis.  Mild symptoms.  Check urine culture.  Hold antibiotics for now.  Start if worse.  See notes on labs.  Update me as needed.  Nontoxic.  She agrees.

## 2017-12-24 DIAGNOSIS — H35722 Serous detachment of retinal pigment epithelium, left eye: Secondary | ICD-10-CM | POA: Diagnosis not present

## 2017-12-30 DIAGNOSIS — H353132 Nonexudative age-related macular degeneration, bilateral, intermediate dry stage: Secondary | ICD-10-CM | POA: Diagnosis not present

## 2018-01-06 DIAGNOSIS — H353132 Nonexudative age-related macular degeneration, bilateral, intermediate dry stage: Secondary | ICD-10-CM | POA: Diagnosis not present

## 2018-01-13 ENCOUNTER — Other Ambulatory Visit: Payer: Self-pay | Admitting: Family Medicine

## 2018-01-13 ENCOUNTER — Ambulatory Visit: Payer: PPO

## 2018-01-13 DIAGNOSIS — E785 Hyperlipidemia, unspecified: Secondary | ICD-10-CM

## 2018-01-13 DIAGNOSIS — E038 Other specified hypothyroidism: Secondary | ICD-10-CM

## 2018-01-13 DIAGNOSIS — M109 Gout, unspecified: Secondary | ICD-10-CM

## 2018-01-13 DIAGNOSIS — E559 Vitamin D deficiency, unspecified: Secondary | ICD-10-CM

## 2018-01-14 ENCOUNTER — Ambulatory Visit (INDEPENDENT_AMBULATORY_CARE_PROVIDER_SITE_OTHER): Payer: PPO

## 2018-01-14 VITALS — BP 126/72 | HR 62 | Temp 97.8°F | Ht 65.0 in | Wt 219.0 lb

## 2018-01-14 DIAGNOSIS — E785 Hyperlipidemia, unspecified: Secondary | ICD-10-CM

## 2018-01-14 DIAGNOSIS — E038 Other specified hypothyroidism: Secondary | ICD-10-CM | POA: Diagnosis not present

## 2018-01-14 DIAGNOSIS — M109 Gout, unspecified: Secondary | ICD-10-CM | POA: Diagnosis not present

## 2018-01-14 DIAGNOSIS — E559 Vitamin D deficiency, unspecified: Secondary | ICD-10-CM

## 2018-01-14 DIAGNOSIS — Z0000 Encounter for general adult medical examination without abnormal findings: Secondary | ICD-10-CM

## 2018-01-14 LAB — COMPREHENSIVE METABOLIC PANEL
ALK PHOS: 64 U/L (ref 39–117)
ALT: 28 U/L (ref 0–35)
AST: 24 U/L (ref 0–37)
Albumin: 4.3 g/dL (ref 3.5–5.2)
BILIRUBIN TOTAL: 1.1 mg/dL (ref 0.2–1.2)
BUN: 22 mg/dL (ref 6–23)
CALCIUM: 10.7 mg/dL — AB (ref 8.4–10.5)
CO2: 32 mEq/L (ref 19–32)
Chloride: 103 mEq/L (ref 96–112)
Creatinine, Ser: 0.88 mg/dL (ref 0.40–1.20)
GFR: 66.7 mL/min (ref >60.00)
GLUCOSE: 107 mg/dL — AB (ref 70–99)
Potassium: 5.2 mEq/L — ABNORMAL HIGH (ref 3.5–5.1)
Sodium: 141 mEq/L (ref 135–145)
TOTAL PROTEIN: 7.7 g/dL (ref 6.0–8.3)

## 2018-01-14 LAB — LIPID PANEL
CHOL/HDL RATIO: 4
Cholesterol: 180 mg/dL (ref 0–200)
HDL: 42.3 mg/dL (ref >39.00)
NONHDL: 137.33
TRIGLYCERIDES: 312 mg/dL — AB (ref 0.0–149.0)
VLDL: 62.4 mg/dL — ABNORMAL HIGH (ref 0.0–40.0)

## 2018-01-14 LAB — VITAMIN D 25 HYDROXY (VIT D DEFICIENCY, FRACTURES): VITD: 40.68 ng/mL (ref 30.00–100.00)

## 2018-01-14 LAB — URIC ACID: Uric Acid, Serum: 8.9 mg/dL — ABNORMAL HIGH (ref 2.4–7.0)

## 2018-01-14 LAB — LDL CHOLESTEROL, DIRECT: LDL DIRECT: 104 mg/dL

## 2018-01-14 LAB — TSH: TSH: 0.95 u[IU]/mL (ref 0.35–4.50)

## 2018-01-14 NOTE — Patient Instructions (Signed)
Mercedes [name] , Thank you for taking time to come for your Medicare Wellness Visit. I appreciate your ongoing commitment to your health goals. Please review the following plan we discussed and let me know if I can assist you in the future.   These are the goals we discussed: Goals    . Weight < 200 lb (90.719 kg)     Target weight loss is 50 lbs. Starting 01/14/2018, I will continue to drink protein shakes for 2 meals and minimize intake of carbs during dinner. Daily intake of carbs does not exceed 20 grams.         This is a list of the screening recommended for you and due dates:  Health Maintenance  Topic Date Due  . Flu Shot  01/30/2018  . Mammogram  04/16/2019  . Colon Cancer Screening  03/13/2020  . Tetanus Vaccine  07/31/2027  . DEXA scan (bone density measurement)  Completed  .  Hepatitis C: One time screening is recommended by Center for Disease Control  (CDC) for  adults born from [birth year] through 1967.   Completed  . Pneumonia vaccines  Completed   Preventive Care for Adults  A healthy lifestyle and preventive care can promote health and wellness. Preventive health guidelines for adults include the following key practices.  . A routine yearly physical is a good way to check with your health care provider about your health and preventive screening. It is a chance to share any concerns and updates on your health and to receive a thorough exam.  . Visit your dentist for a routine exam and preventive care every 6 months. Brush your teeth twice a day and floss once a day. Good oral hygiene prevents tooth decay and gum disease.  . The frequency of eye exams is based on your age, health, family medical history, use  of contact lenses, and other factors. Follow your health care provider's recommendations for frequency of eye exams.  . Eat a healthy diet. Foods like vegetables, fruits, whole grains, low-fat dairy products, and lean protein foods contain the nutrients you need without  too many calories. Decrease your intake of foods high in solid fats, added sugars, and salt. Eat the right amount of calories for you. Get information about a proper diet from your health care provider, if necessary.  . Regular physical exercise is one of the most important things you can do for your health. Most adults should get at least 150 minutes of moderate-intensity exercise (any activity that increases your heart rate and causes you to sweat) each week. In addition, most adults need muscle-strengthening exercises on 2 or more days a week.  Silver Sneakers may be a benefit available to you. To determine eligibility, you may visit the website: www.silversneakers.com or contact program at (907)129-5785 Mon-Fri between 8AM-8PM.   . Maintain a healthy weight. The body mass index (BMI) is a screening tool to identify possible weight problems. It provides an estimate of body fat based on height and weight. Your health care provider can find your BMI and can help you achieve or maintain a healthy weight.   For adults 20 years and older: ? A BMI below 18.5 is considered underweight. ? A BMI of 18.5 to 24.9 is normal. ? A BMI of 25 to 29.9 is considered overweight. ? A BMI of 30 and above is considered obese.   . Maintain normal blood lipids and cholesterol levels by exercising and minimizing your intake of saturated fat. Eat a balanced  diet with plenty of fruit and vegetables. Blood tests for lipids and cholesterol should begin at age 41 and be repeated every 5 years. If your lipid or cholesterol levels are high, you are over 50, or you are at high risk for heart disease, you may need your cholesterol levels checked more frequently. Ongoing high lipid and cholesterol levels should be treated with medicines if diet and exercise are not working.  . If you smoke, find out from your health care provider how to quit. If you do not use tobacco, please do not start.  . If you choose to drink alcohol,  please do not consume more than 2 drinks per day. One drink is considered to be 12 ounces (355 mL) of beer, 5 ounces (148 mL) of wine, or 1.5 ounces (44 mL) of liquor.  . If you are 6-66 years old, ask your health care provider if you should take aspirin to prevent strokes.  . Use sunscreen. Apply sunscreen liberally and repeatedly throughout the day. You should seek shade when your shadow is shorter than you. Protect yourself by wearing long sleeves, pants, a wide-brimmed hat, and sunglasses year round, whenever you are outdoors.  . Once a month, do a whole body skin exam, using a mirror to look at the skin on your back. Tell your health care provider of new moles, moles that have irregular borders, moles that are larger than a pencil eraser, or moles that have changed in shape or color.

## 2018-01-14 NOTE — Progress Notes (Signed)
Subjective:   Mercedes [name] is a 72 y.o. female who presents for Medicare Annual (Subsequent) preventive examination.  Review of Systems:  N/A Cardiac Risk Factors include: advanced age (>63men, >37 women);obesity (BMI >30kg/m2);hypertension     Objective:     Vitals: BP 126/72 (BP Location: Right Arm, Patient Position: Sitting, Cuff Size: Large)   Pulse 62   Temp 97.8 F (36.6 C) (Oral)   Ht 5\' 5"  (1.651 m) Comment: shoes  Wt 219 lb (99.3 kg)   SpO2 95%   BMI 36.44 kg/m   Body mass index is 36.44 kg/m.  Advanced Directives 01/14/2018 08/13/2017 08/06/2017 08/02/2017 07/30/2017 03/13/2017 12/23/2015  Does Patient Have a Medical Advance Directive? Yes No - No No Yes Yes  Type of Paramedic of [place];Living will - - - - Press photographer;Living will [place]  Does patient want to make changes to medical advance directive? - - - - - - No - Patient declined  Copy of [place] in Chart? No - copy requested - - - - No - copy requested No - copy requested  Would patient like information on creating a medical advance directive? - No - Patient declined No - Patient declined No - Patient declined No - Patient declined - -    Tobacco Social History   Tobacco Use  Smoking Status Former Smoker  . Packs/day: 0.25  . Years: 15.00  . Pack years: 3.75  . Types: Cigarettes  . Last attempt to quit: 07/04/2010  . Years since quitting: 7.5  Smokeless Tobacco Never Used  Tobacco Comment   5 pyh current smoker, 1/2 PPD, started ~18 YOA, quit 10 years, restarted ~1 year ago     Counseling given: No Comment: 5 pyh current smoker, 1/2 PPD, started ~18 YOA, quit 10 years, restarted ~1 year ago   Clinical Intake:  Pre-visit preparation completed: Yes  Pain : No/denies pain Pain Score: 0-No pain     Nutritional Status: BMI > 30  Obese Nutritional Risks: None Diabetes: No  How often do you need to have someone  help you when you read instructions, pamphlets, or other written materials from your doctor or pharmacy?: 1 - Never What is the last grade level you completed in school?: Masters degrees (2)   Interpreter Needed?: No  Comments: pt lives with spouse Information entered by :: LPinson, LPN  Past Medical History:  Diagnosis Date  . Allergy   . Cataract    bilateral-not an issue right now  . Diverticulosis of colon   . Fatty liver   . GERD (gastroesophageal reflux disease)   . Gout   . 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 HLD (hyperlipidemia)    under control  . HTN (hypertension)   . Hypothyroidism   . Osteoarthritis   . Rapid heartbeat    occasional, does not last long  . Tobacco abuse    Past Surgical History:  Procedure Laterality Date  . CATARACT EXTRACTION    . CHOLECYSTECTOMY N/A 03/17/2014   Procedure: LAPAROSCOPIC CHOLECYSTECTOMY WITH INTRAOPERATIVE CHOLANGIOGRAM ;  Surgeon: Fanny Skates, MD;  Location: [facility];  Service: General;  Laterality: N/A;  . COLONOSCOPY     removed polyps  . SIGMOIDOSCOPY     Family History  Problem Relation Age of Onset  . Ulcers Father   . Angina Mother   . Cancer Brother        prostate  . Hypertension Brother   .  Prostate cancer Brother   . Colon cancer Neg Hx   . Breast cancer Neg Hx   . Esophageal cancer Neg Hx   . Stomach cancer Neg Hx   . Rectal cancer Neg Hx    Social History   Socioeconomic History  . Marital status: Married    Spouse name: Not on file  . Number of children: 2  . Years of education: Not on file  . Highest education level: Not on file  Occupational History  . Occupation: Teacher-retired  Social Needs  . Financial resource strain: Not on file  . Food insecurity:    Worry: Not on file    Inability: Not on file  . Transportation needs:    Medical: Not on file    Non-medical: Not on file  Tobacco Use  . Smoking status: Former Smoker    Packs/day: 0.25    Years: 15.00    Pack years: 3.75    Types:  Cigarettes    Last attempt to quit: 07/04/2010    Years since quitting: 7.5  . Smokeless tobacco: Never Used  . Tobacco comment: 5 pyh current smoker, 1/2 PPD, started ~18 YOA, quit 10 years, restarted ~1 year ago  Substance and Sexual Activity  . Alcohol use: Yes    Alcohol/week: 3.0 oz    Types: 5 Glasses of wine per week    Comment: Wine  . Drug use: No  . Sexual activity: Never  Lifestyle  . Physical activity:    Days per week: Not on file    Minutes per session: Not on file  . Stress: Not on file  Relationships  . Social connections:    Talks on phone: Not on file    Gets together: Not on file    Attends religious service: Not on file    Active member of club or organization: Not on file    Attends meetings of clubs or organizations: Not on file    Relationship status: Not on file  Other Topics Concern  . Not on file  Social History Narrative   Married 1964   2 kids, in [place].  2 grandkids   Volunteered prev with [employer]   Enjoyed golf but gave it up after her husband had a stroke    Outpatient Encounter Medications as of 01/14/2018  Medication Sig  . cholecalciferol (VITAMIN D) 1000 units tablet Take 1,000 Units by mouth daily.  . colchicine 0.6 MG tablet Take 1 tablet (0.6 mg  total) by mouth 2 (two)  times daily as needed.  Marland Kitchen levothyroxine (SYNTHROID, LEVOTHROID) 200 MCG tablet TAKE 1 TABLET BY MOUTH ONCE DAILY BEFORE BREAKFAST  . metoprolol (TOPROL-XL) 200 MG 24 hr tablet TAKE 1 TABLET BY MOUTH IN THE MORNING  . Multiple Vitamin (MULTIVITAMIN WITH MINERALS) TABS tablet Take 1 tablet by mouth daily.  . Multiple Vitamins-Minerals (PRESERVISION AREDS 2) CAPS Take 1 capsule by mouth 2 (two) times daily.  . Omega 3 1000 MG CAPS Take 2,000 mg by mouth daily.  . Potassium 99 MG TABS Take 99 mg by mouth daily.  . pravastatin (PRAVACHOL) 40 MG tablet TAKE 1 TABLET BY MOUTH ONCE DAILY  . psyllium (REGULOID) 0.52 G capsule Take 0.52 g by mouth daily. Two tablets  per day  . TURMERIC PO Take 1 capsule by mouth.  . [DISCONTINUED] fluticasone (FLONASE) 50 MCG/ACT nasal spray Place 2 sprays into both nostrils as needed.  . [DISCONTINUED] nitrofurantoin, macrocrystal-monohydrate, (MACROBID) 100 MG capsule Take 1 capsule (  100 mg total) by mouth 2 (two) times daily.   No facility-administered encounter medications on file as of 01/14/2018.     Activities of Daily Living In your present state of health, do you have any difficulty performing the following activities: 01/14/2018  Hearing? N  Vision? Y  Comment age-related macular degeneration  Difficulty concentrating or making decisions? N  Walking or climbing stairs? N  Dressing or bathing? N  Doing errands, shopping? N  Preparing Food and eating ? N  Using the Toilet? N  In the past six months, have you accidently leaked urine? N  Do you have problems with loss of bowel control? N  Managing your Medications? N  Managing your Finances? N  Housekeeping or managing your Housekeeping? N  Some recent data might be hidden    Patient Care Team: Tonia Ghent, MD as PCP - General (Family Medicine) Leandrew Koyanagi, MD as Referring Physician (Ophthalmology) Ralene Bathe, MD as Referring Physician (Dermatology)    Assessment:   This is a routine wellness examination for Mellody.   Hearing Screening   125Hz  250Hz  500Hz  1000Hz  2000Hz  3000Hz  4000Hz  6000Hz  8000Hz   Right ear:   40 40 40  0    Left ear:   40 40 40  40    Vision Screening Comments: Vision exam in July 2019 @ [facility] and Dietary recommendations Current Exercise Habits: Home exercise routine, Type of exercise: Other - see comments;strength training/weights(elliptical, stationary bike, ), Time (Minutes): 60, Frequency (Times/Week): 4, Weekly Exercise (Minutes/Week): 240, Intensity: Moderate, Exercise limited by: None identified  Goals    . Weight < 200 lb (90.719 kg)     Target weight loss is 50  lbs. Starting 01/14/2018, I will continue to drink protein shakes for 2 meals and minimize intake of carbs during dinner. Daily intake of carbs does not exceed 20 grams.         Fall Risk Fall Risk  01/14/2018 12/27/2016 12/29/2015 12/23/2015 12/13/2014  Falls in the past year? No No No No No   Depression Screen PHQ 2/9 Scores 01/14/2018 12/27/2016 12/29/2015 12/23/2015  PHQ - 2 Score 0 0 0 0  PHQ- 9 Score 0 - - -     Cognitive Function MMSE - Mini Mental State Exam 01/14/2018 12/23/2015  Orientation to time 5 5  Orientation to Place 5 5  Registration 3 3  Attention/ Calculation 0 0  Recall 3 3  Language- name 2 objects 0 0  Language- repeat 1 1  Language- follow 3 step command 3 3  Language- read & follow direction 0 0  Write a sentence 0 0  Copy design 0 0  Total score 20 20     PLEASE NOTE: A Mini-Cog screen was completed. Maximum score is 20. A value of 0 denotes this part of Folstein MMSE was not completed or the patient failed this part of the Mini-Cog screening.   Mini-Cog Screening Orientation to Time - Max 5 pts Orientation to Place - Max 5 pts Registration - Max 3 pts Recall - Max 3 pts Language Repeat - Max 1 pts Language Follow 3 Step Command - Max 3 pts    Immunization History  Administered Date(s) Administered  . H1N1 07/08/2008  . Influenza Whole 05/23/2004  . Influenza,inj,Quad PF,6+ Mos 04/07/2014, 03/31/2015, 04/18/2016, 04/04/2017  . Pneumococcal Conjugate-13 12/13/2014  . Pneumococcal Polysaccharide-23 08/09/2010  . Rabies, IM 07/30/2017, 08/02/2017, 08/06/2017, 08/13/2017  . Td 07/02/1998, 07/08/2008  . Tdap 07/30/2017  .  Zoster 10/30/2010    Screening Tests Health Maintenance  Topic Date Due  . INFLUENZA VACCINE  01/30/2018  . MAMMOGRAM  04/16/2019  . COLONOSCOPY  03/13/2020  . TETANUS/TDAP  07/31/2027  . DEXA SCAN  Completed  . Hepatitis C Screening  Completed  . PNA vac Low Risk Adult  Completed       Plan:  I have personally reviewed,  addressed, and noted the following in the patient's chart:  A. Medical and social history B. Use of alcohol, tobacco or illicit drugs  C. Current medications and supplements D. Functional ability and status E.  Nutritional status F.  Physical activity G. Advance directives H. List of other physicians I.  Hospitalizations, surgeries, and ER visits in previous 12 months J.  [place] to include hearing, vision, cognitive, depression L. Referrals and appointments - none  In addition, I have reviewed and discussed with patient certain preventive protocols, quality metrics, and best practice recommendations. A written personalized care plan for preventive services as well as general preventive health recommendations were provided to patient.  See attached scanned questionnaire for additional information.   Signed,   Lindell Noe, MHA, BS, LPN Health Coach

## 2018-01-14 NOTE — Progress Notes (Signed)
PCP notes:   Health maintenance:  No gaps identified.  Abnormal screenings:   Hearing- failed  Hearing Screening   125Hz  250Hz  500Hz  1000Hz  2000Hz  3000Hz  4000Hz  6000Hz  8000Hz   Right ear:   40 40 40  0    Left ear:   40 40 40  40     Patient concerns:   None  Nurse concerns:  None  Next PCP appt:   01/20/18 @ 0945  I reviewed health advisor's note, was available for consultation on the day of service listed in this note, and agree with documentation and plan. Elsie Stain, MD.

## 2018-01-20 ENCOUNTER — Encounter: Payer: Self-pay | Admitting: Family Medicine

## 2018-01-20 ENCOUNTER — Ambulatory Visit (INDEPENDENT_AMBULATORY_CARE_PROVIDER_SITE_OTHER): Payer: PPO | Admitting: Family Medicine

## 2018-01-20 VITALS — BP 126/72 | HR 62 | Temp 97.8°F | Ht 65.0 in | Wt 219.0 lb

## 2018-01-20 DIAGNOSIS — H3530 Unspecified macular degeneration: Secondary | ICD-10-CM

## 2018-01-20 DIAGNOSIS — E785 Hyperlipidemia, unspecified: Secondary | ICD-10-CM | POA: Diagnosis not present

## 2018-01-20 DIAGNOSIS — E038 Other specified hypothyroidism: Secondary | ICD-10-CM | POA: Diagnosis not present

## 2018-01-20 DIAGNOSIS — R739 Hyperglycemia, unspecified: Secondary | ICD-10-CM | POA: Diagnosis not present

## 2018-01-20 DIAGNOSIS — M109 Gout, unspecified: Secondary | ICD-10-CM

## 2018-01-20 DIAGNOSIS — I10 Essential (primary) hypertension: Secondary | ICD-10-CM

## 2018-01-20 DIAGNOSIS — Z7189 Other specified counseling: Secondary | ICD-10-CM

## 2018-01-20 DIAGNOSIS — Z0000 Encounter for general adult medical examination without abnormal findings: Secondary | ICD-10-CM

## 2018-01-20 MED ORDER — PRAVASTATIN SODIUM 40 MG PO TABS: 40.0000 mg | ORAL_TABLET | Freq: Every day | ORAL | 3 refills | Status: DC

## 2018-01-20 MED ORDER — LEVOTHYROXINE SODIUM 200 MCG PO TABS: ORAL_TABLET | ORAL | 3 refills | Status: DC

## 2018-01-20 MED ORDER — COLCHICINE 0.6 MG PO TABS: ORAL_TABLET | ORAL | 3 refills | Status: DC

## 2018-01-20 MED ORDER — METOPROLOL SUCCINATE ER 200 MG PO TB24: 200.0000 mg | ORAL_TABLET | Freq: Every morning | ORAL | 3 refills | Status: DC

## 2018-01-20 NOTE — Progress Notes (Signed)
Hypertension:    Using medication without problems or lightheadedness: yes Chest pain with exertion:no Edema: some occ BLE edema, worse at the end of the day or if upright a lot.   Short of breath:no  Elevated Cholesterol: Using medications without problems: yes Muscle aches: likely not from statin Diet compliance:yes Exercise:yes Labs d/w pt.   Hypothyroidism.  No ADE on med.  Compliant. Labs d/w pt.  No neck mass.    Hyperuricema.  Labs d/w pt.  No recent gout flares.  She has prn colchicine, no recent use.    Macular degeneration per eye clinic.  I'll defer.  She agrees.    Tetanus 2019 zostavax 2012 PNA up to date.  Flu due in the fall Mammogram 2018 Reasonable to defer DXA at least until 2020.   D/w pt.  She agrees.   Colonoscopy 2018 Diet and exercise d/w pt.  Exercise is good.  Diet is healthy.   Husband designated if patient were incapacitated.   Hearing screening d/w pt.  Declined hearing aids.    PMH and SH reviewed  Meds, vitals, and allergies reviewed.   ROS: Per HPI unless specifically indicated in ROS section   GEN: nad, alert and oriented HEENT: mucous membranes moist NECK: supple w/o LA CV: rrr. PULM: ctab, no inc wob ABD: soft, +bs EXT: no edema SKIN: no acute rash

## 2018-01-20 NOTE — Patient Instructions (Signed)
Don't change your meds for now.  Take care.  Glad to see you.  Update me as needed.   Thanks for your effort.

## 2018-01-23 DIAGNOSIS — Z0000 Encounter for general adult medical examination without abnormal findings: Secondary | ICD-10-CM | POA: Insufficient documentation

## 2018-01-23 DIAGNOSIS — H3530 Unspecified macular degeneration: Secondary | ICD-10-CM | POA: Insufficient documentation

## 2018-01-23 NOTE — Assessment & Plan Note (Signed)
No ADE on med.  Compliant. Labs d/w pt.  No neck mass.  Continue as is.

## 2018-01-23 NOTE — Assessment & Plan Note (Signed)
Tetanus 2019 zostavax 2012 PNA up to date.  Flu due in the fall Mammogram 2018 Reasonable to defer DXA at least until 2020.   D/w pt.  She agrees.   Colonoscopy 2018 Diet and exercise d/w pt.  Exercise is good.  Diet is healthy.   Husband designated if patient were incapacitated.   Hearing screening d/w pt.  Declined hearing aids.

## 2018-01-23 NOTE — Assessment & Plan Note (Signed)
Labs discussed with patient.  Continue work on diet and exercise.

## 2018-01-23 NOTE — Assessment & Plan Note (Signed)
Labs discussed with patient.  Continue work on diet and exercise to try to help with triglycerides.  Would continue statin as is.  Update me as needed.  She agrees. >25 minutes spent in face to face time with patient, >50% spent in counselling or coordination of care.

## 2018-01-23 NOTE — Assessment & Plan Note (Signed)
Reasonable control.  No change in meds.  If her swelling is worse then she will let me know.  Update me as needed otherwise.  Continue work on diet and exercise.

## 2018-01-23 NOTE — Assessment & Plan Note (Signed)
MRI clinic.  I will defer.  She agrees.

## 2018-01-23 NOTE — Assessment & Plan Note (Signed)
Husband designated if patient were incapacitated. 

## 2018-01-23 NOTE — Assessment & Plan Note (Signed)
Labs d/w pt.  No recent gout flares.  She has prn colchicine, no recent use.  No change in meds.

## 2018-02-24 DIAGNOSIS — H353221 Exudative age-related macular degeneration, left eye, with active choroidal neovascularization: Secondary | ICD-10-CM | POA: Diagnosis not present

## 2018-03-31 DIAGNOSIS — H353221 Exudative age-related macular degeneration, left eye, with active choroidal neovascularization: Secondary | ICD-10-CM | POA: Diagnosis not present

## 2018-04-17 ENCOUNTER — Ambulatory Visit (INDEPENDENT_AMBULATORY_CARE_PROVIDER_SITE_OTHER): Payer: PPO

## 2018-04-17 DIAGNOSIS — Z23 Encounter for immunization: Secondary | ICD-10-CM | POA: Diagnosis not present

## 2018-04-21 DIAGNOSIS — Z1231 Encounter for screening mammogram for malignant neoplasm of breast: Secondary | ICD-10-CM | POA: Diagnosis not present

## 2018-04-21 LAB — HM MAMMOGRAPHY

## 2018-04-22 ENCOUNTER — Other Ambulatory Visit: Payer: Self-pay

## 2018-04-28 ENCOUNTER — Encounter: Payer: Self-pay | Admitting: Family Medicine

## 2018-05-06 DIAGNOSIS — H353221 Exudative age-related macular degeneration, left eye, with active choroidal neovascularization: Secondary | ICD-10-CM | POA: Diagnosis not present

## 2018-06-17 DIAGNOSIS — H353221 Exudative age-related macular degeneration, left eye, with active choroidal neovascularization: Secondary | ICD-10-CM | POA: Diagnosis not present

## 2018-07-03 ENCOUNTER — Telehealth: Payer: PPO | Admitting: Family

## 2018-07-03 DIAGNOSIS — N390 Urinary tract infection, site not specified: Secondary | ICD-10-CM

## 2018-07-03 MED ORDER — NITROFURANTOIN MONOHYD MACRO 100 MG PO CAPS: 100.0000 mg | ORAL_CAPSULE | Freq: Two times a day (BID) | ORAL | 0 refills | Status: DC

## 2018-07-03 NOTE — Progress Notes (Signed)

## 2018-07-10 ENCOUNTER — Ambulatory Visit (INDEPENDENT_AMBULATORY_CARE_PROVIDER_SITE_OTHER): Payer: PPO | Admitting: Family Medicine

## 2018-07-10 ENCOUNTER — Encounter: Payer: Self-pay | Admitting: Family Medicine

## 2018-07-10 VITALS — BP 122/68 | HR 91 | Temp 100.3°F | Ht 65.0 in | Wt 220.8 lb

## 2018-07-10 DIAGNOSIS — J101 Influenza due to other identified influenza virus with other respiratory manifestations: Secondary | ICD-10-CM | POA: Insufficient documentation

## 2018-07-10 DIAGNOSIS — R509 Fever, unspecified: Secondary | ICD-10-CM

## 2018-07-10 LAB — POC INFLUENZA A&B (BINAX/QUICKVUE)
INFLUENZA A, POC: POSITIVE — AB
INFLUENZA B, POC: NEGATIVE

## 2018-07-10 MED ORDER — BENZONATATE 200 MG PO CAPS: 200.0000 mg | ORAL_CAPSULE | Freq: Three times a day (TID) | ORAL | 1 refills | Status: DC | PRN

## 2018-07-10 MED ORDER — OSELTAMIVIR PHOSPHATE 75 MG PO CAPS: 75.0000 mg | ORAL_CAPSULE | Freq: Two times a day (BID) | ORAL | 0 refills | Status: DC

## 2018-07-10 NOTE — Progress Notes (Signed)
Subjective:    Patient ID: Mercedes [name], female    DOB: 10/09/1945, 73 y.o.   MRN: [id number]  HPI Here for cough/cong with fever   Non prod cough for 3 weeks  Other symptoms are new- 3-4 days  Cold with chills and then sweats -no temp at home ?  A little dizzy now and then Nasal congestion   Headache - back of her head  Temp: 100.3 F (37.9 C)  Cough is non productive   No known flu contacts   Ears-fine Throat -scratchy  No otc meds   Rapid flu test pos-faintly Results for orders placed or performed in visit on 07/10/18  POC Influenza A&B(BINAX/QUICKVUE)  Result Value Ref Range   Influenza A, POC Positive (A) Negative   Influenza B, POC Negative Negative     Patient Active Problem List   Diagnosis Date Noted  . Influenza A 07/10/2018  . Health care maintenance 01/23/2018  . Macular degeneration 01/23/2018  . Dysuria 12/08/2017  . Bruise 08/11/2017  . Back pain 12/29/2015  . Advance care planning 12/14/2014  . Vitamin D deficiency 12/14/2014  . Hyperglycemia 10/02/2012  . Medicare annual wellness visit, subsequent 10/02/2012  . Leg pain 07/09/2012  . Colon cancer screening 09/02/2011  . Osteoporosis screening 09/02/2011  . Obesity 11/08/2010  . Gout   . HLD (hyperlipidemia)   . Hypothyroidism 01/10/2007  . HYPERTENSION, BENIGN ESSENTIAL 01/10/2007  . DIVERTICULOSIS, COLON 01/10/2007  . MENOPAUSAL SYNDROME 01/10/2007  . OSTEOARTHRITIS 01/10/2007   Past Medical History:  Diagnosis Date  . Allergy   . Cataract    bilateral-not an issue right now  . Diverticulosis of colon   . Fatty liver   . GERD (gastroesophageal reflux disease)   . Gout   . 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 HLD (hyperlipidemia)    under control  . HTN (hypertension)   . Hypothyroidism   . Macular degeneration   . Osteoarthritis   . Rapid heartbeat    occasional, does not last long  . Tobacco abuse    Past Surgical History:  Procedure Laterality Date  . CATARACT  EXTRACTION    . CHOLECYSTECTOMY N/A 03/17/2014   Procedure: LAPAROSCOPIC CHOLECYSTECTOMY WITH INTRAOPERATIVE CHOLANGIOGRAM ;  Surgeon: Fanny Skates, MD;  Location: [facility];  Service: General;  Laterality: N/A;  . COLONOSCOPY     removed polyps  . SIGMOIDOSCOPY     Social History   Tobacco Use  . Smoking status: Former Smoker    Packs/day: 0.25    Years: 15.00    Pack years: 3.75    Types: Cigarettes    Last attempt to quit: 07/04/2010    Years since quitting: 8.0  . Smokeless tobacco: Never Used  . Tobacco comment: 5 pyh current smoker, 1/2 PPD, started ~18 YOA, quit 10 years, restarted ~1 year ago  Substance Use Topics  . Alcohol use: Yes    Alcohol/week: 5.0 standard drinks    Types: 5 Glasses of wine per week    Comment: Wine  . Drug use: No   Family History  Problem Relation Age of Onset  . Ulcers Father   . Angina Mother   . Cancer Brother        prostate  . Hypertension Brother   . Prostate cancer Brother   . Colon cancer Neg Hx   . Breast cancer Neg Hx   . Esophageal cancer Neg Hx   . Stomach cancer Neg Hx   . Rectal cancer  Neg Hx    Allergies  Allergen Reactions  . Ace Inhibitors     REACTION: cough  . Clarithromycin     REACTION: GI upset  . Fenofibrate     REACTION: itching- pt unsure  . Lipitor [Atorvastatin]     myalgias  . Niacin     "burning", made pt pass out  . Penicillins     REACTION: Purpura  . Sulfasalazine     REACTION: Purpura  . Tetracycline Hcl     REACTION: GI upset   Current Outpatient Medications on File Prior to Visit  Medication Sig Dispense Refill  . cholecalciferol (VITAMIN D) 1000 units tablet Take 1,000 Units by mouth daily.    . colchicine 0.6 MG tablet Take 1 tablet (0.6 mg  total) by mouth 2 (two)  times daily as needed. 60 tablet 3  . levothyroxine (SYNTHROID, LEVOTHROID) 200 MCG tablet TAKE 1 TABLET BY MOUTH ONCE DAILY BEFORE BREAKFAST 90 tablet 3  . metoprolol (TOPROL-XL) 200 MG 24 hr tablet Take 1 tablet (200 mg  total) by mouth every morning. 90 tablet 3  . Multiple Vitamin (MULTIVITAMIN WITH MINERALS) TABS tablet Take 1 tablet by mouth daily.    . Multiple Vitamins-Minerals (PRESERVISION AREDS 2) CAPS Take 1 capsule by mouth 2 (two) times daily.    . nitrofurantoin, macrocrystal-monohydrate, (MACROBID) 100 MG capsule Take 1 capsule (100 mg total) by mouth 2 (two) times daily. 10 capsule 0  . Omega 3 1000 MG CAPS Take 2,000 mg by mouth daily.    . pravastatin (PRAVACHOL) 40 MG tablet Take 1 tablet (40 mg total) by mouth daily. 90 tablet 3  . psyllium (REGULOID) 0.52 G capsule Take 0.52 g by mouth daily. Two tablets per day    . TURMERIC PO Take 1 capsule by mouth.     No current facility-administered medications on file prior to visit.       Review of Systems  Constitutional: Positive for activity change, appetite change, chills, diaphoresis, fatigue and fever.  HENT: Positive for congestion, postnasal drip, rhinorrhea, sinus pressure, sneezing and sore throat. Negative for ear pain.   Eyes: Negative for pain and discharge.  Respiratory: Positive for cough. Negative for chest tightness, shortness of breath, wheezing and stridor.   Cardiovascular: Negative for chest pain.  Gastrointestinal: Negative for diarrhea, nausea and vomiting.  Genitourinary: Negative for frequency, hematuria and urgency.  Musculoskeletal: Negative for arthralgias and myalgias.  Skin: Negative for rash.  Neurological: Positive for headaches. Negative for dizziness, weakness and light-headedness.  Psychiatric/Behavioral: Negative for confusion and dysphoric mood.       Objective:   Physical Exam Constitutional:      General: She is not in acute distress.    Appearance: Normal appearance. She is well-developed. She is obese. She is ill-appearing. She is not toxic-appearing or diaphoretic.  HENT:     Head: Normocephalic and atraumatic.     Comments: Nares are injected and congested      Right Ear: Tympanic membrane,  ear canal and external ear normal.     Left Ear: Tympanic membrane, ear canal and external ear normal.     Nose: Congestion and rhinorrhea present.     Comments: Nares are injected and congested      Mouth/Throat:     Mouth: Mucous membranes are moist.     Pharynx: Oropharynx is clear. No oropharyngeal exudate or posterior oropharyngeal erythema.     Comments: Clear pnd  Eyes:     General:  Right eye: No discharge.        Left eye: No discharge.     Conjunctiva/sclera: Conjunctivae normal.     Pupils: Pupils are equal, round, and reactive to light.  Neck:     Musculoskeletal: Normal range of motion and neck supple.  Cardiovascular:     Rate and Rhythm: Tachycardia present.     Heart sounds: Normal heart sounds.  Pulmonary:     Effort: Pulmonary effort is normal. No respiratory distress.     Breath sounds: Normal breath sounds. No stridor. No wheezing, rhonchi or rales.     Comments: Harsh bs  Good air exch No wheeze No rales or rhonchi Chest:     Chest wall: No tenderness.  Lymphadenopathy:     Cervical: No cervical adenopathy.  Skin:    General: Skin is warm and dry.     Capillary Refill: Capillary refill takes less than 2 seconds.     Findings: No rash.  Neurological:     Mental Status: She is alert.     Cranial Nerves: No cranial nerve deficit.  Psychiatric:        Mood and Affect: Mood normal.           Assessment & Plan:   Problem List Items Addressed This Visit      Respiratory   Influenza A - Primary    tamiflu px for 5 d  Fluids/rest  Tessalon for cough  guiafen-DM otc prn  Rest  Watch for wheezing Update if not starting to improve in a week or if worsening        Relevant Medications   oseltamivir (TAMIFLU) 75 MG capsule    Other Visit Diagnoses    Fever, unspecified fever cause       Relevant Orders   POC Influenza A&B(BINAX/QUICKVUE) (Completed)

## 2018-07-10 NOTE — Patient Instructions (Signed)
Fluids Rest  Tylenol as directed (as needed)  Tessalon for cough  Add mucinex dm for cough also   tamiflu- 5 days   Wear a mask when around people

## 2018-07-10 NOTE — Assessment & Plan Note (Signed)
tamiflu px for 5 d  Fluids/rest  Tessalon for cough  guiafen-DM otc prn  Rest  Watch for wheezing Update if not starting to improve in a week or if worsening

## 2018-07-28 DIAGNOSIS — H353221 Exudative age-related macular degeneration, left eye, with active choroidal neovascularization: Secondary | ICD-10-CM | POA: Diagnosis not present

## 2018-09-01 DIAGNOSIS — H353221 Exudative age-related macular degeneration, left eye, with active choroidal neovascularization: Secondary | ICD-10-CM | POA: Diagnosis not present

## 2018-10-13 ENCOUNTER — Telehealth: Payer: PPO | Admitting: Physician Assistant

## 2018-10-13 DIAGNOSIS — N3000 Acute cystitis without hematuria: Secondary | ICD-10-CM | POA: Diagnosis not present

## 2018-10-13 DIAGNOSIS — Z01812 Encounter for preprocedural laboratory examination: Secondary | ICD-10-CM | POA: Diagnosis not present

## 2018-10-13 DIAGNOSIS — Z20828 Contact with and (suspected) exposure to other viral communicable diseases: Secondary | ICD-10-CM | POA: Diagnosis not present

## 2018-10-13 DIAGNOSIS — H353221 Exudative age-related macular degeneration, left eye, with active choroidal neovascularization: Secondary | ICD-10-CM | POA: Diagnosis not present

## 2018-10-13 DIAGNOSIS — R7989 Other specified abnormal findings of blood chemistry: Secondary | ICD-10-CM | POA: Diagnosis not present

## 2018-10-13 MED ORDER — NITROFURANTOIN MONOHYD MACRO 100 MG PO CAPS: 100.0000 mg | ORAL_CAPSULE | Freq: Two times a day (BID) | ORAL | 0 refills | Status: AC

## 2018-10-13 NOTE — Progress Notes (Signed)

## 2018-11-25 DIAGNOSIS — H353221 Exudative age-related macular degeneration, left eye, with active choroidal neovascularization: Secondary | ICD-10-CM | POA: Diagnosis not present

## 2018-12-30 DIAGNOSIS — H353221 Exudative age-related macular degeneration, left eye, with active choroidal neovascularization: Secondary | ICD-10-CM | POA: Diagnosis not present

## 2019-01-11 ENCOUNTER — Other Ambulatory Visit: Payer: Self-pay | Admitting: Family Medicine

## 2019-01-11 DIAGNOSIS — E785 Hyperlipidemia, unspecified: Secondary | ICD-10-CM

## 2019-01-11 DIAGNOSIS — M109 Gout, unspecified: Secondary | ICD-10-CM

## 2019-01-11 DIAGNOSIS — E038 Other specified hypothyroidism: Secondary | ICD-10-CM

## 2019-01-11 DIAGNOSIS — E559 Vitamin D deficiency, unspecified: Secondary | ICD-10-CM

## 2019-01-15 ENCOUNTER — Other Ambulatory Visit: Payer: Self-pay

## 2019-01-15 ENCOUNTER — Other Ambulatory Visit (INDEPENDENT_AMBULATORY_CARE_PROVIDER_SITE_OTHER): Payer: PPO

## 2019-01-15 DIAGNOSIS — E038 Other specified hypothyroidism: Secondary | ICD-10-CM | POA: Diagnosis not present

## 2019-01-15 DIAGNOSIS — M109 Gout, unspecified: Secondary | ICD-10-CM

## 2019-01-15 DIAGNOSIS — E785 Hyperlipidemia, unspecified: Secondary | ICD-10-CM | POA: Diagnosis not present

## 2019-01-15 DIAGNOSIS — E559 Vitamin D deficiency, unspecified: Secondary | ICD-10-CM | POA: Diagnosis not present

## 2019-01-15 LAB — COMPREHENSIVE METABOLIC PANEL
ALT: 19 U/L (ref 0–35)
AST: 20 U/L (ref 0–37)
Albumin: 4.4 g/dL (ref 3.5–5.2)
Alkaline Phosphatase: 77 U/L (ref 39–117)
BUN: 21 mg/dL (ref 6–23)
CO2: 30 mEq/L (ref 19–32)
Calcium: 10.4 mg/dL (ref 8.4–10.5)
Chloride: 101 mEq/L (ref 96–112)
Creatinine, Ser: 0.85 mg/dL (ref 0.40–1.20)
GFR: 65.15 mL/min (ref >60.00)
Glucose, Bld: 138 mg/dL — ABNORMAL HIGH (ref 70–99)
Potassium: 4.4 mEq/L (ref 3.5–5.1)
Sodium: 140 mEq/L (ref 135–145)
Total Bilirubin: 1.1 mg/dL (ref 0.2–1.2)
Total Protein: 7.8 g/dL (ref 6.0–8.3)

## 2019-01-15 LAB — VITAMIN D 25 HYDROXY (VIT D DEFICIENCY, FRACTURES): VITD: 40.67 ng/mL (ref 30.00–100.00)

## 2019-01-15 LAB — LIPID PANEL
Cholesterol: 207 mg/dL — ABNORMAL HIGH (ref 0–200)
HDL: 48.9 mg/dL (ref >39.00)
LDL Cholesterol: 125 mg/dL — ABNORMAL HIGH (ref 0–99)
NonHDL: 158.28
Total CHOL/HDL Ratio: 4
Triglycerides: 165 mg/dL — ABNORMAL HIGH (ref 0.0–149.0)
VLDL: 33 mg/dL (ref 0.0–40.0)

## 2019-01-15 LAB — TSH: TSH: 14.05 u[IU]/mL — ABNORMAL HIGH (ref 0.35–4.50)

## 2019-01-15 LAB — URIC ACID: Uric Acid, Serum: 7.7 mg/dL — ABNORMAL HIGH (ref 2.4–7.0)

## 2019-01-22 ENCOUNTER — Ambulatory Visit: Payer: PPO

## 2019-01-22 ENCOUNTER — Other Ambulatory Visit: Payer: Self-pay

## 2019-01-22 ENCOUNTER — Ambulatory Visit (INDEPENDENT_AMBULATORY_CARE_PROVIDER_SITE_OTHER): Payer: PPO | Admitting: Family Medicine

## 2019-01-22 ENCOUNTER — Encounter: Payer: Self-pay | Admitting: Family Medicine

## 2019-01-22 VITALS — BP 132/88 | HR 112 | Temp 98.6°F | Ht 65.0 in | Wt 221.5 lb

## 2019-01-22 DIAGNOSIS — Z7189 Other specified counseling: Secondary | ICD-10-CM

## 2019-01-22 DIAGNOSIS — Z0000 Encounter for general adult medical examination without abnormal findings: Secondary | ICD-10-CM | POA: Diagnosis not present

## 2019-01-22 DIAGNOSIS — E038 Other specified hypothyroidism: Secondary | ICD-10-CM | POA: Diagnosis not present

## 2019-01-22 DIAGNOSIS — E785 Hyperlipidemia, unspecified: Secondary | ICD-10-CM

## 2019-01-22 DIAGNOSIS — R739 Hyperglycemia, unspecified: Secondary | ICD-10-CM | POA: Diagnosis not present

## 2019-01-22 DIAGNOSIS — I10 Essential (primary) hypertension: Secondary | ICD-10-CM

## 2019-01-22 LAB — HEMOGLOBIN A1C: Hgb A1c MFr Bld: 5.5 % (ref 4.6–6.5)

## 2019-01-22 LAB — TSH: TSH: 6.93 u[IU]/mL — ABNORMAL HIGH (ref 0.35–4.50)

## 2019-01-22 NOTE — Progress Notes (Signed)
I have personally reviewed the Medicare Annual Wellness questionnaire and have noted 1. The patient's medical and social history 2. Their use of alcohol, tobacco or illicit drugs 3. Their current medications and supplements 4. The patient's functional ability including ADL's, fall risks, home safety risks and hearing or visual             impairment. 5. Diet and physical activities 6. Evidence for depression or mood disorders  The patients weight, height, BMI have been recorded in the chart and visual acuity is per eye clinic.  I have made referrals, counseling and provided education to the patient based review of the above and I have provided the pt with a written personalized care plan for preventive services.  Provider list updated- see scanned forms.  Routine anticipatory guidance given to patient.  See health maintenance. The possibility exists that previously documented standard health maintenance information may have been brought forward from a previous encounter into this note.  If needed, that same information has been updated to reflect the current situation based on today's encounter.    Flu yearly Shingles discussed with patient. PNA up-to-date Tetanus 2019 Colonoscopy 2018 Breast cancer screening 2019 Bone density test deferred 2020 given pandemic. Advance directive- Husband designated if patient were incapacitated.  Cognitive function addressed- see scanned forms- and if abnormal then additional documentation follows.   Sugar elevation d/w pt. follow-up testing pending.  Hypothyroidism.  TSH elevation noted.  No neck mass. Compliant.  Follow-up TSH pending.  Elevated Cholesterol: Using medications without problems:yes Muscle aches: not from statin but has OA.   Diet compliance:yes Exercise:yes Labs d/w pt.    Hypertension:    Using medication without problems or lightheadedness: yes Chest pain with exertion:no Edema:occ mild.   Short of breath:no Labs d/w pt.     PMH and SH reviewed  Meds, vitals, and allergies reviewed.   ROS: Per HPI.  Unless specifically indicated otherwise in HPI, the patient denies:  General: fever Eyes: acute vision changes ENT: sore throat Cardiovascular: chest pain Respiratory: SOB GI: vomiting GU: dysuria Musculoskeletal: acute back pain Derm: acute rash Neuro: acute motor dysfunction Psych: worsening mood Endocrine: polydipsia Heme: bleeding Allergy: hayfever  GEN: nad, alert and oriented HEENT: ncat NECK: supple w/o LA CV: rrr. PULM: ctab, no inc wob ABD: soft, +bs EXT: no edema SKIN: no acute rash  Pulse approximately 90 on recheck.  Clearly below 100 and regular.

## 2019-01-22 NOTE — Patient Instructions (Addendum)
Check with your insurance to see if they will cover the shingrix shot.  Go to the lab on the way out.  We'll contact you with your lab report. I'll work on your refills after I see your labs.  Take care.  Glad to see you.

## 2019-01-26 MED ORDER — COLCHICINE 0.6 MG PO TABS: ORAL_TABLET | ORAL | 3 refills | Status: DC

## 2019-01-26 MED ORDER — LEVOTHYROXINE SODIUM 25 MCG PO TABS: 25.0000 ug | ORAL_TABLET | Freq: Every day | ORAL | 3 refills | Status: DC

## 2019-01-26 MED ORDER — LEVOTHYROXINE SODIUM 200 MCG PO TABS: ORAL_TABLET | ORAL | 3 refills | Status: DC

## 2019-01-26 MED ORDER — PRAVASTATIN SODIUM 40 MG PO TABS: 40.0000 mg | ORAL_TABLET | Freq: Every day | ORAL | 3 refills | Status: DC

## 2019-01-26 MED ORDER — METOPROLOL SUCCINATE ER 200 MG PO TB24: 200.0000 mg | ORAL_TABLET | Freq: Every morning | ORAL | 3 refills | Status: DC

## 2019-01-26 NOTE — Assessment & Plan Note (Signed)
Sugar elevation d/w pt. follow-up testing pending.

## 2019-01-26 NOTE — Assessment & Plan Note (Signed)
Continue work on diet and exercise.  No change in meds at this point.

## 2019-01-26 NOTE — Assessment & Plan Note (Signed)
Using medications without problems:yes Muscle aches: not from statin but has OA.   Diet compliance:yes Exercise:yes Labs d/w pt.   Continue as is.

## 2019-01-26 NOTE — Assessment & Plan Note (Signed)
TSH elevation noted.  No neck mass. Compliant.  Follow-up TSH pending.

## 2019-01-26 NOTE — Assessment & Plan Note (Signed)
Flu yearly Shingles discussed with patient. PNA up-to-date Tetanus 2019 Colonoscopy 2018 Breast cancer screening 2019 Bone density test deferred 2020 given pandemic. Advance directive- Husband designated if patient were incapacitated.  Cognitive function addressed- see scanned forms- and if abnormal then additional documentation follows.

## 2019-01-26 NOTE — Assessment & Plan Note (Signed)
Advance directive-Husband designated if patient were incapacitated.   

## 2019-02-03 DIAGNOSIS — H353221 Exudative age-related macular degeneration, left eye, with active choroidal neovascularization: Secondary | ICD-10-CM | POA: Diagnosis not present

## 2019-02-25 DIAGNOSIS — H353221 Exudative age-related macular degeneration, left eye, with active choroidal neovascularization: Secondary | ICD-10-CM | POA: Diagnosis not present

## 2019-03-04 DIAGNOSIS — Z1283 Encounter for screening for malignant neoplasm of skin: Secondary | ICD-10-CM | POA: Diagnosis not present

## 2019-03-04 DIAGNOSIS — D1800 Hemangioma unspecified site: Secondary | ICD-10-CM | POA: Diagnosis not present

## 2019-03-04 DIAGNOSIS — L814 Other melanin hyperpigmentation: Secondary | ICD-10-CM | POA: Diagnosis not present

## 2019-03-04 DIAGNOSIS — D179 Benign lipomatous neoplasm, unspecified: Secondary | ICD-10-CM | POA: Diagnosis not present

## 2019-03-04 DIAGNOSIS — D225 Melanocytic nevi of trunk: Secondary | ICD-10-CM | POA: Diagnosis not present

## 2019-03-04 DIAGNOSIS — L821 Other seborrheic keratosis: Secondary | ICD-10-CM | POA: Diagnosis not present

## 2019-03-04 DIAGNOSIS — L720 Epidermal cyst: Secondary | ICD-10-CM | POA: Diagnosis not present

## 2019-03-04 DIAGNOSIS — D229 Melanocytic nevi, unspecified: Secondary | ICD-10-CM | POA: Diagnosis not present

## 2019-03-04 DIAGNOSIS — L578 Other skin changes due to chronic exposure to nonionizing radiation: Secondary | ICD-10-CM | POA: Diagnosis not present

## 2019-03-12 ENCOUNTER — Ambulatory Visit (INDEPENDENT_AMBULATORY_CARE_PROVIDER_SITE_OTHER): Payer: PPO

## 2019-03-12 DIAGNOSIS — Z23 Encounter for immunization: Secondary | ICD-10-CM

## 2019-03-23 DIAGNOSIS — H353221 Exudative age-related macular degeneration, left eye, with active choroidal neovascularization: Secondary | ICD-10-CM | POA: Diagnosis not present

## 2019-03-23 DIAGNOSIS — H353112 Nonexudative age-related macular degeneration, right eye, intermediate dry stage: Secondary | ICD-10-CM | POA: Diagnosis not present

## 2019-03-31 ENCOUNTER — Other Ambulatory Visit: Payer: Self-pay

## 2019-03-31 ENCOUNTER — Other Ambulatory Visit (INDEPENDENT_AMBULATORY_CARE_PROVIDER_SITE_OTHER): Payer: PPO

## 2019-03-31 DIAGNOSIS — E038 Other specified hypothyroidism: Secondary | ICD-10-CM | POA: Diagnosis not present

## 2019-03-31 LAB — TSH: TSH: 0.13 u[IU]/mL — ABNORMAL LOW (ref 0.35–4.50)

## 2019-04-05 ENCOUNTER — Other Ambulatory Visit: Payer: Self-pay | Admitting: Family Medicine

## 2019-04-05 DIAGNOSIS — E038 Other specified hypothyroidism: Secondary | ICD-10-CM

## 2019-04-05 MED ORDER — LEVOTHYROXINE SODIUM 25 MCG PO TABS: 25.0000 ug | ORAL_TABLET | Freq: Every day | ORAL | Status: DC

## 2019-04-27 DIAGNOSIS — Z1231 Encounter for screening mammogram for malignant neoplasm of breast: Secondary | ICD-10-CM | POA: Diagnosis not present

## 2019-04-27 LAB — HM MAMMOGRAPHY

## 2019-04-28 DIAGNOSIS — H353221 Exudative age-related macular degeneration, left eye, with active choroidal neovascularization: Secondary | ICD-10-CM | POA: Diagnosis not present

## 2019-05-07 ENCOUNTER — Encounter: Payer: Self-pay | Admitting: Family Medicine

## 2019-06-02 DIAGNOSIS — H353221 Exudative age-related macular degeneration, left eye, with active choroidal neovascularization: Secondary | ICD-10-CM | POA: Diagnosis not present

## 2019-06-05 ENCOUNTER — Other Ambulatory Visit: Payer: Self-pay

## 2019-06-05 ENCOUNTER — Other Ambulatory Visit (INDEPENDENT_AMBULATORY_CARE_PROVIDER_SITE_OTHER): Payer: PPO

## 2019-06-05 DIAGNOSIS — E038 Other specified hypothyroidism: Secondary | ICD-10-CM

## 2019-06-05 LAB — TSH: TSH: 0.49 u[IU]/mL (ref 0.35–4.50)

## 2019-07-07 DIAGNOSIS — H353221 Exudative age-related macular degeneration, left eye, with active choroidal neovascularization: Secondary | ICD-10-CM | POA: Diagnosis not present

## 2019-07-23 ENCOUNTER — Telehealth: Payer: Self-pay

## 2019-07-23 DIAGNOSIS — E038 Other specified hypothyroidism: Secondary | ICD-10-CM

## 2019-07-23 NOTE — Telephone Encounter (Signed)
Please give the order.  No change in sig.  Make sure patient is aware and okay with this change.  Recheck TSH 2 months after the substitution.  Thanks.  TSH ordered.

## 2019-07-23 NOTE — Telephone Encounter (Signed)
Fax came from [facility] in [place] [phone]) asking if it is ok to change manufacturer on Levothyroxine medication. Please review

## 2019-07-24 NOTE — Telephone Encounter (Signed)
Patient notified as instructed by telephone and verbalized understanding. Patient stated that she will call back and schedule lab appointment once she switches over to the new manufacturer. Called and gave the order to the pharmacist as instructed.

## 2019-08-11 DIAGNOSIS — H353221 Exudative age-related macular degeneration, left eye, with active choroidal neovascularization: Secondary | ICD-10-CM | POA: Diagnosis not present

## 2019-09-01 ENCOUNTER — Ambulatory Visit (INDEPENDENT_AMBULATORY_CARE_PROVIDER_SITE_OTHER): Payer: PPO | Admitting: Family Medicine

## 2019-09-01 ENCOUNTER — Encounter: Payer: Self-pay | Admitting: Family Medicine

## 2019-09-01 ENCOUNTER — Other Ambulatory Visit: Payer: PPO

## 2019-09-01 VITALS — BP 143/90 | HR 77 | Temp 97.8°F | Ht 65.0 in

## 2019-09-01 DIAGNOSIS — R350 Frequency of micturition: Secondary | ICD-10-CM | POA: Diagnosis not present

## 2019-09-01 DIAGNOSIS — N3001 Acute cystitis with hematuria: Secondary | ICD-10-CM

## 2019-09-01 LAB — POC URINALSYSI DIPSTICK (AUTOMATED)
Bilirubin, UA: NEGATIVE
Glucose, UA: NEGATIVE
Ketones, UA: NEGATIVE
Protein, UA: POSITIVE — AB
Spec Grav, UA: 1.02 (ref 1.010–1.025)
Urobilinogen, UA: NEGATIVE E.U./dL — AB
pH, UA: 6 (ref 5.0–8.0)

## 2019-09-01 MED ORDER — CEPHALEXIN 500 MG PO CAPS: 500.0000 mg | ORAL_CAPSULE | Freq: Three times a day (TID) | ORAL | 0 refills | Status: DC

## 2019-09-01 NOTE — Assessment & Plan Note (Signed)
Typically uncomplicated UTI. Treat with 7 days of cephalexin given allergy history.  Increase fluids.  Send for urine culture.

## 2019-09-01 NOTE — Progress Notes (Addendum)
Virtual Visit via Video Note  I connected with Mercedes [name] on 09/01/2019 by a video enabled telemedicine application and verified that I am speaking with the correct person using two identifiers.  Location: Patient: home Provider: office   I discussed the limitations of evaluation and management by telemedicine and the availability of in person appointments. The patient expressed understanding and agreed to proceed.     I discussed the assessment and treatment plan with the patient. The patient was provided an opportunity to ask questions and all were answered. The patient agreed with the plan and demonstrated an understanding of the instructions.   The patient was advised to call back or seek an in-person evaluation if the symptoms worsen or if the condition fails to improve as anticipated.    Chief Complaint  Patient presents with  . Urinary Frequency    History of Present Illness: Urinary Frequency  This is a new problem. The current episode started in the past 7 days ( 2 days ago). The problem has been gradually worsening. Quality: no dysuria, but heaviness lower abdomen. The pain is mild. There has been no fever. She is not sexually active. Associated symptoms include frequency and urgency. Pertinent negatives include no chills, discharge, flank pain, hematuria, hesitancy, nausea, possible pregnancy or vomiting. She has tried increased fluids for the symptoms. The treatment provided no relief. There is no history of catheterization, kidney stones, recurrent UTIs, a single kidney or a urological procedure.    No UTI in last year. PCN and sulfa allergies.. possible rash associated.  This visit occurred during the SARS-CoV-2 public health emergency.  Safety protocols were in place, including screening questions prior to the visit, additional usage of staff PPE, and extensive cleaning of exam room while observing appropriate contact time as indicated for disinfecting solutions.    COVID 19 screen:  No recent travel or known exposure to COVID19 The patient denies respiratory symptoms of COVID 19 at this time. The importance of social distancing was discussed today.     Review of Systems  Constitutional: Negative for chills.  Gastrointestinal: Negative for nausea and vomiting.  Genitourinary: Positive for frequency and urgency. Negative for flank pain, hematuria and hesitancy.      Past Medical History:  Diagnosis Date  . Allergy   . Cataract    bilateral-not an issue right now  . Diverticulosis of colon   . Fatty liver   . GERD (gastroesophageal reflux disease)   . Gout   . 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 HLD (hyperlipidemia)    under control  . HTN (hypertension)   . Hypothyroidism   . Macular degeneration   . Osteoarthritis   . Rapid heartbeat    occasional, does not last long  . Tobacco abuse     reports that she quit smoking about 9 years ago. Her smoking use included cigarettes. She has a 3.75 pack-year smoking history. She has never used smokeless tobacco. She reports current alcohol use. She reports that she does not use drugs.   Current Outpatient Medications:  .  cholecalciferol (VITAMIN D) 1000 units tablet, Take 1,000 Units by mouth 2 (two) times a day. , Disp: , Rfl:  .  colchicine 0.6 MG tablet, Take 1 tablet (0.6 mg  total) by mouth 2 (two)  times daily as needed., Disp: 60 tablet, Rfl: 3 .  levothyroxine (SYNTHROID) 200 MCG tablet, TAKE 1 TABLET BY MOUTH ONCE DAILY BEFORE BREAKFAST, Disp: 90 tablet, Rfl: 3 .  levothyroxine (  SYNTHROID) 25 MCG tablet, Take 1 tablet (25 mcg total) by mouth daily before breakfast. On Monday through Fridays.  Skip on Saturday and Sunday., Disp: , Rfl:  .  Magnesium 250 MG TABS, Take 250 mg by mouth at bedtime., Disp: , Rfl:  .  metoprolol (TOPROL-XL) 200 MG 24 hr tablet, Take 1 tablet (200 mg total) by mouth every morning., Disp: 90 tablet, Rfl: 3 .  Multiple Vitamin (MULTIVITAMIN WITH MINERALS) TABS  tablet, Take 1 tablet by mouth daily., Disp: , Rfl:  .  Multiple Vitamins-Minerals (PRESERVISION AREDS 2) CAPS, Take 1 capsule by mouth 2 (two) times daily., Disp: , Rfl:  .  naproxen sodium (ALEVE) 220 MG tablet, Take 220 mg by mouth in the morning and at bedtime., Disp: , Rfl:  .  Omega 3 1000 MG CAPS, Take 2,000 mg by mouth daily., Disp: , Rfl:  .  Potassium 99 MG TABS, Take 99 mg by mouth every morning., Disp: , Rfl:  .  pravastatin (PRAVACHOL) 40 MG tablet, Take 1 tablet (40 mg total) by mouth daily., Disp: 90 tablet, Rfl: 3 .  psyllium (REGULOID) 0.52 G capsule, Take 0.52 g by mouth daily. Two tablets per day, Disp: , Rfl:  .  TURMERIC PO, Take 1 capsule by mouth., Disp: , Rfl:  .  zinc gluconate 50 MG tablet, Take 50 mg by mouth daily., Disp: , Rfl:    Observations/Objective: Blood pressure (!) 143/90, pulse 77, temperature 97.8 F (36.6 C), temperature source Oral, height 5\' 5"  (1.651 m).  Physical Exam  Physical Exam Constitutional:      General: The patient is not in acute distress. Pulmonary:     Effort: Pulmonary effort is normal. No respiratory distress.  Neurological:     Mental Status: The patient is alert and oriented to person, place, and time.  Psychiatric:        Mood and Affect: Mood normal.        Behavior: Behavior normal.   Assessment and Plan   Acute cystitis with hematuria Typically uncomplicated UTI. Treat with 7 days of cephalexin given allergy history.  Increase fluids.  Send for urine culture.     Eliezer Lofts, MD

## 2019-09-03 LAB — URINE CULTURE

## 2019-09-22 DIAGNOSIS — H353221 Exudative age-related macular degeneration, left eye, with active choroidal neovascularization: Secondary | ICD-10-CM | POA: Diagnosis not present

## 2019-10-27 DIAGNOSIS — H353221 Exudative age-related macular degeneration, left eye, with active choroidal neovascularization: Secondary | ICD-10-CM | POA: Diagnosis not present

## 2019-12-01 ENCOUNTER — Other Ambulatory Visit: Payer: Self-pay

## 2019-12-01 ENCOUNTER — Other Ambulatory Visit: Payer: Self-pay | Admitting: Family Medicine

## 2019-12-01 ENCOUNTER — Other Ambulatory Visit (INDEPENDENT_AMBULATORY_CARE_PROVIDER_SITE_OTHER): Payer: PPO

## 2019-12-01 ENCOUNTER — Telehealth: Payer: Self-pay

## 2019-12-01 DIAGNOSIS — R300 Dysuria: Secondary | ICD-10-CM | POA: Diagnosis not present

## 2019-12-01 LAB — POC URINALSYSI DIPSTICK (AUTOMATED)
Ketones, UA: NEGATIVE
Spec Grav, UA: >=1.03 — AB (ref 1.010–1.025)
pH, UA: 5 (ref 5.0–8.0)

## 2019-12-01 MED ORDER — NITROFURANTOIN MONOHYD MACRO 100 MG PO CAPS: 100.0000 mg | ORAL_CAPSULE | Freq: Two times a day (BID) | ORAL | 0 refills | Status: DC

## 2019-12-01 NOTE — Telephone Encounter (Signed)
I spoke with pt; pt started on 11/28/19 with burning upon urination and frequency and urgency. Pt said she has not had any abd pain.  Pt has burning and itching perineal area at vagina if not taking AZO; pt does not have vaginal discharge(pt said these are usual symptoms for her when she has UTI. No back pain.pt did OTC urine ck that indicated UTI; pt then started AZO on 11/30/19. and now urine is dark orange. Pt has no covid symptoms, no travel and no known exposure to + covid. Pt will bring urine specimen to front drop off area and will call when arrives so someone can come out and get specimen. Pt will get abx tonight. ED precautions given and pt voiced understanding. FYI to Dr Damita Dunnings and Terri Skains CMA.

## 2019-12-01 NOTE — Telephone Encounter (Signed)
Noted. Thanks.  Will await the f/u testing.

## 2019-12-01 NOTE — Telephone Encounter (Signed)
If she can't come by to give a urine sample today, then collect a sterile urine sample tonight.  She can refrigerate it and bring it in tomorrow.  I put in the ucx and u/a orders.    Please triage patient in the meantime and let me know about her situation, if other pertinent details arise. Please given her routine ER cautions.  I sent the rx in the meantime.  Have her collect urine prior to starting abx.  Thanks.

## 2019-12-01 NOTE — Telephone Encounter (Signed)
Patient contacted the office and states she believes she has a UTI. She states that she is having burning, lower abdominal pressure, and urinary frequency. Patient states that she would like a medication sent in to [facility] in [place], as she is on her way in from out of town, and is not able to make an appt today in time before our office closes. Please advise.

## 2019-12-01 NOTE — Telephone Encounter (Signed)
Urine sent for micro and culture as instructed from Dr. Damita Dunnings.

## 2019-12-01 NOTE — Addendum Note (Signed)
Addended by: Cloyd Stagers on: 12/01/2019 04:30 PM   Modules accepted: Orders

## 2019-12-02 LAB — URINALYSIS, ROUTINE W REFLEX MICROSCOPIC
Nitrite: POSITIVE — AB
Specific Gravity, Urine: 1.025 (ref 1.000–1.030)
Total Protein, Urine: >=300 — AB
Urine Glucose: 100 — AB
Urobilinogen, UA: >=8 — AB (ref 0.0–1.0)
pH: 5 (ref 5.0–8.0)

## 2019-12-02 LAB — URINE CULTURE
MICRO NUMBER:: 10538710
SPECIMEN QUALITY:: ADEQUATE

## 2019-12-07 ENCOUNTER — Telehealth: Payer: Self-pay | Admitting: Family Medicine

## 2019-12-07 NOTE — Telephone Encounter (Signed)
Thanks for the update.  Glad she is better.  I would finish the rx, drink plenty of fluids and let us know if she is not improving/doing well as the week goes along.

## 2019-12-07 NOTE — Telephone Encounter (Signed)
Patient received Dr.Duncan's message about her urine culture.  Dr.Duncan asked patient to call office and let him know how she's doing.  Patient said she's better.  She's still having a drawing sensation when she urinates.Patient has one pill left for tonight. Patient uses [facility].

## 2019-12-07 NOTE — Telephone Encounter (Signed)
Patient advised.

## 2019-12-08 DIAGNOSIS — H353221 Exudative age-related macular degeneration, left eye, with active choroidal neovascularization: Secondary | ICD-10-CM | POA: Diagnosis not present

## 2020-01-12 DIAGNOSIS — H353221 Exudative age-related macular degeneration, left eye, with active choroidal neovascularization: Secondary | ICD-10-CM | POA: Diagnosis not present

## 2020-01-25 ENCOUNTER — Ambulatory Visit: Payer: PPO

## 2020-01-28 ENCOUNTER — Telehealth: Payer: Self-pay

## 2020-01-28 ENCOUNTER — Ambulatory Visit (INDEPENDENT_AMBULATORY_CARE_PROVIDER_SITE_OTHER): Payer: PPO | Admitting: Family Medicine

## 2020-01-28 ENCOUNTER — Ambulatory Visit: Payer: PPO

## 2020-01-28 ENCOUNTER — Encounter: Payer: Self-pay | Admitting: Family Medicine

## 2020-01-28 ENCOUNTER — Ambulatory Visit (INDEPENDENT_AMBULATORY_CARE_PROVIDER_SITE_OTHER)
Admission: RE | Admit: 2020-01-28 | Discharge: 2020-01-28 | Disposition: A | Discharge status: Home / Self Care | Payer: PPO | Source: Ambulatory Visit | Attending: Family Medicine | Admitting: Family Medicine

## 2020-01-28 ENCOUNTER — Other Ambulatory Visit: Payer: Self-pay

## 2020-01-28 VITALS — BP 160/76 | HR 102 | Temp 96.9°F | Ht 64.0 in | Wt 221.3 lb

## 2020-01-28 DIAGNOSIS — E785 Hyperlipidemia, unspecified: Secondary | ICD-10-CM | POA: Diagnosis not present

## 2020-01-28 DIAGNOSIS — I10 Essential (primary) hypertension: Secondary | ICD-10-CM | POA: Diagnosis not present

## 2020-01-28 DIAGNOSIS — R739 Hyperglycemia, unspecified: Secondary | ICD-10-CM

## 2020-01-28 DIAGNOSIS — R0789 Other chest pain: Secondary | ICD-10-CM

## 2020-01-28 DIAGNOSIS — E559 Vitamin D deficiency, unspecified: Secondary | ICD-10-CM

## 2020-01-28 DIAGNOSIS — M109 Gout, unspecified: Secondary | ICD-10-CM

## 2020-01-28 DIAGNOSIS — E038 Other specified hypothyroidism: Secondary | ICD-10-CM

## 2020-01-28 DIAGNOSIS — Z0000 Encounter for general adult medical examination without abnormal findings: Secondary | ICD-10-CM

## 2020-01-28 DIAGNOSIS — Z7189 Other specified counseling: Secondary | ICD-10-CM

## 2020-01-28 LAB — LIPID PANEL
Cholesterol: 179 mg/dL (ref 0–200)
HDL: 42.4 mg/dL (ref >39.00)
NonHDL: 136.89
Total CHOL/HDL Ratio: 4
Triglycerides: 330 mg/dL — ABNORMAL HIGH (ref 0.0–149.0)
VLDL: 66 mg/dL — ABNORMAL HIGH (ref 0.0–40.0)

## 2020-01-28 LAB — LDL CHOLESTEROL, DIRECT: Direct LDL: 102 mg/dL

## 2020-01-28 LAB — COMPREHENSIVE METABOLIC PANEL
ALT: 25 U/L (ref 0–35)
AST: 25 U/L (ref 0–37)
Albumin: 4.2 g/dL (ref 3.5–5.2)
Alkaline Phosphatase: 69 U/L (ref 39–117)
BUN: 24 mg/dL — ABNORMAL HIGH (ref 6–23)
CO2: 32 mEq/L (ref 19–32)
Calcium: 10.9 mg/dL — ABNORMAL HIGH (ref 8.4–10.5)
Chloride: 103 mEq/L (ref 96–112)
Creatinine, Ser: 0.8 mg/dL (ref 0.40–1.20)
GFR: 69.67 mL/min (ref >60.00)
Glucose, Bld: 119 mg/dL — ABNORMAL HIGH (ref 70–99)
Potassium: 5 mEq/L (ref 3.5–5.1)
Sodium: 140 mEq/L (ref 135–145)
Total Bilirubin: 1.3 mg/dL — ABNORMAL HIGH (ref 0.2–1.2)
Total Protein: 7.4 g/dL (ref 6.0–8.3)

## 2020-01-28 LAB — HEMOGLOBIN A1C: Hgb A1c MFr Bld: 5.1 % (ref 4.6–6.5)

## 2020-01-28 LAB — VITAMIN D 25 HYDROXY (VIT D DEFICIENCY, FRACTURES): VITD: 64.69 ng/mL (ref 30.00–100.00)

## 2020-01-28 LAB — TSH: TSH: 0.73 u[IU]/mL (ref 0.35–4.50)

## 2020-01-28 LAB — URIC ACID: Uric Acid, Serum: 8.3 mg/dL — ABNORMAL HIGH (ref 2.4–7.0)

## 2020-01-28 MED ORDER — TIZANIDINE HCL 2 MG PO CAPS: 2.0000 mg | ORAL_CAPSULE | Freq: Three times a day (TID) | ORAL | 1 refills | Status: DC

## 2020-01-28 NOTE — Progress Notes (Signed)
This visit occurred during the SARS-CoV-2 public health emergency.  Safety protocols were in place, including screening questions prior to the visit, additional usage of staff PPE, and extensive cleaning of exam room while observing appropriate contact time as indicated for disinfecting solutions.  Reproduceable intermittent and variable R sided chest wall pain over the last 2 weeks.  No trauma no falls.  Noted with movement but not exertional.  No pain with deep breath.  Not sore to the touch.  No rash.  No change in skin sensation.  Taking aleve at baseline.  Pain is better with exercise but worse with turning or leaning.    She occasionally can hear her pulse in her left ear.  She did know if wax could be causing the problem.  Hypertension:    Using medication without problems or lightheadedness: yes Chest pain with exertion: no, see above.  Edema:no Short of breath: occ from pain described above.   Labs pending.  Still on metoprolol.  Elevated Cholesterol: Using medications without problems: yes Muscle aches: no Diet compliance: yes Exercise: yes Taking pravastatin  No gout flares, d/w pt.  Labs pending.    History of hyperglycemia.  See notes on labs.  History of vitamin D deficiency.  See notes on labs.  Hypothyroidism on levothyroxine at baseline.  Compliant.  See notes on labs.  No neck mass.  No dysphagia.  She is seeing eye clinic about macular degeneration.    Flu yearly Shingles discussed with patient. PNA up-to-date Tetanus 2019 covid vaccine 2021 Colonoscopy 2018 Breast cancer screening 2020 Bone density test deferred 2021 given pandemic. Advance directive- Husband designated if patient were incapacitated.  Meds, vitals, and allergies reviewed.   ROS: Per HPI unless specifically indicated in ROS section   GEN: nad, alert and oriented HEENT: ncat, tympanic membranes within normal limits bilaterally.  Scant wax in the left canal that does not need evacuation.  (Discussed with patient.  No intervention done today.  She will update me as needed.) NECK: supple w/o LA CV: rrr.  no murmur PULM: ctab, no inc wob, right anterior lower ribs tender to palpation.  The right upper quadrant of the abdomen is not tender itself.  No rash.  No bruise. ABD: soft, +bs EXT: no edema SKIN: no acute rash

## 2020-01-28 NOTE — Telephone Encounter (Signed)
Called patient 3 times trying to complete her Medicare visit. Patient never answered. Left message on voicemail notifying patient that appointment will be cancelled and to call the office to reschedule at her convenience.

## 2020-01-28 NOTE — Patient Instructions (Addendum)
Go to the lab on the way out.   If you have mychart we'll likely use that to update you.     Let me know if your BP isn't better when your pain is better.    Use tizanidine if needed for chest wall pain.   Take care.  Glad to see you.

## 2020-01-31 ENCOUNTER — Other Ambulatory Visit: Payer: Self-pay | Admitting: Family Medicine

## 2020-01-31 DIAGNOSIS — R0789 Other chest pain: Secondary | ICD-10-CM | POA: Insufficient documentation

## 2020-01-31 MED ORDER — PRAVASTATIN SODIUM 40 MG PO TABS: 40.0000 mg | ORAL_TABLET | Freq: Every day | ORAL | 3 refills | Status: DC

## 2020-01-31 MED ORDER — LEVOTHYROXINE SODIUM 200 MCG PO TABS: ORAL_TABLET | ORAL | 3 refills | Status: DC

## 2020-01-31 MED ORDER — METOPROLOL SUCCINATE ER 200 MG PO TB24: 200.0000 mg | ORAL_TABLET | Freq: Every morning | ORAL | 3 refills | Status: DC

## 2020-01-31 MED ORDER — LEVOTHYROXINE SODIUM 25 MCG PO TABS: 25.0000 ug | ORAL_TABLET | Freq: Every day | ORAL | 3 refills | Status: DC

## 2020-01-31 NOTE — Assessment & Plan Note (Signed)
Continue pravastatin.  Continue work on diet and exercise.  See notes on labs.

## 2020-01-31 NOTE — Assessment & Plan Note (Signed)
Advance directive-Husband designated if patient were incapacitated.   

## 2020-01-31 NOTE — Assessment & Plan Note (Signed)
See notes on labs.  Continue levothyroxine as is.

## 2020-01-31 NOTE — Assessment & Plan Note (Signed)
Flu yearly Shingles discussed with patient. PNA up-to-date Tetanus 2019 covid vaccine 2021 Colonoscopy 2018 Breast cancer screening 2020 Bone density test deferred 2021 given pandemic. Advance directive- Husband designated if patient were incapacitated.

## 2020-01-31 NOTE — Assessment & Plan Note (Signed)
No recent flares.  See notes on labs.

## 2020-01-31 NOTE — Assessment & Plan Note (Signed)
Continue metoprolol.  Continue work on diet and exercise.  See notes on labs.

## 2020-01-31 NOTE — Assessment & Plan Note (Signed)
See notes on imaging.  Unlikely to be significant pathology found on chest x-ray but reasonable to check given her discomfort.  We talked about occult rib fractures.  Symptomatic care for now.  See notes on imaging.  Okay for outpatient follow-up.

## 2020-02-11 ENCOUNTER — Other Ambulatory Visit: Payer: Self-pay

## 2020-02-11 ENCOUNTER — Other Ambulatory Visit (INDEPENDENT_AMBULATORY_CARE_PROVIDER_SITE_OTHER): Payer: PPO

## 2020-02-11 DIAGNOSIS — E038 Other specified hypothyroidism: Secondary | ICD-10-CM

## 2020-02-11 LAB — TSH: TSH: 2.24 u[IU]/mL (ref 0.35–4.50)

## 2020-02-11 LAB — BASIC METABOLIC PANEL
BUN: 21 mg/dL (ref 6–23)
CO2: 33 mEq/L — ABNORMAL HIGH (ref 19–32)
Calcium: 10.9 mg/dL — ABNORMAL HIGH (ref 8.4–10.5)
Chloride: 101 mEq/L (ref 96–112)
Creatinine, Ser: 0.88 mg/dL (ref 0.40–1.20)
GFR: 62.41 mL/min (ref >60.00)
Glucose, Bld: 143 mg/dL — ABNORMAL HIGH (ref 70–99)
Potassium: 4.8 mEq/L (ref 3.5–5.1)
Sodium: 141 mEq/L (ref 135–145)

## 2020-02-16 DIAGNOSIS — H353221 Exudative age-related macular degeneration, left eye, with active choroidal neovascularization: Secondary | ICD-10-CM | POA: Diagnosis not present

## 2020-02-19 ENCOUNTER — Other Ambulatory Visit: Payer: Self-pay | Admitting: Family Medicine

## 2020-02-22 ENCOUNTER — Other Ambulatory Visit (INDEPENDENT_AMBULATORY_CARE_PROVIDER_SITE_OTHER): Payer: PPO

## 2020-02-22 ENCOUNTER — Other Ambulatory Visit: Payer: Self-pay

## 2020-02-23 LAB — PTH, INTACT AND CALCIUM
Calcium: 9.5 mg/dL (ref 8.6–10.4)
PTH: 98 pg/mL — ABNORMAL HIGH (ref 14–64)

## 2020-02-26 ENCOUNTER — Other Ambulatory Visit: Payer: Self-pay | Admitting: Family Medicine

## 2020-02-26 DIAGNOSIS — E213 Hyperparathyroidism, unspecified: Secondary | ICD-10-CM

## 2020-02-29 ENCOUNTER — Telehealth: Payer: Self-pay | Admitting: Family Medicine

## 2020-02-29 NOTE — Telephone Encounter (Signed)
Spoke with patient, she is wanting to have further conversation with Dr Damita Dunnings regarding her results. She does not feel like she understands 100% why she is being referred to a Surgical office and not to Endocrinology.   Pt does not want to proceed at this time until she discusses further with Dr Damita Dunnings.

## 2020-02-29 NOTE — Telephone Encounter (Signed)
Can we set up a phone visit?

## 2020-03-02 NOTE — Telephone Encounter (Signed)
Called and got patient scheduled for a phone visit this Friday.

## 2020-03-04 ENCOUNTER — Other Ambulatory Visit: Payer: Self-pay

## 2020-03-04 ENCOUNTER — Telehealth: Payer: PPO | Admitting: Family Medicine

## 2020-03-04 ENCOUNTER — Encounter: Payer: Self-pay | Admitting: Family Medicine

## 2020-03-04 NOTE — Progress Notes (Addendum)
Virtual visit completed through WebEx or similar program Patient location: home  Provider location: [facility], office  Participants: Patient and me (unless stated otherwise below)  Pandemic considerations d/w pt.   Limitations and rationale for visit method d/w patient.  Patient agreed to proceed.   CC: Hypercalcemia.  HPI:  Calcium and PTH d/w pt.  Path/phys d/w pt.  I don't have expectation of ominous dx but I would like input from Dr. Celine Ahr.  Rationale for referral discussed with patient  She had some muscle soreness/cramping in her legs after starting back and the gym.   Tizanidine helps.  She doesn't have alarming sx o/w.   Meds and allergies reviewed.   ROS: Per HPI unless specifically indicated in ROS section   NAD Speech wnl  A/P:  Hypercalcemia. Calcium and PTH d/w pt.  Path/phys d/w pt.  I don't have expectation of ominous dx but I would like input from Dr. Celine Ahr.  Rationale for referral discussed with patient.  I will await consult note.  Greatly appreciate help from Dr. Celine Ahr.  She had some muscle soreness/cramping in her legs after starting back and the gym.   Tizanidine helps.  She doesn't have alarming sx o/w.   No charge for visit.

## 2020-03-07 NOTE — Assessment & Plan Note (Signed)
  Calcium and PTH d/w pt.  Path/phys d/w pt.  I don't have expectation of ominous dx but I would like input from Dr. Celine Ahr.  Rationale for referral discussed with patient.  I will await consult note.  Greatly appreciate help from Dr. Celine Ahr.  She had some muscle soreness/cramping in her legs after starting back and the gym.   Tizanidine helps.  She doesn't have alarming sx o/w.

## 2020-03-10 ENCOUNTER — Telehealth: Payer: Self-pay

## 2020-03-10 ENCOUNTER — Other Ambulatory Visit: Payer: Self-pay

## 2020-03-10 ENCOUNTER — Ambulatory Visit (INDEPENDENT_AMBULATORY_CARE_PROVIDER_SITE_OTHER): Payer: PPO | Admitting: General Surgery

## 2020-03-10 ENCOUNTER — Encounter: Payer: Self-pay | Admitting: General Surgery

## 2020-03-10 VITALS — BP 187/77 | HR 89 | Temp 98.7°F | Ht 64.0 in | Wt 219.0 lb

## 2020-03-10 DIAGNOSIS — N2581 Secondary hyperparathyroidism of renal origin: Secondary | ICD-10-CM | POA: Diagnosis not present

## 2020-03-10 DIAGNOSIS — E210 Primary hyperparathyroidism: Secondary | ICD-10-CM

## 2020-03-10 NOTE — Telephone Encounter (Signed)
Spoke with the patient and let her know that her Bone Density test was at the [facility] on 04/13/20 at 10:20 am. She is aware of date, time, and instructions.

## 2020-03-10 NOTE — Progress Notes (Signed)
Patient ID: Mercedes [name], female   DOB: 10/09/1945, 74 y.o.   MRN: [id number]  Chief Complaint  Patient presents with  . New Patient (Initial Visit)    Hyperparathyroidism    HPI Mercedes [name] is a 74 y.o. female.  She has been referred by her primary care provider, Dr. Damita Dunnings, for further evaluation of hypercalcemia and hyperparathyroidism.  Upon review of her electronic medical record, her calcium levels have been hovering at the upper limit of normal to slightly elevated for a number of years.  In 2015, an intact PTH was checked and was inappropriately normal at 47.1 with a concomitant serum calcium of 10.6.  More recently, her calcium was 10.9 on February 11, 2020.  This was repeated with a concomitant intact PTH on February 22, 2020.  At that time, her calcium was 9.5 and her intact PTH was clearly elevated at 98.  She has had low vitamin D levels in the past, but this has corrected with an oral daily supplement.  She has never taken any thiazide diuretics and does not use Tums on a regular basis.  She was on calcium supplements in the past, but discontinued these with the advent of her hypercalcemia.  Her last bone density study was in 2016.  This demonstrated osteopenia with a T score of -1.7 in the femoral neck.  She has not taken any agents for bone anabolism, such as Prolia or a bisphosphonate.  She does have hypothyroidism and takes 225 mcg of levothyroxine daily.  She denies any history of nephrolithiasis.  She has never had pancreatitis.  She does endorse some sleep disturbance, waking at night for approximately an hour, but then is able to get back to sleep.  She reports having a dry mouth and feels like she drinks a substantial amount of fluid to combat this.  As a result, she also feels like she urinates more than would be typical.  She denies any significant nocturia.  She denies any issues with her memory and feels like her thinking is clear.  She has never had a pathologic fracture.   She denies long bone pain, but does endorse arthritic joint pain.  She denies constipation, but states that she takes Metamucil daily for management of gout.  She reports that her brother had difficulty with kidney stones but she is not aware of any other family members who have had elevated calcium, other tumors associated with endocrine neoplastic syndromes or any jaw tumors.  She denies any occupational or therapeutic exposure to ionizing radiation.   Past Medical History:  Diagnosis Date  . Allergy   . Cataract    bilateral-not an issue right now  . Diverticulosis of colon   . Fatty liver   . GERD (gastroesophageal reflux disease)   . Gout   . 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 HLD (hyperlipidemia)    under control  . HTN (hypertension)   . Hypothyroidism   . Macular degeneration   . Osteoarthritis   . Rapid heartbeat    occasional, does not last long  . Tobacco abuse     Past Surgical History:  Procedure Laterality Date  . CATARACT EXTRACTION    . CHOLECYSTECTOMY N/A 03/17/2014   Procedure: LAPAROSCOPIC CHOLECYSTECTOMY WITH INTRAOPERATIVE CHOLANGIOGRAM ;  Surgeon: Fanny Skates, MD;  Location: [facility];  Service: General;  Laterality: N/A;  . COLONOSCOPY     removed polyps  . SIGMOIDOSCOPY      Family History  Problem Relation Age  of Onset  . Ulcers Father   . Angina Mother   . Hypertension Brother   . Prostate cancer Brother   . Colon cancer Neg Hx   . Breast cancer Neg Hx   . Esophageal cancer Neg Hx   . Stomach cancer Neg Hx   . Rectal cancer Neg Hx     Social History Social History   Tobacco Use  . Smoking status: Former Smoker    Packs/day: 0.25    Years: 15.00    Pack years: 3.75    Types: Cigarettes    Quit date: 07/04/2010    Years since quitting: 9.6  . Smokeless tobacco: Never Used  Vaping Use  . Vaping Use: Never used  Substance Use Topics  . Alcohol use: Yes    Comment: Wine, 1 per day  . Drug use: No    Allergies  Allergen Reactions  .  Ace Inhibitors     REACTION: cough  . Clarithromycin     REACTION: GI upset  . Fenofibrate     REACTION: itching- pt unsure  . Lipitor [Atorvastatin]     myalgias  . Niacin     "burning", made pt pass out  . Penicillins     REACTION: Purpura  . Sulfasalazine     REACTION: Purpura  . Tetracycline Hcl     REACTION: GI upset    Current Outpatient Medications  Medication Sig Dispense Refill  . cholecalciferol (VITAMIN D) 1000 units tablet Take 1,000 Units by mouth 2 (two) times a day.     . colchicine 0.6 MG tablet Take 1 tablet (0.6 mg  total) by mouth 2 (two)  times daily as needed. 60 tablet 3  . levothyroxine (SYNTHROID) 200 MCG tablet TAKE 1 TABLET BY MOUTH ONCE DAILY BEFORE BREAKFAST 90 tablet 3  . levothyroxine (SYNTHROID) 25 MCG tablet Take 1 tablet (25 mcg total) by mouth daily before breakfast. On Monday through Fridays.  Skip on Saturday and Sunday. 90 tablet 3  . metoprolol (TOPROL-XL) 200 MG 24 hr tablet Take 1 tablet (200 mg total) by mouth every morning. 90 tablet 3  . Multiple Vitamin (MULTIVITAMIN WITH MINERALS) TABS tablet Take 1 tablet by mouth daily.    . Multiple Vitamins-Minerals (PRESERVISION AREDS 2) CAPS Take 1 capsule by mouth 2 (two) times daily.    . naproxen sodium (ALEVE) 220 MG tablet Take 220 mg by mouth in the morning and at bedtime.    . Omega 3 1000 MG CAPS Take 2,000 mg by mouth daily.    . Potassium 99 MG TABS Take 99 mg by mouth every morning.    . pravastatin (PRAVACHOL) 40 MG tablet Take 1 tablet (40 mg total) by mouth daily. 90 tablet 3  . psyllium (REGULOID) 0.52 G capsule Take 0.52 g by mouth daily. Two tablets per day    . tizanidine (ZANAFLEX) 2 MG capsule Take 1 capsule (2 mg total) by mouth 3 (three) times daily. Sedation caution. 30 capsule 1  . TURMERIC PO Take 1 capsule by mouth.    . zinc gluconate 50 MG tablet Take 50 mg by mouth daily.     No current facility-administered medications for this visit.    Review of Systems Review  of Systems  HENT: Positive for hearing loss.   Eyes:       Macular degeneration  All other systems reviewed and are negative. Are as discussed in the history of present illness.  Blood pressure (!) 187/77, pulse 89, temperature  98.7 F (37.1 C), height 5' 4"  (1.626 m), weight 219 lb (99.3 kg), SpO2 98 %. Body mass index is 37.59 kg/m.  Physical Exam Physical Exam Constitutional:      General: She is not in acute distress.    Appearance: Normal appearance. She is obese.  HENT:     Head: Normocephalic and atraumatic.     Nose:     Comments: Covered with a mask    Mouth/Throat:     Comments: Covered with a mask Eyes:     General: No scleral icterus.       Right eye: No discharge.        Left eye: No discharge.  Neck:     Comments: The trachea is midline.  There is no palpable cervical or supraclavicular lymphadenopathy.  No palpable thyroid masses or thyromegaly appreciated.  The gland moves freely with deglutition. Cardiovascular:     Rate and Rhythm: Normal rate and regular rhythm.     Pulses: Normal pulses.  Pulmonary:     Effort: Pulmonary effort is normal.     Breath sounds: Normal breath sounds.  Abdominal:     General: Bowel sounds are normal.     Palpations: Abdomen is soft.  Genitourinary:    Comments: Deferred Musculoskeletal:     Right lower leg: No edema.     Left lower leg: No edema.  Skin:    General: Skin is warm and dry.  Neurological:     General: No focal deficit present.     Mental Status: She is alert and oriented to person, place, and time.  Psychiatric:        Mood and Affect: Mood normal.        Behavior: Behavior normal.     Data Reviewed Via the electronic medical record, I reviewed the report of her bone densitometry obtained December 16, 2014.  The results are discussed in the history of present illness.  Results for Mercedes, [name] (MRN [id number]) as of 03/10/2020 12:37  Ref. Range 08/07/2010 08:43 08/07/2010 23:01 08/31/2011 10:58 09/24/2012  08:16 11/24/2013 08:25 12/01/2013 09:54 01/04/2014 08:27 03/16/2014 10:40 12/02/2014 08:19 12/23/2015 08:54 12/24/2016 09:03 01/14/2018 13:06 01/15/2019 07:35 01/28/2020 10:53 02/11/2020 08:37 02/22/2020 08:31  Calcium Latest Ref Range: 8.6 - 10.4 mg/dL 9.9  9.6 10.0 10.7 (H) 10.6 (H) 9.5 10.3 10.4 9.6 10.1 10.7 (H) 10.4 10.9 (H) 10.9 (H) 9.5  Creatinine Latest Ref Range: 0.40 - 1.20 mg/dL 0.9  0.9 0.9 0.8   0.75 0.84 0.83 0.83 0.88 0.85 0.80 0.88   GFR, Est Non African American Latest Ref Range: >90 mL/min        84 (L)          Alkaline Phosphatase Latest Ref Range: 39 - 117 U/L 67  70 74 56   75 72 54 60 64 77 69    Vitamin D, 25-Hydroxy Latest Ref Range: 30 - 89 ng/mL  35    61            PTH, Intact Latest Ref Range: 14 - 64 pg/mL      47.1          98 (H)   These labs show calcium levels that have been persistently at the upper limit of normal or above normal for many years.  In the past, her vitamin D level was low, but it responded appropriately to supplementation.  Her renal function is normal.  Her intact PTH is elevated and is inappropriate for the  level of hypercalcemia.  Alkaline phosphatase has been normal.   Assessment This is a 74 year old woman with hypercalcemia and elevated intact PTH, most consistent with primary hyperparathyroidism.  She is relatively asymptomatic.  Today, she and I discussed what specifically hyperparathyroidism is, its long-term effects on various organ systems, including the vasculature, the kidneys, and, likely most important in her case, bone mineral density.  We also discussed the indications for surgery and asymptomatic primary hyperparathyroidism.  These include age less than 62, calcium greater than 1 mg/dL above the upper limit of normal, decreased GFR, osteoporosis (T score less than -2.5) or pathologic fracture, urinary calcium greater than 400 mg/dL in a 24-hour period, radiographic imaging of nephrocalcinosis or asymptomatic nephrolithiasis.  I also discussed with her  that there are other potential causes for hypercalcemia, including malignancy such as multiple myeloma and benign familial hypocalciuric hypercalcemia.  Plan As it has been a number of years since her last bone density study, we will obtain a DEXA scan to include the distal one third of the radius in the nondominant forearm.  We will also obtain a 24-hour urine for calcium and creatinine.  We will get a concomitant basic metabolic panel in order to calculate the fractional excretion of calcium.  I have also ordered an SPEP to rule out multiple myeloma.  If any of these studies reveal an indication for surgery in this asymptomatic patient, we will discuss that further.  I also mentioned to her that sometimes correction of hypercalcemia may improve blood pressure control.  If an indication for surgery is identified, we will proceed with a localization study to help guide our surgical management.  She expressed concern about delays due to the COVID-19 pandemic, should she require surgery.  She was reassured that the average patient with primary hyperparathyroidism is hypercalcemic for 8 years before being diagnosed and subsequently treated.  As parathyroidectomy is nearly always an outpatient operation, we should not be limited by the current restrictions on elective surgery.The risks of parathyroid surgery were discussed with the patient, including (but not limited to): bleeding, infection, damage to surrounding structures/tissues, injury (temporary or permanent) to the recurrent laryngeal nerve, hypocalcemia (temporary or permanent), need to take calcium and/or vitamin D supplementation, recurrent hyperparathyroidism, failure to correct hyperparathyroidism, need for additional surgery.  The patient had the opportunity to ask any questions and these were answered to their satisfaction.  We will await the results of her laboratory testing and bone density study and progress from there.    Fredirick Maudlin 03/10/2020, 12:19 PM

## 2020-03-10 NOTE — Patient Instructions (Addendum)
We will have you complete some lab work.  This will be done at [facility], [facility] entrance. Do Not get blood work done until you turn in your 24 hour urine.  We will call you with your results.    What is primary hyperparathyroidism? Primary hyperparathyroidism is a disorder of the parathyroid glands, four pea-sized glands located on or near the thyroid gland in the neck. "Primary" means this disorder begins in the parathyroid glands, rather than resulting from another health problem such as kidney failure. In primary hyperparathyroidism, one or more of the parathyroid glands is overactive. As a result, the gland makes too much parathyroid hormone (PTH).  Too much PTH causes calcium levels in your blood to rise too high, which can lead to health problems such as bone thinning and kidney stones. Doctors usually catch primary hyperparathyroidism early through routine blood tests, before serious problems occur.

## 2020-03-14 ENCOUNTER — Other Ambulatory Visit
Admission: RE | Admit: 2020-03-14 | Discharge: 2020-03-14 | Disposition: A | Discharge status: Home / Self Care | Payer: PPO | Attending: General Surgery | Admitting: General Surgery

## 2020-03-14 DIAGNOSIS — E210 Primary hyperparathyroidism: Secondary | ICD-10-CM | POA: Diagnosis not present

## 2020-03-14 DIAGNOSIS — N2581 Secondary hyperparathyroidism of renal origin: Secondary | ICD-10-CM | POA: Diagnosis not present

## 2020-03-14 LAB — BASIC METABOLIC PANEL
Anion gap: 10 (ref 5–15)
BUN: 24 mg/dL — ABNORMAL HIGH (ref 8–23)
CO2: 27 mmol/L (ref 22–32)
Calcium: 10.3 mg/dL (ref 8.9–10.3)
Chloride: 102 mmol/L (ref 98–111)
Creatinine, Ser: 0.96 mg/dL (ref 0.44–1.00)
GFR calc Af Amer: >60 mL/min (ref >60)
GFR calc non Af Amer: 57 mL/min — ABNORMAL LOW (ref >60)
Glucose, Bld: 130 mg/dL — ABNORMAL HIGH (ref 70–99)
Potassium: 4.1 mmol/L (ref 3.5–5.1)
Sodium: 139 mmol/L (ref 135–145)

## 2020-03-14 LAB — CREATININE, URINE, 24 HOUR
Collection Interval-UCRE24: 24 hours
Creatinine, 24H Ur: 1176 mg/d (ref 600–1800)
Creatinine, Urine: 56 mg/dL
Urine Total Volume-UCRE24: 2100 mL

## 2020-03-15 LAB — PROTEIN ELECTROPHORESIS, SERUM
A/G Ratio: 1.1 (ref 0.7–1.7)
Albumin ELP: 3.9 g/dL (ref 2.9–4.4)
Alpha-1-Globulin: 0.2 g/dL (ref 0.0–0.4)
Alpha-2-Globulin: 0.8 g/dL (ref 0.4–1.0)
Beta Globulin: 1.2 g/dL (ref 0.7–1.3)
Gamma Globulin: 1.3 g/dL (ref 0.4–1.8)
Globulin, Total: 3.5 g/dL (ref 2.2–3.9)
Total Protein ELP: 7.4 g/dL (ref 6.0–8.5)

## 2020-03-15 LAB — CALCIUM, URINE, 24 HOUR
Calcium, 24 hour urine: 290 mg/24 hr — ABNORMAL HIGH (ref 26–354)
Calcium, Ur: 13.8 mg/dL
Total Volume: 2100

## 2020-03-15 LAB — CALCIUM, IONIZED: Calcium, Ionized, Serum: 5.7 mg/dL — ABNORMAL HIGH (ref 4.5–5.6)

## 2020-03-17 ENCOUNTER — Telehealth: Payer: Self-pay | Admitting: General Surgery

## 2020-03-17 NOTE — Telephone Encounter (Signed)
Called patient to go over lab results. No answer. LM on mobile VM for her to return my call.

## 2020-03-17 NOTE — Telephone Encounter (Signed)
I spoke with the patient about her lab results.  Benign familial hypocalciuric hypercalcemia and multiple myeloma has been ruled out.  She continues to have high normal calcium and elevated ionized calcium.  Her GFR is slightly decreased, at 57.  Looking back through her chart, it appears she has had multiple prior tests within the last year where he and her GFR was normal.  She is scheduled for bone densitometry on October 13.  She technically does meet criteria for surgery with a decreased GFR, but this is an isolated finding.  I will await the results of her bone density testing, which may drive [place] to pursue surgery on those results alone.  However, if she does not have significantly decreased bone mineral density, then I will recheck her basic metabolic panel in a couple of months to monitor her GFR.  If it persists below 60, then I think we have an argument to pursue operative intervention.

## 2020-03-17 NOTE — Telephone Encounter (Signed)
Noted. Thanks.

## 2020-03-22 DIAGNOSIS — H353221 Exudative age-related macular degeneration, left eye, with active choroidal neovascularization: Secondary | ICD-10-CM | POA: Diagnosis not present

## 2020-03-26 ENCOUNTER — Other Ambulatory Visit: Payer: Self-pay

## 2020-03-26 ENCOUNTER — Ambulatory Visit (INDEPENDENT_AMBULATORY_CARE_PROVIDER_SITE_OTHER): Payer: PPO

## 2020-03-26 DIAGNOSIS — Z23 Encounter for immunization: Secondary | ICD-10-CM | POA: Diagnosis not present

## 2020-03-28 ENCOUNTER — Other Ambulatory Visit: Payer: Self-pay | Admitting: Family Medicine

## 2020-04-04 ENCOUNTER — Other Ambulatory Visit: Payer: PPO

## 2020-04-13 ENCOUNTER — Other Ambulatory Visit: Payer: Self-pay

## 2020-04-13 ENCOUNTER — Ambulatory Visit
Admission: RE | Admit: 2020-04-13 | Discharge: 2020-04-13 | Disposition: A | Discharge status: Home / Self Care | Payer: PPO | Source: Ambulatory Visit | Attending: General Surgery | Admitting: General Surgery

## 2020-04-13 DIAGNOSIS — E210 Primary hyperparathyroidism: Secondary | ICD-10-CM | POA: Insufficient documentation

## 2020-04-13 DIAGNOSIS — E559 Vitamin D deficiency, unspecified: Secondary | ICD-10-CM | POA: Diagnosis not present

## 2020-04-13 DIAGNOSIS — M85852 Other specified disorders of bone density and structure, left thigh: Secondary | ICD-10-CM | POA: Diagnosis not present

## 2020-04-13 DIAGNOSIS — E039 Hypothyroidism, unspecified: Secondary | ICD-10-CM | POA: Diagnosis not present

## 2020-04-13 DIAGNOSIS — Z780 Asymptomatic menopausal state: Secondary | ICD-10-CM | POA: Diagnosis not present

## 2020-04-14 ENCOUNTER — Telehealth: Payer: Self-pay | Admitting: General Surgery

## 2020-04-14 NOTE — Telephone Encounter (Signed)
I spoke with the patient regarding her recent bone mineral density study.  Her T score at the femoral neck was -1.8, only slightly worse than her prior examination, at which it was -1.7.  Nondominant forearm was -0.9.  At this point, she does not meet criteria for parathyroid surgery in an asymptomatic patient, aside from a mild decrease in her GFR.  As the GFR of 57 appears to be isolated from prior normal values, we should recheck a basic metabolic panel in a couple of months.  If the decreased GFR persists, then I think we need to discuss further whether or not she may benefit from parathyroidectomy.

## 2020-04-15 NOTE — Telephone Encounter (Signed)
Lab appt scheduled in mid December.

## 2020-04-15 NOTE — Addendum Note (Signed)
Addended by: Tonia Ghent on: 04/15/2020 08:12 AM   Modules accepted: Orders

## 2020-04-15 NOTE — Telephone Encounter (Signed)
I put in the f/u orders.  FYI to Dr. Celine Ahr, with appreciation.  Lugene- please schedule patient in about 2 months.  Thanks.

## 2020-04-25 ENCOUNTER — Ambulatory Visit: Payer: PPO | Attending: Internal Medicine

## 2020-04-25 DIAGNOSIS — Z23 Encounter for immunization: Secondary | ICD-10-CM

## 2020-04-25 NOTE — Progress Notes (Signed)
   Covid-19 Vaccination Clinic  Name:  Mercedes [name]    MRN: [id number] DOB: 1946/04/01  04/25/2020  [name] was observed post Covid-19 immunization for 15 minutes without incident. She was provided with Vaccine Information Sheet and instruction to access the V-Safe system.   [name] was instructed to call 911 with any severe reactions post vaccine: Marland Kitchen Difficulty breathing  . Swelling of face and throat  . A fast heartbeat  . A bad rash all over body  . Dizziness and weakness

## 2020-04-26 DIAGNOSIS — H353112 Nonexudative age-related macular degeneration, right eye, intermediate dry stage: Secondary | ICD-10-CM | POA: Diagnosis not present

## 2020-04-26 DIAGNOSIS — H353221 Exudative age-related macular degeneration, left eye, with active choroidal neovascularization: Secondary | ICD-10-CM | POA: Diagnosis not present

## 2020-05-31 DIAGNOSIS — H353221 Exudative age-related macular degeneration, left eye, with active choroidal neovascularization: Secondary | ICD-10-CM | POA: Diagnosis not present

## 2020-06-13 ENCOUNTER — Encounter: Payer: Self-pay | Admitting: Family Medicine

## 2020-06-13 DIAGNOSIS — Z1231 Encounter for screening mammogram for malignant neoplasm of breast: Secondary | ICD-10-CM | POA: Diagnosis not present

## 2020-06-15 ENCOUNTER — Other Ambulatory Visit: Payer: Self-pay

## 2020-06-15 ENCOUNTER — Other Ambulatory Visit (INDEPENDENT_AMBULATORY_CARE_PROVIDER_SITE_OTHER): Payer: PPO

## 2020-06-15 DIAGNOSIS — N2581 Secondary hyperparathyroidism of renal origin: Secondary | ICD-10-CM

## 2020-06-15 LAB — BASIC METABOLIC PANEL
BUN: 22 mg/dL (ref 6–23)
CO2: 28 mEq/L (ref 19–32)
Calcium: 10.3 mg/dL (ref 8.4–10.5)
Chloride: 103 mEq/L (ref 96–112)
Creatinine, Ser: 0.77 mg/dL (ref 0.40–1.20)
GFR: 74.77 mL/min (ref >60.00)
Glucose, Bld: 108 mg/dL — ABNORMAL HIGH (ref 70–99)
Potassium: 4.4 mEq/L (ref 3.5–5.1)
Sodium: 139 mEq/L (ref 135–145)

## 2020-06-15 NOTE — Addendum Note (Signed)
Addended by: Cloyd Stagers on: 06/15/2020 09:05 AM   Modules accepted: Orders

## 2020-06-17 ENCOUNTER — Ambulatory Visit: Payer: PPO | Admitting: Family Medicine

## 2020-06-17 ENCOUNTER — Other Ambulatory Visit: Payer: Self-pay

## 2020-06-17 ENCOUNTER — Ambulatory Visit (INDEPENDENT_AMBULATORY_CARE_PROVIDER_SITE_OTHER): Payer: PPO | Admitting: Family Medicine

## 2020-06-17 ENCOUNTER — Encounter: Payer: Self-pay | Admitting: Family Medicine

## 2020-06-17 VITALS — BP 150/90 | HR 77 | Temp 96.6°F | Ht 64.0 in | Wt 222.0 lb

## 2020-06-17 DIAGNOSIS — M545 Low back pain, unspecified: Secondary | ICD-10-CM | POA: Diagnosis not present

## 2020-06-17 DIAGNOSIS — R300 Dysuria: Secondary | ICD-10-CM | POA: Diagnosis not present

## 2020-06-17 DIAGNOSIS — I10 Essential (primary) hypertension: Secondary | ICD-10-CM

## 2020-06-17 DIAGNOSIS — R109 Unspecified abdominal pain: Secondary | ICD-10-CM | POA: Diagnosis not present

## 2020-06-17 LAB — POCT URINALYSIS DIP (MANUAL ENTRY)
Bilirubin, UA: NEGATIVE
Glucose, UA: NEGATIVE mg/dL
Nitrite, UA: NEGATIVE
Protein Ur, POC: 100 mg/dL — AB
Spec Grav, UA: >=1.03 — AB (ref 1.010–1.025)
Urobilinogen, UA: 0.2 E.U./dL
pH, UA: 5.5 (ref 5.0–8.0)

## 2020-06-17 LAB — PROTEIN ELECTROPHORESIS, SERUM
Albumin ELP: 4.3 g/dL (ref 3.8–4.8)
Alpha 1: 0.3 g/dL (ref 0.2–0.3)
Alpha 2: 0.8 g/dL (ref 0.5–0.9)
Beta 2: 0.4 g/dL (ref 0.2–0.5)
Beta Globulin: 0.5 g/dL (ref 0.4–0.6)
Gamma Globulin: 1.3 g/dL (ref 0.8–1.7)
Total Protein: 7.6 g/dL (ref 6.1–8.1)

## 2020-06-17 MED ORDER — NITROFURANTOIN MONOHYD MACRO 100 MG PO CAPS: 100.0000 mg | ORAL_CAPSULE | Freq: Two times a day (BID) | ORAL | 0 refills | Status: DC

## 2020-06-17 NOTE — Patient Instructions (Addendum)
I'll update Dr. Celine Ahr.  We'll call about PT.   Drink plenty of water and start the antibiotics today.  We'll contact you with your lab report.  Take care.    Recheck BP at home when well and let me know if persistently >140/>90.

## 2020-06-17 NOTE — Progress Notes (Signed)
This visit occurred during the SARS-CoV-2 public health emergency.  Safety protocols were in place, including screening questions prior to the visit, additional usage of staff PPE, and extensive cleaning of exam room while observing appropriate contact time as indicated for disinfecting solutions.  Dysuria: no urgency but her urine didn't look normal, looked cloudy.  She noted some blood on toilet paper.   duration of symptoms: likely ongoing, since last event.   abdominal pain: some abd pain fevers:no back pain: at baseline.  Vomiting:no ucx pending.    She has more back pain, progressively worse.  In B lower back and legs.  Pain walking initially and that affects her gait.  After getting up and moving some, it gets some better.  "By lunchtime I"m in pretty good shape."  Sometimes she has no pain but sometimes she has pain lifting in front, ie washing dishes or making the bed.  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  Aleve helps.  nsaid cautions d/w pt.    Elevated calcium d/w pt.  Recent labs d/w pt.  Prev DXA d/w pt. I will update and ask for input from Dr. Celine Ahr.  Blood pressure elevation discussed with patient.  See after visit summary.  Meds, vitals, and allergies reviewed.   Per HPI unless specifically indicated in ROS section   GEN: nad, alert and oriented HEENT: ncat NECK: supple CV: rrr.  PULM: ctab, no inc wob ABD: soft, +bs, suprapubic area not tender EXT: no edema SKIN: no acute rash BACK: no CVA pain  B SI not ttp but that is the painful area per patient report. SLR neg B  30 minutes were devoted to patient care in this encounter (this includes time spent reviewing the patient's file/history, interviewing and examining the patient, counseling/reviewing plan with patient).

## 2020-06-19 LAB — URINE CULTURE
MICRO NUMBER:: 11332128
SPECIMEN QUALITY:: ADEQUATE

## 2020-06-19 NOTE — Assessment & Plan Note (Signed)
Discussed options.  Reasonable to see physical therapy.  Referral placed.  She agrees.  She will update me as needed.

## 2020-06-19 NOTE — Assessment & Plan Note (Signed)
Presumed cystitis.  Nontoxic.  Urine culture pending.  Urinalysis discussed.  Start Macrobid.  Routine cautions given to patient.

## 2020-06-19 NOTE — Assessment & Plan Note (Signed)
Discussed with patient.  Calcium normalized on most recent check and GFR/creatinine improved.  I will ask for input from Dr. Celine Ahr.  I greatly appreciate her help.  I suspect it would be reasonable to recheck her labs periodically but I would like input from Dr. Celine Ahr on her preferred/advised interval.

## 2020-06-19 NOTE — Assessment & Plan Note (Signed)
She can update me if her blood pressure is persistently elevated at home.

## 2020-06-22 ENCOUNTER — Other Ambulatory Visit: Payer: Self-pay | Admitting: Family Medicine

## 2020-06-22 DIAGNOSIS — I10 Essential (primary) hypertension: Secondary | ICD-10-CM

## 2020-06-22 DIAGNOSIS — E559 Vitamin D deficiency, unspecified: Secondary | ICD-10-CM

## 2020-06-22 DIAGNOSIS — M109 Gout, unspecified: Secondary | ICD-10-CM

## 2020-06-22 DIAGNOSIS — R739 Hyperglycemia, unspecified: Secondary | ICD-10-CM

## 2020-06-23 ENCOUNTER — Other Ambulatory Visit: Payer: Self-pay | Admitting: Family Medicine

## 2020-06-28 ENCOUNTER — Other Ambulatory Visit: Payer: Self-pay | Admitting: Family Medicine

## 2020-06-28 ENCOUNTER — Other Ambulatory Visit: Payer: Self-pay

## 2020-06-28 DIAGNOSIS — H353221 Exudative age-related macular degeneration, left eye, with active choroidal neovascularization: Secondary | ICD-10-CM | POA: Diagnosis not present

## 2020-06-28 MED ORDER — CIPROFLOXACIN HCL 250 MG PO TABS: 250.0000 mg | ORAL_TABLET | Freq: Two times a day (BID) | ORAL | 0 refills | Status: DC

## 2020-06-29 ENCOUNTER — Other Ambulatory Visit: Payer: Self-pay | Admitting: Family Medicine

## 2020-07-04 DIAGNOSIS — M5416 Radiculopathy, lumbar region: Secondary | ICD-10-CM | POA: Diagnosis not present

## 2020-07-04 DIAGNOSIS — M545 Low back pain, unspecified: Secondary | ICD-10-CM | POA: Diagnosis not present

## 2020-07-04 DIAGNOSIS — M6281 Muscle weakness (generalized): Secondary | ICD-10-CM | POA: Diagnosis not present

## 2020-07-08 DIAGNOSIS — M545 Low back pain, unspecified: Secondary | ICD-10-CM | POA: Diagnosis not present

## 2020-07-08 DIAGNOSIS — M6281 Muscle weakness (generalized): Secondary | ICD-10-CM | POA: Diagnosis not present

## 2020-07-08 DIAGNOSIS — M5416 Radiculopathy, lumbar region: Secondary | ICD-10-CM | POA: Diagnosis not present

## 2020-07-13 DIAGNOSIS — M6281 Muscle weakness (generalized): Secondary | ICD-10-CM | POA: Diagnosis not present

## 2020-07-13 DIAGNOSIS — M545 Low back pain, unspecified: Secondary | ICD-10-CM | POA: Diagnosis not present

## 2020-07-13 DIAGNOSIS — M5416 Radiculopathy, lumbar region: Secondary | ICD-10-CM | POA: Diagnosis not present

## 2020-07-14 DIAGNOSIS — M545 Low back pain, unspecified: Secondary | ICD-10-CM | POA: Diagnosis not present

## 2020-07-14 DIAGNOSIS — M6281 Muscle weakness (generalized): Secondary | ICD-10-CM | POA: Diagnosis not present

## 2020-07-14 DIAGNOSIS — M5416 Radiculopathy, lumbar region: Secondary | ICD-10-CM | POA: Diagnosis not present

## 2020-07-20 DIAGNOSIS — M6281 Muscle weakness (generalized): Secondary | ICD-10-CM | POA: Diagnosis not present

## 2020-07-20 DIAGNOSIS — M545 Low back pain, unspecified: Secondary | ICD-10-CM | POA: Diagnosis not present

## 2020-07-20 DIAGNOSIS — M5416 Radiculopathy, lumbar region: Secondary | ICD-10-CM | POA: Diagnosis not present

## 2020-07-27 DIAGNOSIS — M5416 Radiculopathy, lumbar region: Secondary | ICD-10-CM | POA: Diagnosis not present

## 2020-07-27 DIAGNOSIS — M6281 Muscle weakness (generalized): Secondary | ICD-10-CM | POA: Diagnosis not present

## 2020-07-27 DIAGNOSIS — M545 Low back pain, unspecified: Secondary | ICD-10-CM | POA: Diagnosis not present

## 2020-07-28 DIAGNOSIS — M545 Low back pain, unspecified: Secondary | ICD-10-CM | POA: Diagnosis not present

## 2020-07-28 DIAGNOSIS — M5416 Radiculopathy, lumbar region: Secondary | ICD-10-CM | POA: Diagnosis not present

## 2020-07-28 DIAGNOSIS — M6281 Muscle weakness (generalized): Secondary | ICD-10-CM | POA: Diagnosis not present

## 2020-08-01 DIAGNOSIS — H353221 Exudative age-related macular degeneration, left eye, with active choroidal neovascularization: Secondary | ICD-10-CM | POA: Diagnosis not present

## 2020-08-02 ENCOUNTER — Encounter: Payer: Self-pay | Admitting: Gastroenterology

## 2020-08-02 DIAGNOSIS — M6281 Muscle weakness (generalized): Secondary | ICD-10-CM | POA: Diagnosis not present

## 2020-08-02 DIAGNOSIS — M5416 Radiculopathy, lumbar region: Secondary | ICD-10-CM | POA: Diagnosis not present

## 2020-08-02 DIAGNOSIS — M545 Low back pain, unspecified: Secondary | ICD-10-CM | POA: Diagnosis not present

## 2020-08-04 DIAGNOSIS — M5416 Radiculopathy, lumbar region: Secondary | ICD-10-CM | POA: Diagnosis not present

## 2020-08-04 DIAGNOSIS — M6281 Muscle weakness (generalized): Secondary | ICD-10-CM | POA: Diagnosis not present

## 2020-08-04 DIAGNOSIS — M545 Low back pain, unspecified: Secondary | ICD-10-CM | POA: Diagnosis not present

## 2020-08-05 ENCOUNTER — Ambulatory Visit (INDEPENDENT_AMBULATORY_CARE_PROVIDER_SITE_OTHER): Payer: PPO

## 2020-08-05 ENCOUNTER — Other Ambulatory Visit: Payer: Self-pay

## 2020-08-05 DIAGNOSIS — Z0000 Encounter for general adult medical examination without abnormal findings: Secondary | ICD-10-CM | POA: Diagnosis not present

## 2020-08-05 DIAGNOSIS — Z1211 Encounter for screening for malignant neoplasm of colon: Secondary | ICD-10-CM

## 2020-08-05 NOTE — Progress Notes (Signed)
Subjective:   Mercedes [name] is a 75 y.o. female who presents for Medicare Annual (Subsequent) preventive examination.  Review of Systems: N/A      I connected with the patient today by telephone and verified that I am speaking with the correct person using two identifiers. Location patient: home Location nurse: work Persons participating in the telephone visit: patient, nurse.   I discussed the limitations, risks, security and privacy concerns of performing an evaluation and management service by telephone and the availability of in person appointments. I also discussed with the patient that there may be a patient responsible charge related to this service. The patient expressed understanding and verbally consented to this telephonic visit.        Cardiac Risk Factors include: advanced age (>51men, >15 women);hypertension;Other (see comment), Risk factor comments: hyperlipidemia     Objective:    Today's Vitals   08/05/20 1346  PainSc: 6    There is no height or weight on file to calculate BMI.  Advanced Directives 08/05/2020 01/14/2018 08/13/2017 08/06/2017 08/02/2017 07/30/2017 03/13/2017  Does Patient Have a Medical Advance Directive? Yes Yes No - No No Yes  Type of Paramedic of [place];Living will [place];Living will - - - - Press photographer;Living will  Does patient want to make changes to medical advance directive? - - - - - - -  Copy of [place] in Chart? No - copy requested No - copy requested - - - - No - copy requested  Would patient like information on creating a medical advance directive? - - No - Patient declined No - Patient declined No - Patient declined No - Patient declined -    Current Medications (verified) Outpatient Encounter Medications as of 08/05/2020  Medication Sig  . cholecalciferol (VITAMIN D) 1000 units tablet Take 1,000 Units by mouth 2 (two) times a day.   . ciprofloxacin  (CIPRO) 250 MG tablet Take 1 tablet (250 mg total) by mouth 2 (two) times daily.  . colchicine 0.6 MG tablet Take 1 tablet by mouth twice daily as needed  . levothyroxine (SYNTHROID) 200 MCG tablet TAKE 1 TABLET BY MOUTH ONCE DAILY BEFORE BREAKFAST  . levothyroxine (SYNTHROID) 25 MCG tablet Take 1 tablet (25 mcg total) by mouth daily before breakfast. On Monday through Fridays.  Skip on Saturday and Sunday.  . metoprolol (TOPROL-XL) 200 MG 24 hr tablet Take 1 tablet (200 mg total) by mouth every morning.  . Multiple Vitamin (MULTIVITAMIN WITH MINERALS) TABS tablet Take 1 tablet by mouth daily.  . Multiple Vitamins-Minerals (PRESERVISION AREDS 2) CAPS Take 1 capsule by mouth 2 (two) times daily.  . naproxen sodium (ALEVE) 220 MG tablet Take 220 mg by mouth in the morning and at bedtime.  . Omega 3 1000 MG CAPS Take 2,000 mg by mouth daily.  . Potassium 99 MG TABS Take 99 mg by mouth every morning.  . pravastatin (PRAVACHOL) 40 MG tablet Take 1 tablet (40 mg total) by mouth daily.  . psyllium (REGULOID) 0.52 G capsule Take 0.52 g by mouth daily. Two tablets per day  . TURMERIC PO Take 1 capsule by mouth.  . zinc gluconate 50 MG tablet Take 50 mg by mouth daily.   No facility-administered encounter medications on file as of 08/05/2020.    Allergies (verified) Ace inhibitors, Clarithromycin, Fenofibrate, Lipitor [atorvastatin], Niacin, Penicillins, Sulfasalazine, and Tetracycline hcl   History: Past Medical History:  Diagnosis Date  . Allergy   .  Cataract    bilateral-not an issue right now  . Diverticulosis of colon   . Fatty liver   . GERD (gastroesophageal reflux disease)   . Gout   . 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 HLD (hyperlipidemia)    under control  . HTN (hypertension)   . Hypothyroidism   . Macular degeneration   . Osteoarthritis   . Rapid heartbeat    occasional, does not last long  . Tobacco abuse    Past Surgical History:  Procedure Laterality Date  . CATARACT  EXTRACTION    . CHOLECYSTECTOMY N/A 03/17/2014   Procedure: LAPAROSCOPIC CHOLECYSTECTOMY WITH INTRAOPERATIVE CHOLANGIOGRAM ;  Surgeon: Fanny Skates, MD;  Location: [facility];  Service: General;  Laterality: N/A;  . COLONOSCOPY     removed polyps  . SIGMOIDOSCOPY     Family History  Problem Relation Age of Onset  . Ulcers Father   . Angina Mother   . Hypertension Brother   . Prostate cancer Brother   . Colon cancer Neg Hx   . Breast cancer Neg Hx   . Esophageal cancer Neg Hx   . Stomach cancer Neg Hx   . Rectal cancer Neg Hx    Social History   Socioeconomic History  . Marital status: Married    Spouse name: Not on file  . Number of children: 2  . Years of education: Not on file  . Highest education level: Not on file  Occupational History  . Occupation: Teacher-retired  Tobacco Use  . Smoking status: Former Smoker    Packs/day: 0.25    Years: 15.00    Pack years: 3.75    Types: Cigarettes    Quit date: 07/04/2010    Years since quitting: 10.0  . Smokeless tobacco: Never Used  Vaping Use  . Vaping Use: Never used  Substance and Sexual Activity  . Alcohol use: Yes    Comment: Wine, 1 per day  . Drug use: No  . Sexual activity: Never  Other Topics Concern  . Not on file  Social History Narrative   Married 1964   2 kids, in [place].  2 grandkids   Volunteered prev with [employer]   Enjoyed golf but gave it up after her husband had a stroke   Social Determinants of Radio broadcast assistant Strain: Low Risk   . Difficulty of Paying Living Expenses: Not hard at all  Food Insecurity: No Food Insecurity  . Worried About Charity fundraiser in the Last Year: Never true  . Ran Out of Food in the Last Year: Never true  Transportation Needs: No Transportation Needs  . Lack of Transportation (Medical): No  . Lack of Transportation (Non-Medical): No  Physical Activity: Inactive  . Days of Exercise per Week: 0 days  . Minutes of Exercise per Session: 0 min   Stress: No Stress Concern Present  . Feeling of Stress : Not at all  Social Connections: Not on file    Tobacco Counseling Counseling given: Not Answered   Clinical Intake:  Pre-visit preparation completed: Yes  Pain : 0-10 Pain Score: 6  Pain Type: Chronic pain Pain Location: Back Pain Descriptors / Indicators: Aching Pain Onset: More than a month ago Pain Frequency: Intermittent     Nutritional Risks: None Diabetes: No  How often do you need to have someone help you when you read instructions, pamphlets, or other written materials from your doctor or pharmacy?: 1 - Never What is the  last grade level you completed in school?: masters  Diabetic: No Nutrition Risk Assessment:  Has the patient had any N/V/D within the last 2 months?  No  Does the patient have any non-healing wounds?  No  Has the patient had any unintentional weight loss or weight gain?  No   Diabetes:  Is the patient diabetic?  No  If diabetic, was a CBG obtained today?  N/A Did the patient bring in their glucometer from home?  N/A How often do you monitor your CBG's? N/A.   Financial Strains and Diabetes Management:  Are you having any financial strains with the device, your supplies or your medication? N/A.  Does the patient want to be seen by Chronic Care Management for management of their diabetes?  N/A Would the patient like to be referred to a Nutritionist or for Diabetic Management?  N/A   Interpreter Needed?: No  Information entered by :: CJohnson, LPN   Activities of Daily Living In your present state of health, do you have any difficulty performing the following activities: 08/05/2020  Hearing? N  Vision? N  Difficulty concentrating or making decisions? N  Walking or climbing stairs? N  Dressing or bathing? N  Doing errands, shopping? N  Preparing Food and eating ? N  Using the Toilet? N  In the past six months, have you accidently leaked urine? N  Do you have problems with  loss of bowel control? N  Managing your Medications? N  Managing your Finances? N  Housekeeping or managing your Housekeeping? N  Some recent data might be hidden    Patient Care Team: Tonia Ghent, MD as PCP - General (Family Medicine) Leandrew Koyanagi, MD as Referring Physician (Ophthalmology) Ralene Bathe, MD as Referring Physician (Dermatology)  Indicate any recent Medical Services you may have received from other than [facility] providers in the past year (date may be approximate).     Assessment:   This is a routine wellness examination for Marry.  Hearing/Vision screen  Hearing Screening   125Hz  250Hz  500Hz  1000Hz  2000Hz  3000Hz  4000Hz  6000Hz  8000Hz   Right ear:           Left ear:           Vision Screening Comments: Patient gets annual eye exams   Dietary issues and exercise activities discussed: Current Exercise Habits: The patient does not participate in regular exercise at present, Exercise limited by: None identified  Goals    . Patient Stated     08/05/2020, I will maintain and continue medications as prescribed.     . Weight < 200 lb (90.719 kg)     Target weight loss is 50 lbs. Starting 01/14/2018, I will continue to drink protein shakes for 2 meals and minimize intake of carbs during dinner. Daily intake of carbs does not exceed 20 grams.        Depression Screen PHQ 2/9 Scores 08/05/2020 06/17/2020 01/22/2019 01/14/2018 12/27/2016 12/29/2015 12/23/2015  PHQ - 2 Score 0 0 0 0 0 0 0  PHQ- 9 Score 0 - - 0 - - -    Fall Risk Fall Risk  08/05/2020 06/17/2020 01/22/2019 01/14/2018 12/27/2016  Falls in the past year? 0 0 0 No No  Number falls in past yr: 0 0 - - -  Injury with Fall? 0 0 - - -  Risk for fall due to : No Fall Risks - - - -  Follow up Falls evaluation completed;Falls prevention discussed Falls evaluation completed - - -  FALL RISK PREVENTION PERTAINING TO THE HOME:  Any stairs in or around the home? Yes  If so, are there any without handrails? No   Home free of loose throw rugs in walkways, pet beds, electrical cords, etc? Yes  Adequate lighting in your home to reduce risk of falls? Yes   ASSISTIVE DEVICES UTILIZED TO PREVENT FALLS:  Life alert? No  Use of a cane, walker or w/c? No  Grab bars in the bathroom? No  Shower chair or bench in shower? No  Elevated toilet seat or a handicapped toilet? No   TIMED UP AND GO:  Was the test performed? N/A telephone visit.  Cognitive Function: MMSE - Mini Mental State Exam 08/05/2020 01/14/2018 12/23/2015  Orientation to time 5 5 5   Orientation to Place 5 5 5   Registration 3 3 3   Attention/ Calculation 5 0 0  Recall 3 3 3   Language- name 2 objects - 0 0  Language- repeat 1 1 1   Language- follow 3 step command - 3 3  Language- read & follow direction - 0 0  Write a sentence - 0 0  Copy design - 0 0  Total score - 20 20  Mini Cog  Mini-Cog screen was completed. Maximum score is 22. A value of 0 denotes this part of the MMSE was not completed or the patient failed this part of the Mini-Cog screening.       Immunizations Immunization History  Administered Date(s) Administered  . Fluad Quad(high Dose 65+) 03/12/2019, 03/26/2020  . H1N1 07/08/2008  . Influenza Whole 05/23/2004  . Influenza,inj,Quad PF,6+ Mos 04/07/2014, 03/31/2015, 04/18/2016, 04/04/2017, 04/17/2018  . PFIZER(Purple Top)SARS-COV-2 Vaccination 07/09/2019, 07/30/2019, 04/25/2020  . Pneumococcal Conjugate-13 12/13/2014  . Pneumococcal Polysaccharide-23 08/09/2010  . Rabies, IM 07/30/2017, 08/02/2017, 08/06/2017, 08/13/2017  . Td 07/02/1998, 07/08/2008  . Tdap 07/30/2017  . Zoster 10/30/2010    TDAP status: Up to date  Flu Vaccine status: Up to date  Pneumococcal vaccine status: Up to date  Covid-19 vaccine status: Completed vaccines  Qualifies for Shingles Vaccine? Yes   Zostavax completed Yes   Shingrix Completed?: No.    Education has been provided regarding the importance of this vaccine. Patient has  been advised to call insurance company to determine out of pocket expense if they have not yet received this vaccine. Advised may also receive vaccine at local pharmacy or Health Dept. Verbalized acceptance and understanding.  Screening Tests Health Maintenance  Topic Date Due  . COLONOSCOPY (Pts 45-60yrs Insurance coverage will need to be confirmed)  03/13/2020  . MAMMOGRAM  06/13/2021  . TETANUS/TDAP  07/31/2027  . INFLUENZA VACCINE  Completed  . DEXA SCAN  Completed  . COVID-19 Vaccine  Completed  . Hepatitis C Screening  Completed  . PNA vac Low Risk Adult  Completed    Health Maintenance  Health Maintenance Due  Topic Date Due  . COLONOSCOPY (Pts 45-16yrs Insurance coverage will need to be confirmed)  03/13/2020    Colorectal cancer screening: Referral to GI placed 08/05/2020. Pt aware the office will call re: appt.  Mammogram status: Completed 06/13/2020. Repeat every year  Bone Density status: Completed 04/13/2020. Results reflect: Bone density results: OSTEOPENIA. Repeat every 2 years.  Lung Cancer Screening: (Low Dose CT Chest recommended if Age 63-80 years, 30 pack-year currently smoking OR have quit w/in 15years.) does not qualify.  Additional Screening:  Hepatitis C Screening: does qualify; Completed 12/23/2015   Vision Screening: Recommended annual ophthalmology exams for early detection of glaucoma  and other disorders of the eye. Is the patient up to date with their annual eye exam?  Yes  Who is the provider or what is the name of the office in which the patient attends annual eye exams? Dr. Isaias Sakai, [facility] If pt is not established with a provider, would they like to be referred to a provider to establish care? No .   Dental Screening: Recommended annual dental exams for proper oral hygiene  Community Resource Referral / Chronic Care Management: CRR required this visit?  No   CCM required this visit?  No      Plan:     I have personally  reviewed and noted the following in the patient's chart:   . Medical and social history . Use of alcohol, tobacco or illicit drugs  . Current medications and supplements . Functional ability and status . Nutritional status . Physical activity . Advanced directives . List of other physicians . Hospitalizations, surgeries, and ER visits in previous 12 months . Vitals . Screenings to include cognitive, depression, and falls . Referrals and appointments  In addition, I have reviewed and discussed with patient certain preventive protocols, quality metrics, and best practice recommendations. A written personalized care plan for preventive services as well as general preventive health recommendations were provided to patient.   Due to this being a telephonic visit, the after visit summary with patients personalized plan was offered to patient via office or my-chart. Patient preferred to pick up at office at next visit or via mychart.   Andrez Grime, LPN   [id number]

## 2020-08-05 NOTE — Progress Notes (Signed)
PCP notes:  Health Maintenance: Colonoscopy- due, referral placed   Abnormal Screenings: none   Patient concerns: none   Nurse concerns: none   Next PCP appt.: 12/05/2020 @ 9 am

## 2020-08-05 NOTE — Patient Instructions (Signed)
Mercedes [name] , Thank you for taking time to come for your Medicare Wellness Visit. I appreciate your ongoing commitment to your health goals. Please review the following plan we discussed and let me know if I can assist you in the future.   Screening recommendations/referrals: Colonoscopy: due, will order referral, office should contact you for appointment  Mammogram: Up to date, completed 06/13/2020, due 06/2021 Bone Density: Up to date, completed 04/13/2020, due 04/2022 Recommended yearly ophthalmology/optometry visit for glaucoma screening and checkup Recommended yearly dental visit for hygiene and checkup  Vaccinations: Influenza vaccine: Up to date, completed 03/26/2020, due 01/2021 Pneumococcal vaccine: Completed series Tdap vaccine: Up to date, completed 07/30/2017, due 07/2027 Shingles vaccine: due, check with your insurance regarding coverage if interested    Covid-19:Completed series  Advanced directives: Please bring a copy of your POA (Power of [place]) and/or Living Will to your next appointment.   Conditions/risks identified: hypertension, hyperlipidemia   Next appointment: Follow up in one year for your annual wellness visit    Preventive Care 77 Years and Older, Female Preventive care refers to lifestyle choices and visits with your health care provider that can promote health and wellness. What does preventive care include?  A yearly physical exam. This is also called an annual well check.  Dental exams once or twice a year.  Routine eye exams. Ask your health care provider how often you should have your eyes checked.  Personal lifestyle choices, including:  Daily care of your teeth and gums.  Regular physical activity.  Eating a healthy diet.  Avoiding tobacco and drug use.  Limiting alcohol use.  Practicing safe sex.  Taking low-dose aspirin every day.  Taking vitamin and mineral supplements as recommended by your health care provider. What happens  during an annual well check? The services and screenings done by your health care provider during your annual well check will depend on your age, overall health, lifestyle risk factors, and family history of disease. Counseling  Your health care provider may ask you questions about your:  Alcohol use.  Tobacco use.  Drug use.  Emotional well-being.  Home and relationship well-being.  Sexual activity.  Eating habits.  History of falls.  Memory and ability to understand (cognition).  Work and work Statistician.  Reproductive health. Screening  You may have the following tests or measurements:  Height, weight, and BMI.  Blood pressure.  Lipid and cholesterol levels. These may be checked every 5 years, or more frequently if you are over 45 years old.  Skin check.  Lung cancer screening. You may have this screening every year starting at age 59 if you have a 30-pack-year history of smoking and currently smoke or have quit within the past 15 years.  Fecal occult blood test (FOBT) of the stool. You may have this test every year starting at age 21.  Flexible sigmoidoscopy or colonoscopy. You may have a sigmoidoscopy every 5 years or a colonoscopy every 10 years starting at age 56.  Hepatitis C blood test.  Hepatitis B blood test.  Sexually transmitted disease (STD) testing.  Diabetes screening. This is done by checking your blood sugar (glucose) after you have not eaten for a while (fasting). You may have this done every 1-3 years.  Bone density scan. This is done to screen for osteoporosis. You may have this done starting at age 7.  Mammogram. This may be done every 1-2 years. Talk to your health care provider about how often you should have regular mammograms.  Talk with your health care provider about your test results, treatment options, and if necessary, the need for more tests. Vaccines  Your health care provider may recommend certain vaccines, such  as:  Influenza vaccine. This is recommended every year.  Tetanus, diphtheria, and acellular pertussis (Tdap, Td) vaccine. You may need a Td booster every 10 years.  Zoster vaccine. You may need this after age 72.  Pneumococcal 13-valent conjugate (PCV13) vaccine. One dose is recommended after age 58.  Pneumococcal polysaccharide (PPSV23) vaccine. One dose is recommended after age 62. Talk to your health care provider about which screenings and vaccines you need and how often you need them. This information is not intended to replace advice given to you by your health care provider. Make sure you discuss any questions you have with your health care provider. Document Released: 07/15/2015 Document Revised: 03/07/2016 Document Reviewed: 04/19/2015 Elsevier Interactive Patient Education  2017 [place] Prevention in the Home Falls can cause injuries. They can happen to people of all ages. There are many things you can do to make your home safe and to help prevent falls. What can I do on the outside of my home?  Regularly fix the edges of walkways and driveways and fix any cracks.  Remove anything that might make you trip as you walk through a door, such as a raised step or threshold.  Trim any bushes or trees on the path to your home.  Use bright outdoor lighting.  Clear any walking paths of anything that might make someone trip, such as rocks or tools.  Regularly check to see if handrails are loose or broken. Make sure that both sides of any steps have handrails.  Any raised decks and porches should have guardrails on the edges.  Have any leaves, snow, or ice cleared regularly.  Use sand or salt on walking paths during winter.  Clean up any spills in your garage right away. This includes oil or grease spills. What can I do in the bathroom?  Use night lights.  Install grab bars by the toilet and in the tub and shower. Do not use towel bars as grab bars.  Use  non-skid mats or decals in the tub or shower.  If you need to sit down in the shower, use a plastic, non-slip stool.  Keep the floor dry. Clean up any water that spills on the floor as soon as it happens.  Remove soap buildup in the tub or shower regularly.  Attach bath mats securely with double-sided non-slip rug tape.  Do not have throw rugs and other things on the floor that can make you trip. What can I do in the bedroom?  Use night lights.  Make sure that you have a light by your bed that is easy to reach.  Do not use any sheets or blankets that are too big for your bed. They should not hang down onto the floor.  Have a firm chair that has side arms. You can use this for support while you get dressed.  Do not have throw rugs and other things on the floor that can make you trip. What can I do in the kitchen?  Clean up any spills right away.  Avoid walking on wet floors.  Keep items that you use a lot in easy-to-reach places.  If you need to reach something above you, use a strong step stool that has a grab bar.  Keep electrical cords out of the way.  Do not use floor polish or wax that makes floors slippery. If you must use wax, use non-skid floor wax.  Do not have throw rugs and other things on the floor that can make you trip. What can I do with my stairs?  Do not leave any items on the stairs.  Make sure that there are handrails on both sides of the stairs and use them. Fix handrails that are broken or loose. Make sure that handrails are as long as the stairways.  Check any carpeting to make sure that it is firmly attached to the stairs. Fix any carpet that is loose or worn.  Avoid having throw rugs at the top or bottom of the stairs. If you do have throw rugs, attach them to the floor with carpet tape.  Make sure that you have a light switch at the top of the stairs and the bottom of the stairs. If you do not have them, ask someone to add them for you. What  else can I do to help prevent falls?  Wear shoes that:  Do not have high heels.  Have rubber bottoms.  Are comfortable and fit you well.  Are closed at the toe. Do not wear sandals.  If you use a stepladder:  Make sure that it is fully opened. Do not climb a closed stepladder.  Make sure that both sides of the stepladder are locked into place.  Ask someone to hold it for you, if possible.  Clearly mark and make sure that you can see:  Any grab bars or handrails.  First and last steps.  Where the edge of each step is.  Use tools that help you move around (mobility aids) if they are needed. These include:  Canes.  Walkers.  Scooters.  Crutches.  Turn on the lights when you go into a dark area. Replace any light bulbs as soon as they burn out.  Set up your furniture so you have a clear path. Avoid moving your furniture around.  If any of your floors are uneven, fix them.  If there are any pets around you, be aware of where they are.  Review your medicines with your doctor. Some medicines can make you feel dizzy. This can increase your chance of falling. Ask your doctor what other things that you can do to help prevent falls. This information is not intended to replace advice given to you by your health care provider. Make sure you discuss any questions you have with your health care provider. Document Released: 04/14/2009 Document Revised: 11/24/2015 Document Reviewed: 07/23/2014 Elsevier Interactive Patient Education  2017 [employer].

## 2020-08-08 DIAGNOSIS — M6281 Muscle weakness (generalized): Secondary | ICD-10-CM | POA: Diagnosis not present

## 2020-08-08 DIAGNOSIS — M545 Low back pain, unspecified: Secondary | ICD-10-CM | POA: Diagnosis not present

## 2020-08-08 DIAGNOSIS — M5416 Radiculopathy, lumbar region: Secondary | ICD-10-CM | POA: Diagnosis not present

## 2020-08-10 DIAGNOSIS — M545 Low back pain, unspecified: Secondary | ICD-10-CM | POA: Diagnosis not present

## 2020-08-10 DIAGNOSIS — M5416 Radiculopathy, lumbar region: Secondary | ICD-10-CM | POA: Diagnosis not present

## 2020-08-10 DIAGNOSIS — M6281 Muscle weakness (generalized): Secondary | ICD-10-CM | POA: Diagnosis not present

## 2020-08-15 DIAGNOSIS — M5416 Radiculopathy, lumbar region: Secondary | ICD-10-CM | POA: Diagnosis not present

## 2020-08-15 DIAGNOSIS — M6281 Muscle weakness (generalized): Secondary | ICD-10-CM | POA: Diagnosis not present

## 2020-08-15 DIAGNOSIS — M545 Low back pain, unspecified: Secondary | ICD-10-CM | POA: Diagnosis not present

## 2020-08-17 DIAGNOSIS — M6281 Muscle weakness (generalized): Secondary | ICD-10-CM | POA: Diagnosis not present

## 2020-08-17 DIAGNOSIS — M545 Low back pain, unspecified: Secondary | ICD-10-CM | POA: Diagnosis not present

## 2020-08-17 DIAGNOSIS — M5416 Radiculopathy, lumbar region: Secondary | ICD-10-CM | POA: Diagnosis not present

## 2020-08-24 DIAGNOSIS — M545 Low back pain, unspecified: Secondary | ICD-10-CM | POA: Diagnosis not present

## 2020-08-24 DIAGNOSIS — M6281 Muscle weakness (generalized): Secondary | ICD-10-CM | POA: Diagnosis not present

## 2020-08-24 DIAGNOSIS — M5416 Radiculopathy, lumbar region: Secondary | ICD-10-CM | POA: Diagnosis not present

## 2020-08-26 DIAGNOSIS — M545 Low back pain, unspecified: Secondary | ICD-10-CM | POA: Diagnosis not present

## 2020-08-26 DIAGNOSIS — M5416 Radiculopathy, lumbar region: Secondary | ICD-10-CM | POA: Diagnosis not present

## 2020-08-26 DIAGNOSIS — M6281 Muscle weakness (generalized): Secondary | ICD-10-CM | POA: Diagnosis not present

## 2020-08-29 DIAGNOSIS — H353221 Exudative age-related macular degeneration, left eye, with active choroidal neovascularization: Secondary | ICD-10-CM | POA: Diagnosis not present

## 2020-08-31 DIAGNOSIS — M5416 Radiculopathy, lumbar region: Secondary | ICD-10-CM | POA: Diagnosis not present

## 2020-08-31 DIAGNOSIS — M6281 Muscle weakness (generalized): Secondary | ICD-10-CM | POA: Diagnosis not present

## 2020-08-31 DIAGNOSIS — M545 Low back pain, unspecified: Secondary | ICD-10-CM | POA: Diagnosis not present

## 2020-09-02 DIAGNOSIS — M5416 Radiculopathy, lumbar region: Secondary | ICD-10-CM | POA: Diagnosis not present

## 2020-09-02 DIAGNOSIS — M545 Low back pain, unspecified: Secondary | ICD-10-CM | POA: Diagnosis not present

## 2020-09-02 DIAGNOSIS — M6281 Muscle weakness (generalized): Secondary | ICD-10-CM | POA: Diagnosis not present

## 2020-09-05 DIAGNOSIS — M5416 Radiculopathy, lumbar region: Secondary | ICD-10-CM | POA: Diagnosis not present

## 2020-09-05 DIAGNOSIS — M6281 Muscle weakness (generalized): Secondary | ICD-10-CM | POA: Diagnosis not present

## 2020-09-05 DIAGNOSIS — M545 Low back pain, unspecified: Secondary | ICD-10-CM | POA: Diagnosis not present

## 2020-09-07 DIAGNOSIS — M5416 Radiculopathy, lumbar region: Secondary | ICD-10-CM | POA: Diagnosis not present

## 2020-09-07 DIAGNOSIS — M545 Low back pain, unspecified: Secondary | ICD-10-CM | POA: Diagnosis not present

## 2020-09-07 DIAGNOSIS — M6281 Muscle weakness (generalized): Secondary | ICD-10-CM | POA: Diagnosis not present

## 2020-09-12 DIAGNOSIS — M545 Low back pain, unspecified: Secondary | ICD-10-CM | POA: Diagnosis not present

## 2020-09-12 DIAGNOSIS — M6281 Muscle weakness (generalized): Secondary | ICD-10-CM | POA: Diagnosis not present

## 2020-09-12 DIAGNOSIS — M5416 Radiculopathy, lumbar region: Secondary | ICD-10-CM | POA: Diagnosis not present

## 2020-09-14 DIAGNOSIS — M6281 Muscle weakness (generalized): Secondary | ICD-10-CM | POA: Diagnosis not present

## 2020-09-14 DIAGNOSIS — M545 Low back pain, unspecified: Secondary | ICD-10-CM | POA: Diagnosis not present

## 2020-09-14 DIAGNOSIS — M5416 Radiculopathy, lumbar region: Secondary | ICD-10-CM | POA: Diagnosis not present

## 2020-10-03 DIAGNOSIS — H353221 Exudative age-related macular degeneration, left eye, with active choroidal neovascularization: Secondary | ICD-10-CM | POA: Diagnosis not present

## 2020-10-18 ENCOUNTER — Ambulatory Visit (INDEPENDENT_AMBULATORY_CARE_PROVIDER_SITE_OTHER): Payer: PPO | Admitting: Gastroenterology

## 2020-10-18 ENCOUNTER — Encounter: Payer: Self-pay | Admitting: Gastroenterology

## 2020-10-18 VITALS — BP 190/90 | HR 80 | Ht 63.5 in | Wt 226.0 lb

## 2020-10-18 DIAGNOSIS — Z86010 Personal history of colonic polyps: Secondary | ICD-10-CM | POA: Diagnosis not present

## 2020-10-18 MED ORDER — SUPREP BOWEL PREP KIT 17.5-3.13-1.6 GM/177ML PO SOLN: 1.0000 | ORAL | 0 refills | Status: DC

## 2020-10-18 NOTE — Patient Instructions (Signed)
If you are age 77 or older, your body mass index should be between 23-30. Your Body mass index is 39.41 kg/m. If this is out of the aforementioned range listed, please consider follow up with your Primary Care Provider.  You have been scheduled for a colonoscopy. Please follow written instructions given to you at your visit today.  Please pick up your prep supplies at the pharmacy within the next 1-3 days. If you use inhalers (even only as needed), please bring them with you on the day of your procedure.  Due to recent changes in healthcare laws, you may see the results of your imaging and laboratory studies on MyChart before your provider has had a chance to review them.  We understand that in some cases there may be results that are confusing or concerning to you. Not all laboratory results come back in the same time frame and the provider may be waiting for multiple results in order to interpret others.  Please give [place] 48 hours in order for your provider to thoroughly review all the results before contacting the office for clarification of your results.   Thank you for entrusting me with your care and choosing [facility].  Dr Ardis Hughs

## 2020-10-18 NOTE — Progress Notes (Signed)
Review of pertinent gastrointestinal problems: 1.  Personal history of precancerous colon polyps 2015 colonoscopy 6 adenomas, including one that was 1.2cm.  Colonoscopy September 2018 3 subcentimeter polyps were found and removed.  These were mixed adenomatous and sessile serrated polyps.   HPI: This is a very pleasant 75 year old woman whom I last saw about 4 years ago at the time of a colonoscopy.  See those results summarized above  Since then her health has fared fairly well.  She has put on about 10 pounds but otherwise she is in quite good health.  She has no serious heart problems, no lung problems, she is on no blood thinners.  She takes care of her house and herself.  She has no real GI symptoms, no bleeding no serious constipation or diarrhea.   ROS: complete GI ROS as described in HPI, all other review negative.  Constitutional:  No unintentional weight loss   Past Medical History:  Diagnosis Date  . Allergy   . Cataract    bilateral-not an issue right now  . Diverticulosis of colon   . Fatty liver   . GERD (gastroesophageal reflux disease)   . Gout   . 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 HLD (hyperlipidemia)    under control  . HTN (hypertension)   . Hypothyroidism   . Macular degeneration   . Osteoarthritis   . Rapid heartbeat    occasional, does not last long  . Tobacco abuse     Past Surgical History:  Procedure Laterality Date  . CATARACT EXTRACTION    . CHOLECYSTECTOMY N/A 03/17/2014   Procedure: LAPAROSCOPIC CHOLECYSTECTOMY WITH INTRAOPERATIVE CHOLANGIOGRAM ;  Surgeon: Fanny Skates, MD;  Location: [facility];  Service: General;  Laterality: N/A;  . COLONOSCOPY     removed polyps  . SIGMOIDOSCOPY      Current Outpatient Medications  Medication Sig Dispense Refill  . Aflibercept (EYLEA) 2 MG/0.05ML SOLN Place 2 mg into the left eye every 30 (thirty) days.    . cholecalciferol (VITAMIN D) 1000 units tablet Take 1,000 Units by mouth 2 (two) times a day.     .  colchicine 0.6 MG tablet Take 1 tablet by mouth twice daily as needed 60 tablet 0  . levothyroxine (SYNTHROID) 200 MCG tablet TAKE 1 TABLET BY MOUTH ONCE DAILY BEFORE BREAKFAST 90 tablet 3  . levothyroxine (SYNTHROID) 25 MCG tablet Take 1 tablet (25 mcg total) by mouth daily before breakfast. On Monday through Fridays.  Skip on Saturday and Sunday. 90 tablet 3  . metoprolol (TOPROL-XL) 200 MG 24 hr tablet Take 1 tablet (200 mg total) by mouth every morning. 90 tablet 3  . Multiple Vitamin (MULTIVITAMIN WITH MINERALS) TABS tablet Take 1 tablet by mouth daily.    . Multiple Vitamins-Minerals (PRESERVISION AREDS 2) CAPS Take 1 capsule by mouth 2 (two) times daily.    . naproxen sodium (ALEVE) 220 MG tablet Take 220 mg by mouth in the morning and at bedtime.    . Omega 3 1000 MG CAPS Take 2,000 mg by mouth daily.    . Potassium 99 MG TABS Take 99 mg by mouth every morning.    . pravastatin (PRAVACHOL) 40 MG tablet Take 1 tablet (40 mg total) by mouth daily. 90 tablet 3  . psyllium (REGULOID) 0.52 G capsule Take 0.52 g by mouth daily. Two tablets per day    . TURMERIC PO Take 1 capsule by mouth.    . zinc gluconate 50 MG tablet Take 50  mg by mouth daily.     No current facility-administered medications for this visit.    Allergies as of 10/18/2020 - Review Complete 10/18/2020  Allergen Reaction Noted  . Ace inhibitors    . Clarithromycin    . Fenofibrate    . Lipitor [atorvastatin]  07/28/2012  . Niacin  01/14/2007  . Penicillins  01/14/2007  . Sulfasalazine    . Tetracycline hcl      Family History  Problem Relation Age of Onset  . Ulcers Father   . Angina Mother   . Hypertension Brother   . Prostate cancer Brother   . Colon cancer Neg Hx   . Breast cancer Neg Hx   . Esophageal cancer Neg Hx   . Stomach cancer Neg Hx   . Rectal cancer Neg Hx     Social History   Socioeconomic History  . Marital status: Married    Spouse name: Not on file  . Number of children: 2  . Years  of education: Not on file  . Highest education level: Not on file  Occupational History  . Occupation: Teacher-retired  Tobacco Use  . Smoking status: Former Smoker    Packs/day: 0.25    Years: 15.00    Pack years: 3.75    Types: Cigarettes    Quit date: 07/04/2010    Years since quitting: 10.2  . Smokeless tobacco: Never Used  Vaping Use  . Vaping Use: Never used  Substance and Sexual Activity  . Alcohol use: Yes    Comment: Wine, 1 per day  . Drug use: No  . Sexual activity: Never  Other Topics Concern  . Not on file  Social History Narrative   Married 1964   2 kids, in [place].  2 grandkids   Volunteered prev with [employer]   Enjoyed golf but gave it up after her husband had a stroke   Social Determinants of Radio broadcast assistant Strain: Low Risk   . Difficulty of Paying Living Expenses: Not hard at all  Food Insecurity: No Food Insecurity  . Worried About Charity fundraiser in the Last Year: Never true  . Ran Out of Food in the Last Year: Never true  Transportation Needs: No Transportation Needs  . Lack of Transportation (Medical): No  . Lack of Transportation (Non-Medical): No  Physical Activity: Inactive  . Days of Exercise per Week: 0 days  . Minutes of Exercise per Session: 0 min  Stress: No Stress Concern Present  . Feeling of Stress : Not at all  Social Connections: Not on file  Intimate Partner Violence: Not At Risk  . Fear of Current or Ex-Partner: No  . Emotionally Abused: No  . Physically Abused: No  . Sexually Abused: No     Physical Exam: BP (!) 190/90 (BP Location: Left Arm, Patient Position: Sitting, Cuff Size: Normal)   Pulse 80   Ht 5' 3.5" (1.613 m) Comment: height measured without shoes  Wt 226 lb (102.5 kg)   BMI 39.41 kg/m  Constitutional: generally well-appearing Psychiatric: alert and oriented x3 Abdomen: soft, nontender, nondistended, no obvious ascites, no peritoneal signs, normal bowel sounds No peripheral edema  noted in lower extremities  Assessment and plan: 75 y.o. female with personal history of precancerous colon polyps  We had a very nice discussion about colon cancer screening, polyp surveillance and the fact that current guidelines show that she is probably "due" for surveillance colonoscopy around now.  She is in  generally good health and I think that polyp surveillance, colon cancer screening is still a reasonable clinical concern for her and I recommended colonoscopy at her soonest convenience.  I see no reason for any further blood tests or imaging studies prior to then.  Please see the "Patient Instructions" section for addition details about the plan.  Owens Loffler, MD [facility] 10/18/2020, 2:21 PM   Total time on date of encounter was 25 minutes (this included time spent preparing to see the patient reviewing records; obtaining and/or reviewing separately obtained history; performing a medically appropriate exam and/or evaluation; counseling and educating the patient and family if present; ordering medications, tests or procedures if applicable; and documenting clinical information in the health record).

## 2020-11-01 DIAGNOSIS — H353221 Exudative age-related macular degeneration, left eye, with active choroidal neovascularization: Secondary | ICD-10-CM | POA: Diagnosis not present

## 2020-11-04 ENCOUNTER — Ambulatory Visit (AMBULATORY_SURGERY_CENTER): Payer: PPO | Admitting: Gastroenterology

## 2020-11-04 ENCOUNTER — Other Ambulatory Visit: Payer: Self-pay | Admitting: Gastroenterology

## 2020-11-04 ENCOUNTER — Encounter: Payer: Self-pay | Admitting: Gastroenterology

## 2020-11-04 ENCOUNTER — Other Ambulatory Visit: Payer: Self-pay

## 2020-11-04 VITALS — BP 132/56 | HR 77 | Temp 98.9°F | Resp 20 | Ht 63.0 in | Wt 226.0 lb

## 2020-11-04 DIAGNOSIS — Z86010 Personal history of colonic polyps: Secondary | ICD-10-CM | POA: Diagnosis not present

## 2020-11-04 DIAGNOSIS — D123 Benign neoplasm of transverse colon: Secondary | ICD-10-CM

## 2020-11-04 DIAGNOSIS — D122 Benign neoplasm of ascending colon: Secondary | ICD-10-CM | POA: Diagnosis not present

## 2020-11-04 MED ORDER — SODIUM CHLORIDE 0.9 % IV SOLN: 500.0000 mL | Freq: Once | INTRAVENOUS | Status: DC

## 2020-11-04 NOTE — Progress Notes (Signed)
Called to room to assist during endoscopic procedure.  Patient ID and intended procedure confirmed with present staff. Received instructions for my participation in the procedure from the performing physician.  

## 2020-11-04 NOTE — Progress Notes (Signed)
N.[place] vital signs. 

## 2020-11-04 NOTE — Progress Notes (Signed)
pt tolerated well. VSS. awake and to recovery. Report given to RN.  

## 2020-11-04 NOTE — Patient Instructions (Signed)
YOU HAD AN ENDOSCOPIC PROCEDURE TODAY AT THE [facility]:   Refer to the procedure report that was given to you for any specific questions about what was found during the examination.  If the procedure report does not answer your questions, please call your gastroenterologist to clarify.  If you requested that your care partner not be given the details of your procedure findings, then the procedure report has been included in a sealed envelope for you to review at your convenience later.  YOU SHOULD EXPECT: Some feelings of bloating in the abdomen. Passage of more gas than usual.  Walking can help get rid of the air that was put into your GI tract during the procedure and reduce the bloating. If you had a lower endoscopy (such as a colonoscopy or flexible sigmoidoscopy) you may notice spotting of blood in your stool or on the toilet paper. If you underwent a bowel prep for your procedure, you may not have a normal bowel movement for a few days.  Please Note:  You might notice some irritation and congestion in your nose or some drainage.  This is from the oxygen used during your procedure.  There is no need for concern and it should clear up in a day or so.  SYMPTOMS TO REPORT IMMEDIATELY:   Following lower endoscopy (colonoscopy or flexible sigmoidoscopy):  Excessive amounts of blood in the stool  Significant tenderness or worsening of abdominal pains  Swelling of the abdomen that is new, acute  Fever of 100F or higher   Following upper endoscopy (EGD)  Vomiting of blood or coffee ground material  New chest pain or pain under the shoulder blades  Painful or persistently difficult swallowing  New shortness of breath  Fever of 100F or higher  Black, tarry-looking stools  For urgent or emergent issues, a gastroenterologist can be reached at any hour by calling [phone]. Do not use MyChart messaging for urgent concerns.    DIET:  We do recommend a small meal at first, but  then you may proceed to your regular diet.  Drink plenty of fluids but you should avoid alcoholic beverages for 24 hours.  ACTIVITY:  You should plan to take it easy for the rest of today and you should NOT DRIVE or use heavy machinery until tomorrow (because of the sedation medicines used during the test).    FOLLOW UP: Our staff will call the number listed on your records 48-72 hours following your procedure to check on you and address any questions or concerns that you may have regarding the information given to you following your procedure. If we do not reach you, we will leave a message.  We will attempt to reach you two times.  During this call, we will ask if you have developed any symptoms of COVID 19. If you develop any symptoms (ie: fever, flu-like symptoms, shortness of breath, cough etc.) before then, please call [phone].  If you test positive for Covid 19 in the 2 weeks post procedure, please call and report this information to us.    If any biopsies were taken you will be contacted by phone or by letter within the next 1-3 weeks.  Please call us at [phone] if you have not heard about the biopsies in 3 weeks.    SIGNATURES/CONFIDENTIALITY: You and/or your care partner have signed paperwork which will be entered into your electronic medical record.  These signatures attest to the fact that that the information above on   your After Visit Summary has been reviewed and is understood.  Full responsibility of the confidentiality of this discharge information lies with you and/or your care-partner. 

## 2020-11-04 NOTE — Op Note (Signed)
[place] Patient Name: Mercedes [name] Procedure Date: 11/04/2020 2:23 PM MRN: [id number] Endoscopist: Milus Banister , MD Age: 75 Referring MD:  Date of Birth: 1945-10-03 Gender: Female Account #: [id number] Procedure:                Colonoscopy Indications:              High risk colon cancer surveillance: Personal                            history of colonic polyps; 2015 colonoscopy 6                            adenomas, including one that was 1.2cm. Colonoscopy                            September 2018 3 subcentimeter polyps were found                            and removed. These were mixed adenomatous and                            sessile serrated polyps. Medicines:                Monitored Anesthesia Care Procedure:                Pre-Anesthesia Assessment:                           - Prior to the procedure, a History and Physical                            was performed, and patient medications and                            allergies were reviewed. The patient's tolerance of                            previous anesthesia was also reviewed. The risks                            and benefits of the procedure and the sedation                            options and risks were discussed with the patient.                            All questions were answered, and informed consent                            was obtained. Prior Anticoagulants: The patient has                            taken no previous anticoagulant or antiplatelet  agents. ASA Grade Assessment: II - A patient with                            mild systemic disease. After reviewing the risks                            and benefits, the patient was deemed in                            satisfactory condition to undergo the procedure.                           After obtaining informed consent, the colonoscope                            was passed under direct vision. Throughout the                             procedure, the patient's blood pressure, pulse, and                            oxygen saturations were monitored continuously. The                            Olympus CF-HQ190L (Serial# [id number]) Colonoscope was                            introduced through the anus and advanced to the the                            cecum, identified by appendiceal orifice and                            ileocecal valve. The colonoscopy was performed                            without difficulty. The patient tolerated the                            procedure well. The quality of the bowel                            preparation was good. The ileocecal valve,                            appendiceal orifice, and rectum were photographed. Scope In: 2:32:47 PM Scope Out: 2:47:07 PM Scope Withdrawal Time: 0 hours 10 minutes 3 seconds  Total Procedure Duration: 0 hours 14 minutes 20 seconds  Findings:                 Five sessile polyps were found in the transverse                            colon and ascending colon. The polyps were 2  to 6                            mm in size. These polyps were removed with a cold                            snare. Resection and retrieval were complete.                           Multiple small and large-mouthed diverticula were                            found in the left colon, with associated edema,                            luminal narrowing and tortuosity.                           The exam was otherwise without abnormality on                            direct and retroflexion views. Complications:            No immediate complications. Estimated blood loss:                            None. Estimated Blood Loss:     Estimated blood loss: none. Impression:               - Five 2 to 6 mm polyps in the transverse colon and                            in the ascending colon, removed with a cold snare.                            Resected and retrieved.                            - Diverticulosis in the left colon.                           - The examination was otherwise normal on direct                            and retroflexion views. Recommendation:           - Patient has a contact number available for                            emergencies. The signs and symptoms of potential                            delayed complications were discussed with the                            patient. Return to normal activities tomorrow.  Written discharge instructions were provided to the                            patient.                           - Resume previous diet.                           - Continue present medications.                           - Await pathology results. Milus Banister, MD 11/04/2020 2:50:38 PM This report has been signed electronically.

## 2020-11-04 NOTE — Progress Notes (Signed)
Pt's states no medical or surgical changes since previsit or office visit. 

## 2020-11-08 ENCOUNTER — Telehealth: Payer: Self-pay | Admitting: *Deleted

## 2020-11-08 NOTE — Telephone Encounter (Signed)
  Follow up Call-  Call back number 11/04/2020  Post procedure Call Back phone  # [phone]  Permission to leave phone message Yes  Some recent data might be hidden     Patient questions:  Do you have a fever, pain , or abdominal swelling? No. Pain Score  0 *  Have you tolerated food without any problems? Yes.    Have you been able to return to your normal activities? Yes.    Do you have any questions about your discharge instructions: Diet   No. Medications  No. Follow up visit  No.  Do you have questions or concerns about your Care? No.  Actions: * If pain score is 4 or above: No action needed, pain <4

## 2020-11-17 ENCOUNTER — Encounter: Payer: Self-pay | Admitting: Gastroenterology

## 2020-11-29 DIAGNOSIS — H353221 Exudative age-related macular degeneration, left eye, with active choroidal neovascularization: Secondary | ICD-10-CM | POA: Diagnosis not present

## 2020-12-05 ENCOUNTER — Ambulatory Visit (INDEPENDENT_AMBULATORY_CARE_PROVIDER_SITE_OTHER): Payer: PPO | Admitting: Family Medicine

## 2020-12-05 ENCOUNTER — Ambulatory Visit (INDEPENDENT_AMBULATORY_CARE_PROVIDER_SITE_OTHER)
Admission: RE | Admit: 2020-12-05 | Discharge: 2020-12-05 | Disposition: A | Discharge status: Home / Self Care | Payer: PPO | Source: Ambulatory Visit | Attending: Family Medicine | Admitting: Family Medicine

## 2020-12-05 ENCOUNTER — Encounter: Payer: Self-pay | Admitting: Family Medicine

## 2020-12-05 ENCOUNTER — Other Ambulatory Visit: Payer: Self-pay

## 2020-12-05 VITALS — BP 134/80 | HR 96 | Temp 97.6°F | Ht 63.0 in | Wt 222.0 lb

## 2020-12-05 DIAGNOSIS — Z7189 Other specified counseling: Secondary | ICD-10-CM

## 2020-12-05 DIAGNOSIS — M109 Gout, unspecified: Secondary | ICD-10-CM

## 2020-12-05 DIAGNOSIS — M545 Low back pain, unspecified: Secondary | ICD-10-CM

## 2020-12-05 DIAGNOSIS — E559 Vitamin D deficiency, unspecified: Secondary | ICD-10-CM

## 2020-12-05 DIAGNOSIS — E785 Hyperlipidemia, unspecified: Secondary | ICD-10-CM | POA: Diagnosis not present

## 2020-12-05 DIAGNOSIS — I10 Essential (primary) hypertension: Secondary | ICD-10-CM | POA: Diagnosis not present

## 2020-12-05 DIAGNOSIS — Z0000 Encounter for general adult medical examination without abnormal findings: Secondary | ICD-10-CM

## 2020-12-05 DIAGNOSIS — R739 Hyperglycemia, unspecified: Secondary | ICD-10-CM | POA: Diagnosis not present

## 2020-12-05 DIAGNOSIS — E038 Other specified hypothyroidism: Secondary | ICD-10-CM

## 2020-12-05 LAB — CBC WITH DIFFERENTIAL/PLATELET
Basophils Absolute: 0 10*3/uL (ref 0.0–0.1)
Basophils Relative: 0.8 % (ref 0.0–3.0)
Eosinophils Absolute: 0.1 10*3/uL (ref 0.0–0.7)
Eosinophils Relative: 1.8 % (ref 0.0–5.0)
HCT: 43.2 % (ref 36.0–46.0)
Hemoglobin: 14.9 g/dL (ref 12.0–15.0)
Lymphocytes Relative: 38.9 % (ref 12.0–46.0)
Lymphs Abs: 2.3 10*3/uL (ref 0.7–4.0)
MCHC: 34.5 g/dL (ref 30.0–36.0)
MCV: 97.3 fl (ref 78.0–100.0)
Monocytes Absolute: 0.6 10*3/uL (ref 0.1–1.0)
Monocytes Relative: 10 % (ref 3.0–12.0)
Neutro Abs: 2.9 10*3/uL (ref 1.4–7.7)
Neutrophils Relative %: 48.5 % (ref 43.0–77.0)
Platelets: 206 10*3/uL (ref 150.0–400.0)
RBC: 4.44 Mil/uL (ref 3.87–5.11)
RDW: 12.9 % (ref 11.5–15.5)
WBC: 6 10*3/uL (ref 4.0–10.5)

## 2020-12-05 LAB — HEMOGLOBIN A1C: Hgb A1c MFr Bld: 6 % (ref 4.6–6.5)

## 2020-12-05 LAB — TSH: TSH: 1.08 u[IU]/mL (ref 0.35–4.50)

## 2020-12-05 LAB — VITAMIN D 25 HYDROXY (VIT D DEFICIENCY, FRACTURES): VITD: 38.54 ng/mL (ref 30.00–100.00)

## 2020-12-05 NOTE — Patient Instructions (Addendum)
Go to the lab on the way out.   If you have mychart we'll likely use that to update you.    Take care.  Glad to see you. 

## 2020-12-05 NOTE — Progress Notes (Signed)
This visit occurred during the SARS-CoV-2 public health emergency.  Safety protocols were in place, including screening questions prior to the visit, additional usage of staff PPE, and extensive cleaning of exam room while observing appropriate contact time as indicated for disinfecting solutions.  She had noted recently confusion with her husband.  I will discuss with her husband.  colonoscopy 2022 DXA 2021 Mammogram 2021.  Advance directive- sons Remo Lipps and Shanon Brow equally designated if patient were incapacitated. Vaccines up to date.  She can get f/u covid booster.    She is putting up with her back at baseline.  She can still be active but with discomfort.  She has done PT prev with some relief.  L lower back usually, not in midline.  Can be as low at the L SI joint.  Prev with radiation down the L leg, that got better with PT.  Still with occ tingling in the L leg.    History of gout.  No recent colchine use.  Uric acid pending.  See notes on labs.  Hypothyroidism.  TSh pending.  No neck mass or lumps.  Compliant with levothyroxine.  Hypertension:    Using medication without problems or lightheadedness:  yes Chest pain with exertion:no Edema: rare, mild, with prolonged sitting.   Short of breath: mild SOB with exertion but with quick recovery.    Elevated Cholesterol: Using medications without problems: yes Muscle aches: no Diet compliance: yes Exercise: encouraged.   Labs pending.  History of hypercalcemia.  F/u PTH pending.  D/w pt. see notes on labs.  Meds, vitals, and allergies reviewed.   PMH and SH reviewed  ROS: Per HPI unless specifically indicated in ROS section   GEN: nad, alert and oriented HEENT: ncat NECK: supple w/o LA CV: rrr. PULM: ctab, no inc wob ABD: soft, +bs EXT: no edema SKIN: no acute rash L lower back ttp, midline not ttp.  L SLR neg.

## 2020-12-06 LAB — COMPREHENSIVE METABOLIC PANEL
ALT: 39 U/L — ABNORMAL HIGH (ref 0–35)
AST: 41 U/L — ABNORMAL HIGH (ref 0–37)
Albumin: 4.2 g/dL (ref 3.5–5.2)
Alkaline Phosphatase: 70 U/L (ref 39–117)
BUN: 23 mg/dL (ref 6–23)
CO2: 27 mEq/L (ref 19–32)
Calcium: 10.3 mg/dL (ref 8.4–10.5)
Chloride: 104 mEq/L (ref 96–112)
Creatinine, Ser: 0.85 mg/dL (ref 0.40–1.20)
GFR: 66.18 mL/min (ref >60.00)
Glucose, Bld: 156 mg/dL — ABNORMAL HIGH (ref 70–99)
Potassium: 4.7 mEq/L (ref 3.5–5.1)
Sodium: 142 mEq/L (ref 135–145)
Total Bilirubin: 1 mg/dL (ref 0.2–1.2)
Total Protein: 7.1 g/dL (ref 6.0–8.3)

## 2020-12-06 LAB — PTH, INTACT AND CALCIUM
Calcium: 10.3 mg/dL (ref 8.6–10.4)
PTH: 114 pg/mL — ABNORMAL HIGH (ref 16–77)

## 2020-12-06 LAB — LIPID PANEL
Cholesterol: 172 mg/dL (ref 0–200)
HDL: 40 mg/dL (ref >39.00)
NonHDL: 132
Total CHOL/HDL Ratio: 4
Triglycerides: 251 mg/dL — ABNORMAL HIGH (ref 0.0–149.0)
VLDL: 50.2 mg/dL — ABNORMAL HIGH (ref 0.0–40.0)

## 2020-12-06 LAB — LDL CHOLESTEROL, DIRECT: Direct LDL: 101 mg/dL

## 2020-12-06 LAB — URIC ACID: Uric Acid, Serum: 8.1 mg/dL — ABNORMAL HIGH (ref 2.4–7.0)

## 2020-12-07 ENCOUNTER — Other Ambulatory Visit: Payer: Self-pay | Admitting: Family Medicine

## 2020-12-07 DIAGNOSIS — R7989 Other specified abnormal findings of blood chemistry: Secondary | ICD-10-CM

## 2020-12-07 DIAGNOSIS — M545 Low back pain, unspecified: Secondary | ICD-10-CM

## 2020-12-07 NOTE — Assessment & Plan Note (Signed)
Prev with radiation down the L leg, that got better with PT.  Still with occ tingling in the L leg.   Reasonable to check plain films.  See notes on imaging.

## 2020-12-07 NOTE — Assessment & Plan Note (Signed)
No recent colchine use.  Uric acid pending.  See notes on labs.

## 2020-12-07 NOTE — Assessment & Plan Note (Signed)
colonoscopy 2022 DXA 2021 Mammogram 2021.  Advance directive- sons Remo Lipps and Shanon Brow equally designated if patient were incapacitated. Vaccines up to date.  She can get f/u covid booster.

## 2020-12-07 NOTE — Assessment & Plan Note (Signed)
TSh pending.  No neck mass or lumps.

## 2020-12-07 NOTE — Assessment & Plan Note (Signed)
Continue metoprolol.  See notes on labs.

## 2020-12-07 NOTE — Assessment & Plan Note (Signed)
Follow-up PTH and labs pending.  See notes on labs.

## 2020-12-07 NOTE — Assessment & Plan Note (Signed)
Continue pravastatin.  Continue work on diet and exercise.  See notes on labs.

## 2020-12-07 NOTE — Assessment & Plan Note (Signed)
Advance directive- sons Remo Lipps and Shanon Brow equally designated if patient were incapacitated.

## 2020-12-11 ENCOUNTER — Other Ambulatory Visit: Payer: Self-pay | Admitting: Family Medicine

## 2020-12-11 DIAGNOSIS — E210 Primary hyperparathyroidism: Secondary | ICD-10-CM | POA: Insufficient documentation

## 2020-12-22 DIAGNOSIS — Z20822 Contact with and (suspected) exposure to covid-19: Secondary | ICD-10-CM | POA: Diagnosis not present

## 2020-12-27 DIAGNOSIS — H353221 Exudative age-related macular degeneration, left eye, with active choroidal neovascularization: Secondary | ICD-10-CM | POA: Diagnosis not present

## 2021-01-06 ENCOUNTER — Encounter: Payer: Self-pay | Admitting: Family Medicine

## 2021-01-06 ENCOUNTER — Ambulatory Visit (INDEPENDENT_AMBULATORY_CARE_PROVIDER_SITE_OTHER): Payer: PPO | Admitting: Family Medicine

## 2021-01-06 ENCOUNTER — Other Ambulatory Visit: Payer: Self-pay

## 2021-01-06 VITALS — BP 152/74 | HR 92 | Temp 98.4°F | Ht 63.0 in | Wt 224.0 lb

## 2021-01-06 DIAGNOSIS — R350 Frequency of micturition: Secondary | ICD-10-CM | POA: Insufficient documentation

## 2021-01-06 DIAGNOSIS — N898 Other specified noninflammatory disorders of vagina: Secondary | ICD-10-CM | POA: Insufficient documentation

## 2021-01-06 LAB — POCT WET PREP (WET MOUNT): Trichomonas Wet Prep HPF POC: ABSENT

## 2021-01-06 LAB — POCT URINALYSIS DIP (MANUAL ENTRY)
Bilirubin, UA: NEGATIVE
Glucose, UA: NEGATIVE mg/dL
Ketones, POC UA: NEGATIVE mg/dL
Nitrite, UA: NEGATIVE
Spec Grav, UA: 1.025 (ref 1.010–1.025)
Urobilinogen, UA: 0.2 E.U./dL
pH, UA: 6 (ref 5.0–8.0)

## 2021-01-06 MED ORDER — CEPHALEXIN 500 MG PO CAPS: 500.0000 mg | ORAL_CAPSULE | Freq: Three times a day (TID) | ORAL | 0 refills | Status: DC

## 2021-01-06 NOTE — Patient Instructions (Signed)
Complete antibiotics for urinary infection.  We will call with culture results.  If vaginal discharge not improving.. call for possible uterine US or further evaluation.

## 2021-01-06 NOTE — Progress Notes (Signed)
Patient ID: Mercedes [name], female    DOB: 1945-10-12, 75 y.o.   MRN: [id number]  This visit was conducted in person.  BP (!) 152/74   Pulse 92   Temp 98.4 F (36.9 C) (Temporal)   Ht 5\' 3"  (1.6 m)   Wt 224 lb (101.6 kg)   SpO2 96%   BMI 39.68 kg/m    Chief Complaint  Patient presents with   Urinary Frequency   Urinary Tract Infection    Subjective:   HPI: Mercedes [name] is a 75 y.o. female presenting on 01/06/2021 for Urinary Frequency and Urinary Tract Infection   Seen in 06/2020 for UTI.. Culture grew Ecoli.   Treated with nitrofurantoin.  She had improvement in urgency.  Since then it still feels funny/uncomfortable to urinate. No  dysuria.  She fees air escapes when she urinates. Never really went back to normal.   Now in last 3-4 weeks she has noted increase in urgency.  Has noted more issues at night, urinating 3 times at night.  No dysuria. When wiping she she notes brownish discharge.  No change in color of urine.   She still has uterus and ovaries.  No low abd pain.  Had tried a bidet.. still has issue when not using.  No past Urological issue. No urologic procedure.   Relevant past medical, surgical, family and social history reviewed and updated as indicated. Interim medical history since our last visit reviewed. Allergies and medications reviewed and updated. Outpatient Medications Prior to Visit  Medication Sig Dispense Refill   Aflibercept (EYLEA) 2 MG/0.05ML SOLN Place 2 mg into the left eye every 30 (thirty) days.     cholecalciferol (VITAMIN D) 1000 units tablet Take 1,000 Units by mouth 2 (two) times a day.      colchicine 0.6 MG tablet Take 1 tablet by mouth twice daily as needed 60 tablet 0   levothyroxine (SYNTHROID) 200 MCG tablet TAKE 1 TABLET BY MOUTH ONCE DAILY BEFORE BREAKFAST 90 tablet 3   levothyroxine (SYNTHROID) 25 MCG tablet Take 1 tablet (25 mcg total) by mouth daily before breakfast. On Monday through Fridays.  Skip on  Saturday and Sunday. 90 tablet 3   metoprolol (TOPROL-XL) 200 MG 24 hr tablet Take 1 tablet (200 mg total) by mouth every morning. 90 tablet 3   Multiple Vitamin (MULTIVITAMIN WITH MINERALS) TABS tablet Take 1 tablet by mouth daily.     Multiple Vitamins-Minerals (PRESERVISION AREDS 2) CAPS Take 1 capsule by mouth 2 (two) times daily.     naproxen sodium (ALEVE) 220 MG tablet Take 220 mg by mouth in the morning and at bedtime.     Omega 3 1000 MG CAPS Take 2,000 mg by mouth daily.     Potassium 99 MG TABS Take 99 mg by mouth every morning.     pravastatin (PRAVACHOL) 40 MG tablet Take 1 tablet (40 mg total) by mouth daily. 90 tablet 3   psyllium (REGULOID) 0.52 G capsule Take 0.52 g by mouth daily. Two tablets per day     No facility-administered medications prior to visit.     Per HPI unless specifically indicated in ROS section below Review of Systems  Constitutional:  Negative for fatigue and fever.  HENT:  Negative for congestion.   Eyes:  Negative for pain.  Respiratory:  Negative for cough and shortness of breath.   Cardiovascular:  Negative for chest pain, palpitations and leg swelling.  Gastrointestinal:  Negative for abdominal pain.  Genitourinary:  Positive for frequency. Negative for dysuria and vaginal bleeding.  Musculoskeletal:  Negative for back pain.  Neurological:  Negative for syncope, light-headedness and headaches.  Psychiatric/Behavioral:  Negative for dysphoric mood.   Objective:  BP (!) 152/74   Pulse 92   Temp 98.4 F (36.9 C) (Temporal)   Ht 5\' 3"  (1.6 m)   Wt 224 lb (101.6 kg)   SpO2 96%   BMI 39.68 kg/m   Wt Readings from Last 3 Encounters:  01/06/21 224 lb (101.6 kg)  12/05/20 222 lb (100.7 kg)  11/04/20 226 lb (102.5 kg)      Physical Exam Constitutional:      General: She is not in acute distress.    Appearance: Normal appearance. She is well-developed. She is not ill-appearing or toxic-appearing.  HENT:     Head: Normocephalic.     Right  Ear: Hearing, tympanic membrane, ear canal and external ear normal. Tympanic membrane is not erythematous, retracted or bulging.     Left Ear: Hearing, tympanic membrane, ear canal and external ear normal. Tympanic membrane is not erythematous, retracted or bulging.     Nose: No mucosal edema or rhinorrhea.     Right Sinus: No maxillary sinus tenderness or frontal sinus tenderness.     Left Sinus: No maxillary sinus tenderness or frontal sinus tenderness.     Mouth/Throat:     Pharynx: Uvula midline.  Eyes:     General: Lids are normal. Lids are everted, no foreign bodies appreciated.     Conjunctiva/sclera: Conjunctivae normal.     Pupils: Pupils are equal, round, and reactive to light.  Neck:     Thyroid: No thyroid mass or thyromegaly.     Vascular: No carotid bruit.     Trachea: Trachea normal.  Cardiovascular:     Rate and Rhythm: Normal rate and regular rhythm.     Pulses: Normal pulses.     Heart sounds: Normal heart sounds, S1 normal and S2 normal. No murmur heard.   No friction rub. No gallop.  Pulmonary:     Effort: Pulmonary effort is normal. No tachypnea or respiratory distress.     Breath sounds: Normal breath sounds. No decreased breath sounds, wheezing, rhonchi or rales.  Abdominal:     General: Bowel sounds are normal.     Palpations: Abdomen is soft.     Tenderness: There is no abdominal tenderness.  Genitourinary:    Labia:        Right: No rash.        Left: No rash.      Comments: small amount of yellowish discharge in vaginal vault.. no bleeding  No urethral lesion or abrasion vaginally Musculoskeletal:     Cervical back: Normal range of motion and neck supple.  Skin:    General: Skin is warm and dry.     Findings: No rash.  Neurological:     Mental Status: She is alert.  Psychiatric:        Mood and Affect: Mood is not anxious or depressed.        Speech: Speech normal.        Behavior: Behavior normal. Behavior is cooperative.        Thought  Content: Thought content normal.        Judgment: Judgment normal.      Results for orders placed or performed in visit on 01/06/21  POCT urinalysis dipstick  Result Value Ref Range   Color, UA yellow yellow  Clarity, UA cloudy (A) clear   Glucose, UA negative negative mg/dL   Bilirubin, UA negative negative   Ketones, POC UA negative negative mg/dL   Spec Grav, UA 1.025 1.010 - 1.025   Blood, UA trace-intact (A) negative   pH, UA 6.0 5.0 - 8.0   Protein Ur, POC trace (A) negative mg/dL   Urobilinogen, UA 0.2 0.2 or 1.0 E.U./dL   Nitrite, UA Negative Negative   Leukocytes, UA Large (3+) (A) Negative    This visit occurred during the SARS-CoV-2 public health emergency.  Safety protocols were in place, including screening questions prior to the visit, additional usage of staff PPE, and extensive cleaning of exam room while observing appropriate contact time as indicated for disinfecting solutions.   COVID 19 screen:  No recent travel or known exposure to COVID19 The patient denies respiratory symptoms of COVID 19 at this time. The importance of social distancing was discussed today.   Assessment and Plan    Problem List Items Addressed This Visit     Frequency of urination - Primary    UA consistent with UTI.Marland Kitchen will send for culture.  Will treat with keflex.  Increase water intake.       Relevant Orders   POCT urinalysis dipstick (Completed)   Urine Culture   Vaginal discharge     Not sure if vaginal or urinary per pt... did see small amount of yellow non-bloody discharge in vaginal vault. Neg wet prep.    No clear sign of uterine enlargement ot uterine bleeding.   If does not resolved with treatment of UTI.. consider further eval to rule out endometrial carcinoma with [place].       Relevant Orders   POCT Wet Prep [facility] [place]) (Completed)     Eliezer Lofts, MD

## 2021-01-06 NOTE — Assessment & Plan Note (Signed)
Not sure if vaginal or urinary per pt... did see small amount of yellow non-bloody discharge in vaginal vault. Neg wet prep.    No clear sign of uterine enlargement ot uterine bleeding.   If does not resolved with treatment of UTI.. consider further eval to rule out endometrial carcinoma with [place].

## 2021-01-06 NOTE — Assessment & Plan Note (Signed)
UA consistent with UTI.Marland Kitchen will send for culture.  Will treat with keflex.  Increase water intake.

## 2021-01-07 LAB — URINE CULTURE
MICRO NUMBER:: 12097175
SPECIMEN QUALITY:: ADEQUATE

## 2021-01-13 ENCOUNTER — Other Ambulatory Visit: Payer: Self-pay

## 2021-01-13 ENCOUNTER — Ambulatory Visit
Admission: RE | Admit: 2021-01-13 | Discharge: 2021-01-13 | Disposition: A | Discharge status: Home / Self Care | Payer: PPO | Source: Ambulatory Visit | Attending: Family Medicine | Admitting: Family Medicine

## 2021-01-13 DIAGNOSIS — R7989 Other specified abnormal findings of blood chemistry: Secondary | ICD-10-CM | POA: Diagnosis not present

## 2021-01-13 DIAGNOSIS — K760 Fatty (change of) liver, not elsewhere classified: Secondary | ICD-10-CM | POA: Diagnosis not present

## 2021-01-15 ENCOUNTER — Other Ambulatory Visit: Payer: Self-pay | Admitting: Family Medicine

## 2021-01-16 ENCOUNTER — Other Ambulatory Visit: Payer: Self-pay | Admitting: Family Medicine

## 2021-01-16 DIAGNOSIS — R7989 Other specified abnormal findings of blood chemistry: Secondary | ICD-10-CM

## 2021-01-17 ENCOUNTER — Ambulatory Visit: Payer: PPO | Attending: Internal Medicine

## 2021-01-17 ENCOUNTER — Other Ambulatory Visit: Payer: Self-pay

## 2021-01-17 DIAGNOSIS — Z23 Encounter for immunization: Secondary | ICD-10-CM

## 2021-01-17 MED ORDER — COVID-19 MRNA VAC-TRIS(PFIZER) 30 MCG/0.3ML IM SUSP
INTRAMUSCULAR | 0 refills | Status: DC
  Filled 2021-01-17: qty 0.3, 30d supply, fill #0

## 2021-01-17 NOTE — Progress Notes (Signed)
   Covid-19 Vaccination Clinic  Name:  Mercedes [name]    MRN: [id number] DOB: 02/15/1946  01/17/2021  [name] was observed post Covid-19 immunization for 15 minutes without incident. She was provided with Vaccine Information Sheet and instruction to access the V-Safe system.   [name] was instructed to call 911 with any severe reactions post vaccine: Difficulty breathing  Swelling of face and throat  A fast heartbeat  A bad rash all over body  Dizziness and weakness   Immunizations Administered     Name Date Dose VIS Date Route   PFIZER Comrnaty(Gray TOP) Covid-19 Vaccine 01/17/2021 10:59 AM 0.3 mL 06/09/2020 Intramuscular   Manufacturer: [place]   Lot: [id number]   [place]: [phone]       Covid-19 Vaccination Clinic  Name:  Mercedes [name]    MRN: [id number] DOB: 03-14-1946  01/17/2021  [name] was observed post Covid-19 immunization for 15 minutes without incident. She was provided with Vaccine Information Sheet and instruction to access the V-Safe system.   [name] was instructed to call 911 with any severe reactions post vaccine: Difficulty breathing  Swelling of face and throat  A fast heartbeat  A bad rash all over body  Dizziness and weakness   Immunizations Administered     Name Date Dose VIS Date Route   PFIZER Comrnaty(Gray TOP) Covid-19 Vaccine 01/17/2021 10:59 AM 0.3 mL 06/09/2020 Intramuscular   Manufacturer: [place]   Lot: [id number]   [place]: [phone]

## 2021-01-24 DIAGNOSIS — H353221 Exudative age-related macular degeneration, left eye, with active choroidal neovascularization: Secondary | ICD-10-CM | POA: Diagnosis not present

## 2021-01-31 DIAGNOSIS — M4316 Spondylolisthesis, lumbar region: Secondary | ICD-10-CM | POA: Diagnosis not present

## 2021-01-31 DIAGNOSIS — M5137 Other intervertebral disc degeneration, lumbosacral region: Secondary | ICD-10-CM | POA: Diagnosis not present

## 2021-01-31 DIAGNOSIS — M5136 Other intervertebral disc degeneration, lumbar region: Secondary | ICD-10-CM | POA: Diagnosis not present

## 2021-01-31 DIAGNOSIS — G8929 Other chronic pain: Secondary | ICD-10-CM | POA: Diagnosis not present

## 2021-01-31 DIAGNOSIS — M545 Low back pain, unspecified: Secondary | ICD-10-CM | POA: Diagnosis not present

## 2021-01-31 DIAGNOSIS — M5416 Radiculopathy, lumbar region: Secondary | ICD-10-CM | POA: Diagnosis not present

## 2021-01-31 DIAGNOSIS — M2578 Osteophyte, vertebrae: Secondary | ICD-10-CM | POA: Diagnosis not present

## 2021-02-06 ENCOUNTER — Other Ambulatory Visit: Payer: Self-pay | Admitting: Neurosurgery

## 2021-02-06 DIAGNOSIS — M5416 Radiculopathy, lumbar region: Secondary | ICD-10-CM

## 2021-02-13 ENCOUNTER — Ambulatory Visit
Admission: RE | Admit: 2021-02-13 | Discharge: 2021-02-13 | Disposition: A | Discharge status: Home / Self Care | Payer: PPO | Source: Ambulatory Visit | Attending: Neurosurgery | Admitting: Neurosurgery

## 2021-02-13 ENCOUNTER — Other Ambulatory Visit: Payer: Self-pay

## 2021-02-13 DIAGNOSIS — M5416 Radiculopathy, lumbar region: Secondary | ICD-10-CM | POA: Diagnosis not present

## 2021-02-13 DIAGNOSIS — M545 Low back pain, unspecified: Secondary | ICD-10-CM | POA: Diagnosis not present

## 2021-02-21 DIAGNOSIS — H353221 Exudative age-related macular degeneration, left eye, with active choroidal neovascularization: Secondary | ICD-10-CM | POA: Diagnosis not present

## 2021-02-28 DIAGNOSIS — M5136 Other intervertebral disc degeneration, lumbar region: Secondary | ICD-10-CM | POA: Diagnosis not present

## 2021-02-28 DIAGNOSIS — M5416 Radiculopathy, lumbar region: Secondary | ICD-10-CM | POA: Diagnosis not present

## 2021-02-28 DIAGNOSIS — M5126 Other intervertebral disc displacement, lumbar region: Secondary | ICD-10-CM | POA: Diagnosis not present

## 2021-02-28 DIAGNOSIS — M48062 Spinal stenosis, lumbar region with neurogenic claudication: Secondary | ICD-10-CM | POA: Diagnosis not present

## 2021-03-14 DIAGNOSIS — M5416 Radiculopathy, lumbar region: Secondary | ICD-10-CM | POA: Diagnosis not present

## 2021-03-14 DIAGNOSIS — M48062 Spinal stenosis, lumbar region with neurogenic claudication: Secondary | ICD-10-CM | POA: Diagnosis not present

## 2021-03-17 ENCOUNTER — Other Ambulatory Visit: Payer: Self-pay

## 2021-03-17 ENCOUNTER — Ambulatory Visit (INDEPENDENT_AMBULATORY_CARE_PROVIDER_SITE_OTHER): Payer: PPO

## 2021-03-17 DIAGNOSIS — Z23 Encounter for immunization: Secondary | ICD-10-CM

## 2021-03-21 DIAGNOSIS — H353221 Exudative age-related macular degeneration, left eye, with active choroidal neovascularization: Secondary | ICD-10-CM | POA: Diagnosis not present

## 2021-04-05 DIAGNOSIS — M5126 Other intervertebral disc displacement, lumbar region: Secondary | ICD-10-CM | POA: Diagnosis not present

## 2021-04-05 DIAGNOSIS — M5416 Radiculopathy, lumbar region: Secondary | ICD-10-CM | POA: Diagnosis not present

## 2021-04-05 DIAGNOSIS — M5136 Other intervertebral disc degeneration, lumbar region: Secondary | ICD-10-CM | POA: Diagnosis not present

## 2021-04-05 DIAGNOSIS — M48062 Spinal stenosis, lumbar region with neurogenic claudication: Secondary | ICD-10-CM | POA: Diagnosis not present

## 2021-04-11 ENCOUNTER — Other Ambulatory Visit: Payer: Self-pay | Admitting: Family Medicine

## 2021-04-11 DIAGNOSIS — M6281 Muscle weakness (generalized): Secondary | ICD-10-CM | POA: Diagnosis not present

## 2021-04-11 DIAGNOSIS — M545 Low back pain, unspecified: Secondary | ICD-10-CM | POA: Diagnosis not present

## 2021-04-11 DIAGNOSIS — M5416 Radiculopathy, lumbar region: Secondary | ICD-10-CM | POA: Diagnosis not present

## 2021-04-20 ENCOUNTER — Encounter: Payer: Self-pay | Admitting: General Surgery

## 2021-04-24 DIAGNOSIS — H353112 Nonexudative age-related macular degeneration, right eye, intermediate dry stage: Secondary | ICD-10-CM | POA: Diagnosis not present

## 2021-04-24 DIAGNOSIS — H353221 Exudative age-related macular degeneration, left eye, with active choroidal neovascularization: Secondary | ICD-10-CM | POA: Diagnosis not present

## 2021-04-25 DIAGNOSIS — S0502XA Injury of conjunctiva and corneal abrasion without foreign body, left eye, initial encounter: Secondary | ICD-10-CM | POA: Diagnosis not present

## 2021-04-28 DIAGNOSIS — S0502XD Injury of conjunctiva and corneal abrasion without foreign body, left eye, subsequent encounter: Secondary | ICD-10-CM | POA: Diagnosis not present

## 2021-05-01 DIAGNOSIS — M6281 Muscle weakness (generalized): Secondary | ICD-10-CM | POA: Diagnosis not present

## 2021-05-01 DIAGNOSIS — M545 Low back pain, unspecified: Secondary | ICD-10-CM | POA: Diagnosis not present

## 2021-05-01 DIAGNOSIS — M5416 Radiculopathy, lumbar region: Secondary | ICD-10-CM | POA: Diagnosis not present

## 2021-05-02 ENCOUNTER — Other Ambulatory Visit: Payer: Self-pay

## 2021-05-02 ENCOUNTER — Ambulatory Visit: Payer: PPO | Attending: Internal Medicine

## 2021-05-02 DIAGNOSIS — Z23 Encounter for immunization: Secondary | ICD-10-CM

## 2021-05-02 MED ORDER — PFIZER COVID-19 VAC BIVALENT 30 MCG/0.3ML IM SUSP
INTRAMUSCULAR | 0 refills | Status: DC
  Filled 2021-05-02: qty 0.3, 1d supply, fill #0

## 2021-05-02 NOTE — Progress Notes (Signed)
   Covid-19 Vaccination Clinic  Name:  Mercedes [name]    MRN: [id number] DOB: 05/07/1946  05/02/2021  [name] was observed post Covid-19 immunization for 15 minutes without incident. She was provided with Vaccine Information Sheet and instruction to access the V-Safe system.   [name] was instructed to call 911 with any severe reactions post vaccine: Difficulty breathing  Swelling of face and throat  A fast heartbeat  A bad rash all over body  Dizziness and weakness   Immunizations Administered     Name Date Dose VIS Date Route   Pfizer Covid-19 Vaccine Bivalent Booster 05/02/2021 10:45 AM 0.3 mL 03/01/2021 Intramuscular   Manufacturer: [place]   Lot: [place]: [id number]      Lu Duffel, PharmD, MBA Clinical Acute Care Pharmacist

## 2021-05-03 DIAGNOSIS — M545 Low back pain, unspecified: Secondary | ICD-10-CM | POA: Diagnosis not present

## 2021-05-03 DIAGNOSIS — M6281 Muscle weakness (generalized): Secondary | ICD-10-CM | POA: Diagnosis not present

## 2021-05-03 DIAGNOSIS — M5416 Radiculopathy, lumbar region: Secondary | ICD-10-CM | POA: Diagnosis not present

## 2021-05-08 DIAGNOSIS — M5416 Radiculopathy, lumbar region: Secondary | ICD-10-CM | POA: Diagnosis not present

## 2021-05-08 DIAGNOSIS — M545 Low back pain, unspecified: Secondary | ICD-10-CM | POA: Diagnosis not present

## 2021-05-08 DIAGNOSIS — M6281 Muscle weakness (generalized): Secondary | ICD-10-CM | POA: Diagnosis not present

## 2021-05-10 DIAGNOSIS — M5416 Radiculopathy, lumbar region: Secondary | ICD-10-CM | POA: Diagnosis not present

## 2021-05-10 DIAGNOSIS — M545 Low back pain, unspecified: Secondary | ICD-10-CM | POA: Diagnosis not present

## 2021-05-10 DIAGNOSIS — M6281 Muscle weakness (generalized): Secondary | ICD-10-CM | POA: Diagnosis not present

## 2021-05-15 DIAGNOSIS — M5416 Radiculopathy, lumbar region: Secondary | ICD-10-CM | POA: Diagnosis not present

## 2021-05-15 DIAGNOSIS — M545 Low back pain, unspecified: Secondary | ICD-10-CM | POA: Diagnosis not present

## 2021-05-15 DIAGNOSIS — M6281 Muscle weakness (generalized): Secondary | ICD-10-CM | POA: Diagnosis not present

## 2021-05-17 DIAGNOSIS — M6281 Muscle weakness (generalized): Secondary | ICD-10-CM | POA: Diagnosis not present

## 2021-05-17 DIAGNOSIS — M5416 Radiculopathy, lumbar region: Secondary | ICD-10-CM | POA: Diagnosis not present

## 2021-05-17 DIAGNOSIS — M545 Low back pain, unspecified: Secondary | ICD-10-CM | POA: Diagnosis not present

## 2021-05-22 DIAGNOSIS — M6281 Muscle weakness (generalized): Secondary | ICD-10-CM | POA: Diagnosis not present

## 2021-05-22 DIAGNOSIS — M5416 Radiculopathy, lumbar region: Secondary | ICD-10-CM | POA: Diagnosis not present

## 2021-05-22 DIAGNOSIS — M545 Low back pain, unspecified: Secondary | ICD-10-CM | POA: Diagnosis not present

## 2021-05-29 DIAGNOSIS — H353221 Exudative age-related macular degeneration, left eye, with active choroidal neovascularization: Secondary | ICD-10-CM | POA: Diagnosis not present

## 2021-05-29 DIAGNOSIS — M5416 Radiculopathy, lumbar region: Secondary | ICD-10-CM | POA: Diagnosis not present

## 2021-05-29 DIAGNOSIS — M6281 Muscle weakness (generalized): Secondary | ICD-10-CM | POA: Diagnosis not present

## 2021-05-29 DIAGNOSIS — M545 Low back pain, unspecified: Secondary | ICD-10-CM | POA: Diagnosis not present

## 2021-05-31 DIAGNOSIS — M6281 Muscle weakness (generalized): Secondary | ICD-10-CM | POA: Diagnosis not present

## 2021-05-31 DIAGNOSIS — M545 Low back pain, unspecified: Secondary | ICD-10-CM | POA: Diagnosis not present

## 2021-05-31 DIAGNOSIS — M5416 Radiculopathy, lumbar region: Secondary | ICD-10-CM | POA: Diagnosis not present

## 2021-06-01 ENCOUNTER — Other Ambulatory Visit: Payer: PPO

## 2021-06-01 ENCOUNTER — Other Ambulatory Visit: Payer: Self-pay

## 2021-06-01 ENCOUNTER — Other Ambulatory Visit (INDEPENDENT_AMBULATORY_CARE_PROVIDER_SITE_OTHER): Payer: PPO

## 2021-06-01 DIAGNOSIS — R7989 Other specified abnormal findings of blood chemistry: Secondary | ICD-10-CM

## 2021-06-01 DIAGNOSIS — E210 Primary hyperparathyroidism: Secondary | ICD-10-CM | POA: Diagnosis not present

## 2021-06-01 LAB — HEPATIC FUNCTION PANEL
ALT: 23 U/L (ref 0–35)
AST: 20 U/L (ref 0–37)
Albumin: 4.3 g/dL (ref 3.5–5.2)
Alkaline Phosphatase: 73 U/L (ref 39–117)
Bilirubin, Direct: 0.2 mg/dL (ref 0.0–0.3)
Total Bilirubin: 1.2 mg/dL (ref 0.2–1.2)
Total Protein: 7.5 g/dL (ref 6.0–8.3)

## 2021-06-01 LAB — BASIC METABOLIC PANEL
BUN: 22 mg/dL (ref 6–23)
CO2: 31 mEq/L (ref 19–32)
Calcium: 10.9 mg/dL — ABNORMAL HIGH (ref 8.4–10.5)
Chloride: 103 mEq/L (ref 96–112)
Creatinine, Ser: 0.83 mg/dL (ref 0.40–1.20)
GFR: 67.87 mL/min (ref >60.00)
Glucose, Bld: 103 mg/dL — ABNORMAL HIGH (ref 70–99)
Potassium: 4.8 mEq/L (ref 3.5–5.1)
Sodium: 140 mEq/L (ref 135–145)

## 2021-06-01 LAB — VITAMIN D 25 HYDROXY (VIT D DEFICIENCY, FRACTURES): VITD: 42.98 ng/mL (ref 30.00–100.00)

## 2021-06-02 LAB — PTH, INTACT AND CALCIUM
Calcium: 10.3 mg/dL (ref 8.6–10.4)
PTH: 119 pg/mL — ABNORMAL HIGH (ref 16–77)

## 2021-06-04 ENCOUNTER — Other Ambulatory Visit: Payer: Self-pay | Admitting: Family Medicine

## 2021-06-04 DIAGNOSIS — I10 Essential (primary) hypertension: Secondary | ICD-10-CM

## 2021-06-04 DIAGNOSIS — R739 Hyperglycemia, unspecified: Secondary | ICD-10-CM

## 2021-06-04 DIAGNOSIS — E210 Primary hyperparathyroidism: Secondary | ICD-10-CM

## 2021-06-04 DIAGNOSIS — M109 Gout, unspecified: Secondary | ICD-10-CM

## 2021-06-05 DIAGNOSIS — M5416 Radiculopathy, lumbar region: Secondary | ICD-10-CM | POA: Diagnosis not present

## 2021-06-05 DIAGNOSIS — M545 Low back pain, unspecified: Secondary | ICD-10-CM | POA: Diagnosis not present

## 2021-06-05 DIAGNOSIS — M6281 Muscle weakness (generalized): Secondary | ICD-10-CM | POA: Diagnosis not present

## 2021-06-07 DIAGNOSIS — M6281 Muscle weakness (generalized): Secondary | ICD-10-CM | POA: Diagnosis not present

## 2021-06-07 DIAGNOSIS — M5416 Radiculopathy, lumbar region: Secondary | ICD-10-CM | POA: Diagnosis not present

## 2021-06-07 DIAGNOSIS — M545 Low back pain, unspecified: Secondary | ICD-10-CM | POA: Diagnosis not present

## 2021-06-12 DIAGNOSIS — M5416 Radiculopathy, lumbar region: Secondary | ICD-10-CM | POA: Diagnosis not present

## 2021-06-12 DIAGNOSIS — M6281 Muscle weakness (generalized): Secondary | ICD-10-CM | POA: Diagnosis not present

## 2021-06-12 DIAGNOSIS — M545 Low back pain, unspecified: Secondary | ICD-10-CM | POA: Diagnosis not present

## 2021-06-19 DIAGNOSIS — Z1231 Encounter for screening mammogram for malignant neoplasm of breast: Secondary | ICD-10-CM | POA: Diagnosis not present

## 2021-06-19 LAB — HM MAMMOGRAPHY: HM Mammogram: ABNORMAL — AB (ref 0–4)

## 2021-06-28 ENCOUNTER — Encounter: Payer: Self-pay | Admitting: Family Medicine

## 2021-06-28 DIAGNOSIS — R928 Other abnormal and inconclusive findings on diagnostic imaging of breast: Secondary | ICD-10-CM | POA: Diagnosis not present

## 2021-07-10 ENCOUNTER — Other Ambulatory Visit: Payer: Self-pay | Admitting: Family Medicine

## 2021-07-10 DIAGNOSIS — H353221 Exudative age-related macular degeneration, left eye, with active choroidal neovascularization: Secondary | ICD-10-CM | POA: Diagnosis not present

## 2021-07-24 ENCOUNTER — Other Ambulatory Visit: Payer: Self-pay | Admitting: Family Medicine

## 2021-07-24 IMAGING — DX DG LUMBAR SPINE COMPLETE 4+V
5 series · 5 of 5 positions shown · non-contrast
Comparison: December 29, 2015

CLINICAL DATA: Low back pain

EXAM:
LUMBAR SPINE - COMPLETE 4+ VIEW

[l-spine ap]
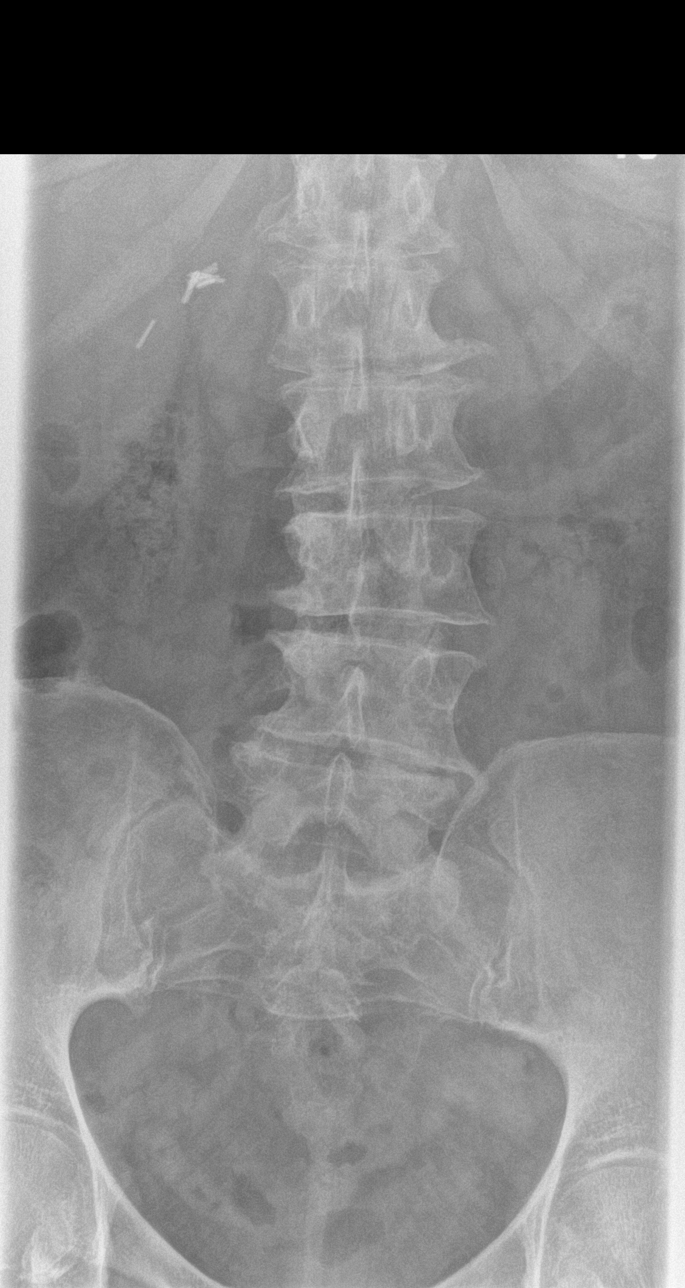

[l-spine obl (1 of 2)]
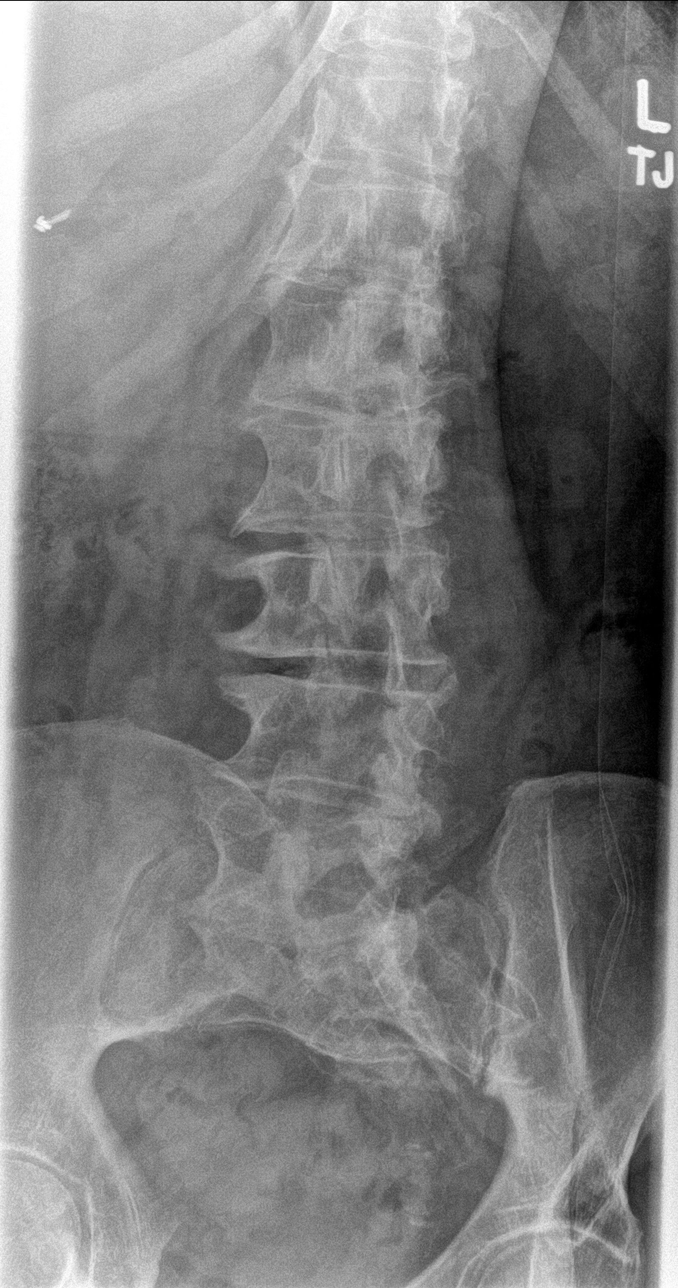

[l-spine obl (2 of 2)]
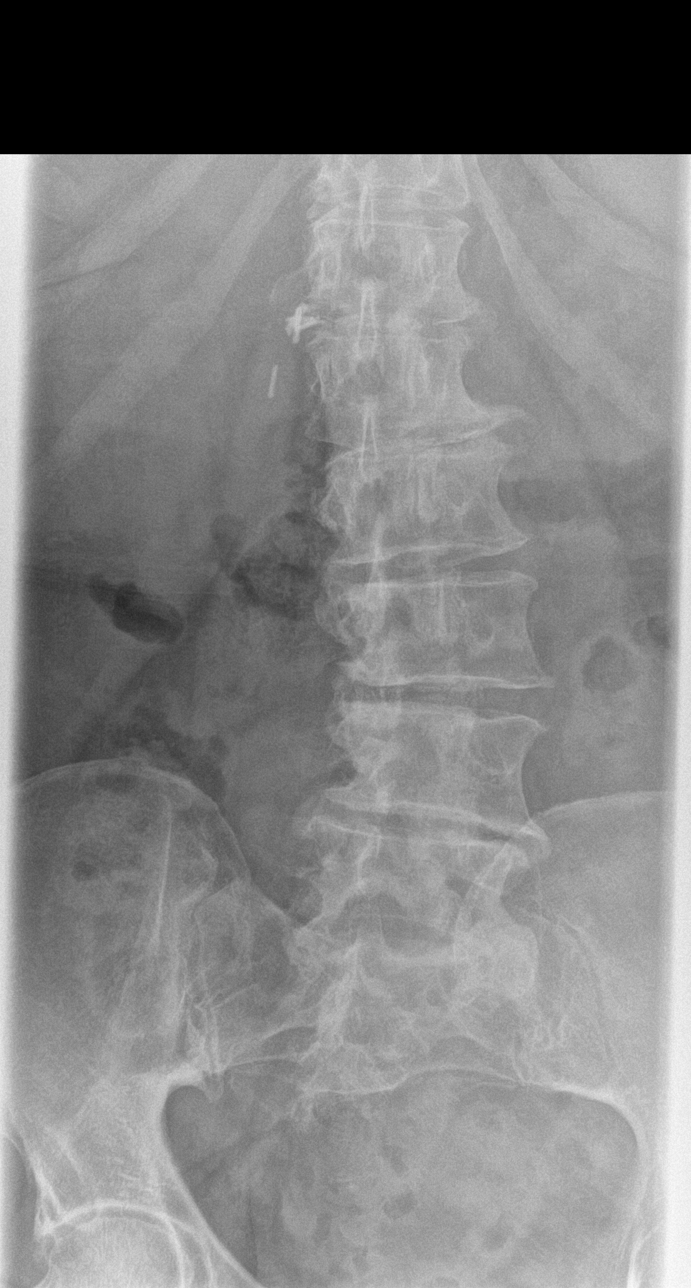

[l-spine lat]
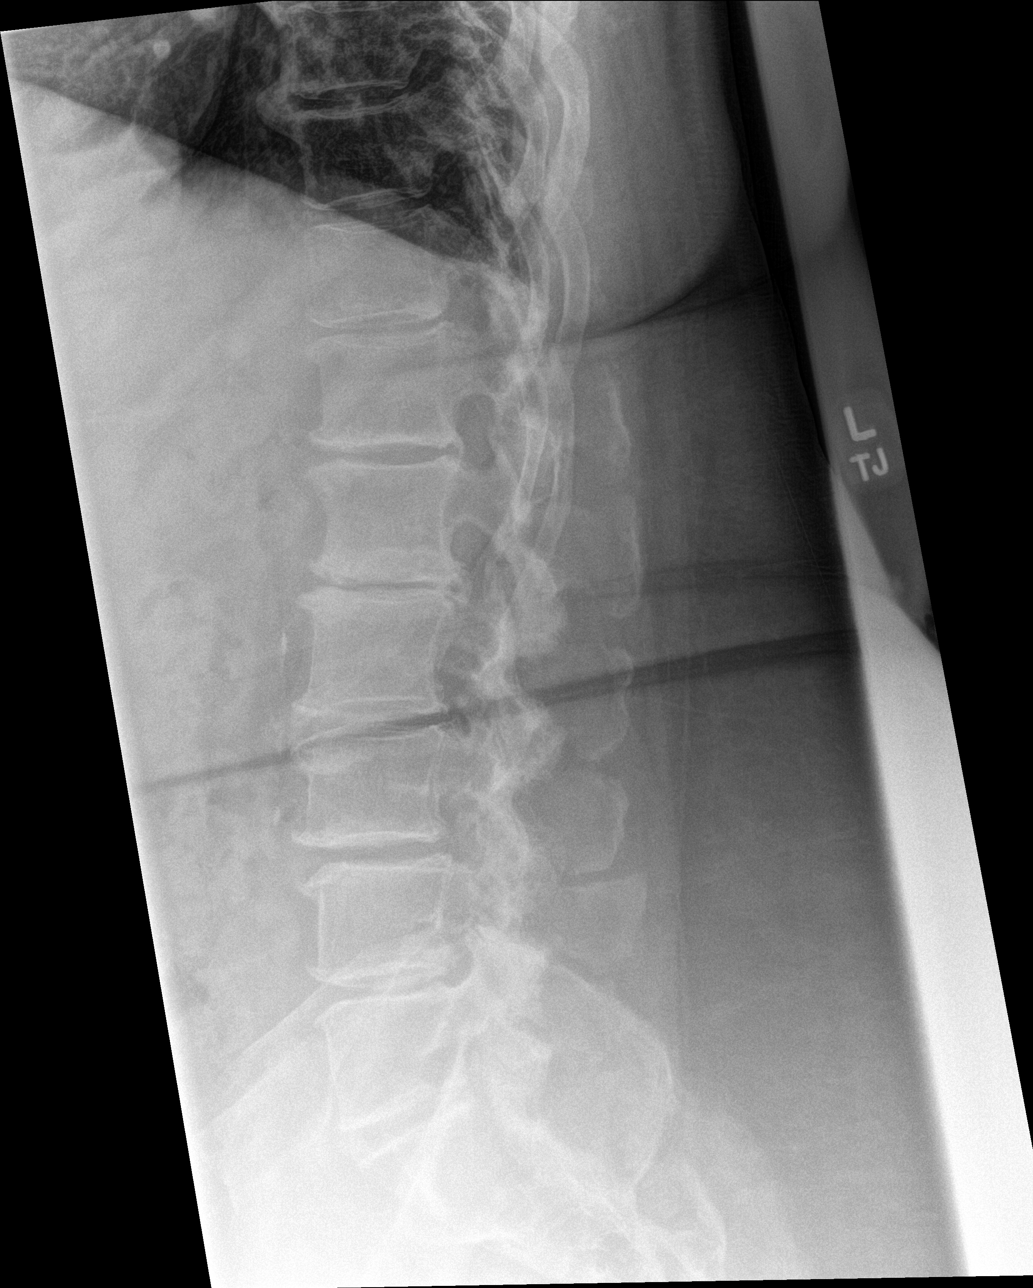

[l-spine l5/s1]
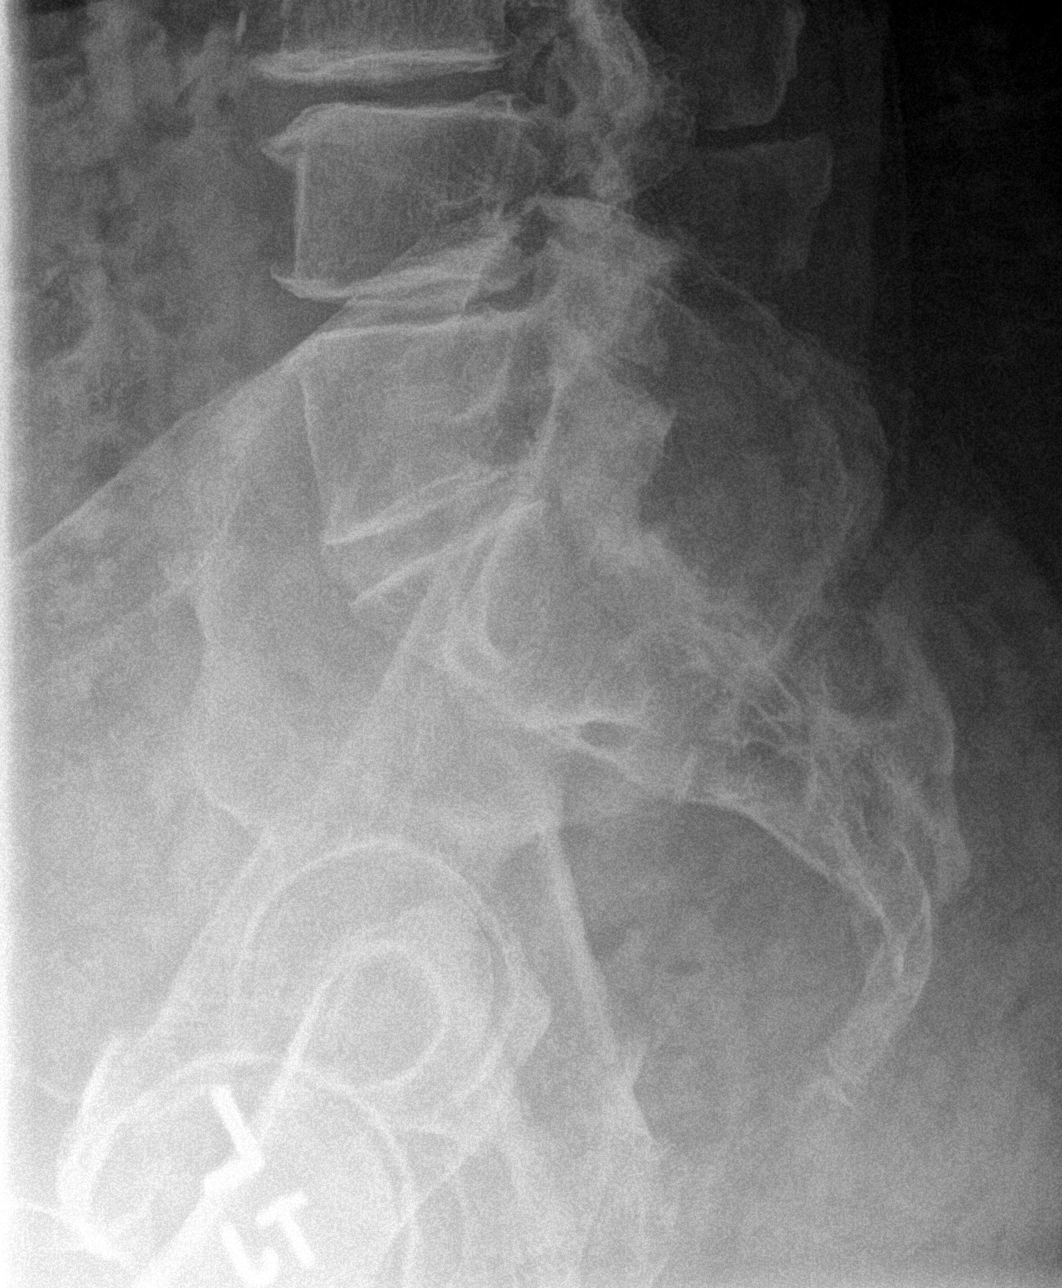

[5 of 5 positions shown; findings below may reference images not displayed]

FINDINGS: Frontal, lateral, spot lumbosacral lateral, and bilateral oblique
views were obtained. There are 6 non-rib-bearing lumbar type
vertebral bodies. There is lumbar levoscoliosis with mild rotatory
component. There is no fracture or spondylolisthesis. There is
moderate disc space narrowing to varying degrees at all levels with
disc space narrowing most notably at L1-2, L2-3, L4-5, and L5-6.
There is facet osteoarthritic change at all levels bilaterally.
IMPRESSION: Multilevel osteoarthritic change, similar to prior study. Scoliosis.
No fracture or spondylolisthesis.

## 2021-08-04 NOTE — Progress Notes (Deleted)
Subjective:   Mercedes [name] is a 76 y.o. female who presents for Medicare Annual (Subsequent) preventive examination.  I connected with Jacquelyne Balint today by telephone and verified that I am speaking with the correct person using two identifiers. Location patient: home Location provider: work Persons participating in the virtual visit: patient, Marine scientist.    I discussed the limitations, risks, security and privacy concerns of performing an evaluation and management service by telephone and the availability of in person appointments. I also discussed with the patient that there may be a patient responsible charge related to this service. The patient expressed understanding and verbally consented to this telephonic visit.    Interactive audio and video telecommunications were attempted between this provider and patient, however failed, due to patient having technical difficulties OR patient did not have access to video capability.  We continued and completed visit with audio only.  Some vital signs may be absent or patient reported.   Time Spent with patient on telephone encounter: *** minutes  Review of Systems           Objective:    There were no vitals filed for this visit. There is no height or weight on file to calculate BMI.  Advanced Directives 08/05/2020 01/14/2018 08/13/2017 08/06/2017 08/02/2017 07/30/2017 03/13/2017  Does Patient Have a Medical Advance Directive? Yes Yes No - No No Yes  Type of Paramedic of [place];Living will [place];Living will - - - - Press photographer;Living will  Does patient want to make changes to medical advance directive? - - - - - - -  Copy of [place] in Chart? No - copy requested No - copy requested - - - - No - copy requested  Would patient like information on creating a medical advance directive? - - No - Patient declined No - Patient declined No - Patient declined No -  Patient declined -    Current Medications (verified) Outpatient Encounter Medications as of 08/07/2021  Medication Sig   Aflibercept (EYLEA) 2 MG/0.05ML SOLN Place 2 mg into the left eye every 30 (thirty) days.   cephALEXin (KEFLEX) 500 MG capsule Take 1 capsule (500 mg total) by mouth 3 (three) times daily.   cholecalciferol (VITAMIN D) 1000 units tablet Take 1,000 Units by mouth 2 (two) times a day.    colchicine 0.6 MG tablet Take 1 tablet by mouth twice daily as needed   COVID-19 mRNA bivalent vaccine, Pfizer, (PFIZER COVID-19 VAC BIVALENT) injection Inject into the muscle.   COVID-19 mRNA Vac-TriS, Pfizer, SUSP injection Inject into the muscle.   levothyroxine (SYNTHROID) 200 MCG tablet TAKE 1 TABLET BY MOUTH ONCE DAILY BEFORE BREAKFAST   levothyroxine (SYNTHROID) 25 MCG tablet TAKE 1 TABLET BY MOUTH ONCE DAILY BEFORE  BREAKFAST  ON  MONDAY  THROUGH  FRIDAYS  SKIP  ON  SATURADAY  AND  SUNDAY   metoprolol (TOPROL-XL) 200 MG 24 hr tablet TAKE 1 TABLET BY MOUTH IN THE MORNING   Multiple Vitamin (MULTIVITAMIN WITH MINERALS) TABS tablet Take 1 tablet by mouth daily.   Multiple Vitamins-Minerals (PRESERVISION AREDS 2) CAPS Take 1 capsule by mouth 2 (two) times daily.   naproxen sodium (ALEVE) 220 MG tablet Take 220 mg by mouth in the morning and at bedtime.   Omega 3 1000 MG CAPS Take 2,000 mg by mouth daily.   Potassium 99 MG TABS Take 99 mg by mouth every morning.   pravastatin (PRAVACHOL) 40 MG  tablet Take 1 tablet by mouth once daily   psyllium (REGULOID) 0.52 G capsule Take 0.52 g by mouth daily. Two tablets per day   No facility-administered encounter medications on file as of 08/07/2021.    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   HLD (hyperlipidemia)    under control   HTN (hypertension)    Hypothyroidism    Macular degeneration    Osteoarthritis    Rapid heartbeat    occasional, does not last long   Tobacco abuse    Past Surgical History:  Procedure Laterality Date   CATARACT EXTRACTION     CHOLECYSTECTOMY N/A 03/17/2014   Procedure: LAPAROSCOPIC CHOLECYSTECTOMY WITH INTRAOPERATIVE CHOLANGIOGRAM ;  Surgeon: Fanny Skates, MD;  Location: [facility];  Service: General;  Laterality: N/A;   COLONOSCOPY     removed polyps   SIGMOIDOSCOPY     Family History  Problem Relation Age of Onset   Ulcers Father    Angina Mother    Hypertension Brother    Prostate cancer Brother    Colon cancer Neg Hx    Breast cancer Neg Hx    Esophageal cancer Neg Hx    Stomach cancer Neg Hx    Rectal cancer Neg Hx    Social History   Socioeconomic History   Marital status: Married    Spouse name: Not on file   Number of children: 2   Years of education: Not on file   Highest education level: Not on file  Occupational History   Occupation: Teacher-retired  Tobacco Use   Smoking status: Former    Packs/day: 0.25    Years: 15.00    Pack years: 3.75    Types: Cigarettes    Quit date: 07/04/2010    Years since quitting: 11.0   Smokeless tobacco: Never  Vaping Use   Vaping Use: Never used  Substance and Sexual Activity   Alcohol use: Yes    Comment: Wine, 1 per day   Drug use: No   Sexual activity: Never  Other Topics Concern   Not on file  Social History Narrative   Married 1964   2 kids, in [place].  2 grandkids   Volunteered prev with [place]   Enjoyed golf but gave it up after her husband had a stroke   Social Determinants of Radio broadcast assistant Strain: Low Risk    Difficulty of Paying Living Expenses: Not hard at all  Food Insecurity: No Food Insecurity   Worried About Charity fundraiser in the  Last Year: Never true   Arboriculturist in the Last Year: Never true  Transportation Needs: No Transportation Needs   Lack of Transportation (Medical): No   Lack of Transportation (Non-Medical): No  Physical Activity: Inactive   Days of Exercise per Week: 0 days   Minutes of Exercise per Session: 0 min  Stress: No Stress Concern Present   Feeling of Stress : Not at all  Social Connections: Not on file    Tobacco Counseling Counseling given: Not Answered   Clinical Intake:  Diabetic? No         Activities of Daily Living In your present state of health, do you have any difficulty performing the following activities: 08/05/2020  Hearing? N  Vision? N  Difficulty concentrating or making decisions? N  Walking or climbing stairs? N  Dressing or bathing? N  Doing errands, shopping? N  Preparing Food and eating ? N  Using the Toilet? N  In the past six months, have you accidently leaked urine? N  Do you have problems with loss of bowel control? N  Managing your Medications? N  Managing your Finances? N  Housekeeping or managing your Housekeeping? N  Some recent data might be hidden    Patient Care Team: Tonia Ghent, MD as PCP - General (Family Medicine) Leandrew Koyanagi, MD as Referring Physician (Ophthalmology) Ralene Bathe, MD as Referring Physician (Dermatology)  Indicate any recent Medical Services you may have received from other than [facility] providers in the past year (date may be approximate).     Assessment:   This is a routine wellness examination for Aarilyn.  Hearing/Vision screen No results found.  Dietary issues and exercise activities discussed:     Goals Addressed   None    Depression Screen PHQ 2/9 Scores 08/05/2020 06/17/2020 01/22/2019 01/14/2018 12/27/2016 12/29/2015 12/23/2015  PHQ - 2 Score 0 0 0 0 0 0 0  PHQ- 9 Score 0 - - 0 - - -    Fall Risk Fall Risk  08/05/2020 06/17/2020 01/22/2019 01/14/2018 12/27/2016   Falls in the past year? 0 0 0 No No  Number falls in past yr: 0 0 - - -  Injury with Fall? 0 0 - - -  Risk for fall due to : No Fall Risks - - - -  Follow up Falls evaluation completed;Falls prevention discussed Falls evaluation completed - - -    FALL RISK PREVENTION PERTAINING TO THE HOME:  Any stairs in or around the home? {YES/NO:21197} If so, are there any without handrails? {YES/NO:21197} Home free of loose throw rugs in walkways, pet beds, electrical cords, etc? {YES/NO:21197} Adequate lighting in your home to reduce risk of falls? {YES/NO:21197}  ASSISTIVE DEVICES UTILIZED TO PREVENT FALLS:  Life alert? {YES/NO:21197} Use of a cane, walker or w/c? {YES/NO:21197} Grab bars in the bathroom? {YES/NO:21197} Shower chair or bench in shower? {YES/NO:21197} Elevated toilet seat or a handicapped toilet? {YES/NO:21197}  TIMED UP AND GO:  Was the test performed? No .    Cognitive Function: MMSE - Mini Mental State Exam 08/05/2020 01/14/2018 12/23/2015  Orientation to time 5 5 5   Orientation to Place 5 5 5   Registration 3 3 3   Attention/ Calculation 5 0 0  Recall 3 3 3   Language- name 2 objects - 0 0  Language- repeat 1 1 1   Language- follow 3 step command - 3 3  Language- read & follow direction - 0 0  Write a sentence - 0 0  Copy design - 0 0  Total score - 20 20        Immunizations Immunization History  Administered Date(s) Administered   Fluad Quad(high Dose 65+) 03/12/2019, 03/26/2020, 03/17/2021   H1N1 07/08/2008   Influenza Whole 05/23/2004   Influenza,inj,Quad PF,6+ Mos 04/07/2014, 03/31/2015, 04/18/2016, 04/04/2017, 04/17/2018   PFIZER Comirnaty(Gray Top)Covid-19 Tri-Sucrose Vaccine 01/17/2021   PFIZER(Purple Top)SARS-COV-2 Vaccination 07/09/2019, 07/30/2019, 04/25/2020   Pfizer Covid-19 Vaccine Bivalent Booster 53yrs & up 05/02/2021   Pneumococcal Conjugate-13 12/13/2014   Pneumococcal Polysaccharide-23 08/09/2010  Rabies, IM 07/30/2017, 08/02/2017,  08/06/2017, 08/13/2017   Td 07/02/1998, 07/08/2008   Tdap 07/30/2017   Zoster Recombinat (Shingrix) 09/21/2020, 11/22/2020   Zoster, Live 10/30/2010    TDAP status: Due, Education has been provided regarding the importance of this vaccine. Advised may receive this vaccine at local pharmacy or Health Dept. Aware to provide a copy of the vaccination record if obtained from local pharmacy or Health Dept. Verbalized acceptance and understanding.  Flu Vaccine status: Up to date  Pneumococcal vaccine status: Up to date  Covid-19 vaccine status: Completed vaccines  Qualifies for Shingles Vaccine? Yes   Zostavax completed Yes   Shingrix Completed?: Yes  Screening Tests Health Maintenance  Topic Date Due   MAMMOGRAM  06/19/2022   COLONOSCOPY (Pts 45-18yrs Insurance coverage will need to be confirmed)  11/05/2023   TETANUS/TDAP  07/31/2027   Pneumonia Vaccine 70+ Years old  Completed   INFLUENZA VACCINE  Completed   DEXA SCAN  Completed   COVID-19 Vaccine  Completed   Hepatitis C Screening  Completed   Zoster Vaccines- Shingrix  Completed   HPV VACCINES  Aged Out    Health Maintenance  There are no preventive care reminders to display for this patient.  Colorectal cancer screening: No longer required.   Mammogram status: Completed 06/19/21. Repeat every year  Bone Density status: Completed 04/19/20. Results reflect: Bone density results: OSTEOPENIA. Repeat every 2 years.  Lung Cancer Screening: (Low Dose CT Chest recommended if Age 61-80 years, 30 pack-year currently smoking OR have quit w/in 15years.) does not qualify.     Additional Screening:  Hepatitis C Screening: does qualify; Completed 12/23/15  Vision Screening: Recommended annual ophthalmology exams for early detection of glaucoma and other disorders of the eye. Is the patient up to date with their annual eye exam?  {YES/NO:21197} Who is the provider or what is the name of the office in which the patient attends  annual eye exams? *** If pt is not established with a provider, would they like to be referred to a provider to establish care? {YES/NO:21197}.   Dental Screening: Recommended annual dental exams for proper oral hygiene  Community Resource Referral / Chronic Care Management: CRR required this visit?  {YES/NO:21197}  CCM required this visit?  {YES/NO:21197}     Plan:     I have personally reviewed and noted the following in the patients chart:   Medical and social history Use of alcohol, tobacco or illicit drugs  Current medications and supplements including opioid prescriptions.  Functional ability and status Nutritional status Physical activity Advanced directives List of other physicians Hospitalizations, surgeries, and ER visits in previous 12 months Vitals Screenings to include cognitive, depression, and falls Referrals and appointments  In addition, I have reviewed and discussed with patient certain preventive protocols, quality metrics, and best practice recommendations. A written personalized care plan for preventive services as well as general preventive health recommendations were provided to patient.   Due to this being a telephonic visit, the after visit summary with patients personalized plan was offered to patient via mail or my-chart. ***Patient declined at this time./ Patient would like to access on my-chart/ per request, patient was mailed a copy of AVS./ Patient preferred to pick up at office at next visit.   Loma Messing, LPN   12/02/9526   Nurse Health Advisor  Nurse Notes: none

## 2021-08-07 ENCOUNTER — Ambulatory Visit: Payer: PPO

## 2021-08-08 DIAGNOSIS — H353221 Exudative age-related macular degeneration, left eye, with active choroidal neovascularization: Secondary | ICD-10-CM | POA: Diagnosis not present

## 2021-08-09 ENCOUNTER — Other Ambulatory Visit: Payer: Self-pay

## 2021-08-09 ENCOUNTER — Ambulatory Visit
Admission: EM | Admit: 2021-08-09 | Discharge: 2021-08-09 | Disposition: A | Discharge status: Home / Self Care | Payer: PPO | Attending: Emergency Medicine | Admitting: Emergency Medicine

## 2021-08-09 ENCOUNTER — Encounter: Payer: Self-pay | Admitting: Emergency Medicine

## 2021-08-09 DIAGNOSIS — I10 Essential (primary) hypertension: Secondary | ICD-10-CM | POA: Diagnosis not present

## 2021-08-09 DIAGNOSIS — N390 Urinary tract infection, site not specified: Secondary | ICD-10-CM | POA: Diagnosis not present

## 2021-08-09 DIAGNOSIS — R319 Hematuria, unspecified: Secondary | ICD-10-CM | POA: Diagnosis not present

## 2021-08-09 LAB — URINALYSIS, ROUTINE W REFLEX MICROSCOPIC
Bilirubin Urine: NEGATIVE
Glucose, UA: NEGATIVE mg/dL
Ketones, ur: NEGATIVE mg/dL
Nitrite: NEGATIVE
Protein, ur: 100 mg/dL — AB
Specific Gravity, Urine: 1.01 (ref 1.005–1.030)
pH: 6.5 (ref 5.0–8.0)

## 2021-08-09 LAB — URINALYSIS, MICROSCOPIC (REFLEX): WBC, UA: >50 WBC/hpf (ref 0–5)

## 2021-08-09 MED ORDER — PHENAZOPYRIDINE HCL 200 MG PO TABS: 200.0000 mg | ORAL_TABLET | Freq: Three times a day (TID) | ORAL | 0 refills | Status: DC | PRN

## 2021-08-09 MED ORDER — CEFDINIR 300 MG PO CAPS: 300.0000 mg | ORAL_CAPSULE | Freq: Two times a day (BID) | ORAL | 0 refills | Status: DC

## 2021-08-09 NOTE — Discharge Instructions (Addendum)
Make sure you continue drinking plenty of extra fluids.  Finish the antibiotics, even if you feel better.  Pyridium will turn your urine orange, but will help with your symptoms.  Go to the ER for the signs and symptoms we discussed.  We will contact you if we need to change your antibiotics.  Please follow-up with urology and urogynecology ASAP.  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

## 2021-08-09 NOTE — ED Triage Notes (Signed)
Pt c/o dysuria, urinary frequency. Started about 3 days ago.

## 2021-08-09 NOTE — ED Provider Notes (Signed)
HPI  SUBJECTIVE:  Mercedes [name] is a 76 y.o. female who presents with 3 days of dysuria, urgency, frequency, cloudy, odorous urine, lower abdominal pressure when she urinates.  She also has a 51-month history of dark, foul-smelling vaginal discharge that looks like feces she when she stools.  She does not have any vaginal discharge at any other time.  No nausea, vomiting, fevers, abdominal, back, pelvic pain.  No vaginal odor, bleeding, discharge in the past 3 days.  No antibiotics in the past month.  No antipyretic in the past 6 hours.  She states that she has not been sexually active in 10 years.  She denies rectal or vaginal trauma.  She has tried increasing fluids, sits baths and showers with temporary improvement in her symptoms.  Symptoms are worse with urinating.  She has a past medical history of frequent UTIs that started recently, hypertension.  No history of pyelonephritis, nephrolithiasis, chronic kidney disease, diabetes.  She has not not yet seen urology, and would like her PCP refer her to urology.  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   HLD (hyperlipidemia)    under control   HTN (hypertension)    Hypothyroidism    Macular degeneration    Osteoarthritis    Rapid heartbeat    occasional, does not last long   Tobacco abuse     Past Surgical History:  Procedure Laterality Date   CATARACT EXTRACTION     CHOLECYSTECTOMY N/A 03/17/2014   Procedure: LAPAROSCOPIC CHOLECYSTECTOMY WITH INTRAOPERATIVE CHOLANGIOGRAM ;  Surgeon: Fanny Skates, MD;  Location: [facility];  Service: General;  Laterality: N/A;   COLONOSCOPY     removed polyps   SIGMOIDOSCOPY      Family History  Problem Relation Age of Onset   Ulcers Father    Angina Mother    Hypertension Brother    Prostate cancer Brother    Colon  cancer Neg Hx    Breast cancer Neg Hx    Esophageal cancer Neg Hx    Stomach cancer Neg Hx    Rectal cancer Neg Hx     Social History   Tobacco Use   Smoking status: Former    Packs/day: 0.25    Years: 15.00    Pack years: 3.75    Types: Cigarettes    Quit date: 07/04/2010    Years since quitting: 11.1   Smokeless tobacco: Never  Vaping Use   Vaping Use: Never used  Substance Use Topics   Alcohol use: Yes    Comment: Wine, 1 per day   Drug use: No    No current facility-administered medications for this encounter.  Current Outpatient Medications:    cefdinir (OMNICEF) 300 MG capsule, Take 1 capsule (300 mg total) by mouth 2 (two) times daily for 7 days., Disp: 14 capsule, Rfl: 0   cholecalciferol (VITAMIN D) 1000 units tablet, Take 1,000 Units by mouth 2 (two) times a day. , Disp: , Rfl:    colchicine 0.6 MG tablet, Take 1 tablet by mouth twice daily as needed, Disp: 60 tablet, Rfl: 0   levothyroxine (SYNTHROID) 200 MCG tablet, TAKE 1 TABLET BY MOUTH ONCE DAILY BEFORE BREAKFAST, Disp: 90 tablet, Rfl: 0   levothyroxine (SYNTHROID) 25 MCG tablet, TAKE  1 TABLET BY MOUTH ONCE DAILY BEFORE  BREAKFAST  ON  MONDAY  THROUGH  FRIDAYS  SKIP  ON  SATURADAY  AND  SUNDAY, Disp: 90 tablet, Rfl: 0   metoprolol (TOPROL-XL) 200 MG 24 hr tablet, TAKE 1 TABLET BY MOUTH IN THE MORNING, Disp: 90 tablet, Rfl: 1   Multiple Vitamin (MULTIVITAMIN WITH MINERALS) TABS tablet, Take 1 tablet by mouth daily., Disp: , Rfl:    Multiple Vitamins-Minerals (PRESERVISION AREDS 2) CAPS, Take 1 capsule by mouth 2 (two) times daily., Disp: , Rfl:    naproxen sodium (ALEVE) 220 MG tablet, Take 220 mg by mouth in the morning and at bedtime., Disp: , Rfl:    Omega 3 1000 MG CAPS, Take 2,000 mg by mouth daily., Disp: , Rfl:    phenazopyridine (PYRIDIUM) 200 MG tablet, Take 1 tablet (200 mg total) by mouth 3 (three) times daily as needed for pain., Disp: 6 tablet, Rfl: 0   Potassium 99 MG TABS, Take 99 mg by mouth every  morning., Disp: , Rfl:    pravastatin (PRAVACHOL) 40 MG tablet, Take 1 tablet by mouth once daily, Disp: 90 tablet, Rfl: 1   Aflibercept (EYLEA) 2 MG/0.05ML SOLN, Place 2 mg into the left eye every 30 (thirty) days., Disp: , Rfl:    COVID-19 mRNA bivalent vaccine, Pfizer, (PFIZER COVID-19 VAC BIVALENT) injection, Inject into the muscle., Disp: 0.3 mL, Rfl: 0   COVID-19 mRNA Vac-TriS, Pfizer, SUSP injection, Inject into the muscle., Disp: 0.3 mL, Rfl: 0   psyllium (REGULOID) 0.52 G capsule, Take 0.52 g by mouth daily. Two tablets per day, Disp: , Rfl:    zinc gluconate 50 MG tablet, Take by mouth., Disp: , Rfl:   Allergies  Allergen Reactions   Ace Inhibitors     REACTION: cough   Clarithromycin     REACTION: GI upset   Fenofibrate     REACTION: itching- pt unsure   Lipitor [Atorvastatin]     myalgias   Niacin     "burning", made pt pass out   Penicillins     REACTION: Purpura   Sulfasalazine     REACTION: Purpura   Tetracycline Hcl     REACTION: GI upset     ROS  As noted in HPI.   Physical Exam  BP (!) 172/84 (BP Location: Left Arm)    Pulse (!) 108    Temp 98.4 F (36.9 C) (Oral)    Resp 18    Ht 5\' 3"  (1.6 m)    Wt 101.6 kg    SpO2 99%    BMI 39.68 kg/m   Constitutional: Well developed, well nourished, no acute distress Eyes:  EOMI, conjunctiva normal bilaterally HENT: Normocephalic, atraumatic,mucus membranes moist Respiratory: Normal inspiratory effort Cardiovascular: Normal rate GI: nondistended soft, nontender, no suprapubic, flank tenderness Back: No CVAT skin: No rash, skin intact Musculoskeletal: no deformities Neurologic: Alert & oriented x 3, no focal neuro deficits Psychiatric: Speech and behavior appropriate   ED Course   Medications - No data to display  Orders Placed This Encounter  Procedures   Urine Culture    Standing Status:   Standing    Number of Occurrences:   1    Order Specific Question:   Indication    Answer:   Dysuria    Urinalysis, Routine w reflex microscopic    Standing Status:   Standing    Number of Occurrences:   1   Urinalysis, Microscopic (reflex)    Standing Status:  Standing    Number of Occurrences:   1    Results for orders placed or performed during the hospital encounter of 08/09/21 (from the past 24 hour(s))  Urinalysis, Routine w reflex microscopic Urine, Clean Catch     Status: Abnormal   Collection Time: 08/09/21  1:12 PM  Result Value Ref Range   Color, Urine YELLOW YELLOW   APPearance HAZY (A) CLEAR   Specific Gravity, Urine 1.010 1.005 - 1.030   pH 6.5 5.0 - 8.0   Glucose, UA NEGATIVE NEGATIVE mg/dL   Hgb urine dipstick MODERATE (A) NEGATIVE   Bilirubin Urine NEGATIVE NEGATIVE   Ketones, ur NEGATIVE NEGATIVE mg/dL   Protein, ur 100 (A) NEGATIVE mg/dL   Nitrite NEGATIVE NEGATIVE   Leukocytes,Ua MODERATE (A) NEGATIVE  Urinalysis, Microscopic (reflex)     Status: Abnormal   Collection Time: 08/09/21  1:12 PM  Result Value Ref Range   RBC / HPF 0-5 0 - 5 RBC/hpf   WBC, UA >50 0 - 5 WBC/hpf   Bacteria, UA MANY (A) NONE SEEN   Squamous Epithelial / LPF 0-5 0 - 5   WBC Clumps PRESENT    Mucus PRESENT    No results found.  ED Clinical Impression  1. Urinary tract infection with hematuria, site unspecified   2. Elevated blood pressure reading in office with diagnosis of hypertension      ED Assessment/Plan  Previous labs reviewed.  She recently had an E. coli UTI that was sensitive to Augmentin, second and third generation cephalosporins, Macrobid and Bactrim.  UA consistent with a UTI.  Sending urine off for culture to confirm antibiotic choice.  Heart rate noted.  Unsure if it is because she is anxious or if she is starting to get systemic symptoms.  Will send home with Omnicef, Pyridium.  Continue pushing fluids.  She states that she has tolerated cephalosporins without any problem before.  Advised her to follow-up with her PMD ASAP for referral to urology and  urogynecology.  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  ER return precautions given.  Blood pressure also noted.  Again, suspect anxiety.  Patient to keep an eye on this.  Discussed labs, , MDM, treatment plan, and plan for follow-up with patient. Discussed sn/sx that should prompt return to the ED. patient agrees with plan.   Meds ordered this encounter  Medications   cefdinir (OMNICEF) 300 MG capsule    Sig: Take 1 capsule (300 mg total) by mouth 2 (two) times daily for 7 days.    Dispense:  14 capsule    Refill:  0   phenazopyridine (PYRIDIUM) 200 MG tablet    Sig: Take 1 tablet (200 mg total) by mouth 3 (three) times daily as needed for pain.    Dispense:  6 tablet    Refill:  0      *This clinic note was created using [employer]. Therefore, there may be occasional mistakes despite careful proofreading.  ?    Melynda Ripple, MD 08/09/21 1349

## 2021-08-10 ENCOUNTER — Telehealth: Payer: Self-pay

## 2021-08-10 DIAGNOSIS — N390 Urinary tract infection, site not specified: Secondary | ICD-10-CM

## 2021-08-10 DIAGNOSIS — N898 Other specified noninflammatory disorders of vagina: Secondary | ICD-10-CM

## 2021-08-10 NOTE — Telephone Encounter (Signed)
Per chart review tab pt was seen on 08/09/21 at [facility]. Sending note to Dr Damita Dunnings and Janett Billow CMA.

## 2021-08-10 NOTE — Telephone Encounter (Signed)
[place] Day - Client TELEPHONE ADVICE RECORD [facility] Patient Name: Mercedes [name] Gender: Female DOB: April 08, 1946 Age: 76 Y 10 M 17 D Return Phone Number: [phone] (Primary), [phone] (Secondary) Address: City/ State/ Zip: [place]  [address] Client [place] [facility] - Client Client Site [place] - Day Provider Renford Dills - MD Contact Type Call Who Is Calling Patient / Member / Family / Caregiver Call Type Triage / Clinical Relationship To Patient Self Return Phone Number [phone] (Primary) Chief Complaint Urination Pain Reason for Call Symptomatic / Request for [name] Translation No Nurse Assessment Nurse: Breeding, RN, [place] Date/Time (Eastern Time): 08/09/2021 12:02:17 PM Confirm and document reason for call. If symptomatic, describe symptoms. ---Caller states she has having urination urgency, increased frequency (almost constant) and pain. Caller noted symptoms started about 3 days ago. Pain rated at 5 on a 0/10 scale. Caller denies any other s/s at this time. Does the patient have any new or worsening symptoms? ---Yes Will a triage be completed? ---Yes Related visit to physician within the last 2 weeks? ---No Does the PT have any chronic conditions? (i.e. diabetes, asthma, this includes High risk factors for pregnancy, etc.) ---No Is this a behavioral health or substance abuse call? ---No Guidelines Guideline Title Affirmed Question Affirmed Notes Nurse Date/Time Eilene Ghazi Time) Urination Pain - Female Age > 29 years [place], [place], [place] 08/09/2021 12:04:15 PM Disp. Time Eilene Ghazi Time) Disposition Final User 08/09/2021 12:08:02 PM See PCP within 24 Hours Yes Breeding, RN, Theme park manager Disagree/Comply Comply PLEASE NOTE: All timestamps contained within this report are  represented as [place] Standard Time. CONFIDENTIALTY NOTICE: This fax transmission is intended only for the addressee. It contains information that is legally privileged, confidential or otherwise protected from use or disclosure. If you are not the intended recipient, you are strictly prohibited from reviewing, disclosing, copying using or disseminating any of this information or taking any action in reliance on or regarding this information. If you have received this fax in error, please notify us immediately by telephone so that we can arrange for its return to [place]. Phone: [phone], Toll-Free: [phone], Fax: [phone] Page: 2 of 2 Call Id: [id number] [place] Understands Yes PreDisposition Call Doctor Care Advice Given Per Guideline SEE PCP WITHIN 24 HOURS: * IF OFFICE WILL BE OPEN: You need to be examined within the next 24 hours. Call your doctor (or NP/PA) when the office opens and make an appointment. REASSURANCE AND EDUCATION - POSSIBLE URINE INFECTION: * This could be an urinary tract infection. * You should see your doctor (or NP/PA) to be examined and tested. DRINK EXTRA FLUIDS: * Drink extra fluids. * Reason: This will water-down your urine and make it less painful to pass. If there is an infection, this will help wash out the germs from your bladder. CRANBERRY JUICE: * Some people think that drinking cranberry juice helps fight off urinary tract infections. WARM SALINE SITZ BATHS - TWICE DAILY FOR URINATION PAIN: CALL BACK IF: * Fever or back pain occurs * You become worse CARE ADVICE given per Urination Pain - Female (Adult) guideline. Comments User: Jenny Reichmann, RN Date/Time Eilene Ghazi Time): 08/09/2021 12:10:22 PM Office is closed today. Advised caller to call back to the office to see if she can be seen tomorrow morning within the 24 hour time frame. Caller verbalized understanding. Referrals REFERRED TO PCP OFFIC

## 2021-08-11 ENCOUNTER — Encounter: Payer: Self-pay | Admitting: Family

## 2021-08-11 ENCOUNTER — Ambulatory Visit (INDEPENDENT_AMBULATORY_CARE_PROVIDER_SITE_OTHER): Payer: PPO | Admitting: Family

## 2021-08-11 ENCOUNTER — Other Ambulatory Visit: Payer: Self-pay

## 2021-08-11 VITALS — BP 130/80 | HR 81 | Temp 96.6°F | Ht 64.0 in | Wt 213.0 lb

## 2021-08-11 DIAGNOSIS — N898 Other specified noninflammatory disorders of vagina: Secondary | ICD-10-CM | POA: Diagnosis not present

## 2021-08-11 DIAGNOSIS — R300 Dysuria: Secondary | ICD-10-CM | POA: Diagnosis not present

## 2021-08-11 DIAGNOSIS — N3001 Acute cystitis with hematuria: Secondary | ICD-10-CM | POA: Insufficient documentation

## 2021-08-11 LAB — URINALYSIS, ROUTINE W REFLEX MICROSCOPIC
Bilirubin Urine: NEGATIVE
Ketones, ur: NEGATIVE
Nitrite: POSITIVE — AB
Specific Gravity, Urine: 1.015 (ref 1.000–1.030)
Urine Glucose: NEGATIVE
Urobilinogen, UA: 1 (ref 0.0–1.0)
pH: 6 (ref 5.0–8.0)

## 2021-08-11 MED ORDER — CIPROFLOXACIN HCL 500 MG PO TABS: 500.0000 mg | ORAL_TABLET | Freq: Two times a day (BID) | ORAL | 0 refills | Status: AC

## 2021-08-11 NOTE — Assessment & Plan Note (Signed)
Stop cefdinir, change to ciprofloxacin. Pt to take as directed. pending urine again, pending culture.

## 2021-08-11 NOTE — Telephone Encounter (Signed)
Please get update on patient.  Verify that she wants to see urology and I'll sign the referral.  Thanks.

## 2021-08-11 NOTE — Assessment & Plan Note (Signed)
Wet prep today in office as well as referral to urogyn for further eval/treat. Pt with ongoing vaginal discharge x six months.

## 2021-08-11 NOTE — Telephone Encounter (Signed)
Per appt notes pt has appt with T Dugal FNP 08/11/21 at 11;20. Sending note to [facility] FNP and Dr Damita Dunnings as PCP.

## 2021-08-11 NOTE — Telephone Encounter (Signed)
[place] Night - Client TELEPHONE ADVICE RECORD [facility] Patient Name: Mercedes [name] Gender: Female DOB: 1945/08/07 Age: 76 Y 10 M 19 D Return Phone Number: [phone] (Primary), [phone] (Secondary) Address: City/ State/ Zip: [place]  [address] Client [place] Night - Client Client Site [place] - Night Provider Mercedes [name] - MD Contact Type Call Who Is Calling Patient / Member / Family / Caregiver Call Type Triage / Clinical Relationship To Patient Self Return Phone Number [phone] (Primary) Chief Complaint Urination Pain Reason for Call Symptomatic / Request for [name] states she has went to a Urgent Care for a UTI and she was given medication. Caller states she was advised to schedule an appointment and has scheduled the appointment. Caller states this morning she is still experiencing pressure, and pain. Caller states she is running out of the medication. Caller states she needs a refill. Translation No Nurse Assessment Nurse: Altamease Oiler, RN, [place] Date/Time (Eastern Time): 08/11/2021 8:38:36 AM Confirm and document reason for call. If symptomatic, describe symptoms. ---pt states she was seen at [facility] and dx with UTI. 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 placed on antibiotics. scheduled for appt on 2/17. is still experiencing UTI sx: pressure, urgency, burning, also reports brown foul smelling discharge from the vagina with bowel movements. placed on 7 day course of antibiotics, today is day 4 of antibiotics Does the patient have any new or worsening symptoms? ---Yes Will a triage be completed? ---Yes Related visit to physician within the last 2 weeks? ---Yes Does the PT have any chronic conditions? (i.e. diabetes, asthma, this includes High risk factors for pregnancy,  etc.) ---Yes List chronic conditions. ---hypothyroid htn high cholesterol hyperparathyroid Is this a behavioral health or substance abuse call? ---No Guidelines Guideline Title Affirmed Question Affirmed Notes Nurse Date/Time Eilene Ghazi Time) Urinary Tract Infection on [1] Taking antibiotic > 72 hours (3 days) Altamease Oiler, RN, [place] 08/11/2021 8:43:21 AM PLEASE NOTE: All timestamps contained within this report are represented as [place] Standard Time. CONFIDENTIALTY NOTICE: This fax transmission is intended only for the addressee. It contains information that is legally privileged, confidential or otherwise protected from use or disclosure. If you are not the intended recipient, you are strictly prohibited from reviewing, disclosing, copying using or disseminating any of this information or taking any action in reliance on or regarding this information. If you have received this fax in error, please notify us immediately by telephone so that we can arrange for its return to [place]. Phone: [phone], Toll-Free: [phone], Fax: [phone] Page: 2 of 2 Call Id: [id number] Guidelines Guideline Title Affirmed Question Affirmed Notes Nurse Date/Time Eilene Ghazi Time) Antibiotic Follow-up Call - Female for UTI AND [2] painful urination or frequency not improved Disp. Time Eilene Ghazi Time) Disposition Final User 08/11/2021 8:48:19 AM See PCP within 24 Hours Yes Altamease Oiler, RN, Fabio Bering Caller Disagree/Comply Comply Caller Understands Yes PreDisposition Call Doctor Care Advice Given Per Guideline SEE PCP WITHIN 24 HOURS: CONTINUE ANTIBIOTICS: * Continue taking your antibiotics according to the directions the doctor gave you. CALL BACK IF: * You become worse CARE ADVICE given per Urinary Tract Infection on Antibiotic Follow-Up Call, Female (Adult) guideline. Comments User: Kizzie Fantasia, RN Date/Time Eilene Ghazi Time): 08/11/2021 8:49:12 AM transferred caller to the office. spoke to veronica. scheduled at  1120. Referrals REFERRED TO PCP OFFIC

## 2021-08-11 NOTE — Assessment & Plan Note (Signed)
Urine culture and micro today pending results to change treatment plan.

## 2021-08-11 NOTE — Progress Notes (Signed)
Established Patient Office Visit  Subjective:  Patient ID: Mercedes [name], female    DOB: [birth year]  Age: 76 y.o. MRN: [id number]  CC:  Chief Complaint  Patient presents with   Urinary Tract Infection    Went to UC 08-08-21. Dx with UTI and placed on cefdinir and pyridium. States she is not feeling any better.    HPI Sheral Pfahler Totty is here today with concerns.   About five days ago started with dysuria, urinary frequency and urgency. Also experiencing lower abdominal pressure. She did go to the urgent care and was given cefdinir and pyridium. Not feeling much improvement and has been on medication for about three days so far.   Doesn't know if exactly she is having vaginal discharge, doesn't think she does.   She is also concerned because she has noticed discharge only when she is having a bowel movement. The discharge doesn't seem to be associated with the stool and she suspects it is vaginal discharge, and the discharge has a foul odor that did start about six months ago that she has not come into the office for until now to discuss. She did come in for UTI back in July and wet prep was negative as she was also having some vaginal discharge at that time.  May 2022 colonoscopy with five benign polyps. Repeat again in three years. Was done by Dr. Owens Loffler.   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   HLD (hyperlipidemia)    under control   HTN (hypertension)    Hypothyroidism    Macular degeneration    Osteoarthritis    Rapid heartbeat    occasional, does not last long   Tobacco abuse     Past Surgical History:  Procedure Laterality Date   CATARACT EXTRACTION     CHOLECYSTECTOMY N/A 03/17/2014   Procedure: LAPAROSCOPIC CHOLECYSTECTOMY WITH INTRAOPERATIVE CHOLANGIOGRAM ;  Surgeon:  Fanny Skates, MD;  Location: [facility];  Service: General;  Laterality: N/A;   COLONOSCOPY     removed polyps   SIGMOIDOSCOPY      Family History  Problem Relation Age of Onset   Ulcers Father    Angina Mother    Hypertension Brother    Prostate cancer Brother    Colon cancer Neg Hx    Breast cancer Neg Hx    Esophageal cancer Neg Hx    Stomach cancer Neg Hx    Rectal cancer Neg Hx     Social History   Socioeconomic History   Marital status: Married    Spouse name: Not on file   Number of children: 2   Years of education: Not on file   Highest education level: Not on file  Occupational History   Occupation: Teacher-retired  Tobacco Use   Smoking status: Former    Packs/day: 0.25    Years: 15.00    Pack years: 3.75    Types: Cigarettes    Quit date: 07/04/2010    Years since quitting: 11.1   Smokeless tobacco: Never  Vaping Use   Vaping Use: Never used  Substance and Sexual Activity   Alcohol use: Yes    Comment: Wine, 1 per day   Drug use: No   Sexual activity: Never  Other Topics Concern   Not on file  Social History Narrative   Married 1964   2 kids, in [place].  2 grandkids   Volunteered prev with [employer]   Enjoyed golf but gave it up after her husband had a stroke   Social Determinants of Radio broadcast assistant Strain: Not on file  Food Insecurity: Not on file  Transportation Needs: Not on file  Physical Activity: Not on file  Stress: Not on file  Social Connections: Not on file  Intimate Partner Violence: Not on file    Outpatient Medications Prior to Visit  Medication Sig Dispense Refill   Aflibercept (EYLEA) 2 MG/0.05ML SOLN Place 2 mg into the left eye every 30 (thirty) days.     cholecalciferol (VITAMIN D) 1000 units tablet Take 1,000 Units by mouth 2 (two) times a day.      colchicine 0.6 MG tablet Take 1 tablet by mouth twice daily as needed 60 tablet 0   levothyroxine (SYNTHROID) 200 MCG tablet TAKE 1 TABLET BY MOUTH ONCE  DAILY BEFORE BREAKFAST 90 tablet 0   levothyroxine (SYNTHROID) 25 MCG tablet TAKE 1 TABLET BY MOUTH ONCE DAILY BEFORE  BREAKFAST  ON  MONDAY  THROUGH  FRIDAYS  SKIP  ON  SATURADAY  AND  SUNDAY 90 tablet 0   metoprolol (TOPROL-XL) 200 MG 24 hr tablet TAKE 1 TABLET BY MOUTH IN THE MORNING 90 tablet 1   Multiple Vitamin (MULTIVITAMIN WITH MINERALS) TABS tablet Take 1 tablet by mouth daily.     Multiple Vitamins-Minerals (PRESERVISION AREDS 2) CAPS Take 1 capsule by mouth 2 (two) times daily.     naproxen sodium (ALEVE) 220 MG tablet Take 220 mg by mouth in the morning and at bedtime.     Omega 3 1000 MG CAPS Take 2,000 mg by mouth daily.     phenazopyridine (PYRIDIUM) 200 MG tablet Take 1 tablet (200 mg total) by mouth 3 (three) times daily as needed for pain. 6 tablet 0   Potassium 99 MG TABS Take 99 mg by mouth every morning.     pravastatin (PRAVACHOL) 40 MG tablet Take 1 tablet by mouth once daily 90 tablet 1   psyllium (REGULOID) 0.52 G capsule Take 0.52 g by mouth daily. Two tablets per day     zinc gluconate 50 MG tablet Take by mouth.     cefdinir (OMNICEF) 300 MG capsule Take 1 capsule (300 mg total) by mouth 2 (two) times daily for 7 days. 14 capsule 0   COVID-19 mRNA bivalent vaccine, Pfizer, (PFIZER COVID-19 VAC BIVALENT) injection Inject into the muscle. 0.3 mL 0   COVID-19 mRNA Vac-TriS, Pfizer, SUSP injection Inject into the muscle. 0.3 mL 0   No facility-administered medications prior to visit.    Allergies  Allergen Reactions   Ace Inhibitors     REACTION: cough   Clarithromycin     REACTION: GI upset   Fenofibrate     REACTION: itching- pt unsure   Lipitor [Atorvastatin]     myalgias   Niacin     "burning", made pt pass out   Penicillins     REACTION: Purpura   Sulfasalazine     REACTION: Purpura   Tetracycline Hcl     REACTION: GI upset    ROS Review of Systems  Constitutional:  Negative for chills and fever.  Gastrointestinal:  Negative for abdominal pain,  blood in stool, constipation, diarrhea and nausea.  Genitourinary:  Positive for dysuria, frequency,  urgency and vaginal discharge (brownish foul smelling). Negative for difficulty urinating, flank pain, hematuria, pelvic pain and vaginal bleeding.  Musculoskeletal:  Negative for arthralgias.     Objective:    Physical Exam Constitutional:      General: She is not in acute distress.    Appearance: She is obese. She is not ill-appearing, toxic-appearing or diaphoretic.  Cardiovascular:     Rate and Rhythm: Normal rate and regular rhythm.  Pulmonary:     Effort: Pulmonary effort is normal.     Breath sounds: Normal breath sounds.  Abdominal:     Tenderness: There is abdominal tenderness (suprapubic tenderness, mild on palpation).  Genitourinary:    General: Normal vulva.     Labia:        Right: No rash, tenderness or lesion.        Left: No rash, tenderness or lesion.      Urethra: No prolapse, urethral pain or urethral lesion.     Vagina: Vaginal discharge (yellow and thick) present. No erythema, bleeding, lesions or prolapsed vaginal walls.     Cervix: Normal.     Rectum: External hemorrhoid (2 nonthrombosed nontender) present.  Skin:    General: Skin is warm.  Neurological:     Mental Status: She is alert.    BP 130/80 (BP Location: Left Arm, Patient Position: Sitting, Cuff Size: Large)    Pulse 81    Temp (!) 96.6 F (35.9 C)    Ht 5\' 4"  (1.626 m)    Wt 213 lb (96.6 kg)    SpO2 97%    BMI 36.56 kg/m  Wt Readings from Last 3 Encounters:  08/11/21 213 lb (96.6 kg)  08/09/21 223 lb 15.8 oz (101.6 kg)  01/06/21 224 lb (101.6 kg)     There are no preventive care reminders to display for this patient.  There are no preventive care reminders to display for this patient.  Lab Results  Component Value Date   TSH 1.08 12/05/2020   Lab Results  Component Value Date   WBC 6.0 12/05/2020   HGB 14.9 12/05/2020   HCT 43.2 12/05/2020   MCV 97.3 12/05/2020   PLT 206.0  12/05/2020   Lab Results  Component Value Date   NA 140 06/01/2021   K 4.8 06/01/2021   CO2 31 06/01/2021   GLUCOSE 103 (H) 06/01/2021   BUN 22 06/01/2021   CREATININE 0.83 06/01/2021   BILITOT 1.2 06/01/2021   ALKPHOS 73 06/01/2021   AST 20 06/01/2021   ALT 23 06/01/2021   PROT 7.5 06/01/2021   ALBUMIN 4.3 06/01/2021   CALCIUM 10.3 06/01/2021   CALCIUM 10.9 (H) 06/01/2021   ANIONGAP 10 03/14/2020   GFR 67.87 06/01/2021   Lab Results  Component Value Date   HGBA1C 6.0 12/05/2020      Assessment & Plan:   Problem List Items Addressed This Visit       Genitourinary   Acute cystitis with hematuria    Stop cefdinir, change to ciprofloxacin. Pt to take as directed. pending urine again, pending culture.         Other   Dysuria - Primary    Urine culture and micro today pending results to change treatment plan.       Relevant Medications   ciprofloxacin (CIPRO) 500 MG tablet   Other Relevant Orders   Urine Culture   Urinalysis, Routine w reflex microscopic (Completed)   Vaginal discharge    Wet prep today in office as well  as referral to urogyn for further eval/treat. Pt with ongoing vaginal discharge x six months.       Relevant Orders   WET PREP BY MOLECULAR PROBE    Meds ordered this encounter  Medications   ciprofloxacin (CIPRO) 500 MG tablet    Sig: Take 1 tablet (500 mg total) by mouth 2 (two) times daily for 3 days.    Dispense:  6 tablet    Refill:  0    Order Specific Question:   Supervising Provider    Answer:   BEDSOLE, AMY E [[id number]]    Follow-up: Return if symptoms worsen or fail to improve.    Eugenia Pancoast, FNP

## 2021-08-11 NOTE — Patient Instructions (Signed)
A referral was placed today for urogynecology.  Please let us know if you have not heard back within 1 week about your referral.  It was a pleasure seeing you today! Please do not hesitate to reach out with any questions and or concerns.  Regards,   Eugenia Pancoast FNP-C

## 2021-08-12 LAB — URINE CULTURE: Culture: 10000 — AB

## 2021-08-13 NOTE — Telephone Encounter (Signed)
Noted.  Thanks.  There was a referral in the note for urology.  You mentioned urogyn in your note.  I preemptively changed the referral.  Let me know if this isn't okay.

## 2021-08-14 ENCOUNTER — Encounter: Payer: Self-pay | Admitting: Family

## 2021-08-14 LAB — WET PREP BY MOLECULAR PROBE
Candida species: NOT DETECTED
Gardnerella vaginalis: NOT DETECTED
MICRO NUMBER:: 12993383
SPECIMEN QUALITY:: ADEQUATE
Trichomonas vaginosis: NOT DETECTED

## 2021-08-14 LAB — URINE CULTURE
MICRO NUMBER:: 12993384
SPECIMEN QUALITY:: ADEQUATE

## 2021-08-15 NOTE — Telephone Encounter (Signed)
D/w Phebe Colla, agreed with urogyn referral as ordered.

## 2021-08-18 ENCOUNTER — Ambulatory Visit: Payer: PPO | Admitting: Family Medicine

## 2021-08-28 ENCOUNTER — Other Ambulatory Visit: Payer: Self-pay | Admitting: Family Medicine

## 2021-09-04 ENCOUNTER — Ambulatory Visit: Payer: PPO | Admitting: Family Medicine

## 2021-09-05 DIAGNOSIS — H353221 Exudative age-related macular degeneration, left eye, with active choroidal neovascularization: Secondary | ICD-10-CM | POA: Diagnosis not present

## 2021-09-14 DIAGNOSIS — M48062 Spinal stenosis, lumbar region with neurogenic claudication: Secondary | ICD-10-CM | POA: Diagnosis not present

## 2021-09-14 DIAGNOSIS — M5416 Radiculopathy, lumbar region: Secondary | ICD-10-CM | POA: Diagnosis not present

## 2021-09-20 ENCOUNTER — Other Ambulatory Visit: Payer: Self-pay

## 2021-09-20 ENCOUNTER — Ambulatory Visit (INDEPENDENT_AMBULATORY_CARE_PROVIDER_SITE_OTHER): Payer: PPO | Admitting: Obstetrics and Gynecology

## 2021-09-20 ENCOUNTER — Encounter: Payer: Self-pay | Admitting: Obstetrics and Gynecology

## 2021-09-20 VITALS — BP 172/82 | HR 74 | Ht 64.0 in | Wt 212.0 lb

## 2021-09-20 DIAGNOSIS — R3915 Urgency of urination: Secondary | ICD-10-CM

## 2021-09-20 DIAGNOSIS — N390 Urinary tract infection, site not specified: Secondary | ICD-10-CM | POA: Diagnosis not present

## 2021-09-20 DIAGNOSIS — R159 Full incontinence of feces: Secondary | ICD-10-CM

## 2021-09-20 DIAGNOSIS — R350 Frequency of micturition: Secondary | ICD-10-CM | POA: Diagnosis not present

## 2021-09-20 LAB — POCT URINALYSIS DIPSTICK
Appearance: NORMAL
Bilirubin, UA: NEGATIVE
Blood, UA: NEGATIVE
Glucose, UA: NEGATIVE
Ketones, UA: NEGATIVE
Nitrite, UA: NEGATIVE
Protein, UA: NEGATIVE
Spec Grav, UA: >=1.03 — AB (ref 1.010–1.025)
Urobilinogen, UA: 0.2 E.U./dL
pH, UA: 5.5 (ref 5.0–8.0)

## 2021-09-20 MED ORDER — ESTRADIOL 0.1 MG/GM VA CREA: 0.5000 g | TOPICAL_CREAM | VAGINAL | 11 refills | Status: DC

## 2021-09-20 NOTE — Patient Instructions (Addendum)
Supplement for bladder infection prevention: D- mannose.  ? ?Start vaginal estrogen therapy nightly for two weeks then 2 times weekly at night for treatment of vaginal atrophy (dryness of the vaginal tissues).  Please let us know if the prescription is too expensive and we can look for alternative options.  ? ?

## 2021-09-20 NOTE — Progress Notes (Signed)
[facility] ?New Patient Evaluation and Consultation ? ?Referring Provider: Tonia Ghent, MD ?PCP: Tonia Ghent, MD ?Date of Service: 09/20/2021 ? ?SUBJECTIVE ?Chief Complaint: New Patient (Initial Visit) Mercedes [name] is a 76 y.o. female complains of frequent UTI. Pt complains of frequent urination) ? ?History of Present Illness: Mercedes [name] is a 76 y.o. White or Caucasian female seen in consultation at the request of Dr. Damita Dunnings for evaluation of recurrent urinary tract infections and vaginal discharge.   ? ?Review of records significant for: ?08/11/21- >100,000 Enterobacter ?08/09/21- 10,000 E. Coli ?01/06/21- mixed urogenital flora ? ?Wet prep done for vaginal discharge which was negative ? ?Urinary Symptoms: ?Does not leak urine.  ?Reports bladder spasm and urgency at times without leakage.  ?Day time voids 4.  Nocturia: 3 times per night to void. ?Voiding dysfunction: she empties her bladder well.  ?does not use a catheter to empty bladder.  ?When urinating, she feels the need to urinate multiple times in a row ?Drinks: tea throughout the day  ? ?UTIs: 3-4 UTI's in the last year.  Just started a cranberry supplement recently.  ?Denies history of blood in urine and kidney or bladder stones ? ?Pelvic Organ Prolapse Symptoms:                  ?She Denies a feeling of a bulge the vaginal area. ? ?Bowel Symptom: ?Bowel movements: 3 time(s) per day ?Stool consistency: soft  ?Straining: no.  ?Splinting: no.  ?Incomplete evacuation: yes.  ?She Admits to accidental bowel leakage / fecal incontinence ? Occurs: infrequently- a few times per year ? Consistency with leakage: soft  ?Bowel regimen: fiber ?Last colonoscopy: Date 2022, Results negative ?Has a lot of rectal flatulence.  ? ?Has noticed a vaginal discharge that is foul smelling that occurred with the bowel movement. It was brown in color. Since she has taken ciprofloxacin, she has not seen any of the discharge. Noticed with wiping only.   ? ?Sexual Function ?Sexually active: no.  ? ? ?Pelvic Pain ?Denies pelvic pain ? ? ? ?Past Medical History:  ?Past Medical History:  ?Diagnosis Date  ? Allergy   ? Cataract   ? bilateral-not an issue right now  ? Diverticulosis of colon   ? Fatty liver   ? GERD (gastroesophageal reflux disease)   ? Gout   ? Headache(784.0)   ? cluster  ? HLD (hyperlipidemia)   ? under control  ? HTN (hypertension)   ? Hypothyroidism   ? Macular degeneration   ? Osteoarthritis   ? Rapid heartbeat   ? occasional, does not last long  ? Tobacco abuse   ? ? ? ?Past Surgical History:   ?Past Surgical History:  ?Procedure Laterality Date  ? CATARACT EXTRACTION    ? CHOLECYSTECTOMY N/A 03/17/2014  ? Procedure: LAPAROSCOPIC CHOLECYSTECTOMY WITH INTRAOPERATIVE CHOLANGIOGRAM ;  Surgeon: Fanny Skates, MD;  Location: [facility];  Service: General;  Laterality: N/A;  ? COLONOSCOPY    ? removed polyps  ? SIGMOIDOSCOPY    ? ? ? ?Past OB/GYN History: ?OB History  ?Gravida Para Term Preterm AB Living  ?2         2  ?SAB IAB Ectopic Multiple Live Births  ?        2  ?  ?# Outcome Date GA Lbr Len/2nd Weight Sex Delivery Anes PTL Lv  ?2 Gravida           ?1 Gravida           ? ? ?  Vaginal deliveries: 2 ?Menopausal: Yes, at age 37, Denies vaginal bleeding since menopause ?Last pap smear was at age 51.   ? ? ?Medications: She has a current medication list which includes the following prescription(s): eylea, cholecalciferol, colchicine, [START ON 09/21/2021] estradiol, levothyroxine, levothyroxine, metoprolol, multivitamin with minerals, preservision areds 2, naproxen sodium, omega 3, phenazopyridine, potassium, pravastatin, psyllium, and zinc gluconate.  ? ?Allergies: Patient is allergic to ace inhibitors, clarithromycin, fenofibrate, lipitor [atorvastatin], niacin, penicillins, sulfasalazine, and tetracycline hcl.  ? ?Social History:  ?Social History  ? ?Tobacco Use  ? Smoking status: Former  ?  Packs/day: 0.25  ?  Years: 15.00  ?  Pack years: 3.75  ?   Types: Cigarettes  ?  Quit date: 07/04/2010  ?  Years since quitting: 11.2  ? Smokeless tobacco: Never  ?Vaping Use  ? Vaping Use: Never used  ?Substance Use Topics  ? Alcohol use: Yes  ?  Comment: Wine, 1 per day  ? Drug use: No  ? ? ?Relationship status: married ?She lives with her spouse.   ?She is not employed. ?Regular exercise: No ?History of abuse: No ? ?Family History:   ?Family History  ?Problem Relation Age of Onset  ? Ulcers Father   ? Angina Mother   ? Hypertension Brother   ? Prostate cancer Brother   ? Colon cancer Neg Hx   ? Breast cancer Neg Hx   ? Esophageal cancer Neg Hx   ? Stomach cancer Neg Hx   ? Rectal cancer Neg Hx   ? ? ? ?Review of Systems: ROS ? ? ?OBJECTIVE ?Physical Exam: ?Vitals:  ? 09/20/21 1521  ?BP: (!) 172/82  ?Pulse: 74  ?Weight: 212 lb (96.2 kg)  ?Height: '5\' 4"'$  (1.626 m)  ? ? ?Physical Exam ? ? ?GU / Detailed Urogynecologic Evaluation:  ?Pelvic Exam: Normal external female genitalia; Bartholin's and Skene's glands normal in appearance; urethral meatus normal in appearance, no urethral masses or discharge.  ? ?CST: negative ? ?Speculum exam reveals normal vaginal mucosa with atrophy. Cervix normal appearance. No discharge noted. Uterus normal single, nontender. Adnexa no mass, fullness, tenderness.   ? ? ?Pelvic floor strength I/V, puborectalis I/V external anal sphincter I/V ? ?Pelvic floor musculature: Right levator non-tender, Right obturator non-tender, Left levator non-tender, Left obturator non-tender ? ?POP-Q:  ? ?POP-Q ? ?-3  ?                                          Aa   ?-3 ?                                          Ba  ?-6.5  ?                                            C  ? ?3  ?                                          Gh  ?4  ?  Pb  ?9  ?                                          tvl  ? ?-2  ?                                          Ap  ?-2  ?                                          Bp  ?-9  ?                                             D  ? ? ? ?Rectal Exam:  ?Normal sphincter tone, no distal rectocele, enterocoele present, no rectal masses, no sign of dyssynergia when asking the patient to bear down. ? ?Post-Void Residual (PVR) by Bladder Scan: ?In order to evaluate bladder emptying, we discussed obtaining a postvoid residual and she agreed to this procedure. ? ?Procedure: The ultrasound unit was placed on the patient's abdomen in the suprapubic region after the patient had voided. A PVR of 119 ml was obtained by bladder scan. ? ?Laboratory Results: ?POC urine: trace leukocytes ? ? ?ASSESSMENT AND PLAN ?Ms. Fabro is a 76 y.o. with:  ?1. Recurrent UTI   ?2. Urinary frequency   ?3. Urinary urgency   ?4. Incontinence of feces, unspecified fecal incontinence type   ? ?rUTI ?- For treatment of recurrent urinary tract infections, we discussed management of recurrent UTIs including prophylaxis with a daily low dose antibiotic, transvaginal estrogen therapy, D-mannose, and cranberry supplements.   ?- She will start with estrace cream 0.5g nightly for two weeks then twice weekly after.  ?- She will also continue cranberry pills and look into getting D-mannose ?- Obtain urine cultures when she is symptomatic.  ? ?2. Urinary urgency ?- For bladder urgency, avoid tea intake or other bladder irritants.  ? ?3. Fecal incontinence ?- Discussed referral to pelvic floor PT, but she does not feel this happens enough to pursue treatment at this time.  ? ?Return 6 months or sooner if needed ? ?Jaquita Folds, MD ? ? ? ? ? ?

## 2021-09-29 ENCOUNTER — Telehealth: Payer: Self-pay

## 2021-09-29 DIAGNOSIS — N390 Urinary tract infection, site not specified: Secondary | ICD-10-CM

## 2021-09-29 NOTE — Telephone Encounter (Signed)
Mercedes [name] is a 75 y.o. female call in because has been using the estradiol cream for 3 days and has been experiencing burning. Pt said she stopped the estradiol cream and burning stopped. I told the patient I would send a message to Dr. Wannetta Sender and when she gets back in 10/03/21 she will decide if she wants to change to another medication. Pt verbalized under standing and agreed. ? ?

## 2021-10-02 MED ORDER — ESTRADIOL 10 MCG VA TABS: 1.0000 | ORAL_TABLET | VAGINAL | 11 refills | Status: DC

## 2021-10-02 NOTE — Telephone Encounter (Signed)
Pt was contacted and said she would like to try the tablet. ?

## 2021-10-02 NOTE — Telephone Encounter (Signed)
Yuvafem '10mg'$  twice weekly ordered to pharmacy.  ?

## 2021-10-02 NOTE — Addendum Note (Signed)
Addended by: Jaquita Folds on: 10/02/2021 03:29 PM ? ? Modules accepted: Orders ? ?

## 2021-10-02 NOTE — Telephone Encounter (Signed)
Please let patient know I can prescribe a vaginal tablet (twice a week) or a vaginal ring (once every 3 months) as an alternative. The tablets she would place herself. If she cannot place the ring she can come to the office every 3 months to have it placed.  ? ? ?

## 2021-10-03 DIAGNOSIS — H353221 Exudative age-related macular degeneration, left eye, with active choroidal neovascularization: Secondary | ICD-10-CM | POA: Diagnosis not present

## 2021-10-04 NOTE — Telephone Encounter (Signed)
Pt was notified.  

## 2021-10-13 DIAGNOSIS — M5416 Radiculopathy, lumbar region: Secondary | ICD-10-CM | POA: Diagnosis not present

## 2021-10-13 DIAGNOSIS — M48062 Spinal stenosis, lumbar region with neurogenic claudication: Secondary | ICD-10-CM | POA: Diagnosis not present

## 2021-10-13 DIAGNOSIS — M5136 Other intervertebral disc degeneration, lumbar region: Secondary | ICD-10-CM | POA: Diagnosis not present

## 2021-10-14 ENCOUNTER — Other Ambulatory Visit: Payer: Self-pay | Admitting: Family Medicine

## 2021-10-27 ENCOUNTER — Ambulatory Visit: Payer: PPO | Admitting: Obstetrics and Gynecology

## 2021-10-27 DIAGNOSIS — M48062 Spinal stenosis, lumbar region with neurogenic claudication: Secondary | ICD-10-CM | POA: Diagnosis not present

## 2021-10-27 DIAGNOSIS — M5416 Radiculopathy, lumbar region: Secondary | ICD-10-CM | POA: Diagnosis not present

## 2021-10-31 DIAGNOSIS — H353221 Exudative age-related macular degeneration, left eye, with active choroidal neovascularization: Secondary | ICD-10-CM | POA: Diagnosis not present

## 2021-11-06 ENCOUNTER — Ambulatory Visit (INDEPENDENT_AMBULATORY_CARE_PROVIDER_SITE_OTHER): Payer: PPO | Admitting: Family

## 2021-11-06 ENCOUNTER — Telehealth: Payer: Self-pay

## 2021-11-06 ENCOUNTER — Encounter: Payer: Self-pay | Admitting: Family

## 2021-11-06 ENCOUNTER — Ambulatory Visit (INDEPENDENT_AMBULATORY_CARE_PROVIDER_SITE_OTHER)
Admission: RE | Admit: 2021-11-06 | Discharge: 2021-11-06 | Disposition: A | Discharge status: Home / Self Care | Payer: PPO | Source: Ambulatory Visit | Attending: Family | Admitting: Family

## 2021-11-06 VITALS — BP 140/86 | HR 70 | Temp 98.5°F | Resp 16 | Ht 64.0 in | Wt 216.4 lb

## 2021-11-06 DIAGNOSIS — M7989 Other specified soft tissue disorders: Secondary | ICD-10-CM | POA: Diagnosis not present

## 2021-11-06 DIAGNOSIS — M109 Gout, unspecified: Secondary | ICD-10-CM

## 2021-11-06 DIAGNOSIS — M79644 Pain in right finger(s): Secondary | ICD-10-CM | POA: Diagnosis not present

## 2021-11-06 LAB — CBC WITH DIFFERENTIAL/PLATELET
Basophils Absolute: 0 10*3/uL (ref 0.0–0.1)
Basophils Relative: 0.5 % (ref 0.0–3.0)
Eosinophils Absolute: 0.1 10*3/uL (ref 0.0–0.7)
Eosinophils Relative: 1.3 % (ref 0.0–5.0)
HCT: 45.1 % (ref 36.0–46.0)
Hemoglobin: 15.6 g/dL — ABNORMAL HIGH (ref 12.0–15.0)
Lymphocytes Relative: 37.4 % (ref 12.0–46.0)
Lymphs Abs: 2.7 10*3/uL (ref 0.7–4.0)
MCHC: 34.6 g/dL (ref 30.0–36.0)
MCV: 98.3 fl (ref 78.0–100.0)
Monocytes Absolute: 0.9 10*3/uL (ref 0.1–1.0)
Monocytes Relative: 12 % (ref 3.0–12.0)
Neutro Abs: 3.5 10*3/uL (ref 1.4–7.7)
Neutrophils Relative %: 48.8 % (ref 43.0–77.0)
Platelets: 200 10*3/uL (ref 150.0–400.0)
RBC: 4.58 Mil/uL (ref 3.87–5.11)
RDW: 12.8 % (ref 11.5–15.5)
WBC: 7.3 10*3/uL (ref 4.0–10.5)

## 2021-11-06 LAB — URIC ACID: Uric Acid, Serum: 7.2 mg/dL — ABNORMAL HIGH (ref 2.4–7.0)

## 2021-11-06 MED ORDER — COLCHICINE 0.6 MG PO TABS: ORAL_TABLET | ORAL | 0 refills | Status: DC

## 2021-11-06 NOTE — Progress Notes (Signed)
? ?Established Patient Office Visit ? ?Subjective:  ?Patient ID: Mercedes [name], female    DOB: [birth year]  Age: 76 y.o. MRN: [id number] ? ?CC:  ?Chief Complaint  ?Patient presents with  ? Hand Pain  ?  X 10 days swelling, hot to touch, can not bend, and throbbing.  ? ? ?HPI ?Mercedes [name] is here today with concerns.  ? ?Right second digit with throbbing, increased swelling and redness. Tender to touch. Hard to bend. No known injury, does use as her 'computer mouse' finger.  ? ?Does have h/o gout. ?Does have h/o colchicine which she has used in past for gout, but is expired.  ? ?Past Medical History:  ?Diagnosis Date  ? Allergy   ? Cataract   ? bilateral-not an issue right now  ? Diverticulosis of colon   ? Fatty liver   ? GERD (gastroesophageal reflux disease)   ? Gout   ? Headache(784.0)   ? cluster  ? HLD (hyperlipidemia)   ? under control  ? HTN (hypertension)   ? Hypothyroidism   ? Macular degeneration   ? Osteoarthritis   ? Rapid heartbeat   ? occasional, does not last long  ? Tobacco abuse   ? ? ?Past Surgical History:  ?Procedure Laterality Date  ? CATARACT EXTRACTION    ? CHOLECYSTECTOMY N/A 03/17/2014  ? Procedure: LAPAROSCOPIC CHOLECYSTECTOMY WITH INTRAOPERATIVE CHOLANGIOGRAM ;  Surgeon: Fanny Skates, MD;  Location: [facility];  Service: General;  Laterality: N/A;  ? COLONOSCOPY    ? removed polyps  ? SIGMOIDOSCOPY    ? ? ?Family History  ?Problem Relation Age of Onset  ? Ulcers Father   ? Angina Mother   ? Hypertension Brother   ? Prostate cancer Brother   ? Colon cancer Neg Hx   ? Breast cancer Neg Hx   ? Esophageal cancer Neg Hx   ? Stomach cancer Neg Hx   ? Rectal cancer Neg Hx   ? ? ?Social History  ? ?Socioeconomic History  ? Marital status: Married  ?  Spouse name: Not on file  ? Number of children: 2  ? Years of education: Not on file  ? Highest education level: Not on file  ?Occupational History  ? Occupation: Teacher-retired  ?Tobacco Use  ? Smoking status: Former  ?  Packs/day: 0.25  ?   Years: 15.00  ?  Pack years: 3.75  ?  Types: Cigarettes  ?  Quit date: 07/04/2010  ?  Years since quitting: 11.3  ? Smokeless tobacco: Never  ?Vaping Use  ? Vaping Use: Never used  ?Substance and Sexual Activity  ? Alcohol use: Yes  ?  Comment: Wine, 1 per day  ? Drug use: No  ? Sexual activity: Not Currently  ?Other Topics Concern  ? Not on file  ?Social History Narrative  ? Married 1964  ? 2 kids, in [place].  2 grandkids  ? Volunteered prev with [employer]  ? Enjoyed golf but gave it up after her husband had a stroke  ? ?Social Determinants of Health  ? ?Financial Resource Strain: Not on file  ?Food Insecurity: Not on file  ?Transportation Needs: Not on file  ?Physical Activity: Not on file  ?Stress: Not on file  ?Social Connections: Not on file  ?Intimate Partner Violence: Not on file  ? ? ?Outpatient Medications Prior to Visit  ?Medication Sig Dispense Refill  ? Aflibercept (EYLEA) 2 MG/0.05ML SOLN Place 2 mg into the left eye every 30 (thirty)  days.    ? cholecalciferol (VITAMIN D) 1000 units tablet Take 1,000 Units by mouth 2 (two) times a day.     ? levothyroxine (SYNTHROID) 200 MCG tablet TAKE 1 TABLET BY MOUTH ONCE DAILY BEFORE BREAKFAST 90 tablet 0  ? levothyroxine (SYNTHROID) 25 MCG tablet TAKE 1 TABLET BY MOUTH ONCE DAILY BEFORE  BREAKFAST  ON  MONDAY  THROUGH  FRIDAYS SKIP ON SATURDAY AND SUNDAY 90 tablet 0  ? metoprolol (TOPROL-XL) 200 MG 24 hr tablet TAKE 1 TABLET BY MOUTH IN THE MORNING 90 tablet 1  ? Multiple Vitamin (MULTIVITAMIN WITH MINERALS) TABS tablet Take 1 tablet by mouth daily.    ? Multiple Vitamins-Minerals (PRESERVISION AREDS 2) CAPS Take 1 capsule by mouth 2 (two) times daily.    ? naproxen sodium (ALEVE) 220 MG tablet Take 220 mg by mouth in the morning and at bedtime.    ? Omega 3 1000 MG CAPS Take 2,000 mg by mouth daily.    ? phenazopyridine (PYRIDIUM) 200 MG tablet Take 1 tablet (200 mg total) by mouth 3 (three) times daily as needed for pain. 6 tablet 0  ? Potassium 99 MG  TABS Take 99 mg by mouth every morning.    ? pravastatin (PRAVACHOL) 40 MG tablet Take 1 tablet by mouth once daily 90 tablet 1  ? psyllium (REGULOID) 0.52 G capsule Take 0.52 g by mouth daily. Two tablets per day    ? zinc gluconate 50 MG tablet Take by mouth.    ? colchicine 0.6 MG tablet Take 1 tablet by mouth twice daily as needed 60 tablet 0  ? estradiol (ESTRACE) 0.1 MG/GM vaginal cream Place 0.5 g vaginally 2 (two) times a week. Place 0.5g nightly for two weeks then twice a week after 30 g 11  ? Estradiol (YUVAFEM) 10 MCG TABS vaginal tablet Place 1 tablet (10 mcg total) vaginally 2 (two) times a week. 8 tablet 11  ? ?No facility-administered medications prior to visit.  ? ? ?Allergies  ?Allergen Reactions  ? Ace Inhibitors   ?  REACTION: cough  ? Clarithromycin   ?  REACTION: GI upset  ? Fenofibrate   ?  REACTION: itching- pt unsure  ? Lipitor [Atorvastatin]   ?  myalgias  ? Niacin   ?  "burning", made pt pass out  ? Penicillins   ?  REACTION: Purpura  ? Sulfasalazine   ?  REACTION: Purpura  ? Tetracycline Hcl   ?  REACTION: GI upset  ? ? ?ROS ?Review of Systems  ?Constitutional:  Negative for chills, fatigue and fever.  ?Musculoskeletal:  Positive for arthralgias (right second finger with swelling at all joints, with redness warmth and hard to bed without pain).  ? ?  ?Objective:  ?  ?Physical Exam ?Constitutional:   ?   General: She is not in acute distress. ?   Appearance: Normal appearance. She is obese. She is not ill-appearing, toxic-appearing or diaphoretic.  ?Pulmonary:  ?   Effort: Pulmonary effort is normal.  ?Musculoskeletal:  ?   Comments: Right second metacarpal with swelling distal to pharylx , hard to bend completely, decreased rom due to edema, redness to right lateral side as well as heat to site.  ? ?Heberdens nodes on multiple digits at distal phalynx  ?Neurological:  ?   Mental Status: She is alert.  ? ? ?BP 140/86   Pulse 70   Temp 98.5 ?F (36.9 ?C)   Resp 16   Ht '5\' 4"'$  (1.626 m)  Wt 216 lb 7 oz (98.2 kg)   SpO2 98%   BMI 37.15 kg/m?  ?Wt Readings from Last 3 Encounters:  ?11/06/21 216 lb 7 oz (98.2 kg)  ?09/20/21 212 lb (96.2 kg)  ?08/11/21 213 lb (96.6 kg)  ? ? ? ?There are no preventive care reminders to display for this patient. ? ?There are no preventive care reminders to display for this patient. ? ?Lab Results  ?Component Value Date  ? TSH 1.08 12/05/2020  ? ?Lab Results  ?Component Value Date  ? WBC 6.0 12/05/2020  ? HGB 14.9 12/05/2020  ? HCT 43.2 12/05/2020  ? MCV 97.3 12/05/2020  ? PLT 206.0 12/05/2020  ? ?Lab Results  ?Component Value Date  ? NA 140 06/01/2021  ? K 4.8 06/01/2021  ? CO2 31 06/01/2021  ? GLUCOSE 103 (H) 06/01/2021  ? BUN 22 06/01/2021  ? CREATININE 0.83 06/01/2021  ? BILITOT 1.2 06/01/2021  ? ALKPHOS 73 06/01/2021  ? AST 20 06/01/2021  ? ALT 23 06/01/2021  ? PROT 7.5 06/01/2021  ? ALBUMIN 4.3 06/01/2021  ? CALCIUM 10.3 06/01/2021  ? CALCIUM 10.9 (H) 06/01/2021  ? ANIONGAP 10 03/14/2020  ? GFR 67.87 06/01/2021  ? ?Lab Results  ?Component Value Date  ? HGBA1C 6.0 12/05/2020  ? ? ?  ?Assessment & Plan:  ? ?Problem List Items Addressed This Visit   ? ?  ? Other  ? Gout - Primary  ?  Right second index finger suspected gout ?Handout sent to mychart ?Uric acid and cbc ordered today as well as right hand xray , suspected gout however r/o osteomyelitis as well  ?If no response to colchicine, consider cellulitis (antbx) ?rx colchicine 0.6 mg ? ?  ?  ? Relevant Orders  ? Uric acid  ? CBC with Differential  ? DG Finger Index Right  ? Swelling of right index finger  ?  Xray finger today pending results ? ?  ?  ? Relevant Medications  ? colchicine 0.6 MG tablet  ? ? ?Meds ordered this encounter  ?Medications  ? colchicine 0.6 MG tablet  ?  Sig: Take 1 tablet by mouth twice daily as needed  ?  Dispense:  60 tablet  ?  Refill:  0  ?  Order Specific Question:   Supervising Provider  ?  Answer:   BEDSOLE, AMY E [[id number]]  ? ? ?Follow-up: Return if symptoms worsen or fail to improve  with pcp.  ? ? ?Eugenia Pancoast, FNP ?

## 2021-11-06 NOTE — Assessment & Plan Note (Signed)
Xray finger today pending results ?

## 2021-11-06 NOTE — Assessment & Plan Note (Signed)
Right second index finger suspected gout ?Handout sent to mychart ?Uric acid and cbc ordered today as well as right hand xray , suspected gout however r/o osteomyelitis as well  ?If no response to colchicine, consider cellulitis (antbx) ?rx colchicine 0.6 mg ?

## 2021-11-06 NOTE — Telephone Encounter (Signed)
Saw patient already, thank you for speaking with the patient.  Please see note for further information.  

## 2021-11-06 NOTE — Telephone Encounter (Signed)
[facility] - Client ?Nonclinical Telephone Record  ?[facility]? ?Client [facility] - Client ?Client Site [facility] ?Provider Renford Dills - MD ?Contact Type Call ?Who Is Calling Patient / Member / Family / Caregiver ?Caller Name Dyllan Kats ?Caller Phone Number [phone] ?Patient Name Mercedes [name] ?Patient DOB 01-Aug-1945 ?Call Type Message Only Information Provided ?Reason for Call Request to Schedule Office Appointment ?Initial Comment Caller states that her right hand index finger is extremely swollen, hot and she is unable ?to bend it. She has been on Alieve every 12 hours and using icy hot. Nothing seems to be ?working and she would like an appointment. ?Patient request to speak to RN No ?Additional Comment Caller declined triage and states that she would prefer to just wait until the office opens in ?order to speak to someone at the office about this issue. She would like to have a call back first ?thing this morning. ?Disp. Time Disposition Final User ?11/06/2021 7:59:57 AM General Information Provided Yes Wynema Birch ?Call Closed By: Wynema Birch ?Transaction Date/Time: 11/06/2021 7:56:17 AM (ET ? ? ?Per chart review tab pt has already seen Red Christians FNP today. Sending note to Red Christians FNP. Kelly CMA. ?

## 2021-11-06 NOTE — Patient Instructions (Signed)
?  Complete xray(s) prior to leaving today. I will notify you of your results once received. ? ?Stop by the lab prior to leaving today. I will notify you of your results once received.  ? ?Due to recent changes in healthcare laws, you may see results of your imaging and/or laboratory studies on MyChart before I have had a chance to review them.  I understand that in some cases there may be results that are confusing or concerning to you. Please understand that not all results are received at the same time and often I may need to interpret multiple results in order to provide you with the best plan of care or course of treatment. Therefore, I ask that you please give me 2 business days to thoroughly review all your results before contacting my office for clarification. Should we see a critical lab result, you will be contacted sooner.  ? ?It was a pleasure seeing you today! Please do not hesitate to reach out with any questions and or concerns. ? ?Regards,  ? ?Mikia Delaluz ?FNP-C ? ?

## 2021-11-07 ENCOUNTER — Telehealth: Payer: Self-pay | Admitting: Family Medicine

## 2021-11-07 ENCOUNTER — Other Ambulatory Visit: Payer: Self-pay | Admitting: Family

## 2021-11-07 DIAGNOSIS — D582 Other hemoglobinopathies: Secondary | ICD-10-CM

## 2021-11-07 NOTE — Telephone Encounter (Signed)
Called and spoke with patient and she stated her needs have already need addressed by someone else and nothing further is needed.  ?

## 2021-11-07 NOTE — Progress Notes (Signed)
Can we add on b12 folate for elevated hemoglobin?  ?Placing future order now if able.

## 2021-11-07 NOTE — Progress Notes (Signed)
Please call and let pt know I want to check her B12 for mildly elevated hemoglobin but unable to add on at lab because their specimen fridge is down.  ? ?I have ordered a future order for this, pt to schedule LAB ONLY app[t at her leisure.

## 2021-11-07 NOTE — Telephone Encounter (Signed)
Pt needs to speak with the nurse, says she missed a call.  ? ?Callback Number: [phone] ?

## 2021-11-08 ENCOUNTER — Other Ambulatory Visit (INDEPENDENT_AMBULATORY_CARE_PROVIDER_SITE_OTHER): Payer: PPO

## 2021-11-08 DIAGNOSIS — D582 Other hemoglobinopathies: Secondary | ICD-10-CM | POA: Diagnosis not present

## 2021-11-08 LAB — B12 AND FOLATE PANEL
Folate: >23.5 ng/mL (ref >5.9)
Vitamin B-12: 469 pg/mL (ref 211–911)

## 2021-11-08 NOTE — Progress Notes (Signed)
There is a lot of arthritic changes in the finger.  ?Are you seeing any improvement with the colchicine or no?  ?There is also plaquing of the arteries in the finger,are you a smoker?

## 2021-11-15 ENCOUNTER — Encounter: Payer: Self-pay | Admitting: Family

## 2021-11-15 DIAGNOSIS — M7989 Other specified soft tissue disorders: Secondary | ICD-10-CM

## 2021-11-15 MED ORDER — CEPHALEXIN 500 MG PO CAPS: 500.0000 mg | ORAL_CAPSULE | Freq: Two times a day (BID) | ORAL | 0 refills | Status: DC

## 2021-11-17 DIAGNOSIS — M5136 Other intervertebral disc degeneration, lumbar region: Secondary | ICD-10-CM | POA: Diagnosis not present

## 2021-11-17 DIAGNOSIS — M5416 Radiculopathy, lumbar region: Secondary | ICD-10-CM | POA: Diagnosis not present

## 2021-11-17 DIAGNOSIS — M5126 Other intervertebral disc displacement, lumbar region: Secondary | ICD-10-CM | POA: Diagnosis not present

## 2021-11-17 DIAGNOSIS — M48062 Spinal stenosis, lumbar region with neurogenic claudication: Secondary | ICD-10-CM | POA: Diagnosis not present

## 2021-11-20 ENCOUNTER — Other Ambulatory Visit: Payer: Self-pay | Admitting: Family

## 2021-11-20 DIAGNOSIS — M7989 Other specified soft tissue disorders: Secondary | ICD-10-CM

## 2021-11-20 MED ORDER — PREDNISONE 20 MG PO TABS: ORAL_TABLET | ORAL | 0 refills | Status: DC

## 2021-12-05 DIAGNOSIS — H353221 Exudative age-related macular degeneration, left eye, with active choroidal neovascularization: Secondary | ICD-10-CM | POA: Diagnosis not present

## 2021-12-12 ENCOUNTER — Encounter: Payer: Self-pay | Admitting: Family Medicine

## 2021-12-12 ENCOUNTER — Ambulatory Visit (INDEPENDENT_AMBULATORY_CARE_PROVIDER_SITE_OTHER): Payer: PPO | Admitting: Family Medicine

## 2021-12-12 VITALS — BP 132/82 | HR 80 | Temp 98.0°F | Ht 64.0 in | Wt 220.0 lb

## 2021-12-12 DIAGNOSIS — M549 Dorsalgia, unspecified: Secondary | ICD-10-CM

## 2021-12-12 DIAGNOSIS — Z0000 Encounter for general adult medical examination without abnormal findings: Secondary | ICD-10-CM | POA: Diagnosis not present

## 2021-12-12 DIAGNOSIS — E785 Hyperlipidemia, unspecified: Secondary | ICD-10-CM

## 2021-12-12 DIAGNOSIS — R739 Hyperglycemia, unspecified: Secondary | ICD-10-CM | POA: Diagnosis not present

## 2021-12-12 DIAGNOSIS — E210 Primary hyperparathyroidism: Secondary | ICD-10-CM | POA: Diagnosis not present

## 2021-12-12 DIAGNOSIS — Z7189 Other specified counseling: Secondary | ICD-10-CM

## 2021-12-12 DIAGNOSIS — I10 Essential (primary) hypertension: Secondary | ICD-10-CM

## 2021-12-12 DIAGNOSIS — E038 Other specified hypothyroidism: Secondary | ICD-10-CM | POA: Diagnosis not present

## 2021-12-12 DIAGNOSIS — M109 Gout, unspecified: Secondary | ICD-10-CM | POA: Diagnosis not present

## 2021-12-12 DIAGNOSIS — M545 Low back pain, unspecified: Secondary | ICD-10-CM

## 2021-12-12 LAB — COMPREHENSIVE METABOLIC PANEL
ALT: 26 U/L (ref 0–35)
AST: 20 U/L (ref 0–37)
Albumin: 4.2 g/dL (ref 3.5–5.2)
Alkaline Phosphatase: 67 U/L (ref 39–117)
BUN: 19 mg/dL (ref 6–23)
CO2: 30 mEq/L (ref 19–32)
Calcium: 10.4 mg/dL (ref 8.4–10.5)
Chloride: 103 mEq/L (ref 96–112)
Creatinine, Ser: 0.83 mg/dL (ref 0.40–1.20)
GFR: 67.61 mL/min (ref >60.00)
Glucose, Bld: 127 mg/dL — ABNORMAL HIGH (ref 70–99)
Potassium: 3.9 mEq/L (ref 3.5–5.1)
Sodium: 141 mEq/L (ref 135–145)
Total Bilirubin: 1.3 mg/dL — ABNORMAL HIGH (ref 0.2–1.2)
Total Protein: 6.7 g/dL (ref 6.0–8.3)

## 2021-12-12 LAB — LDL CHOLESTEROL, DIRECT: Direct LDL: 95 mg/dL

## 2021-12-12 LAB — LIPID PANEL
Cholesterol: 180 mg/dL (ref 0–200)
HDL: 42.6 mg/dL (ref >39.00)
Total CHOL/HDL Ratio: 4
Triglycerides: 574 mg/dL — ABNORMAL HIGH (ref 0.0–149.0)

## 2021-12-12 LAB — VITAMIN D 25 HYDROXY (VIT D DEFICIENCY, FRACTURES): VITD: 38.55 ng/mL (ref 30.00–100.00)

## 2021-12-12 LAB — HEMOGLOBIN A1C: Hgb A1c MFr Bld: 5.7 % (ref 4.6–6.5)

## 2021-12-12 LAB — TSH: TSH: 0.72 u[IU]/mL (ref 0.35–5.50)

## 2021-12-12 MED ORDER — LEVOTHYROXINE SODIUM 200 MCG PO TABS: 200.0000 ug | ORAL_TABLET | Freq: Every day | ORAL | 3 refills | Status: DC

## 2021-12-12 MED ORDER — LEVOTHYROXINE SODIUM 25 MCG PO TABS: ORAL_TABLET | ORAL | 3 refills | Status: DC

## 2021-12-12 MED ORDER — VABYSMO 6 MG/0.05ML IZ SOLN: 6.0000 mg | INTRAVITREAL | 0 refills | Status: AC

## 2021-12-12 MED ORDER — PRAVASTATIN SODIUM 40 MG PO TABS: 40.0000 mg | ORAL_TABLET | Freq: Every day | ORAL | 3 refills | Status: DC

## 2021-12-12 MED ORDER — METOPROLOL SUCCINATE ER 200 MG PO TB24: 200.0000 mg | ORAL_TABLET | Freq: Every morning | ORAL | 3 refills | Status: DC

## 2021-12-12 NOTE — Progress Notes (Signed)
I have personally reviewed the Medicare Annual Wellness questionnaire and have noted 1. The patient's medical and social history 2. Their use of alcohol, tobacco or illicit drugs 3. Their current medications and supplements 4. The patient's functional ability including ADL's, fall risks, home safety risks and hearing or visual             impairment. 5. Diet and physical activities 6. Evidence for depression or mood disorders  The patients weight, height, BMI have been recorded in the chart and visual acuity is per eye clinic.  I have made referrals, counseling and provided education to the patient based review of the above and I have provided the pt with a written personalized care plan for preventive services.  Provider list updated- see scanned forms.  Routine anticipatory guidance given to patient.  See health maintenance. The possibility exists that previously documented standard health maintenance information may have been brought forward from a previous encounter into this note.  If needed, that same information has been updated to reflect the current situation based on today's encounter.    Flu previously done Shingles previously done PNA previously done Tetanus 2019 COVID-vaccine previously done Colonoscopy 2022 Breast cancer screening 2022 Bone density test 2021 Advance directive- sons Remo Lipps and Shanon Brow equally designated if patient were incapacitated.  Cognitive function addressed- see scanned forms- and if abnormal then additional documentation follows.   In addition to [facility], follow up visit for the below conditions:  Her husband has had some memory lapses, she has noted that and d/w pt.  She noted that she needed to mind him about appointment.  No red flag events.    H/o osteopenia.  DXA 2021.  Hyperparathyroidism hx noted.  Follow-up bone density test ordered and she can call about scheduling.  Gout.  D/w pt about options.  R 2nd finger. No pain now but puffy.   She isn't taking colchicine currently.  D/w pt about seeing rheumatology.    She has done PT about her back, it prev helped.  Not yet on gabapentin.  D/w pt about restarting PT.  D/w pt about prev injections, initial round helped.  Gabapentin cautions d/w pt.  She clearly feels better sitting. More pain standing and leaning forward.    Hypothyroidism.  Still on replacement.  Taking meds in early AM.  Compliant.  No neck mass.    Hypertension:    Using medication without problems or lightheadedness: yes Chest pain with exertion:no Edema:no BLE edema.  Short of breath:no Average home BPs:  Elevated Cholesterol: Using medications without problems: yes Muscle aches: likely not from statin.   Diet compliance: d/w pt.   Exercise: d/w pt.    She is seeing eye clinic about macular changes.  I'll defer.  D/w pt.    PMH and SH reviewed  Meds, vitals, and allergies reviewed.   ROS: Per HPI.  Unless specifically indicated otherwise in HPI, the patient denies:  General: fever. Eyes: acute vision changes ENT: sore throat Cardiovascular: chest pain Respiratory: SOB GI: vomiting GU: dysuria Musculoskeletal: acute back pain Derm: acute rash Neuro: acute motor dysfunction Psych: worsening mood Endocrine: polydipsia Heme: bleeding Allergy: hayfever  GEN: nad, alert and oriented HEENT: ncat NECK: supple w/o LA CV: rrr. PULM: ctab, no inc wob ABD: soft, +bs EXT: no edema SKIN: no acute rash R 2nd finger puffy but not red.   Normal radial and DP pulses.

## 2021-12-12 NOTE — Patient Instructions (Addendum)
Go to the lab on the way out.   If you have mychart we'll likely use that to update you.    Take care.  Glad to see you.  You can call for a bone density test at [facility] at [facility]  [place] [phone]  I put in the referral for rheumatology.   It would be reasonable to try gabapentin for pain.

## 2021-12-13 LAB — PTH, INTACT AND CALCIUM
Calcium: 10.1 mg/dL (ref 8.6–10.4)
PTH: 124 pg/mL — ABNORMAL HIGH (ref 16–77)

## 2021-12-14 NOTE — Assessment & Plan Note (Signed)
Flu previously done Shingles previously done PNA previously done Tetanus 2019 COVID-vaccine previously done Colonoscopy 2022 Breast cancer screening 2022 Bone density test 2021 Advance directive- sons Remo Lipps and Shanon Brow equally designated if patient were incapacitated.  Cognitive function addressed- see scanned forms- and if abnormal then additional documentation follows.

## 2021-12-14 NOTE — Assessment & Plan Note (Signed)
D/w pt about options.  R 2nd finger. No pain now but puffy.  She isn't taking colchicine currently.  D/w pt about seeing rheumatology.  Refer.

## 2021-12-14 NOTE — Assessment & Plan Note (Signed)
Continue metoprolol.  See notes on labs.

## 2021-12-14 NOTE — Assessment & Plan Note (Signed)
Continue pravastatin.  See notes on labs. 

## 2021-12-14 NOTE — Assessment & Plan Note (Signed)
Compliant.  Continue levothyroxine as is.  See notes on labs.

## 2021-12-14 NOTE — Assessment & Plan Note (Signed)
Reasonable to try gabapentin with routine cautions and refer back to physical therapy.

## 2021-12-14 NOTE — Assessment & Plan Note (Signed)
See notes on follow-up labs.  H/o osteopenia.  DXA 2021.  Hyperparathyroidism hx noted.  Follow-up bone density test ordered and she can call about scheduling.

## 2021-12-14 NOTE — Assessment & Plan Note (Signed)
Advance directive- sons Remo Lipps and Shanon Brow equally designated if patient were incapacitated.

## 2021-12-20 ENCOUNTER — Other Ambulatory Visit: Payer: Self-pay | Admitting: Family Medicine

## 2021-12-20 ENCOUNTER — Ambulatory Visit
Admission: RE | Admit: 2021-12-20 | Discharge: 2021-12-20 | Disposition: A | Discharge status: Home / Self Care | Payer: PPO | Source: Ambulatory Visit | Attending: Family Medicine | Admitting: Family Medicine

## 2021-12-20 DIAGNOSIS — E210 Primary hyperparathyroidism: Secondary | ICD-10-CM | POA: Diagnosis not present

## 2021-12-20 DIAGNOSIS — M85852 Other specified disorders of bone density and structure, left thigh: Secondary | ICD-10-CM | POA: Diagnosis not present

## 2021-12-20 DIAGNOSIS — Z780 Asymptomatic menopausal state: Secondary | ICD-10-CM | POA: Diagnosis not present

## 2021-12-21 ENCOUNTER — Encounter: Payer: Self-pay | Admitting: *Deleted

## 2021-12-26 DIAGNOSIS — M6281 Muscle weakness (generalized): Secondary | ICD-10-CM | POA: Diagnosis not present

## 2021-12-26 DIAGNOSIS — M545 Low back pain, unspecified: Secondary | ICD-10-CM | POA: Diagnosis not present

## 2022-01-03 DIAGNOSIS — M545 Low back pain, unspecified: Secondary | ICD-10-CM | POA: Diagnosis not present

## 2022-01-03 DIAGNOSIS — M6281 Muscle weakness (generalized): Secondary | ICD-10-CM | POA: Diagnosis not present

## 2022-01-05 DIAGNOSIS — M545 Low back pain, unspecified: Secondary | ICD-10-CM | POA: Diagnosis not present

## 2022-01-05 DIAGNOSIS — M6281 Muscle weakness (generalized): Secondary | ICD-10-CM | POA: Diagnosis not present

## 2022-01-11 DIAGNOSIS — M545 Low back pain, unspecified: Secondary | ICD-10-CM | POA: Diagnosis not present

## 2022-01-11 DIAGNOSIS — M6281 Muscle weakness (generalized): Secondary | ICD-10-CM | POA: Diagnosis not present

## 2022-01-22 DIAGNOSIS — H353221 Exudative age-related macular degeneration, left eye, with active choroidal neovascularization: Secondary | ICD-10-CM | POA: Diagnosis not present

## 2022-01-23 DIAGNOSIS — E213 Hyperparathyroidism, unspecified: Secondary | ICD-10-CM | POA: Diagnosis not present

## 2022-01-23 DIAGNOSIS — M85852 Other specified disorders of bone density and structure, left thigh: Secondary | ICD-10-CM | POA: Diagnosis not present

## 2022-01-30 ENCOUNTER — Other Ambulatory Visit: Payer: Self-pay | Admitting: Surgery

## 2022-01-30 DIAGNOSIS — E213 Hyperparathyroidism, unspecified: Secondary | ICD-10-CM

## 2022-02-19 DIAGNOSIS — M5416 Radiculopathy, lumbar region: Secondary | ICD-10-CM | POA: Diagnosis not present

## 2022-02-19 DIAGNOSIS — M5136 Other intervertebral disc degeneration, lumbar region: Secondary | ICD-10-CM | POA: Diagnosis not present

## 2022-02-21 ENCOUNTER — Encounter
Admission: RE | Admit: 2022-02-21 | Discharge: 2022-02-21 | Disposition: A | Discharge status: Home / Self Care | Payer: PPO | Source: Ambulatory Visit | Attending: Surgery | Admitting: Surgery

## 2022-02-21 ENCOUNTER — Ambulatory Visit
Admission: RE | Admit: 2022-02-21 | Discharge: 2022-02-21 | Disposition: A | Discharge status: Home / Self Care | Payer: PPO | Source: Ambulatory Visit | Attending: Surgery | Admitting: Surgery

## 2022-02-21 DIAGNOSIS — E213 Hyperparathyroidism, unspecified: Secondary | ICD-10-CM | POA: Insufficient documentation

## 2022-02-21 MED ORDER — TECHNETIUM TC 99M SESTAMIBI GENERIC - CARDIOLITE
25.0000 | Freq: Once | INTRAVENOUS | Status: AC | PRN
  Administered 2022-02-21: 24.37 via INTRAVENOUS

## 2022-02-22 ENCOUNTER — Telehealth: Payer: Self-pay | Admitting: *Deleted

## 2022-02-22 NOTE — Patient Outreach (Signed)
  Care Coordination   02/22/2022 Name: HELIA HAESE MRN: [id number] DOB: 1945/09/09   Care Coordination Outreach Attempts:  An unsuccessful telephone outreach was attempted today to offer the patient information about available care coordination services as a benefit of their health plan.   Follow Up Plan:  Additional outreach attempts will be made to offer the patient care coordination information and services.   Encounter Outcome:  No Answer  Care Coordination Interventions Activated:  No   Care Coordination Interventions:  No, not indicated    Emelia Loron RN, BSN [place] [phone] [email or web address]

## 2022-02-23 ENCOUNTER — Telehealth: Payer: Self-pay | Admitting: *Deleted

## 2022-02-23 NOTE — Patient Outreach (Signed)
  Care Coordination   02/23/2022 Name: Mercedes [name] MRN: [id number] DOB: 1946/01/29   Care Coordination Outreach Attempts:  An unsuccessful telephone outreach was attempted today to offer the patient information about available care coordination services as a benefit of their health plan.   Follow Up Plan:  Additional outreach attempts will be made to offer the patient care coordination information and services.   Encounter Outcome:  No Answer  Care Coordination Interventions Activated:  No   Care Coordination Interventions:  No, not indicated    Emelia Loron RN, BSN [place] [phone] [email or web address]

## 2022-02-27 NOTE — Progress Notes (Signed)
USN without worrisome findings, but no sign of parathyroid adenoma.  Nuclear scan shows possible uptake on the left, but not definitive.  Will get 4D-CT scan as discussed in the office.  Claiborne Billings - please arrange 4D-CT scan of the neck with parathyroid protocol.  tmg  Armandina Gemma, MD [facility] [place] practice Office: [phone]

## 2022-02-27 NOTE — Progress Notes (Signed)
USN without worrisome findings, but no sign of parathyroid adenoma.  Nuclear scan shows possible uptake on the left, but not definitive.  Will get 4D-CT scan as discussed in the office.  Claiborne Billings - please arrange 4D-CT scan of the neck with parathyroid protocol.  tmg  Armandina Gemma, MD [facility] A [place] practice Office: [phone]

## 2022-03-06 DIAGNOSIS — M47816 Spondylosis without myelopathy or radiculopathy, lumbar region: Secondary | ICD-10-CM | POA: Diagnosis not present

## 2022-03-06 DIAGNOSIS — M4316 Spondylolisthesis, lumbar region: Secondary | ICD-10-CM | POA: Diagnosis not present

## 2022-03-06 DIAGNOSIS — M5416 Radiculopathy, lumbar region: Secondary | ICD-10-CM | POA: Diagnosis not present

## 2022-03-06 DIAGNOSIS — M5136 Other intervertebral disc degeneration, lumbar region: Secondary | ICD-10-CM | POA: Diagnosis not present

## 2022-03-06 DIAGNOSIS — I700 Atherosclerosis of aorta: Secondary | ICD-10-CM | POA: Diagnosis not present

## 2022-03-07 DIAGNOSIS — M48062 Spinal stenosis, lumbar region with neurogenic claudication: Secondary | ICD-10-CM | POA: Diagnosis not present

## 2022-03-07 DIAGNOSIS — M5416 Radiculopathy, lumbar region: Secondary | ICD-10-CM | POA: Diagnosis not present

## 2022-03-08 ENCOUNTER — Other Ambulatory Visit: Payer: Self-pay | Admitting: Surgery

## 2022-03-08 DIAGNOSIS — E213 Hyperparathyroidism, unspecified: Secondary | ICD-10-CM

## 2022-03-13 ENCOUNTER — Telehealth: Payer: Self-pay

## 2022-03-13 NOTE — Telephone Encounter (Signed)
Mercedes [name] is a 76 y.o. female called in complains of bleeding since she started the [place]. Pt is concerned taking Tramadol and Gabapentin will caused vaginal bleeding. Pt saw offered an appointment for 03/14/2022 pt declined. Pt wanted to know if she could stop the medication until her next appt. I told her to stop the medication until her next appt. Pt agreed and will f/u on October.

## 2022-03-19 DIAGNOSIS — H353221 Exudative age-related macular degeneration, left eye, with active choroidal neovascularization: Secondary | ICD-10-CM | POA: Diagnosis not present

## 2022-04-02 ENCOUNTER — Other Ambulatory Visit: Payer: Self-pay | Admitting: Family Medicine

## 2022-04-02 ENCOUNTER — Other Ambulatory Visit (HOSPITAL_COMMUNITY): Payer: Self-pay | Admitting: Family Medicine

## 2022-04-02 DIAGNOSIS — M4807 Spinal stenosis, lumbosacral region: Secondary | ICD-10-CM | POA: Diagnosis not present

## 2022-04-02 DIAGNOSIS — M5416 Radiculopathy, lumbar region: Secondary | ICD-10-CM | POA: Diagnosis not present

## 2022-04-02 DIAGNOSIS — M5136 Other intervertebral disc degeneration, lumbar region: Secondary | ICD-10-CM | POA: Diagnosis not present

## 2022-04-03 ENCOUNTER — Ambulatory Visit
Admission: RE | Admit: 2022-04-03 | Discharge: 2022-04-03 | Disposition: A | Discharge status: Home / Self Care | Payer: PPO | Source: Ambulatory Visit | Attending: Family Medicine | Admitting: Family Medicine

## 2022-04-03 DIAGNOSIS — M5416 Radiculopathy, lumbar region: Secondary | ICD-10-CM

## 2022-04-05 ENCOUNTER — Ambulatory Visit
Admission: RE | Admit: 2022-04-05 | Discharge: 2022-04-05 | Disposition: A | Discharge status: Home / Self Care | Payer: PPO | Source: Ambulatory Visit | Attending: Family Medicine | Admitting: Family Medicine

## 2022-04-05 DIAGNOSIS — M4716 Other spondylosis with myelopathy, lumbar region: Secondary | ICD-10-CM | POA: Diagnosis not present

## 2022-04-05 DIAGNOSIS — M5416 Radiculopathy, lumbar region: Secondary | ICD-10-CM | POA: Insufficient documentation

## 2022-04-05 DIAGNOSIS — M5106 Intervertebral disc disorders with myelopathy, lumbar region: Secondary | ICD-10-CM | POA: Diagnosis not present

## 2022-04-09 ENCOUNTER — Ambulatory Visit: Payer: PPO | Admitting: Obstetrics and Gynecology

## 2022-04-12 ENCOUNTER — Ambulatory Visit (INDEPENDENT_AMBULATORY_CARE_PROVIDER_SITE_OTHER): Payer: PPO

## 2022-04-12 DIAGNOSIS — Z23 Encounter for immunization: Secondary | ICD-10-CM | POA: Diagnosis not present

## 2022-04-16 ENCOUNTER — Other Ambulatory Visit: Payer: PPO

## 2022-04-30 DIAGNOSIS — H353221 Exudative age-related macular degeneration, left eye, with active choroidal neovascularization: Secondary | ICD-10-CM | POA: Diagnosis not present

## 2022-05-01 DIAGNOSIS — M48062 Spinal stenosis, lumbar region with neurogenic claudication: Secondary | ICD-10-CM | POA: Diagnosis not present

## 2022-05-01 DIAGNOSIS — Z6834 Body mass index (BMI) 34.0-34.9, adult: Secondary | ICD-10-CM | POA: Diagnosis not present

## 2022-05-01 DIAGNOSIS — M4316 Spondylolisthesis, lumbar region: Secondary | ICD-10-CM | POA: Diagnosis not present

## 2022-05-08 ENCOUNTER — Other Ambulatory Visit: Payer: Self-pay | Admitting: Neurosurgery

## 2022-05-09 ENCOUNTER — Encounter: Payer: Self-pay | Admitting: Obstetrics and Gynecology

## 2022-05-09 ENCOUNTER — Ambulatory Visit (INDEPENDENT_AMBULATORY_CARE_PROVIDER_SITE_OTHER): Payer: PPO | Admitting: Obstetrics and Gynecology

## 2022-05-09 DIAGNOSIS — N952 Postmenopausal atrophic vaginitis: Secondary | ICD-10-CM

## 2022-05-09 NOTE — Progress Notes (Signed)
[facility] Return Visit  SUBJECTIVE  History of Present Illness: Mercedes [name] is a 76 y.o. female seen in follow-up for recurrent UTI. Plan at last visit was to start vaginal estrogen.   She started with estrogen cream and this caused more burning. Then ordered yuvafem tablets but this still caused irritation. Has been using [place] and that has been working well overall. Has a slight burning sensation still but not as bad as before. Has cut back on the tea somewhat but still drinks it daily.   She has not had any urinary tract infections. Still on cranberry supplements.   Has surgery Nov 20 for her back.    Past Medical History: Patient  has a past medical history of Allergy, Cataract, Diverticulosis of colon, Fatty liver, GERD (gastroesophageal reflux disease), Gout, Headache(784.0), HLD (hyperlipidemia), HTN (hypertension), Hypothyroidism, Macular degeneration, Osteoarthritis, Rapid heartbeat, and Tobacco abuse.   Past Surgical History: She  has a past surgical history that includes Sigmoidoscopy; Colonoscopy; Cholecystectomy (N/A, 03/17/2014); and Cataract extraction.   Medications: She has a current medication list which includes the following prescription(s): cholecalciferol, colchicine, vabysmo, levothyroxine, levothyroxine, metoprolol, multivitamin with minerals, preservision areds 2, naproxen sodium, omega 3, oxycodone-acetaminophen, phenazopyridine, potassium, pravastatin, psyllium, and zinc gluconate.   Allergies: Patient is allergic to ace inhibitors, clarithromycin, fenofibrate, lipitor [atorvastatin], niacin, penicillins, sulfasalazine, and tetracycline hcl.   Social History: Patient  reports that she quit smoking about 11 years ago. Her smoking use included cigarettes. She has a 3.75 pack-year smoking history. She has never used smokeless tobacco. She reports current alcohol use. She reports that she does not use drugs.      OBJECTIVE     Physical  Exam: There were no vitals filed for this visit. Gen: No apparent distress, A&O x 3.  Detailed Urogynecologic Evaluation:  Deferred.    ASSESSMENT AND PLAN    Mercedes [name] is a 76 y.o. with:  1. Vaginal atrophy    - Can continue to use [place] as needed for vaginal irritaiton.  - Also recommended coconut oil or vitamin E cream  - Has not had any recent UTIs but we discussed that if they return, will consider using estring for vaginal estrogen, as this is less likely to cause irritation.   Return as needed  Jaquita Folds, MD  Time spent: I spent 17 minutes dedicated to the care of this patient on the date of this encounter to include pre-visit review of records, face-to-face time with the patient and post visit documentation.

## 2022-05-09 NOTE — Patient Instructions (Addendum)
Vulvovaginal moisturizer Options: Vitamin E oil (pump or capsule) or cream (Gene's Vit E Cream) Coconut oil Silicone-based lubricant for use during intercourse ("wet platinum" is a brand available at most drugstores) [employer]  Consider the ingredients of the product - the fewer the ingredients the better!  Directions for Use: Clean and dry your hands Gently dab the vulvar/vaginal area dry as needed Apply a "pea-sized" amount of the moisturizer onto your fingertip Using you other hand, open the labia  Apply the moisturizer to the vulvar/vaginal tissues Wear loose fitting underwear/clothing if possible following application Use moisturize up to 3 times daily as desired.   Continue with cranberry tablets daily.

## 2022-05-15 NOTE — Progress Notes (Signed)
Surgical Instructions    Your procedure is scheduled on Monday November 20th.  Report to [facility] Main Entrance "A" at 10 A.M., then check in with the Admitting office.  Call this number if you have problems the morning of surgery:  [phone]   If you have any questions prior to your surgery date call [phone]: Open Monday-Friday 8am-4pm If you experience any cold or flu symptoms such as cough, fever, chills, shortness of breath, etc. between now and your scheduled surgery, please notify us at the above number     Remember:  Do not eat or drink after midnight the night before your surgery     Take these medicines the morning of surgery with A SIP OF WATER: levothyroxine (SYNTHROID)  metoprolol (TOPROL-XL) 200 MG 24 hr tablet  pravastatin (PRAVACHOL) 40 MG tablet   IF NEEDED  HYDROcodone-acetaminophen (NORCO/VICODIN) 5-325 MG tablet      As of today, STOP taking any Aspirin (unless otherwise instructed by your surgeon) Aleve, Naproxen, Ibuprofen, Motrin, Advil, Goody's, BC's, all herbal medications, fish oil, and all vitamins.           Do not wear jewelry or makeup. Do not wear lotions, powders, perfumes or deodorant. Do not shave 48 hours prior to surgery.   Do not bring valuables to the hospital. Do not wear nail polish, gel polish, artificial nails, or any other type of covering on natural nails (fingers and toes) If you have artificial nails or gel coating that need to be removed by a nail salon, please have this removed prior to surgery. Artificial nails or gel coating may interfere with anesthesia's ability to adequately monitor your vital signs.  [facility] is not responsible for any belongings or valuables.    Do NOT Smoke (Tobacco/Vaping)  24 hours prior to your procedure  If you use a CPAP at night, you may bring your mask for your overnight stay.   Contacts, glasses, hearing aids, dentures or partials may not be worn into surgery, please bring cases for  these belongings   For patients admitted to the hospital, discharge time will be determined by your treatment team.   Patients discharged the day of surgery will not be allowed to drive home, and someone needs to stay with them for 24 hours.   SURGICAL WAITING ROOM VISITATION Patients having surgery or a procedure may have no more than 2 support people in the waiting area - these visitors may rotate.   Children under the age of 51 must have an adult with them who is not the patient. If the patient needs to stay at the hospital during part of their recovery, the visitor guidelines for inpatient rooms apply. Pre-op nurse will coordinate an appropriate time for 1 support person to accompany patient in pre-op.  This support person may not rotate.   Please refer to [email or web address] for the visitor guidelines for Inpatients (after your surgery is over and you are in a regular room).    Special instructions:    Oral Hygiene is also important to reduce your risk of infection.  Remember - BRUSH YOUR TEETH THE MORNING OF SURGERY WITH YOUR REGULAR TOOTHPASTE   [facility]- Preparing For Surgery  Before surgery, you can play an important role. Because skin is not sterile, your skin needs to be as free of germs as possible. You can reduce the number of germs on your skin by washing with CHG (chlorahexidine gluconate) Soap before surgery.  CHG is an antiseptic cleaner which  kills germs and bonds with the skin to continue killing germs even after washing.     Please do not use if you have an allergy to CHG or antibacterial soaps. If your skin becomes reddened/irritated stop using the CHG.  Do not shave (including legs and underarms) for at least 48 hours prior to first CHG shower. It is OK to shave your face.  Please follow these instructions carefully.     Shower the NIGHT BEFORE SURGERY and the MORNING OF SURGERY with CHG Soap.   If you chose  to wash your hair, wash your hair first as usual with your normal shampoo. After you shampoo, rinse your hair and body thoroughly to remove the shampoo.  Then [employer] and genitals (private parts) with your normal soap and rinse thoroughly to remove soap.  After that Use CHG Soap as you would any other liquid soap. You can apply CHG directly to the skin and wash gently with a scrungie or a clean washcloth.   Apply the CHG Soap to your body ONLY FROM THE NECK DOWN.  Do not use on open wounds or open sores. Avoid contact with your eyes, ears, mouth and genitals (private parts). Wash Face and genitals (private parts)  with your normal soap.   Wash thoroughly, paying special attention to the area where your surgery will be performed.  Thoroughly rinse your body with warm water from the neck down.  DO NOT shower/wash with your normal soap after using and rinsing off the CHG Soap.  Pat yourself dry with a CLEAN TOWEL.  Wear CLEAN PAJAMAS to bed the night before surgery  Place CLEAN SHEETS on your bed the night before your surgery  DO NOT SLEEP WITH PETS.   Day of Surgery:  Take a shower with CHG soap. Wear Clean/Comfortable clothing the morning of surgery Do not apply any deodorants/lotions.   Remember to brush your teeth WITH YOUR REGULAR TOOTHPASTE.    If you received a COVID test during your pre-op visit, it is requested that you wear a mask when out in public, stay away from anyone that may not be feeling well, and notify your surgeon if you develop symptoms. If you have been in contact with anyone that has tested positive in the last 10 days, please notify your surgeon.    Please read over the following fact sheets that you were given.

## 2022-05-16 ENCOUNTER — Other Ambulatory Visit: Payer: Self-pay

## 2022-05-16 ENCOUNTER — Encounter (HOSPITAL_COMMUNITY)
Admission: RE | Admit: 2022-05-16 | Discharge: 2022-05-16 | Disposition: A | Discharge status: Home / Self Care | Payer: PPO | Source: Ambulatory Visit | Attending: Neurosurgery | Admitting: Neurosurgery

## 2022-05-16 ENCOUNTER — Encounter (HOSPITAL_COMMUNITY): Payer: Self-pay

## 2022-05-16 VITALS — BP 132/59 | HR 73 | Temp 97.9°F | Resp 18 | Ht 64.0 in | Wt 206.4 lb

## 2022-05-16 DIAGNOSIS — I10 Essential (primary) hypertension: Secondary | ICD-10-CM | POA: Diagnosis not present

## 2022-05-16 DIAGNOSIS — Z01818 Encounter for other preprocedural examination: Secondary | ICD-10-CM | POA: Diagnosis not present

## 2022-05-16 LAB — BASIC METABOLIC PANEL
Anion gap: 10 (ref 5–15)
BUN: 18 mg/dL (ref 8–23)
CO2: 26 mmol/L (ref 22–32)
Calcium: 10.1 mg/dL (ref 8.9–10.3)
Chloride: 105 mmol/L (ref 98–111)
Creatinine, Ser: 0.61 mg/dL (ref 0.44–1.00)
GFR, Estimated: >60 mL/min (ref >60)
Glucose, Bld: 131 mg/dL — ABNORMAL HIGH (ref 70–99)
Potassium: 4.2 mmol/L (ref 3.5–5.1)
Sodium: 141 mmol/L (ref 135–145)

## 2022-05-16 LAB — SURGICAL PCR SCREEN
MRSA, PCR: NEGATIVE
Staphylococcus aureus: NEGATIVE

## 2022-05-16 LAB — CBC
HCT: 46.2 % — ABNORMAL HIGH (ref 36.0–46.0)
Hemoglobin: 15.5 g/dL — ABNORMAL HIGH (ref 12.0–15.0)
MCH: 33.8 pg (ref 26.0–34.0)
MCHC: 33.5 g/dL (ref 30.0–36.0)
MCV: 100.7 fL — ABNORMAL HIGH (ref 80.0–100.0)
Platelets: 202 10*3/uL (ref 150–400)
RBC: 4.59 MIL/uL (ref 3.87–5.11)
RDW: 12.3 % (ref 11.5–15.5)
WBC: 6.6 10*3/uL (ref 4.0–10.5)
nRBC: 0 % (ref 0.0–0.2)

## 2022-05-16 LAB — TYPE AND SCREEN
ABO/RH(D): A NEG
Antibody Screen: NEGATIVE

## 2022-05-16 NOTE — Progress Notes (Signed)
PCP - Dr.Graham Damita Dunnings Cardiologist -  pt denies  PPM/ICD -pt denies Device Orders - n/a Rep Notified - n/a  Chest x-ray - n/a EKG - 05/16/22 Stress Test - pt denies ECHO - pt denies Cardiac Cath - pt denies  Sleep Study - pt denies CPAP - n/a  DM=pt denies Blood Thinner Instructions:pt denies Aspirin Instructions:pt denies  NPO COVID TEST- n/a   Anesthesia review: NO  Patient denies shortness of breath, fever, cough and chest pain at PAT appointment   All instructions explained to the patient, with a verbal understanding of the material. Patient agrees to go over the instructions while at home for a better understanding. Patient also instructed to self quarantine after being tested for COVID-19. The opportunity to ask questions was provided.

## 2022-05-17 ENCOUNTER — Other Ambulatory Visit: Payer: Self-pay | Admitting: Neurosurgery

## 2022-05-21 ENCOUNTER — Other Ambulatory Visit: Payer: Self-pay

## 2022-05-21 ENCOUNTER — Ambulatory Visit (HOSPITAL_COMMUNITY): Payer: PPO

## 2022-05-21 ENCOUNTER — Encounter (HOSPITAL_COMMUNITY): Payer: Self-pay | Admitting: Neurosurgery

## 2022-05-21 ENCOUNTER — Ambulatory Visit (HOSPITAL_COMMUNITY)
Admission: RE | Disposition: A | Discharge status: Home / Self Care | Payer: Self-pay | Source: Home / Self Care | Attending: Neurosurgery

## 2022-05-21 ENCOUNTER — Ambulatory Visit (HOSPITAL_BASED_OUTPATIENT_CLINIC_OR_DEPARTMENT_OTHER): Payer: PPO | Admitting: Anesthesiology

## 2022-05-21 ENCOUNTER — Ambulatory Visit (HOSPITAL_COMMUNITY)
Admission: RE | Admit: 2022-05-21 | Discharge: 2022-05-22 | Disposition: A | Discharge status: Home / Self Care | Payer: PPO | Attending: Neurosurgery | Admitting: Neurosurgery

## 2022-05-21 ENCOUNTER — Ambulatory Visit (HOSPITAL_COMMUNITY): Payer: PPO | Admitting: Anesthesiology

## 2022-05-21 DIAGNOSIS — E039 Hypothyroidism, unspecified: Secondary | ICD-10-CM | POA: Diagnosis not present

## 2022-05-21 DIAGNOSIS — K219 Gastro-esophageal reflux disease without esophagitis: Secondary | ICD-10-CM | POA: Insufficient documentation

## 2022-05-21 DIAGNOSIS — Z79899 Other long term (current) drug therapy: Secondary | ICD-10-CM | POA: Insufficient documentation

## 2022-05-21 DIAGNOSIS — M48062 Spinal stenosis, lumbar region with neurogenic claudication: Secondary | ICD-10-CM

## 2022-05-21 DIAGNOSIS — Z01818 Encounter for other preprocedural examination: Secondary | ICD-10-CM | POA: Diagnosis not present

## 2022-05-21 DIAGNOSIS — I10 Essential (primary) hypertension: Secondary | ICD-10-CM | POA: Insufficient documentation

## 2022-05-21 DIAGNOSIS — M4316 Spondylolisthesis, lumbar region: Secondary | ICD-10-CM | POA: Diagnosis present

## 2022-05-21 DIAGNOSIS — Z87891 Personal history of nicotine dependence: Secondary | ICD-10-CM | POA: Diagnosis not present

## 2022-05-21 DIAGNOSIS — M5116 Intervertebral disc disorders with radiculopathy, lumbar region: Secondary | ICD-10-CM | POA: Insufficient documentation

## 2022-05-21 DIAGNOSIS — Z79890 Hormone replacement therapy (postmenopausal): Secondary | ICD-10-CM | POA: Diagnosis not present

## 2022-05-21 DIAGNOSIS — M1990 Unspecified osteoarthritis, unspecified site: Secondary | ICD-10-CM | POA: Insufficient documentation

## 2022-05-21 DIAGNOSIS — M4326 Fusion of spine, lumbar region: Secondary | ICD-10-CM | POA: Diagnosis not present

## 2022-05-21 LAB — ABO/RH: ABO/RH(D): A NEG

## 2022-05-21 SURGERY — POSTERIOR LUMBAR FUSION 1 LEVEL
Anesthesia: General

## 2022-05-21 MED ORDER — PSYLLIUM 95 % PO PACK
1.0000 | PACK | Freq: Every day | ORAL | Status: DC
  Administered 2022-05-21: 1 via ORAL
  Filled 2022-05-21 (×2): qty 1

## 2022-05-21 MED ORDER — OXYCODONE HCL 5 MG PO TABS: 10.0000 mg | ORAL_TABLET | ORAL | Status: DC | PRN

## 2022-05-21 MED ORDER — ACETAMINOPHEN 500 MG PO TABS
1000.0000 mg | ORAL_TABLET | Freq: Once | ORAL | Status: AC
  Administered 2022-05-21: 1000 mg via ORAL
  Filled 2022-05-21: qty 2

## 2022-05-21 MED ORDER — VANCOMYCIN HCL IN DEXTROSE 1-5 GM/200ML-% IV SOLN
1000.0000 mg | INTRAVENOUS | Status: AC
  Administered 2022-05-21: 1000 mg via INTRAVENOUS
  Filled 2022-05-21: qty 200

## 2022-05-21 MED ORDER — BUPIVACAINE-EPINEPHRINE (PF) 0.5% -1:200000 IJ SOLN
INTRAMUSCULAR | Status: DC | PRN
  Administered 2022-05-21: 10 mL via PERINEURAL

## 2022-05-21 MED ORDER — CYCLOBENZAPRINE HCL 10 MG PO TABS
10.0000 mg | ORAL_TABLET | Freq: Three times a day (TID) | ORAL | Status: DC | PRN
  Administered 2022-05-21: 10 mg via ORAL
  Filled 2022-05-21: qty 1

## 2022-05-21 MED ORDER — THROMBIN 5000 UNITS EX SOLR
CUTANEOUS | Status: AC
  Filled 2022-05-21: qty 5000

## 2022-05-21 MED ORDER — ROCURONIUM BROMIDE 10 MG/ML (PF) SYRINGE
PREFILLED_SYRINGE | INTRAVENOUS | Status: AC
  Filled 2022-05-21: qty 10

## 2022-05-21 MED ORDER — HYDROCODONE-ACETAMINOPHEN 5-325 MG PO TABS: 1.0000 | ORAL_TABLET | ORAL | Status: DC | PRN

## 2022-05-21 MED ORDER — ORAL CARE MOUTH RINSE: 15.0000 mL | Freq: Once | OROMUCOSAL | Status: DC

## 2022-05-21 MED ORDER — SUGAMMADEX SODIUM 200 MG/2ML IV SOLN
INTRAVENOUS | Status: DC | PRN
  Administered 2022-05-21: 200 mg via INTRAVENOUS

## 2022-05-21 MED ORDER — CHLORHEXIDINE GLUCONATE CLOTH 2 % EX PADS: 6.0000 | MEDICATED_PAD | Freq: Once | CUTANEOUS | Status: DC

## 2022-05-21 MED ORDER — BACITRACIN ZINC 500 UNIT/GM EX OINT
TOPICAL_OINTMENT | CUTANEOUS | Status: DC | PRN
  Administered 2022-05-21: 1 via TOPICAL

## 2022-05-21 MED ORDER — LACTATED RINGERS IV SOLN: INTRAVENOUS | Status: DC

## 2022-05-21 MED ORDER — ORAL CARE MOUTH RINSE: 15.0000 mL | Freq: Once | OROMUCOSAL | Status: AC

## 2022-05-21 MED ORDER — MORPHINE SULFATE (PF) 4 MG/ML IV SOLN: 4.0000 mg | INTRAVENOUS | Status: DC | PRN

## 2022-05-21 MED ORDER — ONDANSETRON HCL 4 MG/2ML IJ SOLN
INTRAMUSCULAR | Status: AC
  Filled 2022-05-21: qty 2

## 2022-05-21 MED ORDER — CELECOXIB 200 MG PO CAPS
200.0000 mg | ORAL_CAPSULE | Freq: Once | ORAL | Status: AC
  Filled 2022-05-21: qty 1

## 2022-05-21 MED ORDER — OXYCODONE HCL 5 MG PO TABS
5.0000 mg | ORAL_TABLET | ORAL | Status: DC | PRN
  Filled 2022-05-21: qty 1

## 2022-05-21 MED ORDER — THROMBIN 5000 UNITS EX SOLR
OROMUCOSAL | Status: DC | PRN
  Administered 2022-05-21 (×2): 5 mL via TOPICAL

## 2022-05-21 MED ORDER — FENTANYL CITRATE (PF) 100 MCG/2ML IJ SOLN
25.0000 ug | INTRAMUSCULAR | Status: DC | PRN
  Administered 2022-05-21: 50 ug via INTRAVENOUS

## 2022-05-21 MED ORDER — SODIUM CHLORIDE 0.9% FLUSH
3.0000 mL | Freq: Two times a day (BID) | INTRAVENOUS | Status: DC
  Administered 2022-05-21: 3 mL via INTRAVENOUS

## 2022-05-21 MED ORDER — FENTANYL CITRATE (PF) 250 MCG/5ML IJ SOLN
INTRAMUSCULAR | Status: AC
  Filled 2022-05-21: qty 5

## 2022-05-21 MED ORDER — PHENYLEPHRINE HCL-NACL 20-0.9 MG/250ML-% IV SOLN
INTRAVENOUS | Status: DC | PRN
  Administered 2022-05-21: 25 ug/min via INTRAVENOUS

## 2022-05-21 MED ORDER — PHENYLEPHRINE 80 MCG/ML (10ML) SYRINGE FOR IV PUSH (FOR BLOOD PRESSURE SUPPORT)
PREFILLED_SYRINGE | INTRAVENOUS | Status: DC | PRN
  Administered 2022-05-21: 40 ug via INTRAVENOUS
  Administered 2022-05-21 (×2): 80 ug via INTRAVENOUS

## 2022-05-21 MED ORDER — BUPIVACAINE LIPOSOME 1.3 % IJ SUSP
INTRAMUSCULAR | Status: DC | PRN
  Administered 2022-05-21: 20 mL

## 2022-05-21 MED ORDER — ZOLPIDEM TARTRATE 5 MG PO TABS: 5.0000 mg | ORAL_TABLET | Freq: Every evening | ORAL | Status: DC | PRN

## 2022-05-21 MED ORDER — ONDANSETRON HCL 4 MG PO TABS: 4.0000 mg | ORAL_TABLET | Freq: Four times a day (QID) | ORAL | Status: DC | PRN

## 2022-05-21 MED ORDER — BUPIVACAINE LIPOSOME 1.3 % IJ SUSP
INTRAMUSCULAR | Status: AC
  Filled 2022-05-21: qty 20

## 2022-05-21 MED ORDER — LIDOCAINE 2% (20 MG/ML) 5 ML SYRINGE
INTRAMUSCULAR | Status: AC
  Filled 2022-05-21: qty 5

## 2022-05-21 MED ORDER — FENTANYL CITRATE (PF) 100 MCG/2ML IJ SOLN
INTRAMUSCULAR | Status: AC
  Filled 2022-05-21: qty 2

## 2022-05-21 MED ORDER — METOPROLOL SUCCINATE ER 100 MG PO TB24
200.0000 mg | ORAL_TABLET | Freq: Every morning | ORAL | Status: DC
  Administered 2022-05-22: 200 mg via ORAL
  Filled 2022-05-21: qty 2

## 2022-05-21 MED ORDER — 0.9 % SODIUM CHLORIDE (POUR BTL) OPTIME
TOPICAL | Status: DC | PRN
  Administered 2022-05-21: 1000 mL

## 2022-05-21 MED ORDER — ROCURONIUM BROMIDE 10 MG/ML (PF) SYRINGE
PREFILLED_SYRINGE | INTRAVENOUS | Status: DC | PRN
  Administered 2022-05-21: 30 mg via INTRAVENOUS
  Administered 2022-05-21: 50 mg via INTRAVENOUS
  Administered 2022-05-21 (×2): 10 mg via INTRAVENOUS

## 2022-05-21 MED ORDER — BUPIVACAINE-EPINEPHRINE (PF) 0.5% -1:200000 IJ SOLN
INTRAMUSCULAR | Status: AC
  Filled 2022-05-21: qty 30

## 2022-05-21 MED ORDER — ONDANSETRON HCL 4 MG/2ML IJ SOLN: 4.0000 mg | Freq: Four times a day (QID) | INTRAMUSCULAR | Status: DC | PRN

## 2022-05-21 MED ORDER — DOCUSATE SODIUM 100 MG PO CAPS
100.0000 mg | ORAL_CAPSULE | Freq: Two times a day (BID) | ORAL | Status: DC
  Administered 2022-05-21: 100 mg via ORAL
  Filled 2022-05-21: qty 1

## 2022-05-21 MED ORDER — SODIUM CHLORIDE 0.9% FLUSH: 3.0000 mL | INTRAVENOUS | Status: DC | PRN

## 2022-05-21 MED ORDER — BACITRACIN ZINC 500 UNIT/GM EX OINT
TOPICAL_OINTMENT | CUTANEOUS | Status: AC
  Filled 2022-05-21: qty 28.35

## 2022-05-21 MED ORDER — SODIUM CHLORIDE 0.9 % IV SOLN: 250.0000 mL | INTRAVENOUS | Status: DC

## 2022-05-21 MED ORDER — LEVOTHYROXINE SODIUM 25 MCG PO TABS
25.0000 ug | ORAL_TABLET | ORAL | Status: DC
  Administered 2022-05-22: 25 ug via ORAL
  Filled 2022-05-21: qty 1

## 2022-05-21 MED ORDER — MENTHOL 3 MG MT LOZG: 1.0000 | LOZENGE | OROMUCOSAL | Status: DC | PRN

## 2022-05-21 MED ORDER — PHENAZOPYRIDINE HCL 200 MG PO TABS: 200.0000 mg | ORAL_TABLET | Freq: Three times a day (TID) | ORAL | Status: DC

## 2022-05-21 MED ORDER — ACETAMINOPHEN 325 MG PO TABS
650.0000 mg | ORAL_TABLET | ORAL | Status: DC | PRN
  Filled 2022-05-21: qty 2

## 2022-05-21 MED ORDER — PHENOL 1.4 % MT LIQD: 1.0000 | OROMUCOSAL | Status: DC | PRN

## 2022-05-21 MED ORDER — METOPROLOL SUCCINATE ER 100 MG PO TB24: 200.0000 mg | ORAL_TABLET | Freq: Every morning | ORAL | Status: DC

## 2022-05-21 MED ORDER — LEVOTHYROXINE SODIUM 100 MCG PO TABS
200.0000 ug | ORAL_TABLET | Freq: Every day | ORAL | Status: DC
  Administered 2022-05-22: 200 ug via ORAL
  Filled 2022-05-21: qty 2

## 2022-05-21 MED ORDER — CELECOXIB 200 MG PO CAPS
ORAL_CAPSULE | ORAL | Status: AC
  Administered 2022-05-21: 200 mg via ORAL
  Filled 2022-05-21: qty 1

## 2022-05-21 MED ORDER — DEXAMETHASONE SODIUM PHOSPHATE 10 MG/ML IJ SOLN
INTRAMUSCULAR | Status: DC | PRN
  Administered 2022-05-21: 10 mg via INTRAVENOUS

## 2022-05-21 MED ORDER — ACETAMINOPHEN 500 MG PO TABS
1000.0000 mg | ORAL_TABLET | Freq: Four times a day (QID) | ORAL | Status: DC
  Administered 2022-05-21 – 2022-05-22 (×3): 1000 mg via ORAL
  Filled 2022-05-21 (×3): qty 2

## 2022-05-21 MED ORDER — LIDOCAINE 2% (20 MG/ML) 5 ML SYRINGE
INTRAMUSCULAR | Status: DC | PRN
  Administered 2022-05-21: 60 mg via INTRAVENOUS

## 2022-05-21 MED ORDER — ACETAMINOPHEN 650 MG RE SUPP: 650.0000 mg | RECTAL | Status: DC | PRN

## 2022-05-21 MED ORDER — PRAVASTATIN SODIUM 40 MG PO TABS
40.0000 mg | ORAL_TABLET | Freq: Every day | ORAL | Status: DC
  Administered 2022-05-21: 40 mg via ORAL
  Filled 2022-05-21: qty 1

## 2022-05-21 MED ORDER — CEFAZOLIN SODIUM-DEXTROSE 2-4 GM/100ML-% IV SOLN
2.0000 g | Freq: Three times a day (TID) | INTRAVENOUS | Status: AC
  Administered 2022-05-21 – 2022-05-22 (×2): 2 g via INTRAVENOUS
  Filled 2022-05-21 (×2): qty 100

## 2022-05-21 MED ORDER — CHLORHEXIDINE GLUCONATE 0.12 % MT SOLN
15.0000 mL | Freq: Once | OROMUCOSAL | Status: DC
  Filled 2022-05-21: qty 15

## 2022-05-21 MED ORDER — BISACODYL 10 MG RE SUPP: 10.0000 mg | Freq: Every day | RECTAL | Status: DC | PRN

## 2022-05-21 MED ORDER — DEXAMETHASONE SODIUM PHOSPHATE 10 MG/ML IJ SOLN
INTRAMUSCULAR | Status: AC
  Filled 2022-05-21: qty 1

## 2022-05-21 MED ORDER — COLCHICINE 0.6 MG PO TABS
0.6000 mg | ORAL_TABLET | Freq: Every day | ORAL | Status: DC
  Filled 2022-05-21 (×2): qty 1

## 2022-05-21 MED ORDER — FENTANYL CITRATE (PF) 250 MCG/5ML IJ SOLN
INTRAMUSCULAR | Status: DC | PRN
  Administered 2022-05-21 (×5): 50 ug via INTRAVENOUS

## 2022-05-21 MED ORDER — PROPOFOL 10 MG/ML IV BOLUS
INTRAVENOUS | Status: DC | PRN
  Administered 2022-05-21: 140 mg via INTRAVENOUS

## 2022-05-21 MED ORDER — CHLORHEXIDINE GLUCONATE 0.12 % MT SOLN
15.0000 mL | Freq: Once | OROMUCOSAL | Status: AC
  Administered 2022-05-21: 15 mL via OROMUCOSAL
  Filled 2022-05-21: qty 15

## 2022-05-21 SURGICAL SUPPLY — 67 items
ADH SKN CLS APL DERMABOND .7 (GAUZE/BANDAGES/DRESSINGS) ×1
APL SKNCLS STERI-STRIP NONHPOA (GAUZE/BANDAGES/DRESSINGS) ×1
BAG COUNTER SPONGE SURGICOUNT (BAG) ×1 IMPLANT
BAG SPNG CNTER NS LX DISP (BAG) ×2
BASKET BONE COLLECTION (BASKET) ×1 IMPLANT
BENZOIN TINCTURE PRP APPL 2/3 (GAUZE/BANDAGES/DRESSINGS) ×1 IMPLANT
BLADE CLIPPER SURG (BLADE) IMPLANT
BUR MATCHSTICK NEURO 3.0 LAGG (BURR) ×1 IMPLANT
BUR PRECISION FLUTE 6.0 (BURR) ×1 IMPLANT
CAGE ALTERA 10X31X9-13 15D (Cage) IMPLANT
CANISTER SUCT 3000ML PPV (MISCELLANEOUS) ×1 IMPLANT
CAP LOCK DLX THRD (Cap) IMPLANT
CNTNR URN SCR LID CUP LEK RST (MISCELLANEOUS) ×1 IMPLANT
CONT SPEC 4OZ STRL OR WHT (MISCELLANEOUS) ×1
COVER BACK TABLE 60X90IN (DRAPES) ×1 IMPLANT
DERMABOND ADVANCED .7 DNX12 (GAUZE/BANDAGES/DRESSINGS) IMPLANT
DRAPE C-ARM 42X72 X-RAY (DRAPES) ×2 IMPLANT
DRAPE HALF SHEET 40X57 (DRAPES) ×1 IMPLANT
DRAPE LAPAROTOMY 100X72X124 (DRAPES) ×1 IMPLANT
DRAPE SURG 17X23 STRL (DRAPES) ×4 IMPLANT
DRSG OPSITE POSTOP 4X6 (GAUZE/BANDAGES/DRESSINGS) ×1 IMPLANT
ELECT BLADE 4.0 EZ CLEAN MEGAD (MISCELLANEOUS) ×1
ELECT REM PT RETURN 9FT ADLT (ELECTROSURGICAL) ×1
ELECTRODE BLDE 4.0 EZ CLN MEGD (MISCELLANEOUS) ×1 IMPLANT
ELECTRODE REM PT RTRN 9FT ADLT (ELECTROSURGICAL) ×1 IMPLANT
EVACUATOR 1/8 PVC DRAIN (DRAIN) IMPLANT
GAUZE 4X4 16PLY ~~LOC~~+RFID DBL (SPONGE) ×1 IMPLANT
GLOVE BIO SURGEON STRL SZ 6 (GLOVE) ×1 IMPLANT
GLOVE BIO SURGEON STRL SZ8 (GLOVE) ×2 IMPLANT
GLOVE BIO SURGEON STRL SZ8.5 (GLOVE) ×2 IMPLANT
GLOVE BIOGEL PI IND STRL 6.5 (GLOVE) ×1 IMPLANT
GLOVE EXAM NITRILE XL STR (GLOVE) IMPLANT
GLOVE SURG SS PI 6.5 STRL IVOR (GLOVE) IMPLANT
GLOVE SURG SS PI 7.5 STRL IVOR (GLOVE) IMPLANT
GOWN STRL REUS W/ TWL LRG LVL3 (GOWN DISPOSABLE) ×1 IMPLANT
GOWN STRL REUS W/ TWL XL LVL3 (GOWN DISPOSABLE) ×2 IMPLANT
GOWN STRL REUS W/TWL 2XL LVL3 (GOWN DISPOSABLE) IMPLANT
GOWN STRL REUS W/TWL LRG LVL3 (GOWN DISPOSABLE) ×3
GOWN STRL REUS W/TWL XL LVL3 (GOWN DISPOSABLE) ×2
HEMOSTAT POWDER KIT SURGIFOAM (HEMOSTASIS) ×1 IMPLANT
KIT BASIN OR (CUSTOM PROCEDURE TRAY) ×1 IMPLANT
KIT GRAFTMAG DEL NEURO DISP (NEUROSURGERY SUPPLIES) IMPLANT
KIT TURNOVER KIT B (KITS) ×1 IMPLANT
NDL HYPO 21X1.5 SAFETY (NEEDLE) IMPLANT
NEEDLE HYPO 21X1.5 SAFETY (NEEDLE) ×1 IMPLANT
NEEDLE HYPO 22GX1.5 SAFETY (NEEDLE) ×1 IMPLANT
NS IRRIG 1000ML POUR BTL (IV SOLUTION) ×1 IMPLANT
PACK LAMINECTOMY NEURO (CUSTOM PROCEDURE TRAY) ×1 IMPLANT
PAD ARMBOARD 7.5X6 YLW CONV (MISCELLANEOUS) ×3 IMPLANT
PATTIES SURGICAL .5 X1 (DISPOSABLE) IMPLANT
PUTTY DBM 5CC CALC GRAN (Putty) IMPLANT
ROD CURVED TI 6.35X50 (Rod) IMPLANT
SCREW PA DLX CREO 7.5X50 (Screw) IMPLANT
SPIKE FLUID TRANSFER (MISCELLANEOUS) ×1 IMPLANT
SPONGE NEURO XRAY DETECT 1X3 (DISPOSABLE) IMPLANT
SPONGE SURGIFOAM ABS GEL 100 (HEMOSTASIS) IMPLANT
SPONGE T-LAP 4X18 ~~LOC~~+RFID (SPONGE) IMPLANT
STRIP CLOSURE SKIN 1/2X4 (GAUZE/BANDAGES/DRESSINGS) ×1 IMPLANT
SUT VIC AB 1 CT1 18XBRD ANBCTR (SUTURE) ×2 IMPLANT
SUT VIC AB 1 CT1 8-18 (SUTURE) ×2
SUT VIC AB 2-0 CP2 18 (SUTURE) ×2 IMPLANT
SYR 20ML LL LF (SYRINGE) IMPLANT
TOWEL GREEN STERILE (TOWEL DISPOSABLE) ×1 IMPLANT
TOWEL GREEN STERILE FF (TOWEL DISPOSABLE) ×1 IMPLANT
TRAY FOLEY MTR SLVR 14FR STAT (SET/KITS/TRAYS/PACK) IMPLANT
TRAY FOLEY MTR SLVR 16FR STAT (SET/KITS/TRAYS/PACK) ×1 IMPLANT
WATER STERILE IRR 1000ML POUR (IV SOLUTION) ×1 IMPLANT

## 2022-05-21 NOTE — Anesthesia Procedure Notes (Signed)
Procedure Name: Intubation Date/Time: 05/21/2022 12:31 PM  Performed by: Carolan Clines, CRNAPre-anesthesia Checklist: Patient identified, Emergency Drugs available, Suction available and Patient being monitored Patient Re-evaluated:Patient Re-evaluated prior to induction Oxygen Delivery Method: Circle System Utilized Preoxygenation: Pre-oxygenation with 100% oxygen Induction Type: IV induction Ventilation: Mask ventilation without difficulty and Oral airway inserted - appropriate to patient size Laryngoscope Size: Mac and 3 Grade View: Grade II Tube type: Oral Tube size: 7.0 mm Number of attempts: 1 Airway Equipment and Method: Stylet and Oral airway Placement Confirmation: ETT inserted through vocal cords under direct vision, positive ETCO2 and breath sounds checked- equal and bilateral Secured at: 22 cm Tube secured with: Tape Dental Injury: Teeth and Oropharynx as per pre-operative assessment  Comments: Grade IIb view with Mac 3.

## 2022-05-21 NOTE — Progress Notes (Signed)
Orthopedic Tech Progress Note Patient Details:  Mercedes [name] [facility] 1945-07-10 [id number]   Per RN, pt already has an LSO back brace.   Patient ID: Mercedes [name], female   DOB: 12/17/1945, 76 y.o.   MRN: [id number]  [name] 05/21/2022, 5:59 PM

## 2022-05-21 NOTE — H&P (Signed)
Subjective: The patient is a 76 year old white female who is complained of back and left great and right leg pain consistent with neurogenic claudication.  She failed medical management and was worked up with lumbar x-rays and a lumbar MRI which demonstrated the patient had a grade 1 spondylolisthesis at L3-4 with severe spinal stenosis.  I discussed the various treatment options with her.  She has decided proceed with surgery.  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   HLD (hyperlipidemia)    under control   HTN (hypertension)    Hypothyroidism    Macular degeneration    Osteoarthritis    Rapid heartbeat    occasional, does not last long   Tobacco abuse     Past Surgical History:  Procedure Laterality Date   CATARACT EXTRACTION     CHOLECYSTECTOMY N/A 03/17/2014   Procedure: LAPAROSCOPIC CHOLECYSTECTOMY WITH INTRAOPERATIVE CHOLANGIOGRAM ;  Surgeon: Fanny Skates, MD;  Location: [facility] ORS;  Service: General;  Laterality: N/A;   COLONOSCOPY     removed polyps   SIGMOIDOSCOPY      Allergies  Allergen Reactions   Ace Inhibitors Cough   Clarithromycin     REACTION: GI upset   Fenofibrate Itching    REACTION: itching- pt unsure   Lipitor [Atorvastatin]     myalgias   Niacin     "burning", made pt pass out   Penicillins     REACTION: Purpura   Sulfasalazine     REACTION: Purpura   Tetracycline Hcl     REACTION: GI upset    Social History   Tobacco Use   Smoking status: Former    Packs/day: 0.25    Years: 15.00    Total pack years: 3.75    Types: Cigarettes    Quit date: 07/04/2010    Years since quitting: 11.8   Smokeless tobacco: Never  Substance Use Topics   Alcohol use: Not Currently    Comment: Wine    Family History  Problem Relation Age of Onset   Ulcers Father    Angina Mother     Hypertension Brother    Prostate cancer Brother    Colon cancer Neg Hx    Breast cancer Neg Hx    Esophageal cancer Neg Hx    Stomach cancer Neg Hx    Rectal cancer Neg Hx    Prior to Admission medications   Medication Sig Start Date End Date Taking? Authorizing Provider  bisacodyl 5 MG EC tablet Take 5 mg by mouth daily as needed for moderate constipation.   Yes [provider]  cholecalciferol (VITAMIN D) 1000 units tablet Take 1,000 Units by mouth 2 (two) times a day.    Yes [provider]  CRANBERRY PO Take 1 tablet by mouth in the morning and at bedtime.   Yes [provider]  faricimab-svoa (VABYSMO) 6 MG/0.05ML SOLN intravitreal injection 0.05 mLs (6 mg total) by Intravitreal route as directed. 12/12/21  Yes Tonia Ghent, MD  HYDROcodone-acetaminophen (NORCO/VICODIN) 5-325 MG tablet Take 1 tablet by mouth every 4 (four) hours as needed for moderate pain.   Yes [provider]  levothyroxine (SYNTHROID) 200 MCG tablet Take 1 tablet (200 mcg total) by mouth daily before breakfast. 12/12/21  Yes  Tonia Ghent, MD  levothyroxine (SYNTHROID) 25 MCG tablet TAKE 1 TABLET BY MOUTH ONCE DAILY BEFORE  BREAKFAST  ON  MONDAY  THROUGH  FRIDAYS SKIP ON SATURDAY AND SUNDAY 12/12/21  Yes Tonia Ghent, MD  metoprolol (TOPROL-XL) 200 MG 24 hr tablet Take 1 tablet (200 mg total) by mouth every morning. 12/12/21  Yes Tonia Ghent, MD  Multiple Vitamin (MULTIVITAMIN WITH MINERALS) TABS tablet Take 1 tablet by mouth daily.   Yes [provider]  Multiple Vitamins-Minerals (PRESERVISION AREDS 2) CAPS Take 1 capsule by mouth 2 (two) times daily.   Yes [provider]  naproxen sodium (ALEVE) 220 MG tablet Take 220 mg by mouth daily as needed (pain).   Yes [provider]  Omega 3 1000 MG CAPS Take 2,000 mg by mouth daily.   Yes [provider]  Potassium 99 MG TABS Take 99 mg by mouth in the morning and at bedtime.   Yes  [provider]  pravastatin (PRAVACHOL) 40 MG tablet Take 1 tablet (40 mg total) by mouth daily. 12/12/21  Yes Tonia Ghent, MD  psyllium (REGULOID) 0.52 G capsule Take 1.04 g by mouth daily.   Yes [provider]  zinc gluconate 50 MG tablet Take 50 mg by mouth daily.   Yes [provider]  colchicine 0.6 MG tablet Take 1 tablet by mouth twice daily as needed 11/06/21   Eugenia Pancoast, FNP  phenazopyridine (PYRIDIUM) 200 MG tablet Take 1 tablet (200 mg total) by mouth 3 (three) times daily as needed for pain. Patient not taking: Reported on 05/10/2022 08/09/21   Melynda Ripple, MD     Review of Systems  Positive ROS: As above  All other systems have been reviewed and were otherwise negative with the exception of those mentioned in the HPI and as above.  Objective: Vital signs in last 24 hours: Temp:  [98.5 F (36.9 C)] 98.5 F (36.9 C) (11/20 0949) Pulse Rate:  [94] 94 (11/20 0949) Resp:  [18] 18 (11/20 0949) BP: (172)/(63) 172/63 (11/20 0949) SpO2:  [97 %] 97 % (11/20 0949) Weight:  [91.6 kg] 91.6 kg (11/20 0949) Estimated body mass index is 34.67 kg/m as calculated from the following:   Height as of this encounter: '5\' 4"'$  (1.626 m).   Weight as of this encounter: 91.6 kg.   General Appearance: Alert Head: Normocephalic, without obvious abnormality, atraumatic Eyes: PERRL, conjunctiva/corneas clear, EOM's intact,    Ears: Normal  Throat: Normal  Neck: Supple, Back: unremarkable Lungs: Clear to auscultation bilaterally, respirations unlabored Heart: Regular rate and rhythm, no murmur, rub or gallop Abdomen: Soft, non-tender Extremities: Extremities normal, atraumatic, no cyanosis or edema Skin: unremarkable  NEUROLOGIC:   Mental status: alert and oriented,Motor Exam - grossly normal Sensory Exam - grossly normal Reflexes:  Coordination - grossly normal Gait - grossly normal Balance - grossly normal Cranial Nerves: I: smell Not tested   II: visual acuity  OS: Normal  OD: Normal   II: visual fields Full to confrontation  II: pupils Equal, round, reactive to light  III,VII: ptosis None  III,IV,VI: extraocular muscles  Full ROM  V: mastication Normal  V: facial light touch sensation  Normal  V,VII: corneal reflex  Present  VII: facial muscle function - upper  Normal  VII: facial muscle function - lower Normal  VIII: hearing Not tested  IX: soft palate elevation  Normal  IX,X: gag reflex Present  XI: trapezius strength  5/5  XI: sternocleidomastoid  strength 5/5  XI: neck flexion strength  5/5  XII: tongue strength  Normal    Data Review Lab Results  Component Value Date   WBC 6.6 05/16/2022   HGB 15.5 (H) 05/16/2022   HCT 46.2 (H) 05/16/2022   MCV 100.7 (H) 05/16/2022   PLT 202 05/16/2022   Lab Results  Component Value Date   NA 141 05/16/2022   K 4.2 05/16/2022   CL 105 05/16/2022   CO2 26 05/16/2022   BUN 18 05/16/2022   CREATININE 0.61 05/16/2022   GLUCOSE 131 (H) 05/16/2022   No results found for: "INR", "PROTIME"  Assessment/Plan: L3-4 spondylolisthesis, spinal stenosis, neurogenic claudication, lumbago, lumbar radiculopathy: I have discussed situation with the patient.  I reviewed her imaging studies with her and pointed out the abnormalities.  We have discussed the various treatment options including surgery.  I described the surgical treatment option of an L3-4 decompression, instrumentation and fusion.  I have shown her surgical models.  I have given her a surgical pamphlet.  We have discussed the risk, benefits, alternatives, expected postop course, and likelihood of achieving our goals with surgery.  I have answered all her questions.  She has decided proceed with surgery.   Ophelia Charter 05/21/2022 11:56 AM

## 2022-05-21 NOTE — Anesthesia Postprocedure Evaluation (Signed)
Anesthesia Post Note  Patient: Mercedes [name]  Procedure(s) Performed: Posterior Lumbar Interbody Fusion ,Instrumented Prothesis ,Posterior  Instrumentation, Lumbar three-four     Patient location during evaluation: PACU Anesthesia Type: General Level of consciousness: awake and alert, oriented and patient cooperative Pain management: pain level controlled Vital Signs Assessment: post-procedure vital signs reviewed and stable Respiratory status: spontaneous breathing, nonlabored ventilation and respiratory function stable Cardiovascular status: blood pressure returned to baseline and stable Postop Assessment: no apparent nausea or vomiting Anesthetic complications: no Comments: 170/63 preop   No notable events documented.  Last Vitals:  Vitals:   05/21/22 1630 05/21/22 1645  BP: (!) 181/82 (!) 176/72  Pulse: (!) 102 (!) 105  Resp: 19 17  Temp: 36.8 C   SpO2: 100% 96%    Last Pain:  Vitals:   05/21/22 1630  TempSrc:   PainSc: [place]

## 2022-05-21 NOTE — Transfer of Care (Signed)
Immediate Anesthesia Transfer of Care Note  Patient: Mercedes [name]  Procedure(s) Performed: Posterior Lumbar Interbody Fusion ,Instrumented Prothesis ,Posterior  Instrumentation, Lumbar three-four  Patient Location: PACU  Anesthesia Type:General  Level of Consciousness: drowsy and patient cooperative  Airway & Oxygen Therapy: Patient Spontanous Breathing and Patient connected to face mask oxygen  Post-op Assessment: Post -op Vital signs reviewed and stable  Post vital signs: Reviewed and stable  Last Vitals:  Vitals Value Taken Time  BP 154/95 05/21/22 1606  Temp    Pulse 100 05/21/22 1609  Resp 14 05/21/22 1609  SpO2 100 % 05/21/22 1609  Vitals shown include unvalidated device data.  Last Pain:  Vitals:   05/21/22 0949  TempSrc: Oral         Complications: No notable events documented.

## 2022-05-21 NOTE — Op Note (Signed)
Brief history: The patient is a 76 year old white female who has complained of back and bilateral leg pain consistent with neurogenic claudication.  She has failed medical management and was worked up with a lumbar MRI which demonstrated a lumbar spine listhesis and spinal stenosis.  I discussed the various treatment options with her.  She has decided proceed with surgery.  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 Etc. requiring a wide decompression of the nerve roots.);  L3-4 transforaminal lumbar interbody fusion with local morselized autograft bone and Zimmer DBM; insertion of interbody prosthesis at L3-4 (globus peek expandable interbody prosthesis); posterior nonsegmental instrumentation from L3 to L4 with globus titanium pedicle screws and rods; posterior lateral arthrodesis at L3-4 with local morselized autograft bone and Zimmer DBM.  Surgeon: Dr. Earle Gell  Asst.: Arnetha Massy, NP  Anesthesia: Gen. endotracheal  Estimated blood loss: 250 cc  Drains: None  Complications: None  Description of procedure: The patient was brought to the operating room by the anesthesia team. General endotracheal anesthesia was induced. The patient was turned to the prone position on the Wilson frame. The patient's lumbosacral region was then prepared with Betadine scrub and Betadine solution. Sterile drapes were applied.  I then injected the area to be incised with Marcaine with epinephrine solution. I then used the scalpel to make  a linear midline incision over the L3-4 interspace. I then used electrocautery to perform a bilateral subperiosteal dissection exposing the spinous process and lamina of L3 and L4. We then obtained intraoperative radiograph to confirm our location. We then inserted the Verstrac retractor to provide exposure.  I began the decompression by using the high speed drill to perform laminotomies at L3-4 bilaterally. We then used the Kerrison punches to widen the laminotomy and removed the ligamentum flavum at L3-4 bilaterally. We used the Kerrison punches to remove the medial facets at L3-4 bilaterally, we removed the left L3-4 facet. We performed wide foraminotomies about the bilateral L3 and L4 nerve roots completing the decompression.  We now turned our attention to the posterior lumbar interbody fusion. I used a scalpel to incise the intervertebral disc at L3-4 bilaterally. I then performed a partial intervertebral discectomy at L3-4 bilaterally using the pituitary forceps. We prepared the vertebral endplates at Y6-3 bilaterally for the fusion by removing the soft tissues with the curettes. We then used the trial spacers to pick the appropriate sized interbody prosthesis. We prefilled his prosthesis with a combination of local morselized autograft bone that we obtained during the decompression as well as Zimmer DBM. We inserted the prefilled prosthesis into the interspace at L3-4 for the left, we then turned and expanded the prosthesis. There was a good snug fit of the prosthesis in the interspace. We then filled and the remainder of the intervertebral disc space with local morselized autograft bone and Zimmer DBM. This completed the posterior lumbar interbody arthrodesis.  During the decompression and insertion of the prosthesis the assistant protected the thecal sac and nerve roots with the D'Errico retractor.  We now turned attention to the instrumentation. Under fluoroscopic guidance we cannulated the  bilateral L3 and L4 pedicles with  the bone probe. We then removed the bone probe. We then tapped the pedicle with a 6.55 millimeter tap. We then removed the tap. We probed inside the tapped pedicle with a ball probe to rule out cortical breaches. We then inserted a 7.5 x 50 millimeter pedicle screw into the L3 and L4 pedicles bilaterally under fluoroscopic guidance. We then palpated along the medial aspect of the pedicles to rule out cortical breaches. There were none. The nerve roots were not injured. We then connected the unilateral pedicle screws with a lordotic rod. We compressed the construct and secured the rod in place with the caps. We then tightened the caps appropriately. This completed the instrumentation from L3-4.  We now turned our attention to the posterior lateral arthrodesis at L3-4. We used the high-speed drill to decorticate the remainder of the facets, pars, transverse process at L3-4. We then applied a combination of local morselized autograft bone and Zimmer DBM over these decorticated posterior lateral structures. This completed the posterior lateral arthrodesis.  We then obtained hemostasis using bipolar electrocautery. We irrigated the wound out with saline solution. We inspected the thecal sac and nerve roots and noted they were well decompressed. We then removed the retractor.  We injected Exparel . We reapproximated patient's thoracolumbar fascia with interrupted #1 Vicryl suture. We reapproximated patient's subcutaneous tissue with interrupted 2-0 Vicryl suture. The reapproximated patient's skin with Steri-Strips and benzoin. The wound was then coated with bacitracin ointment. A sterile dressing was applied. The drapes were removed. The patient was subsequently returned to the supine position where they were extubated by the anesthesia team. He was then transported to the post anesthesia care unit in stable condition. All sponge instrument and needle counts were reportedly correct at  the end of this case.

## 2022-05-21 NOTE — Anesthesia Preprocedure Evaluation (Addendum)
Anesthesia Evaluation  Patient identified by MRN, date of birth, ID band Patient awake    Reviewed: Allergy & Precautions, NPO status , Patient's Chart, lab work & pertinent test results  Airway Mallampati: II  TM Distance: >3 FB Neck ROM: Full    Dental no notable dental hx.    Pulmonary former smoker   Pulmonary exam normal        Cardiovascular hypertension, Pt. on medications and Pt. on home beta blockers  Rhythm:Regular Rate:Normal     Neuro/Psych  Headaches  negative psych ROS   GI/Hepatic Neg liver ROS,GERD  ,,  Endo/Other  Hypothyroidism    Renal/GU negative Renal ROS  negative genitourinary   Musculoskeletal  (+) Arthritis , Osteoarthritis,    Abdominal Normal abdominal exam  (+)   Peds  Hematology negative hematology ROS (+)   Anesthesia Other Findings   Reproductive/Obstetrics                             Anesthesia Physical Anesthesia Plan  ASA: 2  Anesthesia Plan: General   Post-op Pain Management: Celebrex PO (pre-op)* and Tylenol PO (pre-op)*   Induction: Intravenous  PONV Risk Score and Plan: 3 and Ondansetron, Dexamethasone and Treatment may vary due to age or medical condition  Airway Management Planned: Mask and Oral ETT  Additional Equipment: None  Intra-op Plan:   Post-operative Plan: Extubation in OR  Informed Consent: I have reviewed the patients History and Physical, chart, labs and discussed the procedure including the risks, benefits and alternatives for the proposed anesthesia with the patient or authorized representative who has indicated his/her understanding and acceptance.     Dental advisory given  Plan Discussed with: CRNA  Anesthesia Plan Comments: (Lab Results      Component                Value               Date                      WBC                      6.6                 05/16/2022                HGB                      15.5  (H)            05/16/2022                HCT                      46.2 (H)            05/16/2022                MCV                      100.7 (H)           05/16/2022                PLT                      202  05/16/2022            Lab Results      Component                Value               Date                      NA                       141                 05/16/2022                K                        4.2                 05/16/2022                CO2                      26                  05/16/2022                GLUCOSE                  131 (H)             05/16/2022                BUN                      18                  05/16/2022                CREATININE               0.61                05/16/2022                CALCIUM                  10.1                05/16/2022                GFRNONAA                 >60                 05/16/2022           )       Anesthesia Quick Evaluation

## 2022-05-22 DIAGNOSIS — M4316 Spondylolisthesis, lumbar region: Secondary | ICD-10-CM | POA: Diagnosis not present

## 2022-05-22 MED ORDER — OXYCODONE-ACETAMINOPHEN 5-325 MG PO TABS: 1.0000 | ORAL_TABLET | ORAL | Status: DC | PRN

## 2022-05-22 MED ORDER — OXYCODONE-ACETAMINOPHEN 5-325 MG PO TABS: 1.0000 | ORAL_TABLET | ORAL | 0 refills | Status: DC | PRN

## 2022-05-22 MED ORDER — CYCLOBENZAPRINE HCL 5 MG PO TABS: 5.0000 mg | ORAL_TABLET | Freq: Three times a day (TID) | ORAL | Status: DC | PRN

## 2022-05-22 MED ORDER — DOCUSATE SODIUM 100 MG PO CAPS: 100.0000 mg | ORAL_CAPSULE | Freq: Two times a day (BID) | ORAL | 0 refills | Status: DC

## 2022-05-22 MED ORDER — CYCLOBENZAPRINE HCL 5 MG PO TABS: 5.0000 mg | ORAL_TABLET | Freq: Three times a day (TID) | ORAL | 0 refills | Status: DC | PRN

## 2022-05-22 MED FILL — Thrombin For Soln 5000 Unit: CUTANEOUS | Qty: 5000 | Status: AC

## 2022-05-22 NOTE — Evaluation (Signed)
Occupational Therapy Evaluation Patient Details Name: Mercedes [name] MRN: [id number] DOB: 28-Apr-1946 Today's Date: 05/22/2022   History of Present Illness Mercedes [name] is a 76 yo female who underwent L3-4 decompression, instrumentation and fusion 11/20. PMHx: cataracts, fatty liver, GERD, gout, HLD, HTN, hypothyroidism, OA   Clinical Impression    Mercedes [name] was evaluated s/p the above admission list, she is indep at baseline and lives with her husband who is able to assist at d/c. Upon evaluation pt was limited by expected surgical pain and knowledge of back precautions and compensatory techniques. Overall she required generalized superivsion A for all mobility with RW and cues for management. She needed up to min A for ADLs to maintain back precautions. Ot does not need further OT at follow up. Recommend d/c home with support of family.      Recommendations for follow up therapy are one component of a multi-disciplinary discharge planning process, led by the attending physician.  Recommendations may be updated based on patient status, additional functional criteria and insurance authorization.   Follow Up Recommendations  No OT follow up     Assistance Recommended at Discharge Frequent or constant Supervision/Assistance  Patient can return home with the following A little help with walking and/or transfers;A little help with bathing/dressing/bathroom;Assistance with cooking/housework;Assist for transportation;Help with stairs or ramp for entrance    Functional Status Assessment  Patient has had a recent decline in their functional status and demonstrates the ability to make significant improvements in function in a reasonable and predictable amount of time.  Equipment Recommendations  None recommended by OT       Precautions / Restrictions Precautions Precautions: Fall;Back Precaution Booklet Issued: Yes (comment) Required Braces or Orthoses: Spinal Brace Spinal Brace: Lumbar  corset Restrictions Weight Bearing Restrictions: No      Mobility Bed Mobility Overal bed mobility: Needs Assistance Bed Mobility: Rolling, Sidelying to Sit Rolling: Supervision Sidelying to sit: Supervision       General bed mobility comments: cues for log roll    Transfers Overall transfer level: Needs assistance Equipment used: Rolling walker (2 wheels) Transfers: Sit to/from Stand Sit to Stand: Supervision                  Balance Overall balance assessment: Needs assistance Sitting-balance support: Feet supported Sitting balance-Leahy Scale: Good     Standing balance support: Single extremity supported, During functional activity Standing balance-Leahy Scale: Fair                             ADL either performed or assessed with clinical judgement   ADL Overall ADL's : Needs assistance/impaired Eating/Feeding: Independent;Sitting   Grooming: Supervision/safety;Standing   Upper Body Bathing: Set up;Sitting   Lower Body Bathing: Minimal assistance;Sit to/from stand   Upper Body Dressing : Set up;Sitting   Lower Body Dressing: Minimal assistance;Sit to/from stand   Toilet Transfer: Supervision/safety;Ambulation;Rolling walker (2 wheels);Regular Toilet   Toileting- Clothing Manipulation and Hygiene: Supervision/safety;Sitting/lateral lean       Functional mobility during ADLs: Supervision/safety;Rolling walker (2 wheels) General ADL Comments: cues throughout for back precautions and compensatory techniques     Vision Baseline Vision/History: 0 No visual deficits;4 Cataracts Vision Assessment?: No apparent visual deficits     Perception Perception Perception Tested?: No   Praxis Praxis Praxis tested?: Within functional limits    Pertinent Vitals/Pain Pain Assessment Pain Assessment: Faces Faces Pain Scale: Hurts little more Pain Location: sx site Pain Descriptors /  Indicators: Discomfort, Grimacing Pain Intervention(s):  Limited activity within patient's tolerance, Monitored during session     Hand Dominance Right   Extremity/Trunk Assessment Upper Extremity Assessment Upper Extremity Assessment: Overall WFL for tasks assessed   Lower Extremity Assessment Lower Extremity Assessment: Generalized weakness   Cervical / Trunk Assessment Cervical / Trunk Assessment: Back Surgery   Communication Communication Communication: No difficulties   Cognition Arousal/Alertness: Awake/alert Behavior During Therapy: WFL for tasks assessed/performed Overall Cognitive Status: Within Functional Limits for tasks assessed                                       General Comments  VSS on RA, family present     Home Living Family/patient expects to be discharged to:: Private residence Living Arrangements: Spouse/significant other Available Help at Discharge: Family;Available 24 hours/day Type of Home: House Home Access: Stairs to enter [employer] of Steps: 2-3 Entrance Stairs-Rails: Right;Left Home Layout: One level     Bathroom Shower/Tub: Occupational psychologist: Standard     Home Equipment: Conservation officer, nature (2 wheels);BSC/3in1;Grab bars - tub/shower          Prior Functioning/Environment Prior Level of Function : Independent/Modified Independent;Driving             Mobility Comments: SPC intermittently ADLs Comments: indep, drives        OT Problem List: Decreased strength;Decreased range of motion;Decreased activity tolerance;Impaired balance (sitting and/or standing);Decreased safety awareness;Decreased knowledge of use of DME or AE;Decreased knowledge of precautions;Pain         OT Goals(Current goals can be found in the care plan section) Acute Rehab OT Goals Patient Stated Goal: home OT Goal Formulation: With patient Time For Goal Achievement: 05/22/22 Potential to Achieve Goals: Good   AM-PAC OT "6 Clicks" Daily Activity     Outcome Measure  Help from another person eating meals?: None Help from another person taking care of personal grooming?: A Little Help from another person toileting, which includes using toliet, bedpan, or urinal?: A Little Help from another person bathing (including washing, rinsing, drying)?: A Little Help from another person to put on and taking off regular upper body clothing?: None Help from another person to put on and taking off regular lower body clothing?: A Little 6 Click Score: 20   End of Session Equipment Utilized During Treatment: Rolling walker (2 wheels);Back brace Nurse Communication: Mobility status  Activity Tolerance: Patient tolerated treatment well Patient left: in bed;with call bell/phone within reach;with family/visitor present  OT Visit Diagnosis: Unsteadiness on feet (R26.81);Other abnormalities of gait and mobility (R26.89);Muscle weakness (generalized) (M62.81);Pain                Time: 0354-6568 OT Time Calculation (min): 22 min Charges:  OT General Charges $OT Visit: 1 Visit OT Evaluation $OT Eval Moderate Complexity: 1 Mod   Rhythm Wigfall D Causey 05/22/2022, 10:14 AM

## 2022-05-22 NOTE — Progress Notes (Signed)
PT Cancellation Note and Discharge  Patient Details Name: Mercedes [name] MRN: [id number] DOB: 1946/06/10   Cancelled Treatment:    Reason Eval/Treat Not Completed: PT screened, no needs identified, will sign off. Discussed pt case with OT who reports pt is currently mobilizing without assistance and does not require a formal PT evaluation at this time. PT signing off. If needs change, please reconsult.     Thelma Comp 05/22/2022, 10:01 AM  Rolinda Roan, PT, DPT Acute Rehabilitation Services Secure Chat Preferred Office: [phone]

## 2022-05-22 NOTE — Progress Notes (Signed)
Pt doing well. Pt and family given D/C instructions with verbal understanding. Rx's were sent to the pharmacy by MD. Pt's dressing was changed prior to D/C. Pt D/C'd home via wheelchair per MD order. Pt is stable @ D/C and has no other needs at this time. Holli Humbles, RN

## 2022-05-22 NOTE — Discharge Summary (Signed)
Physician Discharge Summary  Patient ID: Mercedes [name] MRN: [id number] DOB/AGE: 10/31/1945 76 y.o.  Admit date: 05/21/2022 Discharge date: 05/22/2022  Admission Diagnoses: Lumbar spondylolisthesis, lumbar spinal stenosis, lumbar radiculopathy, neurogenic claudication, lumbago  Discharge Diagnoses: The same Principal Problem:   Spondylolisthesis of lumbar region   Discharged Condition: good  Hospital Course: I performed an L3-4 decompression, instrumentation and fusion on the patient 05/21/2022.  The patient's postoperative course was unremarkable.  On postoperative day #1 she felt well and requested discharge to home.  The patient, her husband, and grandson were given verbal and written discharge instructions.  All her questions were answered.  Consults: PT, OT, care management Significant Diagnostic Studies: None Treatments: L3-4 decompression, instrumentation and fusion. Discharge Exam: Blood pressure (!) 153/73, pulse (!) 109, temperature 98.6 F (37 C), temperature source Oral, resp. rate 20, height '5\' 4"'$  (1.626 m), weight 91.6 kg, SpO2 99 %. The patient is alert and pleasant.  Her strength is normal.  Her dressing has a small blood stain.  Disposition: Home  Discharge Instructions     Call MD for:  difficulty breathing, headache or visual disturbances   Complete by: As directed    Call MD for:  extreme fatigue   Complete by: As directed    Call MD for:  hives   Complete by: As directed    Call MD for:  persistant dizziness or light-headedness   Complete by: As directed    Call MD for:  persistant nausea and vomiting   Complete by: As directed    Call MD for:  redness, tenderness, or signs of infection (pain, swelling, redness, odor or green/yellow discharge around incision site)   Complete by: As directed    Call MD for:  severe uncontrolled pain   Complete by: As directed    Call MD for:  temperature >100.4   Complete by: As directed    Diet - low sodium heart  healthy   Complete by: As directed    Discharge instructions   Complete by: As directed    Call [phone] for a followup appointment. Take a stool softener while you are using pain medications.   Driving Restrictions   Complete by: As directed    Do not drive for 2 weeks.   Increase activity slowly   Complete by: As directed    Lifting restrictions   Complete by: As directed    Do not lift more than 5 pounds. No excessive bending or twisting.   May shower / Bathe   Complete by: As directed    Remove the dressing for 3 days after surgery.  You may shower, but leave the incision alone.   Remove dressing in 48 hours   Complete by: As directed       Allergies as of 05/22/2022       Reactions   Ace Inhibitors Cough   Clarithromycin    REACTION: GI upset   Fenofibrate Itching   REACTION: itching- pt unsure   Lipitor [atorvastatin]    myalgias   Niacin    "burning", made pt pass out   Penicillins    REACTION: Purpura   Sulfasalazine    REACTION: Purpura   Tetracycline Hcl    REACTION: GI upset        Medication List     STOP taking these medications    HYDROcodone-acetaminophen 5-325 MG tablet Commonly known as: NORCO/VICODIN   naproxen sodium 220 MG tablet Commonly known as: ALEVE  TAKE these medications    bisacodyl 5 MG EC tablet Generic drug: bisacodyl Take 5 mg by mouth daily as needed for moderate constipation.   cholecalciferol 1000 units tablet Commonly known as: VITAMIN D Take 1,000 Units by mouth 2 (two) times a day.   colchicine 0.6 MG tablet Take 1 tablet by mouth twice daily as needed   CRANBERRY PO Take 1 tablet by mouth in the morning and at bedtime.   cyclobenzaprine 5 MG tablet Commonly known as: FLEXERIL Take 1 tablet (5 mg total) by mouth 3 (three) times daily as needed for muscle spasms.   docusate sodium 100 MG capsule Commonly known as: COLACE Take 1 capsule (100 mg total) by mouth 2 (two) times daily.    levothyroxine 200 MCG tablet Commonly known as: SYNTHROID Take 1 tablet (200 mcg total) by mouth daily before breakfast.   levothyroxine 25 MCG tablet Commonly known as: SYNTHROID TAKE 1 TABLET BY MOUTH ONCE DAILY BEFORE  BREAKFAST  ON  MONDAY  THROUGH  FRIDAYS SKIP ON SATURDAY AND SUNDAY   metoprolol 200 MG 24 hr tablet Commonly known as: TOPROL-XL Take 1 tablet (200 mg total) by mouth every morning.   multivitamin with minerals Tabs tablet Take 1 tablet by mouth daily.   Omega 3 1000 MG Caps Take 2,000 mg by mouth daily.   oxyCODONE-acetaminophen 5-325 MG tablet Commonly known as: PERCOCET/ROXICET Take 1-2 tablets by mouth every 4 (four) hours as needed for moderate pain.   phenazopyridine 200 MG tablet Commonly known as: PYRIDIUM Take 1 tablet (200 mg total) by mouth 3 (three) times daily as needed for pain.   Potassium 99 MG Tabs Take 99 mg by mouth in the morning and at bedtime.   pravastatin 40 MG tablet Commonly known as: PRAVACHOL Take 1 tablet (40 mg total) by mouth daily.   PreserVision AREDS 2 Caps Take 1 capsule by mouth 2 (two) times daily.   psyllium 0.52 g capsule Commonly known as: REGULOID Take 1.04 g by mouth daily.   Vabysmo 6 MG/0.05ML Soln intravitreal injection Generic drug: faricimab-svoa 0.05 mLs (6 mg total) by Intravitreal route as directed.   zinc gluconate 50 MG tablet Take 50 mg by mouth daily.         Signed: Ophelia Charter 05/22/2022, 7:48 AM

## 2022-05-30 MED FILL — Sodium Chloride IV Soln 0.9%: INTRAVENOUS | Qty: 1000 | Status: AC

## 2022-05-30 MED FILL — Heparin Sodium (Porcine) Inj 1000 Unit/ML: INTRAMUSCULAR | Qty: 30 | Status: AC

## 2022-05-31 ENCOUNTER — Other Ambulatory Visit: Payer: PPO

## 2022-06-12 DIAGNOSIS — H353221 Exudative age-related macular degeneration, left eye, with active choroidal neovascularization: Secondary | ICD-10-CM | POA: Diagnosis not present

## 2022-06-12 DIAGNOSIS — M4316 Spondylolisthesis, lumbar region: Secondary | ICD-10-CM | POA: Diagnosis not present

## 2022-06-22 DIAGNOSIS — Z4789 Encounter for other orthopedic aftercare: Secondary | ICD-10-CM | POA: Diagnosis not present

## 2022-06-22 DIAGNOSIS — Z981 Arthrodesis status: Secondary | ICD-10-CM | POA: Diagnosis not present

## 2022-07-03 ENCOUNTER — Telehealth: Payer: Self-pay

## 2022-07-03 DIAGNOSIS — Z1231 Encounter for screening mammogram for malignant neoplasm of breast: Secondary | ICD-10-CM | POA: Diagnosis not present

## 2022-07-03 LAB — HM MAMMOGRAPHY

## 2022-07-03 NOTE — Telephone Encounter (Signed)
Per appt notes pt has appt already scheduled with Dr Silvio Pate on 07/04/22 at 7:30. Sending note to Dr Silvio Pate and Silvio Pate pool.

## 2022-07-03 NOTE — Telephone Encounter (Signed)
Okay---I will assess her at tomorrow's visit

## 2022-07-03 NOTE — Telephone Encounter (Signed)
[place] Night - Client TELEPHONE ADVICE RECORD [facility] Patient Name: Mercedes [name] Gender: Female DOB: 11-03-1945 Age: 77 Y 9 M 11 D Return Phone Number: [phone] (Primary), [phone] (Secondary) Address: City/ State/ Zip: [place]  [address] Client [place] Night - Client Client Site [place] - Night Provider Renford Dills - MD Contact Type Call Who Is Calling Patient / Member / Family / Caregiver Call Type Triage / Clinical Relationship To Patient Self Return Phone Number [phone] (Primary) Chief Complaint Vaginal Pain Reason for Call Symptomatic / Request for [place] states she has a burning sensation in her vagina. Has been referred to Gynecologist but this hasn't been effective in treating her symptoms. This has been going on for 3 weeks. Translation No Nurse Assessment Nurse: Micki Riley, RN, Domenick Gong Date/Time ([place] Time): 07/03/2022 8:18:02 AM Confirm and document reason for call. If symptomatic, describe symptoms. ---Caller states she has been having internal vaginal burning for the past 3 weeks, has been seen at GYN & given tx that are not working. At times has minimal pink vaginal discharge & increased urinary frequency. Denies fever or any other symptoms. Does the patient have any new or worsening symptoms? ---Yes Will a triage be completed? ---Yes Related visit to physician within the last 2 weeks? ---N/A Does the PT have any chronic conditions? (i.e. diabetes, asthma, this includes High risk factors for pregnancy, etc.) ---Unknown Is this a behavioral health or substance abuse call? ---No Guidelines Guideline Title Affirmed Question Affirmed Notes Nurse Date/Time (Eastern Time) Vaginal Symptoms [1] SEVERE pain AND [2] not improved 2 hours after pain medicine Cazares, RN, Domenick Gong 07/03/2022 8:19:38  AM Disp. Time [place]il[place] Ghazi Time) Disposition Final User 07/03/2022 8:21:24 AM See HCP within 4 Hours (or PCP triage) Yes Cazares, RN, Domenick Gong PLEASE NOTE: All timestamps contained within this report are represented as [place] Standard Time. CONFIDENTIALTY NOTICE: This fax transmission is intended only for the addressee. It contains information that is legally privileged, confidential or otherwise protected from use or disclosure. If you are not the intended recipient, you are strictly prohibited from reviewing, disclosing, copying using or disseminating any of this information or taking any action in reliance on or regarding this information. If you have received this fax in error, please notify us immediately by telephone so that we can arrange for its return to [place]. Phone: [phone], Toll-Free: [phone], Fax: [phone] Page: 2 of 2 Call Id: [id number] Final Disposition 07/03/2022 8:21:24 AM See HCP within 4 Hours (or PCP triage) Yes Micki Riley, RN, Domenick Gong Caller Disagree/Comply Comply Caller Understands Yes PreDisposition Did not know what to do Care Advice Given Per Guideline SEE HCP (OR PCP TRIAGE) WITHIN 4 HOURS: CARE ADVICE given per Vaginal Symptoms (Adult) guideline. * You become worse CALL BACK IF: Referrals REFERRED TO PCP OFFIC

## 2022-07-04 ENCOUNTER — Encounter: Payer: Self-pay | Admitting: Internal Medicine

## 2022-07-04 ENCOUNTER — Other Ambulatory Visit: Payer: PPO

## 2022-07-04 ENCOUNTER — Ambulatory Visit: Payer: PPO | Admitting: Family Medicine

## 2022-07-04 ENCOUNTER — Ambulatory Visit (INDEPENDENT_AMBULATORY_CARE_PROVIDER_SITE_OTHER): Payer: PPO | Admitting: Internal Medicine

## 2022-07-04 VITALS — BP 120/64 | HR 96 | Temp 97.7°F | Ht 64.0 in | Wt 194.0 lb

## 2022-07-04 DIAGNOSIS — N3000 Acute cystitis without hematuria: Secondary | ICD-10-CM | POA: Diagnosis not present

## 2022-07-04 LAB — POC URINALSYSI DIPSTICK (AUTOMATED)
Bilirubin, UA: NEGATIVE
Glucose, UA: NEGATIVE
Ketones, UA: NEGATIVE
Nitrite, UA: POSITIVE
Protein, UA: POSITIVE — AB
Spec Grav, UA: >=1.03 — AB (ref 1.010–1.025)
Urobilinogen, UA: 0.2 E.U./dL
pH, UA: 5.5 (ref 5.0–8.0)

## 2022-07-04 MED ORDER — CIPROFLOXACIN HCL 250 MG PO TABS: 250.0000 mg | ORAL_TABLET | Freq: Two times a day (BID) | ORAL | 1 refills | Status: DC

## 2022-07-04 NOTE — Addendum Note (Signed)
Addended by: Pilar Grammes on: 07/04/2022 08:02 AM   Modules accepted: Orders

## 2022-07-04 NOTE — Progress Notes (Signed)
Subjective:    Patient ID: Mercedes [name], female    DOB: 1946-06-15, 77 y.o.   MRN: [id number]  HPI Here due to urinary symptoms  Having burning sensation--in vaginal Up every hour to go to the bathroom---pain at times (especially at the end) Decreased urine at times  Had used the K-Y --but seems to worsen things Vaginal estrogen also made things worse  Still on cranberry---but not the pyridium now  No fever  Current Outpatient Medications on File Prior to Visit  Medication Sig Dispense Refill   bisacodyl 5 MG EC tablet Take 5 mg by mouth daily as needed for moderate constipation.     cholecalciferol (VITAMIN D) 1000 units tablet Take 1,000 Units by mouth 2 (two) times a day.      colchicine 0.6 MG tablet Take 1 tablet by mouth twice daily as needed 60 tablet 0   CRANBERRY PO Take 1 tablet by mouth in the morning and at bedtime.     faricimab-svoa (VABYSMO) 6 MG/0.05ML SOLN intravitreal injection 0.05 mLs (6 mg total) by Intravitreal route as directed. 0.05 mL 0   levothyroxine (SYNTHROID) 200 MCG tablet Take 1 tablet (200 mcg total) by mouth daily before breakfast. 90 tablet 3   levothyroxine (SYNTHROID) 25 MCG tablet TAKE 1 TABLET BY MOUTH ONCE DAILY BEFORE  BREAKFAST  ON  MONDAY  THROUGH  FRIDAYS SKIP ON SATURDAY AND SUNDAY 90 tablet 3   metoprolol (TOPROL-XL) 200 MG 24 hr tablet Take 1 tablet (200 mg total) by mouth every morning. 90 tablet 3   Multiple Vitamin (MULTIVITAMIN WITH MINERALS) TABS tablet Take 1 tablet by mouth daily.     Multiple Vitamins-Minerals (PRESERVISION AREDS 2) CAPS Take 1 capsule by mouth 2 (two) times daily.     Omega 3 1000 MG CAPS Take 2,000 mg by mouth daily.     Potassium 99 MG TABS Take 99 mg by mouth in the morning and at bedtime.     pravastatin (PRAVACHOL) 40 MG tablet Take 1 tablet (40 mg total) by mouth daily. 90 tablet 3   psyllium (REGULOID) 0.52 G capsule Take 1.04 g by mouth daily.     zinc gluconate 50 MG tablet Take 50 mg by mouth  daily.     No current facility-administered medications on file prior to visit.    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   HLD (hyperlipidemia)    under control   HTN (hypertension)    Hypothyroidism    Macular degeneration    Osteoarthritis    Rapid heartbeat    occasional, does not last long   Tobacco abuse     Past Surgical History:  Procedure Laterality Date   CATARACT EXTRACTION     CHOLECYSTECTOMY N/A 03/17/2014  Procedure: LAPAROSCOPIC CHOLECYSTECTOMY WITH INTRAOPERATIVE CHOLANGIOGRAM ;  Surgeon: Fanny Skates, MD;  Location: [facility];  Service: General;  Laterality: N/A;   COLONOSCOPY     removed polyps   SIGMOIDOSCOPY      Family History  Problem Relation Age of Onset   Ulcers Father    Angina Mother    Hypertension Brother    Prostate cancer Brother    Colon cancer Neg Hx    Breast cancer Neg Hx    Esophageal cancer Neg Hx    Stomach cancer Neg Hx    Rectal cancer Neg Hx     Social History   Socioeconomic History   Marital status: Married    Spouse name: Risk manager   Number of children: 2   Years of education: Not on file   Highest education level: Not on file  Occupational History   Occupation: Teacher-retired  Tobacco Use   Smoking status: Former    Packs/day: 0.25    Years: 15.00    Total pack years: 3.75    Types: Cigarettes    Quit date: 07/04/2010    Years since quitting: 12.0   Smokeless tobacco: Never   Vaping Use   Vaping Use: Never used  Substance and Sexual Activity   Alcohol use: Not Currently    Comment: Wine   Drug use: No   Sexual activity: Not Currently  Other Topics Concern   Not on file  Social History Narrative   Married 1964   2 kids, in [place].  2 grandkids   Volunteered prev with [employer]   Enjoyed golf but gave it up after her husband had a stroke   Social Determinants of Health   Financial Resource Strain: Low Risk  (08/05/2020)   Overall Financial Resource Strain (CARDIA)    Difficulty of Paying Living Expenses: Not hard at all  Food Insecurity: No Food Insecurity (08/05/2020)   Hunger Vital Sign    Worried About Running Out of Food in the Last Year: Never true    [place] in the Last Year: Never true  Transportation Needs: No Transportation Needs (08/05/2020)   PRAPARE - Hydrologist (Medical): No    Lack of Transportation (Non-Medical): No  Physical Activity: Inactive (08/05/2020)   Exercise Vital Sign    Days of Exercise per Week: 0 days    Minutes of Exercise per Session: 0 min  Stress: No Stress Concern Present (08/05/2020)   [place]    Feeling of Stress : Not at all  Social Connections: Not on file  Intimate Partner Violence: Not At Risk (08/05/2020)   Humiliation, Afraid, Rape, and Kick questionnaire    Fear of Current or Ex-Partner: No    Emotionally Abused: No    Physically Abused: No    Sexually Abused: No   Review of Systems No N/V No chills or shakes Some back pain---still recovering from back surgery (mostly better)    Objective:   Physical Exam Constitutional:      Appearance: Normal appearance.  Abdominal:     Palpations: Abdomen is soft.     Tenderness: There is no abdominal tenderness. There is no right CVA tenderness or left CVA tenderness.  Neurological:     Mental Status: She is alert.            Assessment &  Plan:

## 2022-07-04 NOTE — Addendum Note (Signed)
Addended by: Pilar Grammes on: 07/04/2022 08:17 AM   Modules accepted: Orders

## 2022-07-04 NOTE — Assessment & Plan Note (Signed)
Chronic and mostly persistent symptoms Now acute exacerbation Last culture showed ESBL Enterobacter cloacae  Multiple allergies also Will treat with cipro 250 bid x 3 days  Recommended reconsidering the E-string (estrogen Rx)

## 2022-07-05 DIAGNOSIS — Z981 Arthrodesis status: Secondary | ICD-10-CM | POA: Diagnosis not present

## 2022-07-05 DIAGNOSIS — Z4789 Encounter for other orthopedic aftercare: Secondary | ICD-10-CM | POA: Diagnosis not present

## 2022-07-07 LAB — URINE CULTURE
MICRO NUMBER:: 14382817
SPECIMEN QUALITY:: ADEQUATE

## 2022-07-11 DIAGNOSIS — S40011A Contusion of right shoulder, initial encounter: Secondary | ICD-10-CM | POA: Diagnosis not present

## 2022-07-11 DIAGNOSIS — M25511 Pain in right shoulder: Secondary | ICD-10-CM | POA: Diagnosis not present

## 2022-07-13 ENCOUNTER — Ambulatory Visit: Payer: PPO | Admitting: Rheumatology

## 2022-07-13 ENCOUNTER — Ambulatory Visit: Payer: PPO | Admitting: Family Medicine

## 2022-07-18 DIAGNOSIS — M8589 Other specified disorders of bone density and structure, multiple sites: Secondary | ICD-10-CM | POA: Diagnosis not present

## 2022-07-18 DIAGNOSIS — Z780 Asymptomatic menopausal state: Secondary | ICD-10-CM | POA: Diagnosis not present

## 2022-07-18 DIAGNOSIS — M4316 Spondylolisthesis, lumbar region: Secondary | ICD-10-CM | POA: Diagnosis not present

## 2022-07-18 LAB — HM DEXA SCAN

## 2022-07-19 ENCOUNTER — Encounter: Payer: Self-pay | Admitting: Surgery

## 2022-07-20 ENCOUNTER — Inpatient Hospital Stay: Admission: RE | Admit: 2022-07-20 | Payer: PPO | Source: Ambulatory Visit

## 2022-07-24 DIAGNOSIS — H353221 Exudative age-related macular degeneration, left eye, with active choroidal neovascularization: Secondary | ICD-10-CM | POA: Diagnosis not present

## 2022-07-25 DIAGNOSIS — M25511 Pain in right shoulder: Secondary | ICD-10-CM | POA: Diagnosis not present

## 2022-07-25 DIAGNOSIS — S40011D Contusion of right shoulder, subsequent encounter: Secondary | ICD-10-CM | POA: Diagnosis not present

## 2022-08-02 ENCOUNTER — Encounter: Payer: Self-pay | Admitting: Family Medicine

## 2022-08-02 ENCOUNTER — Ambulatory Visit (INDEPENDENT_AMBULATORY_CARE_PROVIDER_SITE_OTHER): Payer: PPO | Admitting: Family Medicine

## 2022-08-02 VITALS — BP 136/92 | HR 84 | Temp 97.9°F | Ht 64.0 in | Wt 196.0 lb

## 2022-08-02 DIAGNOSIS — R350 Frequency of micturition: Secondary | ICD-10-CM | POA: Diagnosis not present

## 2022-08-02 DIAGNOSIS — E210 Primary hyperparathyroidism: Secondary | ICD-10-CM | POA: Diagnosis not present

## 2022-08-02 DIAGNOSIS — R7989 Other specified abnormal findings of blood chemistry: Secondary | ICD-10-CM

## 2022-08-02 DIAGNOSIS — M8580 Other specified disorders of bone density and structure, unspecified site: Secondary | ICD-10-CM | POA: Diagnosis not present

## 2022-08-02 LAB — VITAMIN D 25 HYDROXY (VIT D DEFICIENCY, FRACTURES): VITD: 45.98 ng/mL (ref 30.00–100.00)

## 2022-08-02 NOTE — Progress Notes (Signed)
T score -2.1.   Frax hip 4%.   Vit D pending.  She has f/u pending with Dr. Harlow Asa.  I will update Dr. Harlow Asa re: pending labs.    D/w pt about DXA results and osteoporosis path/phys in general, including vit D and calcium.  Reasonable to get labs completed and then follow-up with general surgery.  Husband was recently hospitalized.  She is awaiting MR re: shoulder pain.  She has neurosurgery f/u pending.    She has been waking up hourly to urinate, similar in the day.  She isn't leaking.  Saw urogyn, on estradiol cream.  Told the patient that I need to talk to Dr. Wannetta Sender about options.    Meds, vitals, and allergies reviewed.   ROS: Per HPI unless specifically indicated in ROS section   GEN: nad, alert and oriented HEENT: ncat NECK: supple w/o LA CV: rrr. PULM: ctab, no inc wob ABD: soft, +bs EXT: no edema SKIN: well perfused.

## 2022-08-02 NOTE — Patient Instructions (Addendum)
Let me check with Dr. Wannetta Sender.   I'll update Dr. Harlow Asa.   Go to the lab on the way out.   If you have mychart we'll likely use that to update you.    Take care.  Glad to see you.

## 2022-08-03 ENCOUNTER — Ambulatory Visit: Payer: PPO | Admitting: Rheumatology

## 2022-08-03 DIAGNOSIS — Z4789 Encounter for other orthopedic aftercare: Secondary | ICD-10-CM | POA: Diagnosis not present

## 2022-08-03 DIAGNOSIS — Z981 Arthrodesis status: Secondary | ICD-10-CM | POA: Diagnosis not present

## 2022-08-03 LAB — PTH, INTACT AND CALCIUM
Calcium: 10.3 mg/dL (ref 8.6–10.4)
PTH: 103 pg/mL — ABNORMAL HIGH (ref 16–77)

## 2022-08-05 ENCOUNTER — Telehealth: Payer: Self-pay | Admitting: Family Medicine

## 2022-08-05 NOTE — Assessment & Plan Note (Signed)
T score -2.1.   Frax hip 4%.   I appreciate input from Dr. Harlow Asa.  Appointment with him is pending. See notes on labs, we will forward those results.

## 2022-08-05 NOTE — Assessment & Plan Note (Signed)
I will ask for input from Dr. Wannetta Sender before we make any med changes.

## 2022-08-05 NOTE — Telephone Encounter (Signed)
This patient is having significant urinary frequency without leaking and I did not want to deviate from your plan.  She is using estrogen cream.  She is not on treatment for overactive bladder.  I can offer options to the patient but if you have specific advice and please let me know.  Thanks.

## 2022-08-07 ENCOUNTER — Ambulatory Visit
Admission: RE | Admit: 2022-08-07 | Discharge: 2022-08-07 | Disposition: A | Discharge status: Home / Self Care | Payer: PPO | Source: Ambulatory Visit | Attending: Surgery | Admitting: Surgery

## 2022-08-07 DIAGNOSIS — E213 Hyperparathyroidism, unspecified: Secondary | ICD-10-CM

## 2022-08-07 DIAGNOSIS — E210 Primary hyperparathyroidism: Secondary | ICD-10-CM | POA: Diagnosis not present

## 2022-08-07 DIAGNOSIS — E034 Atrophy of thyroid (acquired): Secondary | ICD-10-CM | POA: Diagnosis not present

## 2022-08-07 DIAGNOSIS — D351 Benign neoplasm of parathyroid gland: Secondary | ICD-10-CM | POA: Diagnosis not present

## 2022-08-07 DIAGNOSIS — E041 Nontoxic single thyroid nodule: Secondary | ICD-10-CM | POA: Diagnosis not present

## 2022-08-07 MED ORDER — IOPAMIDOL (ISOVUE-300) INJECTION 61%
75.0000 mL | Freq: Once | INTRAVENOUS | Status: AC | PRN
  Administered 2022-08-07: 75 mL via INTRAVENOUS

## 2022-08-08 MED ORDER — MIRABEGRON ER 25 MG PO TB24: 25.0000 mg | ORAL_TABLET | Freq: Every day | ORAL | 2 refills | Status: DC

## 2022-08-08 NOTE — Telephone Encounter (Signed)
Please update patient.  I checked with urogynecology and it would be reasonable to try a course of Myrbetriq.  I sent the prescription in the meantime.  If she cannot get it filled or if it is not helpful/tolerated then please have her follow-up with urogynecology.  Thanks.  Greatly appreciate help from Dr. Wannetta Sender.

## 2022-08-09 ENCOUNTER — Telehealth: Payer: Self-pay | Admitting: Family Medicine

## 2022-08-09 NOTE — Telephone Encounter (Signed)
See other TE.

## 2022-08-09 NOTE — Telephone Encounter (Signed)
Patient called in and wanted to know the status on getting an Urogynecologist. Please advise. Thank you!

## 2022-08-09 NOTE — Telephone Encounter (Signed)
Spoke with patient about message below. I did receive a PA that myrbetriq was not covered by her insurance but gemtesa is. I did let patient know that the myrbetriq was not covered and would check to see what Dr. Damita Dunnings wanted to do. Patient is aware that I will call her back about this once done.

## 2022-08-10 ENCOUNTER — Ambulatory Visit: Payer: Self-pay | Admitting: Surgery

## 2022-08-10 MED ORDER — GEMTESA 75 MG PO TABS: 1.0000 | ORAL_TABLET | Freq: Every day | ORAL | 1 refills | Status: DC

## 2022-08-10 NOTE — Progress Notes (Signed)
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  Recommend proceeding with left parathyroidectomy as an outpatient procedure as we discussed in the office.  Claiborne Billings - I will enter orders for surgery.  Please forward to schedulers.  Armandina Gemma, MD [facility] Office: [phone]

## 2022-08-10 NOTE — Addendum Note (Signed)
Addended by: Tonia Ghent on: 08/10/2022 07:05 AM   Modules accepted: Orders

## 2022-08-10 NOTE — Telephone Encounter (Signed)
Patient notified of rx change and advised to call back if there are any issues getting rx.

## 2022-08-10 NOTE — Telephone Encounter (Signed)
Would try gemtesa.  Rx sent.  Thanks.

## 2022-08-14 NOTE — Progress Notes (Signed)
Anesthesia Review:  PCP: Mercedes [name] LOV 08/02/22  Cardiologist : Chest x-ray : EKG : 05/16/22  Echo : Stress test: Cardiac Cath :  Activity level:  Sleep Study/ CPAP : Fasting Blood Sugar :      / Checks Blood Sugar -- times a day:   Blood Thinner/ Instructions /Last Dose: ASA / Instructions/ Last Dose :

## 2022-08-14 NOTE — Patient Instructions (Signed)
SURGICAL WAITING ROOM VISITATION  Patients having surgery or a procedure may have no more than 2 support people in the waiting area - these visitors may rotate.    Children under the age of 48 must have an adult with them who is not the patient.  Due to an increase in RSV and influenza rates and associated hospitalizations, children ages 60 and under may not visit patients in [place].  If the patient needs to stay at the hospital during part of their recovery, the visitor guidelines for inpatient rooms apply. Pre-op nurse will coordinate an appropriate time for 1 support person to accompany patient in pre-op.  This support person may not rotate.    Please refer to the [facility] website for the visitor guidelines for Inpatients (after your surgery is over and you are in a regular room).       Your procedure is scheduled on:  08/20/2022    Report to [facility] Main Entrance    Report to admitting at   1030AM   Call this number if you have problems the morning of surgery [phone]   Do not eat food :After Midnight.   After Midnight you may have the following liquids until _ 0930_____ AM  DAY OF SURGERY  Water Non-Citrus Juices (without pulp, NO RED-Apple, White grape, White cranberry) Black Coffee (NO MILK/CREAM OR CREAMERS, sugar ok)  Clear Tea (NO MILK/CREAM OR CREAMERS, sugar ok) regular and decaf                             Plain Jell-O (NO RED)                                           Fruit ices (not with fruit pulp, NO RED)                                     Popsicles (NO RED)                                                               Sports drinks like Gatorade (NO RED)                          If you have questions, please contact your surgeon's office.     Oral Hygiene is also important to reduce your risk of infection.                                    Remember - BRUSH YOUR TEETH THE MORNING OF SURGERY WITH YOUR REGULAR  TOOTHPASTE  DENTURES WILL BE REMOVED PRIOR TO SURGERY PLEASE DO NOT APPLY "Poly grip" OR ADHESIVES!!!   Do NOT smoke after Midnight   Take these medicines the morning of surgery with A SIP OF WATER:  synthroid, toprol, gemtesa   DO NOT TAKE ANY ORAL DIABETIC MEDICATIONS DAY OF YOUR SURGERY  Bring CPAP mask and tubing day of  surgery.                              You may not have any metal on your body including hair pins, jewelry, and body piercing             Do not wear make-up, lotions, powders, perfumes/cologne, or deodorant  Do not wear nail polish including gel and S&S, artificial/acrylic nails, or any other type of covering on natural nails including finger and toenails. If you have artificial nails, gel coating, etc. that needs to be removed by a nail salon please have this removed prior to surgery or surgery may need to be canceled/ delayed if the surgeon/ anesthesia feels like they are unable to be safely monitored.   Do not shave  48 hours prior to surgery.               Men may shave face and neck.   Do not bring valuables to the hospital. [place].   Contacts, glasses, dentures or bridgework may not be worn into surgery.   Bring small overnight bag day of surgery.   DO NOT [place]. PHARMACY WILL DISPENSE MEDICATIONS LISTED ON YOUR MEDICATION LIST TO YOU DURING YOUR ADMISSION [place]!    Patients discharged on the day of surgery will not be allowed to drive home.  Someone NEEDS to stay with you for the first 24 hours after anesthesia.   Special Instructions: Bring a copy of your healthcare power of attorney and living will documents the day of surgery if you haven't scanned them before.              Please read over the following fact sheets you were given: IF [place] [phone]   If you received a COVID test during  your pre-op visit  it is requested that you wear a mask when out in public, stay away from anyone that may not be feeling well and notify your surgeon if you develop symptoms. If you test positive for Covid or have been in contact with anyone that has tested positive in the last 10 days please notify you surgeon.    [facility] - Preparing for Surgery Before surgery, you can play an important role.  Because skin is not sterile, your skin needs to be as free of germs as possible.  You can reduce the number of germs on your skin by washing with CHG (chlorahexidine gluconate) soap before surgery.  CHG is an antiseptic cleaner which kills germs and bonds with the skin to continue killing germs even after washing. Please DO NOT use if you have an allergy to CHG or antibacterial soaps.  If your skin becomes reddened/irritated stop using the CHG and inform your nurse when you arrive at Short Stay. Do not shave (including legs and underarms) for at least 48 hours prior to the first CHG shower.  You may shave your face/neck. Please follow these instructions carefully:  1.  Shower with CHG Soap the night before surgery and the  morning of Surgery.  2.  If you choose to wash your hair, wash your hair first as usual with your  normal  shampoo.  3.  After you shampoo, rinse your hair and body thoroughly to  remove the  shampoo.                           4.  Use CHG as you would any other liquid soap.  You can apply chg directly  to the skin and wash                       Gently with a scrungie or clean washcloth.  5.  Apply the CHG Soap to your body ONLY FROM THE NECK DOWN.   Do not use on face/ open                           Wound or open sores. Avoid contact with eyes, ears mouth and genitals (private parts).                       Wash face,  Genitals (private parts) with your normal soap.             6.  Wash thoroughly, paying special attention to the area where your surgery  will be performed.  7.  Thoroughly  rinse your body with warm water from the neck down.  8.  DO NOT shower/wash with your normal soap after using and rinsing off  the CHG Soap.                9.  Pat yourself dry with a clean towel.            10.  Wear clean pajamas.            11.  Place clean sheets on your bed the night of your first shower and do not  sleep with pets. Day of Surgery : Do not apply any lotions/deodorants the morning of surgery.  Please wear clean clothes to the hospital/surgery center.  FAILURE TO FOLLOW THESE INSTRUCTIONS MAY RESULT IN THE CANCELLATION OF YOUR SURGERY PATIENT SIGNATURE_________________________________  NURSE SIGNATURE__________________________________  ________________________________________________________________________

## 2022-08-15 ENCOUNTER — Encounter (HOSPITAL_COMMUNITY)
Admission: RE | Admit: 2022-08-15 | Discharge: 2022-08-15 | Disposition: A | Discharge status: Home / Self Care | Payer: PPO | Source: Ambulatory Visit | Attending: Surgery | Admitting: Surgery

## 2022-08-15 ENCOUNTER — Other Ambulatory Visit: Payer: Self-pay

## 2022-08-15 ENCOUNTER — Encounter (HOSPITAL_COMMUNITY): Payer: Self-pay

## 2022-08-15 VITALS — BP 164/82 | HR 75 | Temp 98.2°F | Resp 16 | Ht 64.0 in | Wt 196.0 lb

## 2022-08-15 DIAGNOSIS — Z01818 Encounter for other preprocedural examination: Secondary | ICD-10-CM

## 2022-08-15 DIAGNOSIS — M109 Gout, unspecified: Secondary | ICD-10-CM | POA: Diagnosis not present

## 2022-08-15 DIAGNOSIS — Z01812 Encounter for preprocedural laboratory examination: Secondary | ICD-10-CM | POA: Insufficient documentation

## 2022-08-15 LAB — CBC
HCT: 46.9 % — ABNORMAL HIGH (ref 36.0–46.0)
Hemoglobin: 15.5 g/dL — ABNORMAL HIGH (ref 12.0–15.0)
MCH: 32.2 pg (ref 26.0–34.0)
MCHC: 33 g/dL (ref 30.0–36.0)
MCV: 97.5 fL (ref 80.0–100.0)
Platelets: 234 10*3/uL (ref 150–400)
RBC: 4.81 MIL/uL (ref 3.87–5.11)
RDW: 12.5 % (ref 11.5–15.5)
WBC: 8.9 10*3/uL (ref 4.0–10.5)
nRBC: 0 % (ref 0.0–0.2)

## 2022-08-15 LAB — BASIC METABOLIC PANEL
Anion gap: 5 (ref 5–15)
BUN: 18 mg/dL (ref 8–23)
CO2: 28 mmol/L (ref 22–32)
Calcium: 10 mg/dL (ref 8.9–10.3)
Chloride: 105 mmol/L (ref 98–111)
Creatinine, Ser: 0.65 mg/dL (ref 0.44–1.00)
GFR, Estimated: >60 mL/min (ref >60)
Glucose, Bld: 95 mg/dL (ref 70–99)
Potassium: 4.8 mmol/L (ref 3.5–5.1)
Sodium: 138 mmol/L (ref 135–145)

## 2022-08-19 NOTE — H&P (Signed)
REFERRING PHYSICIAN: Leonides Cave, MD  PROVIDER: Shalamar Crays Charlotta Newton, MD   Chief Complaint: New Consultation (Hyperparathyroidism )  History of Present Illness:  Patient is referred by Dr. Leonides Cave for surgical evaluation and recommendations regarding an elevated intact PTH level and suspicion of primary hyperparathyroidism. Patient has developed complications including osteopenia and bone and joint discomfort. She denies fatigue. She denies any history of nephrolithiasis. Recent laboratory studies show a calcium level of 10.1, and elevated intact PTH level of 124, a normal vitamin D level, and a 24-hour urine collection for calcium of 298. Patient had been previously evaluated by Dr. Fredirick Maudlin at [facility]. At that time a decision was made to continue observation and not pursue surgical intervention. Patient has not had any recent imaging studies. She has no prior history of head or neck surgery. There is no family history of parathyroid disease or other endocrine neoplasm.  Review of Systems: A complete review of systems was obtained from the patient. I have reviewed this information and discussed as appropriate with the patient. See HPI as well for other ROS.  Review of Systems Constitutional: Negative. Negative for malaise/fatigue. HENT: Negative. Eyes: Negative. Respiratory: Negative. Cardiovascular: Negative. Gastrointestinal: Negative. Genitourinary: Negative. Musculoskeletal: Positive for back pain and joint pain. Skin: Negative. Neurological: Negative. Endo/Heme/Allergies: Negative. Psychiatric/Behavioral: Negative.  Medical History: Past Medical History: Diagnosis Date Allergy Cataract bilateral Diverticulosis of colon Fatty liver GERD (gastroesophageal reflux disease) Gout Headache, cluster HLD (hyperlipidemia) HTN (hypertension) Hypothyroidism Macular degeneration Osteoarthritis Rapid heartbeat,  unspecified occasional, doesn't last long Tobacco abuse  Patient Active Problem List Diagnosis Hyperparathyroidism (CMS-HCC) Osteopenia of neck of left femur  Past Surgical History: Procedure Laterality Date CHOLECYSTECTOMY 03/17/2014 CATARACT EXTRACTION COLONOSCOPY removed polyps SIGMOIDOSCOPY FLEXIBLE   Allergies Allergen Reactions Ace Inhibitors Other (See Comments) REACTION: cough Atorvastatin Muscle Pain myalgias Clarithromycin Other (See Comments) REACTION: GI upset Fenofibrate Itching REACTION: itching- pt unsure Niacin Other (See Comments) "burning", made pt pass out Penicillins Other (See Comments) REACTION: Purpura Sulfasalazine Other (See Comments) REACTION: Purpura Tetracycline Hcl Nausea REACTION: GI upset  Current Outpatient Medications on File Prior to Visit Medication Sig Dispense Refill aflibercept (EYLEA) 2 mg/0.05 mL injection Place 2 mg into the left eye every 30 (thirty) days. cholecalciferol (VITAMIN D3) 1000 unit tablet Take 1,000 Units by mouth 2 (two) times a day. colchicine (COLCRYS) 0.6 mg tablet Take 1 tablet by mouth 2 (two) times daily as needed docosahexaenoic acid-epa 120-180 mg Cap Take 2,000 mg by mouth daily. folic acid/multivit-min/lutein (CENTRUM SILVER ORAL) Take 1 tablet by mouth once daily gabapentin (NEURONTIN) 100 MG capsule 1 po qHS 30 capsule 5 levothyroxine (SYNTHROID) 200 MCG tablet TAKE 1 TABLET BY MOUTH ONCE DAILY BEFORE BREAKFAST levothyroxine (SYNTHROID) 25 MCG tablet Take 1 tablet (25 mcg total) by mouth daily before breakfast. On Monday through Fridays. Skip on Saturday and Sunday. metoprolol succinate (TOPROL-XL) 200 MG XL tablet Take 1 tablet (200 mg total) by mouth every morning. naproxen sodium (ALEVE) 220 MG tablet Take 220 mg by mouth in the morning and at bedtime. omega 3-dha-epa-fish oil 1,000 mg (120 mg-180 mg) Cap Take by mouth once daily pravastatin (PRAVACHOL) 40 MG tablet Take 40 mg by mouth once  daily psyllium (METAMUCIL) 0.52 gram capsule Take 1 capsule by mouth once daily TURMERIC ORAL Take 750 mg by mouth zinc 50 mg Tab Take by mouth once daily  No current facility-administered medications on file prior to visit.  Family History Problem Relation Age of  Onset Angina Mother Ulcers Father High blood pressure (Hypertension) Brother Prostate cancer Brother   Social History  Tobacco Use Smoking Status Former Smokeless Tobacco Never Tobacco Comments 07/04/2010   Social History  Socioeconomic History Marital status: Married Tobacco Use Smoking status: Former Smokeless tobacco: Never Tobacco comments: 07/04/2010 Substance and Sexual Activity Alcohol use: Yes Alcohol/week: 1.0 standard drink Types: 1 Glasses of wine per week Drug use: Never  Objective:  Vitals: BP: (!) 152/72 Pulse: 92 Temp: 36.6 C (97.8 F) SpO2: 96% Weight: 98.6 kg (217 lb 6.4 oz) Height: 162.6 cm (5' 4"$ )  Body mass index is 37.32 kg/m.  Physical Exam  GENERAL APPEARANCE Comfortable, no acute issues Development: normal Gross deformities: none  SKIN Rash, lesions, ulcers: none Induration, erythema: none Nodules: none palpable  EYES Conjunctiva and lids: normal Pupils: equal and reactive  EARS, NOSE, MOUTH, THROAT External ears: no lesion or deformity External nose: no lesion or deformity Hearing: grossly normal  NECK Symmetric: yes Trachea: midline Thyroid: no palpable nodules in the thyroid bed  CHEST Respiratory effort: normal Retraction or accessory muscle use: no Breath sounds: normal bilaterally Rales, rhonchi, wheeze: none  CARDIOVASCULAR Auscultation: regular rhythm, normal rate Murmurs: none Pulses: radial pulse 2+ palpable Lower extremity edema: none  ABDOMEN Not assessed  GENITOURINARY/RECTAL Not assessed  MUSCULOSKELETAL Station and gait: normal Digits and nails: no clubbing or cyanosis Muscle strength: grossly normal all extremities Range  of motion: grossly normal all extremities Deformity: none  LYMPHATIC Cervical: none palpable Supraclavicular: none palpable  PSYCHIATRIC Oriented to person, place, and time: yes Mood and affect: normal for situation Judgment and insight: appropriate for situation   Assessment and Plan:  Hyperparathyroidism (CMS-HCC) Osteopenia of neck of left femur  Patient is referred by her primary care physician, Dr. Leonides Cave, for surgical evaluation and recommendations regarding an elevated intact PTH level and probable primary hyperparathyroidism.  Patient provided with a copy of "Parathyroid Surgery: Treatment for Your Parathyroid Gland Problem", published by [employer], 12 pages. Book reviewed and explained to patient during visit today.  Patient has biochemical evidence of primary hyperparathyroidism. I would like to obtain imaging studies to include an ultrasound and a nuclear medicine parathyroid scan with sestamibi to better evaluate for possible primary hyperparathyroidism. Today we discussed the studies. If they demonstrate evidence of a parathyroid adenoma, then I believe the patient will be a good candidate for minimally invasive outpatient parathyroid surgery. We discussed surgery today briefly and I provided her with written literature on parathyroid surgery to review at home. If the studies failed to localize an adenoma and confirm her diagnosis, then we will obtain a 4D CT scan of the neck in hopes of confirming her diagnosis. Patient understands and agrees to proceed.  Patient will undergo the above studies. I will contact her with the results when they are available on MyChart. We will make a decision at that time regarding further management.  Armandina Gemma, MD [facility] [place] practice Office: [phone]

## 2022-08-20 ENCOUNTER — Encounter (HOSPITAL_COMMUNITY): Payer: Self-pay | Admitting: Surgery

## 2022-08-20 ENCOUNTER — Ambulatory Visit (HOSPITAL_COMMUNITY): Payer: PPO | Admitting: Physician Assistant

## 2022-08-20 ENCOUNTER — Ambulatory Visit (HOSPITAL_COMMUNITY)
Admission: RE | Admit: 2022-08-20 | Discharge: 2022-08-20 | Disposition: A | Discharge status: Home / Self Care | Payer: PPO | Attending: Surgery | Admitting: Surgery

## 2022-08-20 ENCOUNTER — Encounter (HOSPITAL_COMMUNITY)
Admission: RE | Disposition: A | Discharge status: Home / Self Care | Payer: Self-pay | Source: Home / Self Care | Attending: Surgery

## 2022-08-20 ENCOUNTER — Other Ambulatory Visit: Payer: Self-pay

## 2022-08-20 ENCOUNTER — Ambulatory Visit (HOSPITAL_BASED_OUTPATIENT_CLINIC_OR_DEPARTMENT_OTHER): Payer: PPO | Admitting: Anesthesiology

## 2022-08-20 DIAGNOSIS — E213 Hyperparathyroidism, unspecified: Secondary | ICD-10-CM | POA: Diagnosis not present

## 2022-08-20 DIAGNOSIS — I10 Essential (primary) hypertension: Secondary | ICD-10-CM | POA: Insufficient documentation

## 2022-08-20 DIAGNOSIS — Z87891 Personal history of nicotine dependence: Secondary | ICD-10-CM | POA: Insufficient documentation

## 2022-08-20 DIAGNOSIS — D351 Benign neoplasm of parathyroid gland: Secondary | ICD-10-CM | POA: Insufficient documentation

## 2022-08-20 DIAGNOSIS — Z79899 Other long term (current) drug therapy: Secondary | ICD-10-CM | POA: Insufficient documentation

## 2022-08-20 DIAGNOSIS — M1990 Unspecified osteoarthritis, unspecified site: Secondary | ICD-10-CM | POA: Diagnosis not present

## 2022-08-20 DIAGNOSIS — Z01818 Encounter for other preprocedural examination: Secondary | ICD-10-CM

## 2022-08-20 DIAGNOSIS — M8580 Other specified disorders of bone density and structure, unspecified site: Secondary | ICD-10-CM | POA: Diagnosis not present

## 2022-08-20 DIAGNOSIS — E210 Primary hyperparathyroidism: Secondary | ICD-10-CM | POA: Diagnosis not present

## 2022-08-20 HISTORY — PX: PARATHYROIDECTOMY: SHX19

## 2022-08-20 SURGERY — PARATHYROIDECTOMY
Anesthesia: General | Laterality: Left

## 2022-08-20 MED ORDER — SUGAMMADEX SODIUM 200 MG/2ML IV SOLN
INTRAVENOUS | Status: DC | PRN
  Administered 2022-08-20: 200 mg via INTRAVENOUS

## 2022-08-20 MED ORDER — ONDANSETRON HCL 4 MG/2ML IJ SOLN
INTRAMUSCULAR | Status: DC | PRN
  Administered 2022-08-20: 4 mg via INTRAVENOUS

## 2022-08-20 MED ORDER — PHENYLEPHRINE 80 MCG/ML (10ML) SYRINGE FOR IV PUSH (FOR BLOOD PRESSURE SUPPORT)
PREFILLED_SYRINGE | INTRAVENOUS | Status: DC | PRN
  Administered 2022-08-20: 160 ug via INTRAVENOUS
  Administered 2022-08-20: 80 ug via INTRAVENOUS

## 2022-08-20 MED ORDER — OXYCODONE HCL 5 MG/5ML PO SOLN: 5.0000 mg | Freq: Once | ORAL | Status: DC | PRN

## 2022-08-20 MED ORDER — ACETAMINOPHEN 500 MG PO TABS
1000.0000 mg | ORAL_TABLET | Freq: Once | ORAL | Status: AC
  Administered 2022-08-20: 1000 mg via ORAL
  Filled 2022-08-20: qty 2

## 2022-08-20 MED ORDER — ORAL CARE MOUTH RINSE: 15.0000 mL | Freq: Once | OROMUCOSAL | Status: AC

## 2022-08-20 MED ORDER — LACTATED RINGERS IV SOLN: INTRAVENOUS | Status: DC

## 2022-08-20 MED ORDER — ROCURONIUM BROMIDE 10 MG/ML (PF) SYRINGE
PREFILLED_SYRINGE | INTRAVENOUS | Status: AC
  Filled 2022-08-20: qty 10

## 2022-08-20 MED ORDER — PROPOFOL 10 MG/ML IV BOLUS
INTRAVENOUS | Status: DC | PRN
  Administered 2022-08-20: 140 mg via INTRAVENOUS

## 2022-08-20 MED ORDER — HEMOSTATIC AGENTS (NO CHARGE) OPTIME
TOPICAL | Status: DC | PRN
  Administered 2022-08-20: 1 via TOPICAL

## 2022-08-20 MED ORDER — CIPROFLOXACIN IN D5W 400 MG/200ML IV SOLN
400.0000 mg | INTRAVENOUS | Status: AC
  Administered 2022-08-20: 400 mg via INTRAVENOUS
  Filled 2022-08-20: qty 200

## 2022-08-20 MED ORDER — ROCURONIUM BROMIDE 10 MG/ML (PF) SYRINGE
PREFILLED_SYRINGE | INTRAVENOUS | Status: DC | PRN
  Administered 2022-08-20: 60 mg via INTRAVENOUS

## 2022-08-20 MED ORDER — LIDOCAINE 2% (20 MG/ML) 5 ML SYRINGE
INTRAMUSCULAR | Status: DC | PRN
  Administered 2022-08-20: 100 mg via INTRAVENOUS

## 2022-08-20 MED ORDER — LIDOCAINE HCL (PF) 2 % IJ SOLN
INTRAMUSCULAR | Status: AC
  Filled 2022-08-20: qty 5

## 2022-08-20 MED ORDER — BUPIVACAINE HCL (PF) 0.25 % IJ SOLN
INTRAMUSCULAR | Status: AC
  Filled 2022-08-20: qty 30

## 2022-08-20 MED ORDER — CHLORHEXIDINE GLUCONATE CLOTH 2 % EX PADS: 6.0000 | MEDICATED_PAD | Freq: Once | CUTANEOUS | Status: DC

## 2022-08-20 MED ORDER — FENTANYL CITRATE PF 50 MCG/ML IJ SOSY: 25.0000 ug | PREFILLED_SYRINGE | INTRAMUSCULAR | Status: DC | PRN

## 2022-08-20 MED ORDER — BUPIVACAINE HCL 0.25 % IJ SOLN
INTRAMUSCULAR | Status: DC | PRN
  Administered 2022-08-20: 10 mL

## 2022-08-20 MED ORDER — DEXAMETHASONE SODIUM PHOSPHATE 10 MG/ML IJ SOLN
INTRAMUSCULAR | Status: DC | PRN
  Administered 2022-08-20: 10 mg via INTRAVENOUS

## 2022-08-20 MED ORDER — CHLORHEXIDINE GLUCONATE 0.12 % MT SOLN
15.0000 mL | Freq: Once | OROMUCOSAL | Status: AC
  Administered 2022-08-20: 15 mL via OROMUCOSAL

## 2022-08-20 MED ORDER — DEXAMETHASONE SODIUM PHOSPHATE 10 MG/ML IJ SOLN
INTRAMUSCULAR | Status: AC
  Filled 2022-08-20: qty 1

## 2022-08-20 MED ORDER — OXYCODONE HCL 5 MG PO TABS: 5.0000 mg | ORAL_TABLET | Freq: Once | ORAL | Status: DC | PRN

## 2022-08-20 MED ORDER — PROPOFOL 10 MG/ML IV BOLUS
INTRAVENOUS | Status: AC
  Filled 2022-08-20: qty 20

## 2022-08-20 MED ORDER — FENTANYL CITRATE (PF) 100 MCG/2ML IJ SOLN
INTRAMUSCULAR | Status: AC
  Filled 2022-08-20: qty 2

## 2022-08-20 MED ORDER — FENTANYL CITRATE (PF) 100 MCG/2ML IJ SOLN
INTRAMUSCULAR | Status: DC | PRN
  Administered 2022-08-20: 100 ug via INTRAVENOUS

## 2022-08-20 MED ORDER — ONDANSETRON HCL 4 MG/2ML IJ SOLN
INTRAMUSCULAR | Status: AC
  Filled 2022-08-20: qty 2

## 2022-08-20 MED ORDER — TRAMADOL HCL 50 MG PO TABS: 50.0000 mg | ORAL_TABLET | Freq: Four times a day (QID) | ORAL | 0 refills | Status: DC | PRN

## 2022-08-20 MED ORDER — 0.9 % SODIUM CHLORIDE (POUR BTL) OPTIME
TOPICAL | Status: DC | PRN
  Administered 2022-08-20: 1000 mL

## 2022-08-20 SURGICAL SUPPLY — 35 items

## 2022-08-20 NOTE — Anesthesia Preprocedure Evaluation (Addendum)
Anesthesia Evaluation  Patient identified by MRN, date of birth, ID band Patient awake    Reviewed: Allergy & Precautions, NPO status , Patient's Chart, lab work & pertinent test results, reviewed documented beta blocker date and time   History of Anesthesia Complications Negative for: history of anesthetic complications  Airway Mallampati: II  TM Distance: >3 FB Neck ROM: Full    Dental no notable dental hx.    Pulmonary former smoker   Pulmonary exam normal        Cardiovascular hypertension, Pt. on medications and Pt. on home beta blockers Normal cardiovascular exam     Neuro/Psych negative neurological ROS     GI/Hepatic negative GI ROS, Neg liver ROS,,,  Endo/Other  Hypothyroidism  HYPERPARATHYROIDISM  Renal/GU negative Renal ROS  negative genitourinary   Musculoskeletal  (+) Arthritis ,    Abdominal   Peds  Hematology negative hematology ROS (+)   Anesthesia Other Findings Day of surgery medications reviewed with patient.  Reproductive/Obstetrics negative OB ROS                             Anesthesia Physical Anesthesia Plan  ASA: 2  Anesthesia Plan: General   Post-op Pain Management: Tylenol PO (pre-op)*   Induction: Intravenous  PONV Risk Score and Plan: 3 and Treatment may vary due to age or medical condition, Dexamethasone and Ondansetron  Airway Management Planned: Oral ETT  Additional Equipment: None  Intra-op Plan:   Post-operative Plan: Extubation in OR  Informed Consent: I have reviewed the patients History and Physical, chart, labs and discussed the procedure including the risks, benefits and alternatives for the proposed anesthesia with the patient or authorized representative who has indicated his/her understanding and acceptance.     Dental advisory given  Plan Discussed with: CRNA  Anesthesia Plan Comments:        Anesthesia Quick  Evaluation

## 2022-08-20 NOTE — Op Note (Signed)
OPERATIVE REPORT - PARATHYROIDECTOMY  Preoperative diagnosis: Primary hyperparathyroidism  Postop diagnosis: Same  Procedure: Left minimally invasive parathyroidectomy  Surgeon:  Armandina Gemma, MD  Anesthesia: General endotracheal  Estimated blood loss: Minimal  Preparation: ChloraPrep  Indications: Patient is referred by Dr. Leonides Cave for surgical evaluation and recommendations regarding an elevated intact PTH level and suspicion of primary hyperparathyroidism. Patient has developed complications including osteopenia and bone and joint discomfort. She denies fatigue. She denies any history of nephrolithiasis. Recent laboratory studies show a calcium level of 10.1, and elevated intact PTH level of 124, a normal vitamin D level, and a 24-hour urine collection for calcium of 298. Patient had been previously evaluated by Dr. Fredirick Maudlin at [facility]. At that time a decision was made to continue observation and not pursue surgical intervention. Patient has not had any recent imaging studies. She has no prior history of head or neck surgery. There is no family history of parathyroid disease or other endocrine neoplasm. Nuclear med parathyroid scan suggested a left adenoma.  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  Patient now comes to surgery for parathyroidectomy.  Procedure: The patient was prepared in the pre-operative holding area. The patient was brought to the operating room and placed in a supine position on the operating room table. Following administration of general anesthesia, the patient was positioned and then prepped and draped in the usual strict aseptic fashion. After ascertaining that an adequate level of anesthesia been achieved, a neck incision was made with a #15 blade. Dissection was carried through subcutaneous tissues and platysma. Hemostasis was obtained with the electrocautery. Skin flaps were developed circumferentially and a Weitlander  retractor was placed for exposure.  Strap muscles were incised in the midline. Strap muscles were reflected laterally exposing an atrophic thyroid lobe. With gentle blunt dissection the thyroid lobe was mobilized.  Dissection was carried posteriorly and an enlarged parathyroid gland was identified. It was gently mobilized. Vascular structures were divided between small ligaclips. Care was taken to avoid the recurrent laryngeal nerve. The parathyroid gland was completely excised. It was submitted to pathology where frozen section confirmed hypercellular parathyroid tissue consistent with adenoma.  Neck was irrigated with warm saline and good hemostasis was noted. Fibrillar was placed in the operative field. Strap muscles were approximated in the midline with interrupted 3-0 Vicryl sutures. Platysma was closed with interrupted 3-0 Vicryl sutures. Marcaine was infiltrated circumferentially. Skin was closed with a running 4-0 Monocryl subcuticular suture. Wound was washed and dried and Dermabond was applied. Patient was awakened from anesthesia and brought to the recovery room. The patient tolerated the procedure well.   TArmandina Gemma MDestinSurgery Office: [phone]

## 2022-08-20 NOTE — Anesthesia Postprocedure Evaluation (Signed)
Anesthesia Post Note  Patient: Mercedes [name]  Procedure(s) Performed: LEFT PARATHYROIDECTOMY (Left)     Patient location during evaluation: PACU Anesthesia Type: General Level of consciousness: awake and alert Pain management: pain level controlled Vital Signs Assessment: post-procedure vital signs reviewed and stable Respiratory status: spontaneous breathing, nonlabored ventilation and respiratory function stable Cardiovascular status: blood pressure returned to baseline Postop Assessment: no apparent nausea or vomiting Anesthetic complications: no   No notable events documented.  Last Vitals:  Vitals:   08/20/22 1330 08/20/22 1345  BP: (!) 152/62 (!) 178/78  Pulse: 81   Resp: 16   Temp:    SpO2: 97%     Last Pain:  Vitals:   08/20/22 1345  TempSrc:   PainSc: 0-No pain                 Marthenia Rolling

## 2022-08-20 NOTE — Transfer of Care (Signed)
Immediate Anesthesia Transfer of Care Note  Patient: Mercedes [name]  Procedure(s) Performed: LEFT PARATHYROIDECTOMY (Left)  Patient Location: PACU  Anesthesia Type:General  Level of Consciousness: sedated  Airway & Oxygen Therapy: Patient Spontanous Breathing and Patient connected to face mask oxygen  Post-op Assessment: Report given to RN and Post -op Vital signs reviewed and stable  Post vital signs: Reviewed and stable  Last Vitals:  Vitals Value Taken Time  BP    Temp    Pulse 84 08/20/22 1302  Resp    SpO2 100 % 08/20/22 1302  Vitals shown include unvalidated device data.  Last Pain:  Vitals:   08/20/22 1047  TempSrc:   PainSc: 0-No pain         Complications: No notable events documented.

## 2022-08-20 NOTE — Interval H&P Note (Signed)
History and Physical Interval Note:  08/20/2022 11:46 AM  Mercedes [name]  has presented today for surgery, with the diagnosis of HYPERPARATHYROIDISM.  The various methods of treatment have been discussed with the patient and family. After consideration of risks, benefits and other options for treatment, the patient has consented to    Procedure(s): LEFT PARATHYROIDECTOMY (Left) as a surgical intervention.    The patient's history has been reviewed, patient examined, no change in status, stable for surgery.  I have reviewed the patient's chart and labs.  Questions were answered to the patient's satisfaction.    Armandina Gemma, [facility] [place] [facility] Office: [place]

## 2022-08-20 NOTE — Anesthesia Procedure Notes (Signed)
Procedure Name: Intubation Date/Time: 08/20/2022 12:10 PM  Performed by: Lind Covert, CRNAPre-anesthesia Checklist: Patient identified, Emergency Drugs available, Suction available, Patient being monitored and Timeout performed Patient Re-evaluated:Patient Re-evaluated prior to induction Oxygen Delivery Method: Circle system utilized Preoxygenation: Pre-oxygenation with 100% oxygen Induction Type: IV induction Ventilation: Mask ventilation without difficulty Laryngoscope Size: Mac and 3 Grade View: Grade II Tube type: Oral Tube size: 7.0 mm Number of attempts: 1 Airway Equipment and Method: Stylet Placement Confirmation: ETT inserted through vocal cords under direct vision, breath sounds checked- equal and bilateral and positive ETCO2 Secured at: 22 cm Tube secured with: Tape Dental Injury: Teeth and Oropharynx as per pre-operative assessment

## 2022-08-20 NOTE — Discharge Instructions (Addendum)
[facility] - Dr. Todd Gerkin  THYROID & PARATHYROID SURGERY:  POST-OP INSTRUCTIONS  Always review the instruction sheet provided by the hospital nurse at discharge.  A prescription for pain medication may be sent to your pharmacy at the time of discharge.  Take your pain medication as prescribed.  If narcotic pain medicine is not needed, then you may take acetaminophen (Tylenol) or ibuprofen (Advil) as needed for pain or soreness.  Take your normal home medications as prescribed unless otherwise directed.  If you need a refill on your pain medication, please contact the office during regular business hours.  Prescriptions will not be processed by the office after 5:00PM or on weekends.  Start with a light diet upon arrival home, such as soup and crackers or toast.  Be sure to drink plenty of fluids.  Resume your normal diet the day after surgery.  Most patients will experience some swelling and bruising on the chest and neck area.  Ice packs will help for the first 48 hours after arriving home.  Swelling and bruising will take several days to resolve.   It is common to experience some constipation after surgery.  Increasing fluid intake and taking a stool softener (Colace) will usually help to prevent this problem.  A mild laxative (Milk of Magnesia or Miralax) should be taken according to package directions if there has been no bowel movement after 48 hours.  Dermabond glue covers your incision. This seals the wound and you may shower at any time. The Dermabond will remain in place for about a week.  You may gradually remove the glue when it loosens around the edges.  If you need to loosen the Dermabond for removal, apply a layer of Vaseline to the wound for 15 minutes and then remove with a Kleenex. Your sutures are under the skin and will not show - they will dissolve on their own.  You may resume light daily activities beginning the day after discharge (such as self-care,  walking, climbing stairs), gradually increasing activities as tolerated. You may have sexual intercourse when it is comfortable. Refrain from any heavy lifting or straining until approved by your doctor. You may drive when you no longer are taking prescription pain medication, you can comfortably wear a seatbelt, and you can safely maneuver your car and apply the brakes.  You will see your doctor in the office for a follow-up appointment approximately three weeks after your surgery.  Make sure that you call for this appointment within a day or two after you arrive home to insure a convenient appointment time. Please have any requested laboratory tests performed a few days prior to your office visit so that the results will be available at your follow up appointment.  WHEN TO CALL THE [facility] OFFICE: -- Fever greater than 101.5 -- Inability to urinate -- Nausea and/or vomiting - persistent -- Extreme swelling or bruising -- Continued bleeding from incision -- Increased pain, redness, or drainage from the incision -- Difficulty swallowing or breathing -- Muscle cramping or spasms -- Numbness or tingling in hands or around lips  The clinic staff is available to answer your questions during regular business hours.  Please don't hesitate to call and ask to speak to one of the nurses if you have concerns.  [facility] OFFICE: [phone] (24 hours)  Please sign up for MyChart accounts. This will allow you to communicate directly with my nurse or myself without having to call the office. It will also allow you   to view your test results. You will need to enroll in MyChart for my office ([facility]) and for the hospital ([facility]).  Todd Gerkin, MD [facility] practice 

## 2022-08-21 ENCOUNTER — Encounter (HOSPITAL_COMMUNITY): Payer: Self-pay | Admitting: Surgery

## 2022-08-21 LAB — SURGICAL PATHOLOGY

## 2022-08-29 DIAGNOSIS — E892 Postprocedural hypoparathyroidism: Secondary | ICD-10-CM | POA: Diagnosis not present

## 2022-08-29 DIAGNOSIS — E213 Hyperparathyroidism, unspecified: Secondary | ICD-10-CM | POA: Diagnosis not present

## 2022-09-04 DIAGNOSIS — Z961 Presence of intraocular lens: Secondary | ICD-10-CM | POA: Diagnosis not present

## 2022-09-04 DIAGNOSIS — H353112 Nonexudative age-related macular degeneration, right eye, intermediate dry stage: Secondary | ICD-10-CM | POA: Diagnosis not present

## 2022-09-04 DIAGNOSIS — H353221 Exudative age-related macular degeneration, left eye, with active choroidal neovascularization: Secondary | ICD-10-CM | POA: Diagnosis not present

## 2022-09-18 DIAGNOSIS — M4316 Spondylolisthesis, lumbar region: Secondary | ICD-10-CM | POA: Diagnosis not present

## 2022-09-18 DIAGNOSIS — M48062 Spinal stenosis, lumbar region with neurogenic claudication: Secondary | ICD-10-CM | POA: Diagnosis not present

## 2022-10-15 ENCOUNTER — Telehealth: Payer: Self-pay | Admitting: Obstetrics and Gynecology

## 2022-10-15 ENCOUNTER — Other Ambulatory Visit (HOSPITAL_COMMUNITY)
Admission: RE | Admit: 2022-10-15 | Discharge: 2022-10-15 | Disposition: A | Discharge status: Home / Self Care | Payer: PPO | Source: Ambulatory Visit | Attending: Obstetrics and Gynecology | Admitting: Obstetrics and Gynecology

## 2022-10-15 ENCOUNTER — Ambulatory Visit (INDEPENDENT_AMBULATORY_CARE_PROVIDER_SITE_OTHER): Payer: PPO | Admitting: Obstetrics and Gynecology

## 2022-10-15 ENCOUNTER — Encounter: Payer: Self-pay | Admitting: Obstetrics and Gynecology

## 2022-10-15 VITALS — BP 140/82 | HR 78

## 2022-10-15 DIAGNOSIS — N952 Postmenopausal atrophic vaginitis: Secondary | ICD-10-CM | POA: Diagnosis not present

## 2022-10-15 DIAGNOSIS — R82998 Other abnormal findings in urine: Secondary | ICD-10-CM | POA: Diagnosis not present

## 2022-10-15 DIAGNOSIS — R350 Frequency of micturition: Secondary | ICD-10-CM

## 2022-10-15 LAB — POCT URINALYSIS DIPSTICK
Bilirubin, UA: NEGATIVE
Glucose, UA: NEGATIVE
Ketones, UA: NEGATIVE
Nitrite, UA: NEGATIVE
Protein, UA: POSITIVE — AB
Spec Grav, UA: >=1.03 — AB (ref 1.010–1.025)
Urobilinogen, UA: 0.2 E.U./dL
pH, UA: 5.5 (ref 5.0–8.0)

## 2022-10-15 MED ORDER — TROSPIUM CHLORIDE ER 60 MG PO CP24: 60.0000 mg | ORAL_CAPSULE | Freq: Every day | ORAL | 5 refills | Status: DC

## 2022-10-15 MED ORDER — ESTRADIOL 7.5 MCG/24HR VA RING: 1.0000 | VAGINAL_RING | VAGINAL | 4 refills | Status: DC

## 2022-10-15 NOTE — Progress Notes (Addendum)
[facility] Return Visit  SUBJECTIVE  History of Present Illness: Mercedes [name] is a 77 y.o. female seen in follow-up for UTI and vaginal dryness.  Reports the estrogen cream is not providing adequate relief and her [name] is not helping her urinary frequency as she is still up every 2 hours at night.     Past Medical History: Patient  has a past medical history of Allergy, Cataract, Diverticulosis of colon, Fatty liver, Gout, HLD (hyperlipidemia), HTN (hypertension), Hypothyroidism, Macular degeneration, Osteoarthritis, Rapid heartbeat, and Tobacco abuse.   Past Surgical History: She  has a past surgical history that includes Sigmoidoscopy; Colonoscopy; Cholecystectomy (N/A, 03/17/2014); Cataract extraction; lower back surgery ; and Parathyroidectomy (Left, 08/20/2022).   Medications: She has a current medication list which includes the following prescription(s): ascorbic acid, cholecalciferol, colchicine, cranberry, estradiol, vabysmo, levothyroxine, levothyroxine, metoprolol, multivitamin with minerals, preservision areds 2, omega 3, potassium, pravastatin, psyllium, trospium chloride, turmeric, and zinc gluconate.   Allergies: Patient is allergic to ace inhibitors, clarithromycin, fenofibrate, lipitor [atorvastatin], niacin, penicillins, sulfasalazine, and tetracycline hcl.   Social History: Patient  reports that she quit smoking about 12 years ago. Her smoking use included cigarettes. She has a 3.75 pack-year smoking history. She has never used smokeless tobacco. She reports current alcohol use. She reports that she does not use drugs.      OBJECTIVE    POC Urine: Positive for Blood, Leukocytes, and Protein  Physical Exam: Vitals:   10/15/22 1410 10/15/22 1413  BP: (!) 143/78 (!) 140/82  Pulse: 79 78   Gen: No apparent distress, A&O x 3.  Detailed Urogynecologic Evaluation: External genitalia normal in appearance. Vaginal atrophy prominent at introitus. She  has redness and irritation around urethra. Vaginal irritation noted on speculum exam in the vagina and on the right side of the vaginal wall. Small bleeding noted, no clots or heavy bleeding noted.     ASSESSMENT AND PLAN    [name] is a 77 y.o. with:  1. Urinary frequency   2. Vaginal atrophy    Patient has been on Gemtesa for Urinary Frequency and reports she is still up every 2 hours at night and has been having irritation. Patient POC urine today positive for blood and leukocytes, will send for culture and treat if UTI. Will also start patient on Trospium 60mg  ER daily.  Patient has vaginal atrophy that is not well controlled with the vaginal estrogen cream (Estrace). She has attempted Yuvafem and Estrace and has had irritation with both and has still had some vaginal bleeding. She would like to try the Vaginal Estrogen ring. Will attempt to get approval for the E-string as this would help more with the deep areas of vaginal atrophy that what patient is currently able to get with the estrogen cream.   Will have patient follow up in 5 weeks for medication follow up.

## 2022-10-15 NOTE — Patient Instructions (Addendum)
D-mannose is something you can get over the counter and add to your daily regimen to prevent UTI's. It creates a non-stick surface for bacteria.   E-string has been sent to pharmacy  Trospium 60mg  ER for overactive bladder    Our third line therapy options include Sacral neuromodulation and bladder botox.

## 2022-10-15 NOTE — Telephone Encounter (Signed)
Patient called in this morning c/o urinary frequency (every 2 hrs), vaginal irritation, and some bleeding (spotting) every now and then.  Not sure if she would need an exam or just nurse visit to give urine.  Please advise.

## 2022-10-15 NOTE — Telephone Encounter (Signed)
I would prefer she come for a visit so I can examine her. The vaginal bleeding is concerning and I would want to see if it is from her atrophy.

## 2022-10-17 ENCOUNTER — Other Ambulatory Visit: Payer: Self-pay | Admitting: Obstetrics and Gynecology

## 2022-10-17 DIAGNOSIS — B9689 Other specified bacterial agents as the cause of diseases classified elsewhere: Secondary | ICD-10-CM

## 2022-10-17 LAB — URINE CULTURE: Culture: 60000 — AB

## 2022-10-17 MED ORDER — SULFAMETHOXAZOLE-TRIMETHOPRIM 800-160 MG PO TABS: 1.0000 | ORAL_TABLET | Freq: Two times a day (BID) | ORAL | 0 refills | Status: AC

## 2022-10-17 NOTE — Progress Notes (Signed)
Positive urine culture, Bactrim DS 800-160 mg sent to pharmacy.

## 2022-10-22 NOTE — Progress Notes (Signed)
[employer] contacted for a tier reduction submission. Request ID: [id number] There will be a 24-72hr turn around for a response.

## 2022-10-24 ENCOUNTER — Other Ambulatory Visit: Payer: Self-pay | Admitting: Family

## 2022-10-24 ENCOUNTER — Telehealth: Payer: Self-pay | Admitting: Family Medicine

## 2022-10-24 DIAGNOSIS — M7989 Other specified soft tissue disorders: Secondary | ICD-10-CM

## 2022-10-24 NOTE — Telephone Encounter (Signed)
Prescription Request  10/24/2022  LOV: 08/02/2022  What is the name of the medication or equipment? colchicine 0.6 MG tablet   Have you contacted your pharmacy to request a refill? No   Which pharmacy would you like this sent to?  [facility] [address] [place] - [address] [place] [address] Phone: [phone] Fax: [phone]    Patient notified that their request is being sent to the clinical staff for review and that they should receive a response within 2 business days.   Please advise at Mobile [phone] (mobile)

## 2022-10-24 NOTE — Telephone Encounter (Signed)
Erx has been sent.  

## 2022-10-29 ENCOUNTER — Telehealth: Payer: Self-pay | Admitting: Family Medicine

## 2022-10-29 DIAGNOSIS — H353221 Exudative age-related macular degeneration, left eye, with active choroidal neovascularization: Secondary | ICD-10-CM | POA: Diagnosis not present

## 2022-10-29 NOTE — Telephone Encounter (Signed)
Contacted Mercedes [name] to schedule their annual wellness visit. Appointment made for 12/06/2022.  [name] Care Guide [facility] AWV TEAM Direct Dial: [phone]

## 2022-11-12 NOTE — Progress Notes (Addendum)
PA tier exception has been denied  Message sent to [facility] for an alternative.

## 2022-11-19 ENCOUNTER — Encounter: Payer: Self-pay | Admitting: Obstetrics and Gynecology

## 2022-11-19 NOTE — Addendum Note (Signed)
Addended by: Selmer Dominion on: 11/19/2022 04:32 PM   Modules accepted: Orders

## 2022-11-22 ENCOUNTER — Ambulatory Visit (INDEPENDENT_AMBULATORY_CARE_PROVIDER_SITE_OTHER): Payer: PPO | Admitting: Obstetrics and Gynecology

## 2022-11-22 ENCOUNTER — Encounter: Payer: Self-pay | Admitting: Obstetrics and Gynecology

## 2022-11-22 VITALS — BP 183/82 | HR 79

## 2022-11-22 DIAGNOSIS — R3915 Urgency of urination: Secondary | ICD-10-CM

## 2022-11-22 DIAGNOSIS — R350 Frequency of micturition: Secondary | ICD-10-CM

## 2022-11-22 DIAGNOSIS — R159 Full incontinence of feces: Secondary | ICD-10-CM | POA: Diagnosis not present

## 2022-11-22 NOTE — Progress Notes (Signed)
[facility] Return Visit  SUBJECTIVE  History of Present Illness: Mercedes [name] is a 77 y.o. female seen in follow-up for Estring placement.    Patient reports she has been using the Trospium 60mg  for about a month. She is having some mild relief but not as much as hoped.    Past Medical History: Patient  has a past medical history of Allergy, Cataract, Diverticulosis of colon, Fatty liver, Gout, HLD (hyperlipidemia), HTN (hypertension), Hypothyroidism, Macular degeneration, Osteoarthritis, Rapid heartbeat, and Tobacco abuse.   Past Surgical History: She  has a past surgical history that includes Sigmoidoscopy; Colonoscopy; Cholecystectomy (N/A, 03/17/2014); Cataract extraction; lower back surgery ; and Parathyroidectomy (Left, 08/20/2022).   Medications: She has a current medication list which includes the following prescription(s): ascorbic acid, cholecalciferol, colchicine, cranberry, vabysmo, levothyroxine, levothyroxine, metoprolol, multivitamin with minerals, preservision areds 2, omega 3, potassium, pravastatin, psyllium, pumpkin seed-soy germ, trospium chloride, turmeric, and zinc gluconate.   Allergies: Patient is allergic to ace inhibitors, clarithromycin, fenofibrate, lipitor [atorvastatin], niacin, penicillins, sulfasalazine, and tetracycline hcl.   Social History: Patient  reports that she quit smoking about 12 years ago. Her smoking use included cigarettes. She has a 3.75 pack-year smoking history. She has never used smokeless tobacco. She reports current alcohol use. She reports that she does not use drugs.      OBJECTIVE     Physical Exam: Vitals:   11/22/22 1500  BP: (!) 143/70  Pulse: 79   Gen: No apparent distress, A&O x 3.  Detailed Urogynecologic Evaluation:  Normal vaginal exam. Estring placed today.     ASSESSMENT AND PLAN    Mercedes [name] is a 77 y.o. with:  1. Urinary frequency   2. Urinary urgency   3. Incontinence of feces,  unspecified fecal incontinence type    Patient to continue on Trospium 60mg  for now. We discussed there are other treatments, including bladder botox and SNM that could be beneficial for her moving forward and not to give up hope. Information given on both options for her to look over and will consider this if medication does not show more improvement.  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  May consider SNM in the future.

## 2022-11-22 NOTE — Patient Instructions (Signed)
Continue with Trospium 60mg  ER daily  Consider starting Physical therapy  Keep the estring for 3 months.

## 2022-12-06 ENCOUNTER — Ambulatory Visit (INDEPENDENT_AMBULATORY_CARE_PROVIDER_SITE_OTHER): Payer: PPO

## 2022-12-06 VITALS — Ht 64.0 in | Wt 210.0 lb

## 2022-12-06 DIAGNOSIS — Z0000 Encounter for general adult medical examination without abnormal findings: Secondary | ICD-10-CM

## 2022-12-06 NOTE — Patient Instructions (Signed)
Mercedes [name] , Thank you for taking time to come for your Medicare Wellness Visit. I appreciate your ongoing commitment to your health goals. Please review the following plan we discussed and let me know if I can assist you in the future.   These are the goals we discussed:  Goals      Patient Stated     08/05/2020, I will maintain and continue medications as prescribed.      Patient Stated     Exercise more     Weight < 200 lb (90.719 kg)     Target weight loss is 50 lbs. Starting 01/14/2018, I will continue to drink protein shakes for 2 meals and minimize intake of carbs during dinner. Daily intake of carbs does not exceed 20 grams.          This is a list of the screening recommended for you and due dates:  Health Maintenance  Topic Date Due   Flu Shot  01/31/2023   Mammogram  07/04/2023   Colon Cancer Screening  11/05/2023   Medicare Annual Wellness Visit  12/06/2023   DTaP/Tdap/Td vaccine (4 - Td or Tdap) 07/31/2027   Pneumonia Vaccine  Completed   DEXA scan (bone density measurement)  Completed   COVID-19 Vaccine  Completed   Hepatitis C Screening  Completed   Zoster (Shingles) Vaccine  Completed   HPV Vaccine  Aged Out    Advanced directives: on chart  Conditions/risks identified: Aim for 30 minutes of exercise or brisk walking, 6-8 glasses of water, and 5 servings of fruits and vegetables each day.   Next appointment: Follow up in one year for your annual wellness visit 12/10/23 @ 2:30 televisit   Preventive Care 65 Years and Older, Female Preventive care refers to lifestyle choices and visits with your health care provider that can promote health and wellness. What does preventive care include? A yearly physical exam. This is also called an annual well check. Dental exams once or twice a year. Routine eye exams. Ask your health care provider how often you should have your eyes checked. Personal lifestyle choices, including: Daily care of your teeth and  gums. Regular physical activity. Eating a healthy diet. Avoiding tobacco and drug use. Limiting alcohol use. Practicing safe sex. Taking low-dose aspirin every day. Taking vitamin and mineral supplements as recommended by your health care provider. What happens during an annual well check? The services and screenings done by your health care provider during your annual well check will depend on your age, overall health, lifestyle risk factors, and family history of disease. Counseling  Your health care provider may ask you questions about your: Alcohol use. Tobacco use. Drug use. Emotional well-being. Home and relationship well-being. Sexual activity. Eating habits. History of falls. Memory and ability to understand (cognition). Work and work Astronomer. Reproductive health. Screening  You may have the following tests or measurements: Height, weight, and BMI. Blood pressure. Lipid and cholesterol levels. These may be checked every 5 years, or more frequently if you are over 73 years old. Skin check. Lung cancer screening. You may have this screening every year starting at age 66 if you have a 30-pack-year history of smoking and currently smoke or have quit within the past 15 years. Fecal occult blood test (FOBT) of the stool. You may have this test every year starting at age 8. Flexible sigmoidoscopy or colonoscopy. You may have a sigmoidoscopy every 5 years or a colonoscopy every 10 years starting at age 40. Hepatitis  C blood test. Hepatitis B blood test. Sexually transmitted disease (STD) testing. Diabetes screening. This is done by checking your blood sugar (glucose) after you have not eaten for a while (fasting). You may have this done every 1-3 years. Bone density scan. This is done to screen for osteoporosis. You may have this done starting at age 40. Mammogram. This may be done every 1-2 years. Talk to your health care provider about how often you should have regular  mammograms. Talk with your health care provider about your test results, treatment options, and if necessary, the need for more tests. Vaccines  Your health care provider may recommend certain vaccines, such as: Influenza vaccine. This is recommended every year. Tetanus, diphtheria, and acellular pertussis (Tdap, Td) vaccine. You may need a Td booster every 10 years. Zoster vaccine. You may need this after age 46. Pneumococcal 13-valent conjugate (PCV13) vaccine. One dose is recommended after age 42. Pneumococcal polysaccharide (PPSV23) vaccine. One dose is recommended after age 72. Talk to your health care provider about which screenings and vaccines you need and how often you need them. This information is not intended to replace advice given to you by your health care provider. Make sure you discuss any questions you have with your health care provider. Document Released: 07/15/2015 Document Revised: 03/07/2016 Document Reviewed: 04/19/2015 Elsevier Interactive Patient Education  2017 [employer].  Fall Prevention in the Home Falls can cause injuries. They can happen to people of all ages. There are many things you can do to make your home safe and to help prevent falls. What can I do on the outside of my home? Regularly fix the edges of walkways and driveways and fix any cracks. Remove anything that might make you trip as you walk through a door, such as a raised step or threshold. Trim any bushes or trees on the path to your home. Use bright outdoor lighting. Clear any walking paths of anything that might make someone trip, such as rocks or tools. Regularly check to see if handrails are loose or broken. Make sure that both sides of any steps have handrails. Any raised decks and porches should have guardrails on the edges. Have any leaves, snow, or ice cleared regularly. Use sand or salt on walking paths during winter. Clean up any spills in your garage right away. This includes oil  or grease spills. What can I do in the bathroom? Use night lights. Install grab bars by the toilet and in the tub and shower. Do not use towel bars as grab bars. Use non-skid mats or decals in the tub or shower. If you need to sit down in the shower, use a plastic, non-slip stool. Keep the floor dry. Clean up any water that spills on the floor as soon as it happens. Remove soap buildup in the tub or shower regularly. Attach bath mats securely with double-sided non-slip rug tape. Do not have throw rugs and other things on the floor that can make you trip. What can I do in the bedroom? Use night lights. Make sure that you have a light by your bed that is easy to reach. Do not use any sheets or blankets that are too big for your bed. They should not hang down onto the floor. Have a firm chair that has side arms. You can use this for support while you get dressed. Do not have throw rugs and other things on the floor that can make you trip. What can I do in the  kitchen? Clean up any spills right away. Avoid walking on wet floors. Keep items that you use a lot in easy-to-reach places. If you need to reach something above you, use a strong step stool that has a grab bar. Keep electrical cords out of the way. Do not use floor polish or wax that makes floors slippery. If you must use wax, use non-skid floor wax. Do not have throw rugs and other things on the floor that can make you trip. What can I do with my stairs? Do not leave any items on the stairs. Make sure that there are handrails on both sides of the stairs and use them. Fix handrails that are broken or loose. Make sure that handrails are as long as the stairways. Check any carpeting to make sure that it is firmly attached to the stairs. Fix any carpet that is loose or worn. Avoid having throw rugs at the top or bottom of the stairs. If you do have throw rugs, attach them to the floor with carpet tape. Make sure that you have a light  switch at the top of the stairs and the bottom of the stairs. If you do not have them, ask someone to add them for you. What else can I do to help prevent falls? Wear shoes that: Do not have high heels. Have rubber bottoms. Are comfortable and fit you well. Are closed at the toe. Do not wear sandals. If you use a stepladder: Make sure that it is fully opened. Do not climb a closed stepladder. Make sure that both sides of the stepladder are locked into place. Ask someone to hold it for you, if possible. Clearly mark and make sure that you can see: Any grab bars or handrails. First and last steps. Where the edge of each step is. Use tools that help you move around (mobility aids) if they are needed. These include: Canes. Walkers. Scooters. Crutches. Turn on the lights when you go into a dark area. Replace any light bulbs as soon as they burn out. Set up your furniture so you have a clear path. Avoid moving your furniture around. If any of your floors are uneven, fix them. If there are any pets around you, be aware of where they are. Review your medicines with your doctor. Some medicines can make you feel dizzy. This can increase your chance of falling. Ask your doctor what other things that you can do to help prevent falls. This information is not intended to replace advice given to you by your health care provider. Make sure you discuss any questions you have with your health care provider. Document Released: 04/14/2009 Document Revised: 11/24/2015 Document Reviewed: 07/23/2014 Elsevier Interactive Patient Education  2017 [employer].

## 2022-12-06 NOTE — Progress Notes (Signed)
I connected with  Mercedes [name] on 12/06/22 by a audio enabled telemedicine application and verified that I am speaking with the correct person using two identifiers.  Patient Location: Home  Provider Location: Home Office  I discussed the limitations of evaluation and management by telemedicine. The patient expressed understanding and agreed to proceed.  Subjective:   Mercedes [name] is a 77 y.o. female who presents for Medicare Annual (Subsequent) preventive examination.  Review of Systems      Cardiac Risk Factors include: advanced age (>74men, >80 women);hypertension;sedentary lifestyle     Objective:    Today's Vitals   12/06/22 1418  Weight: 210 lb (95.3 kg)  Height: 5\' 4"  (1.626 m)   Body mass index is 36.05 kg/m.     12/06/2022    2:30 PM 08/20/2022   10:41 AM 08/15/2022   11:18 AM 05/16/2022    9:18 AM 08/09/2021    1:11 PM 08/05/2020    1:50 PM 01/14/2018   12:24 PM  Advanced Directives  Does Patient Have a Medical Advance Directive? Yes No No Yes Yes Yes Yes  Type of Estate agent of [place];Living will   Healthcare Power of [place];Living will Living will;Healthcare Power of [employer] Power of [place];Living will Healthcare Power of [place];Living will  Does patient want to make changes to medical advance directive? No - Patient declined        Copy of Healthcare Power of Attorney in Chart? Yes - validated most recent copy scanned in chart (See row information)   No - copy requested  No - copy requested No - copy requested  Would patient like information on creating a medical advance directive?  No - Patient declined         Current Medications (verified) Outpatient Encounter Medications as of 12/06/2022  Medication Sig   ascorbic acid (VITAMIN C) 500 MG tablet Take 500 mg by mouth daily.   cholecalciferol (VITAMIN D) 1000 units tablet Take 1,000 Units by mouth 2 (two) times a day.    colchicine 0.6 MG tablet Take 1 tablet by  mouth twice daily as needed   CRANBERRY PO Take 1 capsule by mouth in the morning and at bedtime.   faricimab-svoa (VABYSMO) 6 MG/0.05ML SOLN intravitreal injection 0.05 mLs (6 mg total) by Intravitreal route as directed. (Patient taking differently: 6 mg by Intravitreal route every 6 (six) weeks.)   levothyroxine (SYNTHROID) 200 MCG tablet Take 1 tablet (200 mcg total) by mouth daily before breakfast.   levothyroxine (SYNTHROID) 25 MCG tablet TAKE 1 TABLET BY MOUTH ONCE DAILY BEFORE  BREAKFAST  ON  MONDAY  THROUGH  FRIDAYS SKIP ON SATURDAY AND SUNDAY   metoprolol (TOPROL-XL) 200 MG 24 hr tablet Take 1 tablet (200 mg total) by mouth every morning.   Multiple Vitamin (MULTIVITAMIN WITH MINERALS) TABS tablet Take 1 tablet by mouth daily.   Multiple Vitamins-Minerals (PRESERVISION AREDS 2) CAPS Take 1 capsule by mouth 2 (two) times daily.   Omega 3 1000 MG CAPS Take 1,000 mg by mouth in the morning and at bedtime.   Potassium 99 MG TABS Take 99 mg by mouth in the morning and at bedtime.   pravastatin (PRAVACHOL) 40 MG tablet Take 1 tablet (40 mg total) by mouth daily. (Patient taking differently: Take 40 mg by mouth at bedtime.)   psyllium (REGULOID) 0.52 G capsule Take 1.04 g by mouth daily.   Pumpkin Seed-Soy Germ (AZO BLADDER CONTROL/GO-LESS PO) Take by mouth.   Trospium Chloride  60 MG CP24 Take 1 capsule (60 mg total) by mouth daily.   TURMERIC PO Take 750 mg by mouth daily.   zinc gluconate 50 MG tablet Take 50 mg by mouth daily.   No facility-administered encounter medications on file as of 12/06/2022.    Allergies (verified) Ace inhibitors, Clarithromycin, Fenofibrate, Lipitor [atorvastatin], Niacin, Penicillins, Sulfasalazine, and Tetracycline hcl   History: Past Medical History:  Diagnosis Date   Allergy    Cataract    bilateral-not an issue right now   Diverticulosis of colon    Fatty liver    Gout    HLD (hyperlipidemia)    under control   HTN (hypertension)    Hypothyroidism     Macular degeneration    Osteoarthritis    Rapid heartbeat    occasional, does not last long   Tobacco abuse    Past Surgical History:  Procedure Laterality Date   CATARACT EXTRACTION     CHOLECYSTECTOMY N/A 03/17/2014   Procedure: LAPAROSCOPIC CHOLECYSTECTOMY WITH INTRAOPERATIVE CHOLANGIOGRAM ;  Surgeon: Claud Kelp, MD;  Location: [facility];  Service: General;  Laterality: N/A;   COLONOSCOPY     removed polyps   lower back surgery      PARATHYROIDECTOMY Left 08/20/2022   Procedure: LEFT PARATHYROIDECTOMY;  Surgeon: Darnell Level, MD;  Location: [facility];  Service: General;  Laterality: Left;   SIGMOIDOSCOPY     Family History  Problem Relation Age of Onset   Ulcers Father    Angina Mother    Hypertension Brother    Prostate cancer Brother    Colon cancer Neg Hx    Breast cancer Neg Hx    Esophageal cancer Neg Hx    Stomach cancer Neg Hx    Rectal cancer Neg Hx    Social History   Socioeconomic History   Marital status: Married    Spouse name: Social worker   Number of children: 2   Years of education: Not on file   Highest education level: Not on file  Occupational History   Occupation: Teacher-retired  Tobacco Use   Smoking status: Former    Packs/day: 0.25    Years: 15.00    Additional pack years: 0.00    Total pack years: 3.75    Types: Cigarettes    Quit date: 07/04/2010    Years since quitting: 12.4   Smokeless tobacco: Never  Vaping Use   Vaping Use: Never used  Substance and Sexual Activity   Alcohol use: Yes    Comment: occas wine   Drug use: No   Sexual activity: Not Currently  Other Topics Concern   Not on file  Social History Narrative   Married 1964   2 kids, in [place].  2 grandkids   Volunteered prev with [employer]   Enjoyed golf but gave it up after her husband had a stroke   Social Determinants of Health   Financial Resource Strain: Low Risk  (12/06/2022)   Overall Financial Resource Strain (CARDIA)    Difficulty of Paying Living  Expenses: Not hard at all  Food Insecurity: No Food Insecurity (12/06/2022)   Hunger Vital Sign    Worried About Running Out of Food in the Last Year: Never true    Ran Out of Food in the Last Year: Never true  Transportation Needs: No Transportation Needs (12/06/2022)   PRAPARE - Administrator, Civil Service (Medical): No    Lack of Transportation (Non-Medical): No  Physical Activity: Inactive (12/06/2022)  Exercise Vital Sign    Days of Exercise per Week: 0 days    Minutes of Exercise per Session: 0 min  Stress: No Stress Concern Present (12/06/2022)   [employer] of Occupational Health - Occupational Stress Questionnaire    Feeling of Stress : Not at all  Social Connections: Socially Integrated (12/06/2022)   Social Connection and Isolation Panel [NHANES]    Frequency of Communication with Friends and Family: More than three times a week    Frequency of Social Gatherings with Friends and Family: More than three times a week    Attends Religious Services: More than 4 times per year    Active Member of [employer] or Organizations: Yes    Attends Engineer, structural: More than 4 times per year    Marital Status: Married    Tobacco Counseling Counseling given: Not Answered   Clinical Intake:  Pre-visit preparation completed: Yes  Pain : No/denies pain     Nutritional Risks: None Diabetes: No  How often do you need to have someone help you when you read instructions, pamphlets, or other written materials from your doctor or pharmacy?: 1 - Never  Diabetic? no  Interpreter Needed?: No  Information entered by :: C.Clothilde Tippetts LPN   Activities of Daily Living    12/06/2022    2:32 PM 12/03/2022    7:17 PM  In your present state of health, do you have any difficulty performing the following activities:  Hearing? 0 0  Vision? 0 0  Difficulty concentrating or making decisions? 0 0  Walking or climbing stairs? 0 0  Dressing or bathing? 0 0  Doing errands,  shopping? 0 0  Preparing Food and eating ? N N  Using the Toilet? N N  In the past six months, have you accidently leaked urine? N N  Do you have problems with loss of bowel control? N N  Managing your Medications? N N  Managing your Finances? N N  Housekeeping or managing your Housekeeping? N N    Patient Care Team: Joaquim Nam, MD as PCP - General (Family Medicine) Lockie Mola, MD as Referring Physician (Ophthalmology) Deirdre Evener, MD as Referring Physician (Dermatology)  Indicate any recent Medical Services you may have received from other than [facility] providers in the past year (date may be approximate).     Assessment:   This is a routine wellness examination for Mercedes [name].  Hearing/Vision screen Hearing Screening - Comments:: No hearing issues Vision Screening - Comments:: No vision issues - [facility]  Dietary issues and exercise activities discussed: Current Exercise Habits: The patient does not participate in regular exercise at present, Exercise limited by: None identified   Goals Addressed             This Visit's Progress    Patient Stated       Exercise more       Depression Screen    12/06/2022    2:29 PM 08/02/2022   11:02 AM 12/12/2021   12:14 PM 08/05/2020    1:52 PM 06/17/2020   12:16 PM 01/22/2019   11:07 AM 01/14/2018   12:25 PM  PHQ 2/9 Scores  PHQ - 2 Score 0 0 0 0 0 0 0  PHQ- 9 Score  0  0   0    Fall Risk    12/06/2022    2:31 PM 12/03/2022    7:17 PM 08/02/2022   11:02 AM 12/12/2021   12:09 PM 08/05/2020  1:52 PM  Fall Risk   Falls in the past year? 1 1 1  0 0  Number falls in past yr: 0 0 0 0 0  Comment knee gave out causing fall      Injury with Fall? 1 1 1  0 0  Comment right shoulder      Risk for fall due to : No Fall Risks  No Fall Risks No Fall Risks No Fall Risks  Follow up Falls prevention discussed;Falls evaluation completed  Falls evaluation completed Falls evaluation completed Falls evaluation completed;Falls  prevention discussed    FALL RISK PREVENTION PERTAINING TO THE HOME:  Any stairs in or around the home? Yes  If so, are there any without handrails? No  Home free of loose throw rugs in walkways, pet beds, electrical cords, etc? Yes  Adequate lighting in your home to reduce risk of falls? Yes   ASSISTIVE DEVICES UTILIZED TO PREVENT FALLS:  Life alert? No  Use of a cane, walker or w/c? No  Grab bars in the bathroom? Yes  Shower chair or bench in shower? Yes  Elevated toilet seat or a handicapped toilet? Yes   Cognitive Function:    08/05/2020    1:54 PM 01/14/2018   12:25 PM 12/23/2015    8:52 AM  MMSE - Mini Mental State Exam  Orientation to time 5 5 5   Orientation to Place 5 5 5   Registration 3 3 3   Attention/ Calculation 5 0 0  Recall 3 3 3   Language- name 2 objects  0 0  Language- repeat 1 1 1   Language- follow 3 step command  3 3  Language- read & follow direction  0 0  Write a sentence  0 0  Copy design  0 0  Total score  20 20        12/06/2022    2:32 PM  6CIT Screen  What Year? 0 points  What month? 0 points  What time? 0 points  Count back from 20 0 points  Months in reverse 0 points  Repeat phrase 0 points  Total Score 0 points    Immunizations Immunization History  Administered Date(s) Administered   COVID-19, mRNA, vaccine(Comirnaty)12 years and older 04/17/2022   Fluad Quad(high Dose 65+) 03/12/2019, 03/26/2020, 03/17/2021, 04/12/2022   H1N1 07/08/2008   Influenza Whole 05/23/2004   Influenza,inj,Quad PF,6+ Mos 04/07/2014, 03/31/2015, 04/18/2016, 04/04/2017, 04/17/2018   PFIZER Comirnaty(Gray Top)Covid-19 Tri-Sucrose Vaccine 01/17/2021   PFIZER(Purple Top)SARS-COV-2 Vaccination 07/09/2019, 07/30/2019, 04/25/2020   Pfizer Covid-19 Vaccine Bivalent Booster 51yrs & up 05/02/2021   Pneumococcal Conjugate-13 12/13/2014   Pneumococcal Polysaccharide-23 08/09/2010   Rabies, IM 07/30/2017, 08/02/2017, 08/06/2017, 08/13/2017   Rsv, Bivalent, Protein  Subunit Rsvpref,pf Verdis Frederickson) 04/17/2022   Td 07/02/1998, 07/08/2008   Tdap 07/30/2017   Zoster Recombinat (Shingrix) 09/21/2020, 11/22/2020   Zoster, Live 10/30/2010    TDAP status: Up to date  Flu Vaccine status: Up to date  Pneumococcal vaccine status: Up to date  Covid-19 vaccine status: Information provided on how to obtain vaccines.   Qualifies for Shingles Vaccine? Yes   Zostavax completed Yes   Shingrix Completed?: Yes  Screening Tests Health Maintenance  Topic Date Due   INFLUENZA VACCINE  01/31/2023   MAMMOGRAM  07/04/2023   Colonoscopy  11/05/2023   Medicare Annual Wellness (AWV)  12/06/2023   DTaP/Tdap/Td (4 - Td or Tdap) 07/31/2027   Pneumonia Vaccine 73+ Years old  Completed   DEXA SCAN  Completed   COVID-19 Vaccine  Completed  Hepatitis C Screening  Completed   Zoster Vaccines- Shingrix  Completed   HPV VACCINES  Aged Out    Health Maintenance  There are no preventive care reminders to display for this patient.   Colorectal cancer screening: Type of screening: Colonoscopy. Completed 11/04/20. Repeat every 3 years Will discuss with PCP  Mammogram status: Completed 07/03/2022. Repeat every year   Bone Density status: Completed 07/18/22. Results reflect: Bone density results: OSTEOPENIA. Repeat every 2 years.  Lung Cancer Screening: (Low Dose CT Chest recommended if Age 37-80 years, 30 pack-year currently smoking OR have quit w/in 15years.) does not qualify.   Lung Cancer Screening Referral: no  Additional Screening:  Hepatitis C Screening: does qualify; Completed 12/23/15  Vision Screening: Recommended annual ophthalmology exams for early detection of glaucoma and other disorders of the eye. Is the patient up to date with their annual eye exam?  Yes  Who is the provider or what is the name of the office in which the patient attends annual eye exams? [facility] If pt is not established with a provider, would they like to be referred to a provider  to establish care? Yes .   Dental Screening: Recommended annual dental exams for proper oral hygiene  Community Resource Referral / Chronic Care Management: CRR required this visit?  No   CCM required this visit?  No      Plan:     I have personally reviewed and noted the following in the patient's chart:   Medical and social history Use of alcohol, tobacco or illicit drugs  Current medications and supplements including opioid prescriptions. Patient is not currently taking opioid prescriptions. Functional ability and status Nutritional status Physical activity Advanced directives List of other physicians Hospitalizations, surgeries, and ER visits in previous 12 months Vitals Screenings to include cognitive, depression, and falls Referrals and appointments  In addition, I have reviewed and discussed with patient certain preventive protocols, quality metrics, and best practice recommendations. A written personalized care plan for preventive services as well as general preventive health recommendations were provided to patient.     Maryan Puls, LPN   07/07/1094   Nurse Notes: none

## 2022-12-11 DIAGNOSIS — H353221 Exudative age-related macular degeneration, left eye, with active choroidal neovascularization: Secondary | ICD-10-CM | POA: Diagnosis not present

## 2022-12-13 ENCOUNTER — Ambulatory Visit (INDEPENDENT_AMBULATORY_CARE_PROVIDER_SITE_OTHER): Payer: PPO | Admitting: Family Medicine

## 2022-12-13 ENCOUNTER — Ambulatory Visit (INDEPENDENT_AMBULATORY_CARE_PROVIDER_SITE_OTHER)
Admission: RE | Admit: 2022-12-13 | Discharge: 2022-12-13 | Disposition: A | Discharge status: Home / Self Care | Payer: PPO | Source: Ambulatory Visit | Attending: Family Medicine | Admitting: Family Medicine

## 2022-12-13 ENCOUNTER — Encounter: Payer: Self-pay | Admitting: Family Medicine

## 2022-12-13 VITALS — BP 126/76 | HR 81 | Temp 97.7°F | Ht 64.0 in | Wt 209.0 lb

## 2022-12-13 DIAGNOSIS — R202 Paresthesia of skin: Secondary | ICD-10-CM | POA: Diagnosis not present

## 2022-12-13 DIAGNOSIS — M25511 Pain in right shoulder: Secondary | ICD-10-CM | POA: Diagnosis not present

## 2022-12-13 LAB — BASIC METABOLIC PANEL
BUN: 19 mg/dL (ref 6–23)
CO2: 31 mEq/L (ref 19–32)
Calcium: 9.7 mg/dL (ref 8.4–10.5)
Chloride: 101 mEq/L (ref 96–112)
Creatinine, Ser: 0.84 mg/dL (ref 0.40–1.20)
GFR: 66.18 mL/min (ref >60.00)
Glucose, Bld: 100 mg/dL — ABNORMAL HIGH (ref 70–99)
Potassium: 4.7 mEq/L (ref 3.5–5.1)
Sodium: 141 mEq/L (ref 135–145)

## 2022-12-13 LAB — VITAMIN B12: Vitamin B-12: 500 pg/mL (ref 211–911)

## 2022-12-13 LAB — TSH: TSH: 5.65 u[IU]/mL — ABNORMAL HIGH (ref 0.35–5.50)

## 2022-12-13 NOTE — Patient Instructions (Signed)
Go to the lab on the way out.   If you have mychart we'll likely use that to update you.    Take care.  Glad to see you. 

## 2022-12-13 NOTE — Progress Notes (Signed)
R shoulder pain.  Larey Seat in January.  Hit on R shoulder.  The next day she couldn't raise her arm.  She went to therapy.  Now can raise R arm to 90 deg.  Still has trouble combing her hair.  Pain with ext and int rotation.    She has paresthesias/tingling in the R toes.  Less on the L toes. No hand sx.  She has intact plantar and dorsiflexion B.  Her back pain is clearly better since the surgery, still with some back pain upon standing.  No radicular sx now.  Unclear duration of paresthesia, but likely prior to surgery.    Meds, vitals, and allergies reviewed.   ROS: Per HPI unless specifically indicated in ROS section   Nad Ncat Neck supple, no LA She has normal B foot inspection, vibration, light touch and monofilament.   R shoulder pain with int and ext rotation.  Pain with overhead movement.

## 2022-12-16 ENCOUNTER — Other Ambulatory Visit: Payer: Self-pay | Admitting: Family Medicine

## 2022-12-16 DIAGNOSIS — M25511 Pain in right shoulder: Secondary | ICD-10-CM | POA: Insufficient documentation

## 2022-12-16 DIAGNOSIS — R202 Paresthesia of skin: Secondary | ICD-10-CM | POA: Insufficient documentation

## 2022-12-16 DIAGNOSIS — E038 Other specified hypothyroidism: Secondary | ICD-10-CM

## 2022-12-16 NOTE — Assessment & Plan Note (Signed)
Normal sensation today but she has history of symptoms.  See notes on labs.  Differential diagnosis discussed with patient.

## 2022-12-16 NOTE — Assessment & Plan Note (Signed)
Check plain films. D/w pt about arm swings.   Discussed likely rotator cuff pathology and rationale for seeing orthopedics in [place].

## 2022-12-19 ENCOUNTER — Encounter: Payer: Self-pay | Admitting: *Deleted

## 2022-12-20 ENCOUNTER — Other Ambulatory Visit (HOSPITAL_COMMUNITY)
Admission: RE | Admit: 2022-12-20 | Discharge: 2022-12-20 | Disposition: A | Discharge status: Home / Self Care | Payer: PPO | Source: Other Acute Inpatient Hospital

## 2022-12-20 ENCOUNTER — Other Ambulatory Visit (HOSPITAL_COMMUNITY)
Admission: RE | Admit: 2022-12-20 | Discharge: 2022-12-20 | Disposition: A | Discharge status: Home / Self Care | Payer: PPO | Source: Ambulatory Visit | Attending: Obstetrics and Gynecology | Admitting: Obstetrics and Gynecology

## 2022-12-20 ENCOUNTER — Encounter: Payer: Self-pay | Admitting: Obstetrics and Gynecology

## 2022-12-20 ENCOUNTER — Ambulatory Visit (INDEPENDENT_AMBULATORY_CARE_PROVIDER_SITE_OTHER): Payer: PPO | Admitting: Obstetrics and Gynecology

## 2022-12-20 VITALS — BP 151/62 | HR 77

## 2022-12-20 DIAGNOSIS — R3915 Urgency of urination: Secondary | ICD-10-CM | POA: Diagnosis not present

## 2022-12-20 DIAGNOSIS — N952 Postmenopausal atrophic vaginitis: Secondary | ICD-10-CM | POA: Insufficient documentation

## 2022-12-20 DIAGNOSIS — N390 Urinary tract infection, site not specified: Secondary | ICD-10-CM | POA: Insufficient documentation

## 2022-12-20 DIAGNOSIS — B9689 Other specified bacterial agents as the cause of diseases classified elsewhere: Secondary | ICD-10-CM | POA: Insufficient documentation

## 2022-12-20 DIAGNOSIS — R350 Frequency of micturition: Secondary | ICD-10-CM

## 2022-12-20 LAB — POCT URINALYSIS DIPSTICK
Bilirubin, UA: NEGATIVE
Glucose, UA: NEGATIVE
Ketones, UA: NEGATIVE
Nitrite, UA: NEGATIVE
Protein, UA: POSITIVE — AB
Spec Grav, UA: 1.02 (ref 1.010–1.025)
Urobilinogen, UA: 0.2 E.U./dL
pH, UA: 5 (ref 5.0–8.0)

## 2022-12-20 MED ORDER — NONFORMULARY OR COMPOUNDED ITEM: 5 refills | Status: DC

## 2022-12-20 MED ORDER — NITROFURANTOIN MONOHYD MACRO 100 MG PO CAPS: 100.0000 mg | ORAL_CAPSULE | Freq: Two times a day (BID) | ORAL | 0 refills | Status: AC

## 2022-12-20 MED ORDER — VIBEGRON 75 MG PO TABS: 75.0000 mg | ORAL_TABLET | Freq: Every day | ORAL | 5 refills | Status: DC

## 2022-12-20 NOTE — Progress Notes (Signed)
[facility] Return Visit  SUBJECTIVE  History of Present Illness: Mercedes [name] is a 77 y.o. female seen in follow-up for vaginal pain, spotting, and UTI symptoms.   Endorses pink spotting when she is urinating, she feels it is from the vagina.    Is having constipation that she believes is related to the Trospium. She is still urinating every 45 minutes to an hour on the Trospium.   Takes cranberry twice daily and D-mannose nightly.   Past Medical History: Patient  has a past medical history of Allergy, Cataract, Diverticulosis of colon, Fatty liver, Gout, HLD (hyperlipidemia), HTN (hypertension), Hypothyroidism, Macular degeneration, Osteoarthritis, Rapid heartbeat, and Tobacco abuse.   Past Surgical History: She  has a past surgical history that includes Sigmoidoscopy; Colonoscopy; Cholecystectomy (N/A, 03/17/2014); Cataract extraction; lower back surgery ; and Parathyroidectomy (Left, 08/20/2022).   Medications: She has a current medication list which includes the following prescription(s): nitrofurantoin (macrocrystal-monohydrate), ascorbic acid, cholecalciferol, colchicine, cranberry, vabysmo, levothyroxine, levothyroxine, metoprolol, multivitamin with minerals, preservision areds 2, NONFORMULARY OR COMPOUNDED ITEM, omega 3, potassium, pravastatin, psyllium, pumpkin seed-soy germ, turmeric, and zinc gluconate.   Allergies: Patient is allergic to ace inhibitors, clarithromycin, fenofibrate, lipitor [atorvastatin], niacin, penicillins, sulfasalazine, and tetracycline hcl.   Social History: Patient  reports that she quit smoking about 12 years ago. Her smoking use included cigarettes. She has a 3.75 pack-year smoking history. She has never used smokeless tobacco. She reports current alcohol use. She reports that she does not use drugs.      OBJECTIVE   POC Urine: Positive for protein, Small blood, and large leukocytes.   Physical Exam: Vitals:   12/20/22 0946  12/20/22 1007  BP: (!) 154/80 (!) 151/62  Pulse: 75 77   Gen: No apparent distress, A&O x 3.  Detailed Urogynecologic Evaluation:   No bleeding noted vaginally, patient had E-string in place and vaginal atrophy looked improved on exam, but patient reports discomfort. E-string removed.    ASSESSMENT AND PLAN    [name] is a 77 y.o. with:  1. Urinary frequency   2. Recurrent UTI   3. Urinary urgency   4. Vaginal atrophy    POC Urine: Positive for blood and leukocytes, will send for culture. She also reports the Trospium is not working well as she is still urinating every 69mins-1hour. Encouraged patient to discontinue Trospium. She has tried [place] and do not feel like starting another anticholinergic is a good plan at this time. Cannot take Myrebtriq as she is on a beta blocker. At this point in therapy she should consider tertiary options including Bladder botox and SNM. She has considered botox before but not SNM. Information given for her to consider.  Will treat for UTI today based on symptoms and WBC in urine.  Considering between Bladder botox and SNM.  Patient has tried and failed multiple types of vaginal estrogen including cream, yuvafem, and E-string. At this point with her continued reports of burning and discomfort will do an Aptima swab to rule out BV or yeast. Also will order Vulvodynia cream (Amitriptyline 2.5%/ gabapentin 2.5%/ baclofen 2.5% in vaginal cream) at compounded pharmacy to see if this is helpful with her symptoms.  Patient to follow up in 6 weeks or sooner if needed. Encouraged her to give the medications some time to work and if she is not seeing improvement by the end of next week for her to give [place] a call.

## 2022-12-20 NOTE — Patient Instructions (Signed)
Please start the antibiotic for UTI we will sent this for culture  [facility]  Consider the option between botox and SNM.   I will call you if your swab shows yeast or BV.

## 2022-12-21 LAB — CERVICOVAGINAL ANCILLARY ONLY
Bacterial Vaginitis (gardnerella): NEGATIVE
Candida Glabrata: NEGATIVE
Candida Vaginitis: NEGATIVE
Comment: NEGATIVE
Comment: NEGATIVE
Comment: NEGATIVE

## 2022-12-22 LAB — URINE CULTURE: Culture: 30000 — AB

## 2023-01-02 DIAGNOSIS — M25511 Pain in right shoulder: Secondary | ICD-10-CM | POA: Diagnosis not present

## 2023-01-13 ENCOUNTER — Other Ambulatory Visit: Payer: Self-pay | Admitting: Family Medicine

## 2023-01-22 DIAGNOSIS — H353221 Exudative age-related macular degeneration, left eye, with active choroidal neovascularization: Secondary | ICD-10-CM | POA: Diagnosis not present

## 2023-01-23 DIAGNOSIS — M25511 Pain in right shoulder: Secondary | ICD-10-CM | POA: Diagnosis not present

## 2023-01-25 ENCOUNTER — Other Ambulatory Visit: Payer: Self-pay | Admitting: Family Medicine

## 2023-01-25 DIAGNOSIS — M25511 Pain in right shoulder: Secondary | ICD-10-CM | POA: Diagnosis not present

## 2023-02-04 ENCOUNTER — Ambulatory Visit (INDEPENDENT_AMBULATORY_CARE_PROVIDER_SITE_OTHER): Payer: PPO | Admitting: Nurse Practitioner

## 2023-02-04 ENCOUNTER — Encounter: Payer: Self-pay | Admitting: Nurse Practitioner

## 2023-02-04 VITALS — BP 110/78 | HR 69 | Temp 97.7°F | Ht 64.0 in | Wt 209.6 lb

## 2023-02-04 DIAGNOSIS — H6122 Impacted cerumen, left ear: Secondary | ICD-10-CM | POA: Diagnosis not present

## 2023-02-04 DIAGNOSIS — J069 Acute upper respiratory infection, unspecified: Secondary | ICD-10-CM | POA: Diagnosis not present

## 2023-02-04 MED ORDER — FLUTICASONE PROPIONATE 50 MCG/ACT NA SUSP: 2.0000 | Freq: Every day | NASAL | 0 refills | Status: DC

## 2023-02-04 NOTE — Patient Instructions (Signed)
Nice to see you today I do think this is viral in nature and should start clearing in the next 3 days Message my on Friday if you are not improving

## 2023-02-04 NOTE — Assessment & Plan Note (Signed)
COVID test x 2 negative at home.  Do think this is viral in nature she should start improving over the course of this week if she does not by Friday she will reach out with consider sending in azithromycin pack for atypical infection

## 2023-02-04 NOTE — Progress Notes (Signed)
Acute Office Visit  Subjective:     Patient ID: Mercedes [name], female    DOB: [birth year], 77 y.o.   MRN: [id number]  Chief Complaint  Patient presents with   Sinus Problem    Started Wednesday night. Hoarseness, diarrhea for two days, dizziness, no appetite, pounding headache, fatigue, nausea. Runny nose. Two covid tests on sat&sun (both neg)      Patient is in today for sick symptoms with a history of htn, hypothyroidism, HLD  Symtpoms started on Wednesday 01/30/2023 Covid vaccine: UTD RSV vaccine: UTD Flu vaccine: completed this season  States that she has used benadryl that has helped her sleep but nothing for symptoms management    Review of Systems  Constitutional:  Positive for malaise/fatigue. Negative for chills and fever.       Decreased appetite  HENT:  Positive for congestion, sinus pain (intermittent) and sore throat. Negative for ear discharge and ear pain.   Respiratory:  Positive for cough and shortness of breath (with exterion).   Cardiovascular:  Negative for chest pain.  Gastrointestinal:  Positive for diarrhea (for 2 days) and nausea. Negative for abdominal pain and vomiting.  Musculoskeletal:  Negative for joint pain and myalgias.  Neurological:  Negative for headaches.        Objective:    BP 110/78   Pulse 69   Temp 97.7 F (36.5 C) (Temporal)   Ht 5\' 4"  (1.626 m)   Wt 209 lb 9.6 oz (95.1 kg)   SpO2 99%   BMI 35.98 kg/m  BP Readings from Last 3 Encounters:  02/04/23 110/78  12/20/22 (!) 151/62  12/13/22 126/76   Wt Readings from Last 3 Encounters:  02/04/23 209 lb 9.6 oz (95.1 kg)  12/13/22 209 lb (94.8 kg)  12/06/22 210 lb (95.3 kg)      Physical Exam Vitals and nursing note reviewed.  Constitutional:      Appearance: Normal appearance.  HENT:     Right Ear: Tympanic membrane, ear canal and external ear normal.     Left Ear: Ear canal and external ear normal. There is impacted cerumen.     Ears:     Comments: Clear fluid  behind right TM     Mouth/Throat:     Mouth: Mucous membranes are moist.     Pharynx: Oropharynx is clear.  Cardiovascular:     Rate and Rhythm: Normal rate and regular rhythm.     Heart sounds: Normal heart sounds.  Pulmonary:     Effort: Pulmonary effort is normal.     Breath sounds: Normal breath sounds.  Abdominal:     General: Bowel sounds are normal. There is no distension.     Tenderness: There is no abdominal tenderness.     Hernia: No hernia is present.  Lymphadenopathy:     Cervical: No cervical adenopathy.  Neurological:     Mental Status: She is alert.     No results found for any visits on 02/04/23.      Assessment & Plan:   Problem List Items Addressed This Visit       Respiratory   Upper respiratory tract infection - Primary    COVID test x 2 negative at home.  Do think this is viral in nature she should start improving over the course of this week if she does not by Friday she will reach out with consider sending in azithromycin pack for atypical infection        Nervous and Auditory  Impacted cerumen of left ear    Verbal consent obtained.  Patient was prepped per office policy with cerumen softening eardrops.  A mixture of water and hydroperoxide was used.  Patient tolerated procedure well impaction was removed       Meds ordered this encounter  Medications   fluticasone (FLONASE) 50 MCG/ACT nasal spray    Sig: Place 2 sprays into both nostrils daily.    Dispense:  16 g    Refill:  0    Order Specific Question:   Supervising Provider    Answer:   TOWER, MARNE A [[id number]    Return if symptoms worsen or fail to improve.  Audria Nine, NP

## 2023-02-04 NOTE — Assessment & Plan Note (Signed)
Verbal consent obtained.  Patient was prepped per office policy with cerumen softening eardrops.  A mixture of water and hydroperoxide was used.  Patient tolerated procedure well impaction was removed

## 2023-02-06 ENCOUNTER — Ambulatory Visit: Payer: PPO | Admitting: Dermatology

## 2023-02-06 DIAGNOSIS — L729 Follicular cyst of the skin and subcutaneous tissue, unspecified: Secondary | ICD-10-CM

## 2023-02-06 DIAGNOSIS — L814 Other melanin hyperpigmentation: Secondary | ICD-10-CM | POA: Diagnosis not present

## 2023-02-06 DIAGNOSIS — L821 Other seborrheic keratosis: Secondary | ICD-10-CM | POA: Diagnosis not present

## 2023-02-06 DIAGNOSIS — L720 Epidermal cyst: Secondary | ICD-10-CM | POA: Diagnosis not present

## 2023-02-06 DIAGNOSIS — D1801 Hemangioma of skin and subcutaneous tissue: Secondary | ICD-10-CM | POA: Diagnosis not present

## 2023-02-06 DIAGNOSIS — Z1283 Encounter for screening for malignant neoplasm of skin: Secondary | ICD-10-CM | POA: Diagnosis not present

## 2023-02-06 DIAGNOSIS — W908XXA Exposure to other nonionizing radiation, initial encounter: Secondary | ICD-10-CM | POA: Diagnosis not present

## 2023-02-06 DIAGNOSIS — D229 Melanocytic nevi, unspecified: Secondary | ICD-10-CM

## 2023-02-06 DIAGNOSIS — L578 Other skin changes due to chronic exposure to nonionizing radiation: Secondary | ICD-10-CM | POA: Diagnosis not present

## 2023-02-06 NOTE — Patient Instructions (Signed)
Due to recent changes in healthcare laws, you may see results of your pathology and/or laboratory studies on MyChart before the doctors have had a chance to review them. We understand that in some cases there may be results that are confusing or concerning to you. Please understand that not all results are received at the same time and often the doctors may need to interpret multiple results in order to provide you with the best plan of care or course of treatment. Therefore, we ask that you please give Mercedes [name] 2 business days to thoroughly review all your results before contacting the office for clarification. Should we see a critical lab result, you will be contacted sooner.   If You Need Anything After Your Visit  If you have any questions or concerns for your doctor, please call our main line at [phone] and press option 4 to reach your doctor's medical assistant. If no one answers, please leave a voicemail as directed and we will return your call as soon as possible. Messages left after 4 pm will be answered the following business day.   You may also send Mercedes [name] a message via MyChart. We typically respond to MyChart messages within 1-2 business days.  For prescription refills, please ask your pharmacy to contact our office. Our fax number is [phone].  If you have an urgent issue when the clinic is closed that cannot wait until the next business day, you can page your doctor at the number below.    Please note that while we do our best to be available for urgent issues outside of office hours, we are not available 24/7.   If you have an urgent issue and are unable to reach Mercedes [name], you may choose to seek medical care at your doctor's office, retail clinic, urgent care center, or emergency room.  If you have a medical emergency, please immediately call 911 or go to the emergency department.  Pager Numbers  - Dr. Gwen Pounds: [phone]  - Dr. Roseanne Reno: [phone]  In the event of inclement  weather, please call our main line at [phone] for an update on the status of any delays or closures.  Dermatology Medication Tips: Please keep the boxes that topical medications come in in order to help keep track of the instructions about where and how to use these. Pharmacies typically print the medication instructions only on the boxes and not directly on the medication tubes.   If your medication is too expensive, please contact our office at [phone] option 4 or send Mercedes [name] a message through MyChart.   We are unable to tell what your co-pay for medications will be in advance as this is different depending on your insurance coverage. However, we may be able to find a substitute medication at lower cost or fill out paperwork to get insurance to cover a needed medication.   If a prior authorization is required to get your medication covered by your insurance company, please allow Mercedes [name] 1-2 business days to complete this process.  Drug prices often vary depending on where the prescription is filled and some pharmacies may offer cheaper prices.  The website www.goodrx.com contains coupons for medications through different pharmacies. The prices here do not account for what the cost may be with help from insurance (it may be cheaper with your insurance), but the website can give you the price if you did not use any insurance.  - You can print the associated coupon and take it with your prescription to the pharmacy.  -  You may also stop by our office during regular business hours and pick up a GoodRx coupon card.  - If you need your prescription sent electronically to a different pharmacy, notify our office through [facility] or by phone at [phone] option 4.     Si Usted Necesita Algo Despus de Su Visita  Tambin puede enviarnos un mensaje a travs de Clinical cytogeneticist. Por lo general respondemos a los mensajes de MyChart en el transcurso de 1 a 2 das hbiles.  Para renovar recetas,  por favor pida a su farmacia que se ponga en contacto con nuestra oficina. Annie Sable de fax es [place] [phone].  Si tiene un asunto urgente cuando la clnica est cerrada y que no puede esperar hasta el siguiente da hbil, puede llamar/localizar a su doctor(a) al nmero que aparece a continuacin.   Por favor, tenga en cuenta que aunque hacemos todo lo posible para estar disponibles para asuntos urgentes fuera del horario de [place], no estamos disponibles las 24 horas del da, los 7 [address]  [address] de la [place].   Si tiene un problema urgente y no puede comunicarse con nosotros, puede optar por buscar atencin mdica  en el consultorio de su doctor(a), en una clnica privada, en un centro de atencin urgente o en una sala de emergencias.  Si tiene Engineer, drilling, por favor llame inmediatamente al 911 o vaya a la sala de emergencias.  Nmeros de bper  - Dr. Gwen Pounds: [phone]  - Dra. Roseanne Reno: [phone]  En caso de inclemencias del [place], por favor llame a Lacy Duverney principal al [phone] para una actualizacin sobre el [place] de cualquier retraso o cierre.  Consejos para la medicacin en dermatologa: Por favor, guarde las cajas en las que vienen los medicamentos de uso tpico para ayudarle a seguir las instrucciones sobre dnde y cmo usarlos. Las farmacias generalmente imprimen las instrucciones del medicamento slo en las cajas y no directamente en los tubos del [place].   Si su medicamento es muy caro, por favor, pngase en contacto con Rolm Gala llamando al [phone] y presione la opcin 4 o envenos un mensaje a travs de Clinical cytogeneticist.   No podemos decirle cul ser su copago por los medicamentos por adelantado ya que esto es diferente dependiendo de la cobertura de su seguro. Sin embargo, es posible que podamos encontrar un medicamento sustituto a Audiological scientist un formulario para que el seguro cubra el medicamento que se considera necesario.   Si se  requiere una autorizacin previa para que su compaa de seguros [place] su medicamento, por favor permtanos de 1 a 2 das hbiles para completar [address]  Los precios de los medicamentos varan con frecuencia dependiendo del Environmental consultant de dnde se surte la receta y alguna farmacias pueden ofrecer precios ms baratos.  El sitio web www.goodrx.com tiene cupones para medicamentos de Health and safety inspector. Los precios aqu no tienen en cuenta lo que podra costar con la ayuda del seguro (puede ser ms barato con su seguro), pero el sitio web puede darle el precio si no utiliz Tourist information centre manager.  - Puede imprimir el cupn correspondiente y llevarlo con su receta a la farmacia.  - Tambin puede pasar por nuestra oficina durante el horario de atencin regular y Education officer, museum una tarjeta de cupones de GoodRx.  - Si necesita que su receta se enve electrnicamente a una farmacia diferente, informe a nuestra oficina a travs de MyChart de [place] o por telfono llamando al [phone] y presione la opcin 4.

## 2023-02-06 NOTE — Progress Notes (Signed)
   Follow-Up Visit   Subjective  Mercedes [name] is a 77 y.o. female who presents for the following: Skin Cancer Screening and Full Body Skin Exam  The patient presents for Total-Body Skin Exam (TBSE) for skin cancer screening and mole check. The patient has spots, moles and lesions to be evaluated, some may be new or changing and the patient may have concern these could be cancer.   The following portions of the chart were reviewed this encounter and updated as appropriate: medications, allergies, medical history  Review of Systems:  No other skin or systemic complaints except as noted in HPI or Assessment and Plan.  Objective  Well appearing patient in no apparent distress; mood and affect are within normal limits.  A full examination was performed including scalp, head, eyes, ears, nose, lips, neck, chest, axillae, abdomen, back, buttocks, bilateral upper extremities, bilateral lower extremities, hands, feet, fingers, toes, fingernails, and toenails. All findings within normal limits unless otherwise noted below.   Relevant physical exam findings are noted in the Assessment and Plan.   Assessment & Plan   SKIN CANCER SCREENING PERFORMED TODAY.  ACTINIC DAMAGE - Chronic condition, secondary to cumulative UV/sun exposure - diffuse scaly erythematous macules with underlying dyspigmentation - Recommend daily broad spectrum sunscreen SPF 30+ to sun-exposed areas, reapply every 2 hours as needed.  - Staying in the shade or wearing long sleeves, sun glasses (UVA+UVB protection) and wide brim hats (4-inch brim around the entire circumference of the hat) are also recommended for sun protection.  - Call for new or changing lesions.  LENTIGINES, SEBORRHEIC KERATOSES, HEMANGIOMAS - Benign normal skin lesions - Benign-appearing - Call for any changes  MELANOCYTIC NEVI - Tan-brown and/or pink-flesh-colored symmetric macules and papules - Benign appearing on exam today - Observation -  Call clinic for new or changing moles - Recommend daily use of broad spectrum spf 30+ sunscreen to sun-exposed areas.   EPIDERMAL INCLUSION CYST Exam: Subcutaneous nodule at R scalp 1.5 cm  Benign-appearing. Exam most consistent with an epidermal inclusion cyst. Discussed that a cyst is a benign growth that can grow over time and sometimes get irritated or inflamed. Recommend observation if it is not bothersome. Discussed option of surgical excision to remove it if it is growing, symptomatic, or other changes noted. Please call for new or changing lesions so they can be evaluated.  Return for TBSE in 1-2 years.  Maylene Roes, CMA, am acting as scribe for Armida Sans, MD .  Documentation: I have reviewed the above documentation for accuracy and completeness, and I agree with the above.  Armida Sans, MD

## 2023-02-08 ENCOUNTER — Telehealth: Payer: Self-pay | Admitting: Family Medicine

## 2023-02-08 DIAGNOSIS — M25511 Pain in right shoulder: Secondary | ICD-10-CM | POA: Diagnosis not present

## 2023-02-08 MED ORDER — AZITHROMYCIN 250 MG PO TABS: ORAL_TABLET | ORAL | 0 refills | Status: AC

## 2023-02-08 NOTE — Telephone Encounter (Signed)
Azithromycin was sent to pharmacy on file. Tell her to not take the colchicine and the antibiotic at  the same time

## 2023-02-08 NOTE — Telephone Encounter (Signed)
Pt called stating she saw Cable on 8/5 for a possible sinus infection along with feeling fatigue, nauseous & having diarrhea. Pt states she was instructed to let Toney Reil know if her symptoms don't clear up or get better. Pt states she still feels bad, doesn't feel any relief, her symptoms are the same. Call back # [phone]

## 2023-02-08 NOTE — Telephone Encounter (Signed)
Called pt to relay information provided.  Informed pt to not take antibiotic the same time as colchicine  Pt has no questions or concerns.

## 2023-02-11 DIAGNOSIS — M25511 Pain in right shoulder: Secondary | ICD-10-CM | POA: Diagnosis not present

## 2023-02-12 ENCOUNTER — Other Ambulatory Visit (INDEPENDENT_AMBULATORY_CARE_PROVIDER_SITE_OTHER): Payer: PPO

## 2023-02-12 DIAGNOSIS — E038 Other specified hypothyroidism: Secondary | ICD-10-CM

## 2023-02-12 LAB — TSH: TSH: 15.52 u[IU]/mL — ABNORMAL HIGH (ref 0.35–5.50)

## 2023-02-14 DIAGNOSIS — M25511 Pain in right shoulder: Secondary | ICD-10-CM | POA: Diagnosis not present

## 2023-02-15 ENCOUNTER — Encounter: Payer: Self-pay | Admitting: Dermatology

## 2023-02-15 ENCOUNTER — Other Ambulatory Visit: Payer: Self-pay | Admitting: Family Medicine

## 2023-02-15 DIAGNOSIS — M25511 Pain in right shoulder: Secondary | ICD-10-CM | POA: Diagnosis not present

## 2023-02-15 DIAGNOSIS — M19111 Post-traumatic osteoarthritis, right shoulder: Secondary | ICD-10-CM | POA: Diagnosis not present

## 2023-02-15 DIAGNOSIS — E038 Other specified hypothyroidism: Secondary | ICD-10-CM

## 2023-02-15 MED ORDER — LEVOTHYROXINE SODIUM 25 MCG PO TABS: ORAL_TABLET | ORAL | Status: DC

## 2023-02-18 DIAGNOSIS — M25511 Pain in right shoulder: Secondary | ICD-10-CM | POA: Diagnosis not present

## 2023-02-19 ENCOUNTER — Encounter: Payer: Self-pay | Admitting: Physical Therapy

## 2023-02-19 ENCOUNTER — Ambulatory Visit: Payer: PPO | Attending: Obstetrics and Gynecology | Admitting: Physical Therapy

## 2023-02-19 DIAGNOSIS — R2689 Other abnormalities of gait and mobility: Secondary | ICD-10-CM

## 2023-02-19 DIAGNOSIS — M533 Sacrococcygeal disorders, not elsewhere classified: Secondary | ICD-10-CM | POA: Diagnosis not present

## 2023-02-19 DIAGNOSIS — R278 Other lack of coordination: Secondary | ICD-10-CM | POA: Diagnosis not present

## 2023-02-19 DIAGNOSIS — G8929 Other chronic pain: Secondary | ICD-10-CM

## 2023-02-19 DIAGNOSIS — M25511 Pain in right shoulder: Secondary | ICD-10-CM | POA: Diagnosis not present

## 2023-02-19 DIAGNOSIS — M5459 Other low back pain: Secondary | ICD-10-CM | POA: Diagnosis not present

## 2023-02-19 NOTE — Therapy (Unsigned)
OUTPATIENT PHYSICAL THERAPY EVALUATION   Patient Name: Mercedes [name] MRN: [id number] DOB:Jan 24, 1946, 77 y.o., female Today's Date: 02/19/2023   PT End of Session - 02/19/23 1433     Visit Number 1    Number of Visits 10    Date for PT Re-Evaluation 04/30/23    PT Start Time 1417    PT Stop Time 1500    PT Time Calculation (min) 43 min             Past Medical History:  Diagnosis Date   Allergy    Cataract    bilateral-not an issue right now   Diverticulosis of colon    Fatty liver    Gout    HLD (hyperlipidemia)    under control   HTN (hypertension)    Hypothyroidism    Macular degeneration    Osteoarthritis    Rapid heartbeat    occasional, does not last long   Tobacco abuse    Past Surgical History:  Procedure Laterality Date   CATARACT EXTRACTION     CHOLECYSTECTOMY N/A 03/17/2014   Procedure: LAPAROSCOPIC CHOLECYSTECTOMY WITH INTRAOPERATIVE CHOLANGIOGRAM ;  Surgeon: Claud Kelp, MD;  Location: [facility];  Service: General;  Laterality: N/A;   COLONOSCOPY     removed polyps   lower back surgery      PARATHYROIDECTOMY Left 08/20/2022   Procedure: LEFT PARATHYROIDECTOMY;  Surgeon: Darnell Level, MD;  Location: [facility];  Service: General;  Laterality: Left;   SIGMOIDOSCOPY     Patient Active Problem List   Diagnosis Date Noted   Paresthesia 12/16/2022   Right shoulder pain 12/16/2022   Acute recurrent cystitis 07/04/2022   Spondylolisthesis of lumbar region 05/21/2022   Elevated hemoglobin (HCC) 11/07/2021   Swelling of right index finger 11/06/2021   Vaginal discharge 01/06/2021   Urinary frequency 01/06/2021   Hyperparathyroidism, primary (HCC) 12/11/2020   Health care maintenance 01/23/2018   Macular degeneration 01/23/2018   Low back pain 12/29/2015   Advance care planning 12/14/2014   Vitamin D deficiency 12/14/2014   Impacted cerumen of left ear 01/29/2014   Hypercalcemia 12/01/2013   Hyperglycemia 10/02/2012   Medicare annual wellness  visit, subsequent 10/02/2012   Leg pain 07/09/2012   Upper respiratory tract infection 11/03/2010   Gout    HLD (hyperlipidemia)    Hypothyroidism 01/10/2007   HYPERTENSION, BENIGN ESSENTIAL 01/10/2007   MENOPAUSAL SYNDROME 01/10/2007   OSTEOARTHRITIS 01/10/2007    PCP: Para March   REFERRING PROVIDER: Cordelia Pen   REFERRING DIAG: urinary frequency and urgency   Rationale for Evaluation and Treatment Rehabilitation  THERAPY DIAG:  Other abnormalities of gait and mobility  Sacrococcygeal disorders, not elsewhere classified  Other lack of coordination  Other low back pain  Chronic right shoulder pain  ONSET DATE:   SUBJECTIVE:  SUBJECTIVE STATEMENT: 1) urinary frequency and urgency:   pt goes to the toilet once every 2 hours during day, every hour at night ( bedtime 11pm, wakes up 7am, drink wine 7pm, pt does not take any direutics) . Pt has to urinate again after leaving house 20 -30 min. Daily fluid intake:  16 fl oz water, 32 floz unsweeten tea, no soda, no coffee, 10 oz of wine per night.  SUI with sneezing   2) CLBP :  Hx of spinal fusion 2023. 6/10 pain after standing to cook for > 10 min.  Denied radiating pain   3) R shoulder pain:  since a fall in Jan 2024:  not able to fold laundry with lateral movement to the R, reaching up to put away dishes, to do hair, turning steering wheel in car  occurs with 4/10 . Denied radiating . Sleeps on both sides but no pillow between knees   PERTINENT HISTORY:    PAIN:  Are you having pain? Yes: see above   PRECAUTIONS: None  WEIGHT BEARING RESTRICTIONS: No  FALLS:  Has patient fallen in last 6 months? No  LIVING ENVIRONMENT: Lives with: lives with their spouse Lives in: house  Stairs: 3 STE with rail  Has following equipment at home:  No  OCCUPATION: Programmer, systems , hobbies: reading, cooking, going out with friends, card games, sewing   PLOF: Independent  PATIENT GOALS:  Pain free in shoulder, resume lifestyles includes walking , going to gym, participating in social function    OBJECTIVE:    [facility] PT Assessment - 02/19/23 1434       Other:   Other/ Comments hands behind head to do her hair: R digit III at top of ear, L digit III at T1      AROM   Overall AROM Comments shoulder flexion R 90, 135 deg,      Palpation   Spinal mobility no pain with all 4directions of spine, limited with R rotation > L    SI assessment  R shoulder / iliac crest lowered,      Ambulation/Gait   Gait Comments 1.29 m/s, limited posterior rotation of R shoulder and pelvis , excessive sway to L    Palpation:  lumbar scar restrictions              HOME EXERCISE PROGRAM: See pt instruction section    ASSESSMENT:  CLINICAL IMPRESSION: **   Pt benefits from skilled PT.    OBJECTIVE IMPAIRMENTS decreased activity tolerance, decreased coordination, decreased endurance, decreased mobility, difficulty walking, decreased ROM, decreased strength, decreased safety awareness, hypomobility, increased muscle spasms, impaired flexibility, improper body mechanics, postural dysfunction, and pain. scar restrictions   ACTIVITY LIMITATIONS  self-care,  sleep, home chores, work tasks    PARTICIPATION LIMITATIONS:  community, ADLs, activities    PERSONAL FACTORS       are also affecting patient's functional outcome.    REHAB POTENTIAL: Good   CLINICAL DECISION MAKING: Evolving/moderate complexity   EVALUATION COMPLEXITY: Moderate    PATIENT EDUCATION:    Education details: Showed pt anatomy images. Explained muscles attachments/ connection, physiology of deep core system/ spinal- thoracic-pelvis-lower kinetic chain as they relate to pt's presentation, Sx, and past Hx. Explained what and how these areas of deficits need to be restored  to balance and function    See Therapeutic activity / neuromuscular re-education section  Answered pt's questions.   Person educated: Patient Education method: Explanation, Demonstration, Tactile cues, Verbal cues, and Handouts Education comprehension:  verbalized understanding, returned demonstration, verbal cues required, tactile cues required, and needs further education     PLAN: PT FREQUENCY: 1x/week   PT DURATION: 10 weeks   PLANNED INTERVENTIONS: Therapeutic exercises, Therapeutic activity, Neuromuscular re-education, Balance training, Gait training, Patient/Family education, Self Care, Joint mobilization, Spinal mobilization, Moist heat, Taping, and Manual therapy, dry needling.   PLAN FOR NEXT SESSION: See clinical impression for plan     GOALS: Goals reviewed with patient? Yes  SHORT TERM GOALS: Target date: 03/19/2023    Pt will demo IND with HEP                    Baseline: Not IND            Goal status: INITIAL   LONG TERM GOALS: Target date: 04/30/2023    1.Pt will demo proper deep core coordination without chest breathing and optimal excursion of diaphragm/pelvic floor in order to promote spinal stability and pelvic floor function  Baseline: dyscoordination Goal status: INITIAL  2.  Pt will demo > 5 pt change on FOTO  to improve QOL and function  PFDI Urinary baseline -  Lower score = better function  Urinary Problem baseline-   Higher score = better function  Pelvic Pain baseline - Lower score = better function  Bowel  constipation baseline -   Higher score = better function  PFDI Bowel - Higher score = better function  Lumber baseline  -  Higher score = better function   Goal status: INITIAL  3.  Pt will demo proper body mechanics in against gravity tasks and ADLs  work tasks, fitness  to minimize straining pelvic floor / back    Baseline: not IND, improper form that places strain on pelvic floor  Goal status: INITIAL    4. Pt  will demo increased gait speed > 1.4 m/s with reciprocal gait pattern, longer stride length  in order to ambulate safely in community and return to fitness routine  Baseline: R shoulder. iliac crest lowered , 1.29 m/s, limited posterior rotation of R shoulder and pelvis , excessive sway to L  Goal status: INITIAL    5.   Baseline:  6/10 pain after standing to cook for > 10 min.   Goal status: INITIAL   6. Pt will regain shoulder flexion to > 135 deg and reach behind head is with digit III at T1 in order to fix her hair and to able to fold laundry with lateral movement to the R, reaching up to put away dishes, to do hair, turning steering wheel in car  Baseline: Shoulder 90 deg flexion R, L 135 deg  , hands behind head digit III at top of ear , pain with able to fold laundry with lateral movement to the R, reaching up to put away dishes, to do hair, turning steering wheel in car  Goal status: INITIAL   7. Pt will report increase water intake from 16 fl oz to > 48 fl oz  and decrease bladder irritants ( wine from 10 oz per night to < 5 oz night) , tea 32 fl oz to < 16 fl oz  Baseline:  Daily fluid intake:  16 fl oz water, 32 floz unsweeten tea, no soda, no coffee, 10 oz of wine per night   Goal status: INITIAL   8. Pt will follow with PCP for sleep study to rule in/ out OSA  Baseline: nocturia:  every hours between 11pm to 7am, husband tells  her she snores, pt with Hx high BP, cholesterol   Goal status:Initial

## 2023-02-21 ENCOUNTER — Encounter: Payer: Self-pay | Admitting: Family Medicine

## 2023-02-22 ENCOUNTER — Ambulatory Visit (INDEPENDENT_AMBULATORY_CARE_PROVIDER_SITE_OTHER): Payer: PPO | Admitting: Obstetrics and Gynecology

## 2023-02-22 ENCOUNTER — Encounter: Payer: Self-pay | Admitting: Obstetrics and Gynecology

## 2023-02-22 VITALS — BP 132/78 | HR 65

## 2023-02-22 DIAGNOSIS — R3915 Urgency of urination: Secondary | ICD-10-CM | POA: Diagnosis not present

## 2023-02-22 DIAGNOSIS — R350 Frequency of micturition: Secondary | ICD-10-CM

## 2023-02-22 DIAGNOSIS — N952 Postmenopausal atrophic vaginitis: Secondary | ICD-10-CM | POA: Diagnosis not present

## 2023-02-22 NOTE — Progress Notes (Signed)
[facility] Return Visit  SUBJECTIVE  History of Present Illness: Mercedes [name] is a 77 y.o. female seen in follow-up for Vaginal atrophy and frequency. Plan at last visit was start pelvic floor PT and start using compounded vulvodynia cream.  She is enjoying working with Weber Cooks with pelvic floor PT and believes this will be helpful for her.   She reports good use of the vulvodynia cream and reports this has made things more comfortable for her. States her pain has decreased overall.   Past Medical History: Patient  has a past medical history of Allergy, Cataract, Diverticulosis of colon, Fatty liver, Gout, HLD (hyperlipidemia), HTN (hypertension), Hypothyroidism, Macular degeneration, Osteoarthritis, Rapid heartbeat, and Tobacco abuse.   Past Surgical History: She  has a past surgical history that includes Sigmoidoscopy; Colonoscopy; Cholecystectomy (N/A, 03/17/2014); Cataract extraction; lower back surgery ; and Parathyroidectomy (Left, 08/20/2022).   Medications: She has a current medication list which includes the following prescription(s): ascorbic acid, cholecalciferol, colchicine, cranberry, vabysmo, fluticasone, levothyroxine, levothyroxine, metoprolol, multivitamin with minerals, preservision areds 2, NONFORMULARY OR COMPOUNDED ITEM, omega 3, potassium, pravastatin, psyllium, pumpkin seed-soy germ, turmeric, and zinc gluconate.   Allergies: Patient is allergic to ace inhibitors, clarithromycin, fenofibrate, lipitor [atorvastatin], niacin, penicillins, sulfasalazine, and tetracycline hcl.   Social History: Patient  reports that she quit smoking about 12 years ago. Her smoking use included cigarettes. She started smoking about 27 years ago. She has a 3.8 pack-year smoking history. She has never used smokeless tobacco. She reports current alcohol use. She reports that she does not use drugs.      OBJECTIVE     Physical Exam: Vitals:   02/22/23 1018  BP:  132/78  Pulse: 65   Gen: No apparent distress, A&O x 3.  Detailed Urogynecologic Evaluation:  Deferred.       ASSESSMENT AND PLAN    Mercedes [name] is a 77 y.o. with:  1. Urinary frequency   2. Vaginal atrophy   3. Urinary urgency    Patient is working on her urinary frequency with pelvic floor PT. She is set to do pelvic floor PT through October.  Patient is using coconut oil but it gets messy. Can consider taking the coconut oil and putting it in small ice cube containers and making her own suppositories, or use vitamin e capsules and put a hole in them with a pin and squeeze it into the vagina.  Patient will plan to  follow up after pelvic floor PT and if needed we can explore medication options for her leakage.   Patient to follow up in November for urgency follow up.

## 2023-02-26 ENCOUNTER — Ambulatory Visit: Payer: PPO | Admitting: Physical Therapy

## 2023-02-26 DIAGNOSIS — R278 Other lack of coordination: Secondary | ICD-10-CM

## 2023-02-26 DIAGNOSIS — R2689 Other abnormalities of gait and mobility: Secondary | ICD-10-CM | POA: Diagnosis not present

## 2023-02-26 DIAGNOSIS — G8929 Other chronic pain: Secondary | ICD-10-CM

## 2023-02-26 DIAGNOSIS — M5459 Other low back pain: Secondary | ICD-10-CM

## 2023-02-26 DIAGNOSIS — M533 Sacrococcygeal disorders, not elsewhere classified: Secondary | ICD-10-CM

## 2023-02-26 NOTE — Therapy (Addendum)
OUTPATIENT PHYSICAL THERAPY TREATMENT    Patient Name: Mercedes [name] MRN: [id number] DOB:[birth year], 77 y.o., female Today's Date: 02/26/2023   PT End of Session - 02/26/23 1417     Visit Number 2    Number of Visits 10    Date for PT Re-Evaluation 04/30/23    PT Start Time 1418    PT Stop Time 1500    PT Time Calculation (min) 42 min    Activity Tolerance Patient tolerated treatment well;No increased pain    Behavior During Therapy WFL for tasks assessed/performed             Past Medical History:  Diagnosis Date   Allergy    Cataract    bilateral-not an issue right now   Diverticulosis of colon    Fatty liver    Gout    HLD (hyperlipidemia)    under control   HTN (hypertension)    Hypothyroidism    Macular degeneration    Osteoarthritis    Rapid heartbeat    occasional, does not last long   Tobacco abuse    Past Surgical History:  Procedure Laterality Date   CATARACT EXTRACTION     CHOLECYSTECTOMY N/A 03/17/2014   Procedure: LAPAROSCOPIC CHOLECYSTECTOMY WITH INTRAOPERATIVE CHOLANGIOGRAM ;  Surgeon: Claud Kelp, MD;  Location: [facility];  Service: General;  Laterality: N/A;   COLONOSCOPY     removed polyps   lower back surgery      PARATHYROIDECTOMY Left 08/20/2022   Procedure: LEFT PARATHYROIDECTOMY;  Surgeon: Darnell Level, MD;  Location: [facility];  Service: General;  Laterality: Left;   SIGMOIDOSCOPY     Patient Active Problem List   Diagnosis Date Noted   Paresthesia 12/16/2022   Right shoulder pain 12/16/2022   Acute recurrent cystitis 07/04/2022   Spondylolisthesis of lumbar region 05/21/2022   Elevated hemoglobin (HCC) 11/07/2021   Swelling of right index finger 11/06/2021   Vaginal discharge 01/06/2021   Urinary frequency 01/06/2021   Hyperparathyroidism, primary (HCC) 12/11/2020   Health care maintenance 01/23/2018   Macular degeneration 01/23/2018   Low back pain 12/29/2015   Advance care planning 12/14/2014   Vitamin D deficiency  12/14/2014   Impacted cerumen of left ear 01/29/2014   Hypercalcemia 12/01/2013   Hyperglycemia 10/02/2012   Medicare annual wellness visit, subsequent 10/02/2012   Leg pain 07/09/2012   Upper respiratory tract infection 11/03/2010   Gout    HLD (hyperlipidemia)    Hypothyroidism 01/10/2007   HYPERTENSION, BENIGN ESSENTIAL 01/10/2007   MENOPAUSAL SYNDROME 01/10/2007   OSTEOARTHRITIS 01/10/2007    PCP: Para March   REFERRING PROVIDER: Cordelia Pen   REFERRING DIAG: urinary frequency and urgency   Rationale for Evaluation and Treatment Rehabilitation  THERAPY DIAG:  Other abnormalities of gait and mobility  Sacrococcygeal disorders, not elsewhere classified  Other lack of coordination  Other low back pain  Chronic right shoulder pain  ONSET DATE:   SUBJECTIVE:      SUBJECTIVE STATEMENT TODAY:  Pt has increased water and decreased tea.   SUBJECTIVE STATEMENT ON EVAL 02/19/23: 1) urinary frequency and urgency:   pt goes to the toilet once every 2 hours during day, every hour at night ( bedtime 11pm, wakes up 7am, drink wine 7pm, pt does not take any direutics) . Pt has to urinate again after leaving house 20 -30 min. Daily fluid intake:  16 fl oz water, 32 floz unsweeten tea, no soda, no coffee, 10 oz of wine per night.  SUI with sneezing   2) CLBP :  Hx of spinal fusion 2023. 6/10 pain after standing to cook for > 10 min.  Denied radiating pain   3) R shoulder pain:  since a fall in Jan 2024:  not able to fold laundry with lateral movement to the R, reaching up to put away dishes, to do hair, turning steering wheel in car  occurs with 4/10 . Denied radiating . Sleeps on both sides but no pillow between knees   PERTINENT HISTORY:    PAIN:  Are you having pain? Yes: see above   PRECAUTIONS:  None  WEIGHT BEARING RESTRICTIONS: No  FALLS:  Has patient fallen in last 6 months? No  LIVING ENVIRONMENT: Lives with: lives with their spouse Lives in: house  Stairs: 3 STE with rail  Has following equipment at home: No  OCCUPATION: Programmer, systems , hobbies: reading, cooking, going out with friends, card games, sewing   PLOF: Independent  PATIENT GOALS:  Pain free in shoulder, resume lifestyles includes walking , going to gym, participating in social function    OBJECTIVE:   [facility] PT Assessment - 02/26/23 1502       Palpation   SI assessment  pre Tx: R shoudler higher, L iliac crest higher,  B pelvic levelled, R shoulder slightly higher    Palpation comment C/T junction hypombilty, occiput tightness L> R,  upper trap. scalenes B,  scapular mm at L UE ,             [facility] Adult PT Treatment/Exercise - 02/26/23 1501       Therapeutic Activites    Other Therapeutic Activities cued for sit <> supine to minimize straining neck and pelvic floor      Neuro Re-ed    Neuro Re-ed Details  cued for cervical AROM and L scapular mobility HEP      Modalities   Modalities Moist Heat      Moist Heat Therapy   Number Minutes Moist Heat 5 Minutes    Moist Heat Location --   supported knees, neck, (unbilled)     Manual Therapy   Manual therapy comments STM/MWM, distraction at problem areas noted in assessment to promote C/T mobility               HOME EXERCISE PROGRAM: See pt instruction section    ASSESSMENT:  CLINICAL IMPRESSION:  Following Tx today which pt tolerated without complaints,  pt demo'd increased mobility at C/T junction and more levelled shoulders but R shoulder flexion AROM still limited.  Anticipate deviation of C/T junction is contributing to shoulder issues.  Once spinal and pelvic alignment are corrected, plan to progress to deep core training to help with CLBP, pelvic issues.   Plan to ask about increasing water intake and decrease tea ( bladder  irritants).    Plan to address pelvic floor issues once pelvis and spine are realigned to yield better outcomes.     Pt benefits from skilled PT.  OBJECTIVE IMPAIRMENTS decreased activity tolerance, decreased coordination, decreased endurance, decreased mobility, difficulty walking, decreased ROM, decreased strength, decreased safety awareness, hypomobility, increased muscle spasms, impaired flexibility, improper body mechanics, postural dysfunction, and pain. scar restrictions   ACTIVITY LIMITATIONS  self-care,  sleep, home chores, work tasks    PARTICIPATION LIMITATIONS:  community, ADLs, activities    PERSONAL FACTORS       are also affecting patient's functional outcome.    REHAB POTENTIAL: Good   CLINICAL DECISION MAKING: Evolving/moderate complexity   EVALUATION COMPLEXITY: Moderate    PATIENT EDUCATION:    Education details: Showed pt anatomy images. Explained muscles attachments/ connection, physiology of deep core system/ spinal- thoracic-pelvis-lower kinetic chain as they relate to pt's presentation, Sx, and past Hx. Explained what and how these areas of deficits need to be restored to balance and function    See Therapeutic activity / neuromuscular re-education section  Answered pt's questions.   Person educated: Patient Education method: Explanation, Demonstration, Tactile cues, Verbal cues, and Handouts Education comprehension: verbalized understanding, returned demonstration, verbal cues required, tactile cues required, and needs further education     PLAN: PT FREQUENCY: 1x/week   PT DURATION: 10 weeks   PLANNED INTERVENTIONS: Therapeutic exercises, Therapeutic activity, Neuromuscular re-education, Balance training, Gait training, Patient/Family education, Self Care, Joint mobilization, Spinal mobilization, Moist heat, Taping, and Manual therapy, dry needling.   PLAN FOR NEXT SESSION: See clinical impression for plan     GOALS: Goals reviewed with  patient? Yes  SHORT TERM GOALS: Target date: 03/19/2023    Pt will demo IND with HEP                    Baseline: Not IND            Goal status: INITIAL   LONG TERM GOALS: Target date: 04/30/2023    1.Pt will demo proper deep core coordination without chest breathing and optimal excursion of diaphragm/pelvic floor in order to promote spinal stability and pelvic floor function  Baseline: dyscoordination Goal status: INITIAL  2.  Pt will demo > 5 pt change on FOTO  to improve QOL and function  PFDI Urinary baseline - 29 Lower score = better function    PFDI Prolapse  29 Lower score = better function   Pelvic Pain baseline - 8 Lower score = better function    PFDI Bowel -33 Higher score = better function  Lumber baseline  - 50 Higher score = better function   Goal status: INITIAL  3.  Pt will demo proper body mechanics in against gravity tasks and ADLs  work tasks, fitness  to minimize straining pelvic floor / back    Baseline: not IND, improper form that places strain on pelvic floor  Goal status: INITIAL    4. Pt will demo increased gait speed > 1.4 m/s with reciprocal gait pattern, longer stride length  in order to ambulate safely in community and return to fitness routine  Baseline: R shoulder. iliac crest lowered , 1.29 m/s, limited posterior rotation of R shoulder and pelvis , excessive sway to L  Goal status: INITIAL    5.   Baseline:  6/10 pain after standing to cook for > 10 min.   Goal status: INITIAL   6. Pt will regain shoulder flexion to > 135 deg and reach behind head is with digit III at T1 in order to fix her hair and to able to fold laundry with lateral movement to the R, reaching up  to put away dishes, to do hair, turning steering wheel in car  Baseline: Shoulder 90 deg flexion R, L 135 deg  , hands behind head digit III at top of ear , pain with able to fold laundry with lateral movement to the R, reaching up to put away dishes, to do hair,  turning steering wheel in car  Goal status: INITIAL   7. Pt will report increase water intake from 16 fl oz to > 48 fl oz  and decrease bladder irritants ( wine from 10 oz per night to < 5 oz night) , tea 32 fl oz to < 16 fl oz  Baseline:  Daily fluid intake:  16 fl oz water, 32 floz unsweeten tea, no soda, no coffee, 10 oz of wine per night   Goal status: INITIAL   8. Pt will follow with PCP for sleep study to rule in/ out OSA  Baseline: nocturia:  every hours between 11pm to 7am, husband tells her she snores, pt with Hx high BP, cholesterol   Goal status:Initial    Mariane Masters, PT, DPT, E-RYT Pelvic Health Physical Therapist [email or web address] [phone]

## 2023-02-26 NOTE — Patient Instructions (Signed)
   Neck / shoulder stretches:    Lying on back - small sushi roll towel under neck  _ 6 directions  5 reps   __   Avoid straining pelvic floor, abdominal muscles , spine  Use log rolling technique instead of getting out of bed with your neck or the sit-up     Log rolling into and out of bed   Log rolling into and out of bed If getting out of bed on R side, Bent knees, scoot hips/ shoulder to L  Raise R arm completely overhead, rolling onto armpit  Then lower bent knees to bed to get into complete side lying position  Then drop legs off bed, and push up onto R elbow/forearm, and use L hand to push onto the bed    Dig elbows and feet to lift hte buttocks and scoot without lifting head  '

## 2023-03-05 ENCOUNTER — Encounter: Payer: Self-pay | Admitting: Obstetrics and Gynecology

## 2023-03-05 ENCOUNTER — Other Ambulatory Visit (HOSPITAL_COMMUNITY)
Admission: RE | Admit: 2023-03-05 | Discharge: 2023-03-05 | Disposition: A | Discharge status: Home / Self Care | Payer: PPO | Source: Other Acute Inpatient Hospital | Attending: Obstetrics and Gynecology | Admitting: Obstetrics and Gynecology

## 2023-03-05 ENCOUNTER — Ambulatory Visit (INDEPENDENT_AMBULATORY_CARE_PROVIDER_SITE_OTHER): Payer: PPO | Admitting: Obstetrics and Gynecology

## 2023-03-05 VITALS — BP 160/82 | HR 80

## 2023-03-05 DIAGNOSIS — R82998 Other abnormal findings in urine: Secondary | ICD-10-CM | POA: Diagnosis not present

## 2023-03-05 DIAGNOSIS — H353221 Exudative age-related macular degeneration, left eye, with active choroidal neovascularization: Secondary | ICD-10-CM | POA: Diagnosis not present

## 2023-03-05 DIAGNOSIS — R319 Hematuria, unspecified: Secondary | ICD-10-CM | POA: Diagnosis not present

## 2023-03-05 DIAGNOSIS — R350 Frequency of micturition: Secondary | ICD-10-CM | POA: Diagnosis not present

## 2023-03-05 DIAGNOSIS — N390 Urinary tract infection, site not specified: Secondary | ICD-10-CM | POA: Diagnosis not present

## 2023-03-05 LAB — POCT URINALYSIS DIPSTICK
Bilirubin, UA: NEGATIVE
Glucose, UA: NEGATIVE
Ketones, UA: NEGATIVE
Nitrite, UA: NEGATIVE
Protein, UA: POSITIVE — AB
Spec Grav, UA: >=1.03 — AB (ref 1.010–1.025)
Urobilinogen, UA: 0.2 U/dL
pH, UA: 6 (ref 5.0–8.0)

## 2023-03-05 LAB — URINALYSIS, ROUTINE W REFLEX MICROSCOPIC
Bilirubin Urine: NEGATIVE
Glucose, UA: NEGATIVE mg/dL
Ketones, ur: NEGATIVE mg/dL
Nitrite: NEGATIVE
Protein, ur: 100 mg/dL — AB
RBC / HPF: >50 RBC/hpf (ref 0–5)
Specific Gravity, Urine: 1.02 (ref 1.005–1.030)
WBC, UA: >50 WBC/hpf (ref 0–5)
pH: 5 (ref 5.0–8.0)

## 2023-03-05 MED ORDER — FOSFOMYCIN TROMETHAMINE 3 G PO PACK
3.0000 g | PACK | Freq: Once | ORAL | 0 refills | Status: AC
Start: 2023-03-05 — End: 2023-03-05

## 2023-03-05 NOTE — Progress Notes (Signed)
[facility] Return Visit  SUBJECTIVE  History of Present Illness: Mercedes [name] is a 77 y.o. female seen in follow-up for bladder irritation.    Past Medical History: Patient  has a past medical history of Allergy, Cataract, Diverticulosis of colon, Fatty liver, Gout, HLD (hyperlipidemia), HTN (hypertension), Hypothyroidism, Macular degeneration, Osteoarthritis, Rapid heartbeat, and Tobacco abuse.   Past Surgical History: She  has a past surgical history that includes Sigmoidoscopy; Colonoscopy; Cholecystectomy (N/A, 03/17/2014); Cataract extraction; lower back surgery ; and Parathyroidectomy (Left, 08/20/2022).   Medications: She has a current medication list which includes the following prescription(s): fosfomycin, ascorbic acid, cholecalciferol, colchicine, cranberry, vabysmo, fluticasone, levothyroxine, levothyroxine, metoprolol, multivitamin with minerals, preservision areds 2, NONFORMULARY OR COMPOUNDED ITEM, omega 3, potassium, pravastatin, psyllium, pumpkin seed-soy germ, turmeric, and zinc gluconate.   Allergies: Patient is allergic to ace inhibitors, clarithromycin, fenofibrate, lipitor [atorvastatin], niacin, penicillins, sulfasalazine, and tetracycline hcl.   Social History: Patient  reports that she quit smoking about 12 years ago. Her smoking use included cigarettes. She started smoking about 27 years ago. She has a 3.8 pack-year smoking history. She has never used smokeless tobacco. She reports current alcohol use. She reports that she does not use drugs.      OBJECTIVE     Physical Exam: Vitals:   03/05/23 1531  BP: (!) 160/82  Pulse: 80   Gen: No apparent distress, A&O x 3.  Detailed Urogynecologic Evaluation:  No obvious sites of bleeding. No areas of irritation.     ASSESSMENT AND PLAN    Mercedes [name] is a 77 y.o. with:  1. Urinary frequency   2. Leukocytes in urine   3. Hematuria, unspecified type     Patient has positive protein,  moderate leukocytes, and large blood in her urine sample today. Will send in a dose of Fosfomycin for her. Considering doing a prophylactic course of antibiotics. She cannot have penicillins or sulfa antibiotics and has had an intermediate resistant culture to nitrofurantoin.  Will send urine for culture.  Will send urine for micro.   Patient to follow up PRN.

## 2023-03-06 ENCOUNTER — Ambulatory Visit: Payer: PPO | Attending: Obstetrics and Gynecology | Admitting: Physical Therapy

## 2023-03-06 DIAGNOSIS — M5459 Other low back pain: Secondary | ICD-10-CM | POA: Diagnosis not present

## 2023-03-06 DIAGNOSIS — R278 Other lack of coordination: Secondary | ICD-10-CM

## 2023-03-06 DIAGNOSIS — M533 Sacrococcygeal disorders, not elsewhere classified: Secondary | ICD-10-CM | POA: Diagnosis not present

## 2023-03-06 DIAGNOSIS — R2689 Other abnormalities of gait and mobility: Secondary | ICD-10-CM | POA: Diagnosis not present

## 2023-03-06 DIAGNOSIS — G8929 Other chronic pain: Secondary | ICD-10-CM

## 2023-03-06 DIAGNOSIS — M25511 Pain in right shoulder: Secondary | ICD-10-CM | POA: Diagnosis not present

## 2023-03-06 MED ORDER — NITROFURANTOIN MONOHYD MACRO 100 MG PO CAPS
100.0000 mg | ORAL_CAPSULE | Freq: Every day | ORAL | 1 refills | Status: DC
Start: 2023-03-06 — End: 2023-03-12

## 2023-03-06 NOTE — Therapy (Unsigned)
OUTPATIENT PHYSICAL THERAPY TREATMENT    Patient Name: Mercedes [name] MRN: [id number] DOB:Feb 22, 1946, 77 y.o., female Today's Date: 03/06/2023   PT End of Session - 03/06/23 1111     Visit Number 3    Number of Visits 10    Date for PT Re-Evaluation 04/30/23    PT Start Time 1108    PT Stop Time 1148    PT Time Calculation (min) 40 min    Activity Tolerance Patient tolerated treatment well;No increased pain    Behavior During Therapy WFL for tasks assessed/performed             Past Medical History:  Diagnosis Date   Allergy    Cataract    bilateral-not an issue right now   Diverticulosis of colon    Fatty liver    Gout    HLD (hyperlipidemia)    under control   HTN (hypertension)    Hypothyroidism    Macular degeneration    Osteoarthritis    Rapid heartbeat    occasional, does not last long   Tobacco abuse    Past Surgical History:  Procedure Laterality Date   CATARACT EXTRACTION     CHOLECYSTECTOMY N/A 03/17/2014   Procedure: LAPAROSCOPIC CHOLECYSTECTOMY WITH INTRAOPERATIVE CHOLANGIOGRAM ;  Surgeon: Claud Kelp, MD;  Location: [facility];  Service: General;  Laterality: N/A;   COLONOSCOPY     removed polyps   lower back surgery      PARATHYROIDECTOMY Left 08/20/2022   Procedure: LEFT PARATHYROIDECTOMY;  Surgeon: Darnell Level, MD;  Location: [facility];  Service: General;  Laterality: Left;   SIGMOIDOSCOPY     Patient Active Problem List   Diagnosis Date Noted   Paresthesia 12/16/2022   Right shoulder pain 12/16/2022   Acute recurrent cystitis 07/04/2022   Spondylolisthesis of lumbar region 05/21/2022   Elevated hemoglobin (HCC) 11/07/2021   Swelling of right index finger 11/06/2021   Vaginal discharge 01/06/2021   Urinary frequency 01/06/2021   Hyperparathyroidism, primary (HCC) 12/11/2020   Health care maintenance 01/23/2018   Macular degeneration 01/23/2018   Low back pain 12/29/2015   Advance care planning 12/14/2014   Vitamin D deficiency  12/14/2014   Impacted cerumen of left ear 01/29/2014   Hypercalcemia 12/01/2013   Hyperglycemia 10/02/2012   Medicare annual wellness visit, subsequent 10/02/2012   Leg pain 07/09/2012   Upper respiratory tract infection 11/03/2010   Gout    HLD (hyperlipidemia)    Hypothyroidism 01/10/2007   HYPERTENSION, BENIGN ESSENTIAL 01/10/2007   MENOPAUSAL SYNDROME 01/10/2007   OSTEOARTHRITIS 01/10/2007    PCP: Para March   REFERRING PROVIDER: Cordelia Pen   REFERRING DIAG: urinary frequency and urgency   Rationale for Evaluation and Treatment Rehabilitation  THERAPY DIAG:  Other abnormalities of gait and mobility  Sacrococcygeal disorders, not elsewhere classified  Other lack of coordination  Other low back pain  Chronic right shoulder pain  ONSET DATE:   SUBJECTIVE:      SUBJECTIVE STATEMENT TODAY:    Pt reports she felt sore after last session in her neck and back area. Pt continues to decreased tea and increased water.  Pt has an UTI  which she has been dealing recurrent UTI over a year  SUBJECTIVE STATEMENT ON EVAL 02/19/23: 1) urinary frequency and urgency:   pt goes to the toilet once every 2 hours during day, every hour at night ( bedtime 11pm, wakes up 7am, drink wine 7pm, pt does not take any direutics) . Pt has to urinate again after leaving house 20 -30 min. Daily fluid intake:  16 fl oz water, 32 floz unsweeten tea, no soda, no coffee, 10 oz of wine per night.  SUI with sneezing   2) CLBP :  Hx of spinal fusion 2023. 6/10 pain after standing to cook for > 10 min.  Denied radiating pain   3) R shoulder pain:  since a fall in Jan 2024:  not able to fold laundry with lateral movement to the R, reaching up to put away dishes, to do hair, turning steering wheel in car  occurs with 4/10 . Denied radiating . Sleeps on  both sides but no pillow between knees   PERTINENT HISTORY:    PAIN:  Are you having pain? Yes: see above   PRECAUTIONS: None  WEIGHT BEARING RESTRICTIONS: No  FALLS:  Has patient fallen in last 6 months? No  LIVING ENVIRONMENT: Lives with: lives with their spouse Lives in: house  Stairs: 3 STE with rail  Has following equipment at home: No  OCCUPATION: Programmer, systems , hobbies: reading, cooking, going out with friends, card games, sewing   PLOF: Independent  PATIENT GOALS:  Pain free in shoulder, resume lifestyles includes walking , going to gym, participating in social function    OBJECTIVE:         HOME EXERCISE PROGRAM: See pt instruction section    ASSESSMENT:  CLINICAL IMPRESSION:  Following Tx today which pt tolerated without complaints,  pt demo'd increased mobility at C/T junction and more levelled shoulders but R shoulder flexion AROM still limited.  Anticipate deviation of C/T junction is contributing to shoulder issues.  Once spinal and pelvic alignment are corrected, plan to progress to deep core training to help with CLBP, pelvic issues.   Plan to ask about increasing water intake and decrease tea ( bladder irritants).    Plan to address pelvic floor issues once pelvis and spine are realigned to yield better outcomes.     Pt benefits from skilled PT.    OBJECTIVE IMPAIRMENTS decreased activity tolerance, decreased coordination, decreased endurance, decreased mobility, difficulty walking, decreased ROM, decreased strength, decreased safety awareness, hypomobility, increased muscle spasms, impaired flexibility, improper body mechanics, postural dysfunction, and pain. scar restrictions   ACTIVITY LIMITATIONS  self-care,  sleep, home chores, work tasks    PARTICIPATION LIMITATIONS:  community, ADLs, activities    PERSONAL FACTORS       are also affecting patient's functional outcome.    REHAB POTENTIAL: Good   CLINICAL DECISION MAKING:  Evolving/moderate complexity   EVALUATION COMPLEXITY: Moderate    PATIENT EDUCATION:    Education details: Showed pt anatomy images. Explained muscles attachments/ connection, physiology of deep core system/ spinal- thoracic-pelvis-lower kinetic chain as they relate to pt's presentation, Sx, and past Hx. Explained what and how these areas of deficits need to be restored to balance and function    See Therapeutic activity / neuromuscular re-education section  Answered pt's questions.   Person educated: Patient Education method: Explanation, Demonstration, Tactile cues, Verbal cues, and Handouts Education comprehension: verbalized understanding, returned demonstration, verbal cues required, tactile cues required, and needs further education     PLAN: PT FREQUENCY: 1x/week   PT DURATION: 10 weeks   PLANNED  INTERVENTIONS: Therapeutic exercises, Therapeutic activity, Neuromuscular re-education, Balance training, Gait training, Patient/Family education, Self Care, Joint mobilization, Spinal mobilization, Moist heat, Taping, and Manual therapy, dry needling.   PLAN FOR NEXT SESSION: See clinical impression for plan     GOALS: Goals reviewed with patient? Yes  SHORT TERM GOALS: Target date: 03/19/2023    Pt will demo IND with HEP                    Baseline: Not IND            Goal status: INITIAL   LONG TERM GOALS: Target date: 04/30/2023    1.Pt will demo proper deep core coordination without chest breathing and optimal excursion of diaphragm/pelvic floor in order to promote spinal stability and pelvic floor function  Baseline: dyscoordination Goal status: INITIAL  2.  Pt will demo > 5 pt change on FOTO  to improve QOL and function  PFDI Urinary baseline - 29 Lower score = better function    PFDI Prolapse  29 Lower score = better function   Pelvic Pain baseline - 8 Lower score = better function    PFDI Bowel -33 Higher score = better function  Lumber  baseline  - 50 Higher score = better function   Goal status: INITIAL  3.  Pt will demo proper body mechanics in against gravity tasks and ADLs  work tasks, fitness  to minimize straining pelvic floor / back    Baseline: not IND, improper form that places strain on pelvic floor  Goal status: INITIAL    4. Pt will demo increased gait speed > 1.4 m/s with reciprocal gait pattern, longer stride length  in order to ambulate safely in community and return to fitness routine  Baseline: R shoulder. iliac crest lowered , 1.29 m/s, limited posterior rotation of R shoulder and pelvis , excessive sway to L  Goal status: INITIAL    5.   Baseline:  6/10 pain after standing to cook for > 10 min.   Goal status: INITIAL   6. Pt will regain shoulder flexion to > 135 deg and reach behind head is with digit III at T1 in order to fix her hair and to able to fold laundry with lateral movement to the R, reaching up to put away dishes, to do hair, turning steering wheel in car  Baseline: Shoulder 90 deg flexion R, L 135 deg  , hands behind head digit III at top of ear , pain with able to fold laundry with lateral movement to the R, reaching up to put away dishes, to do hair, turning steering wheel in car  Goal status: INITIAL   7. Pt will report increase water intake from 16 fl oz to > 48 fl oz  and decrease bladder irritants ( wine from 10 oz per night to < 5 oz night) , tea 32 fl oz to < 16 fl oz  Baseline:  Daily fluid intake:  16 fl oz water, 32 floz unsweeten tea, no soda, no coffee, 10 oz of wine per night   Goal status: INITIAL   8. Pt will follow with PCP for sleep study to rule in/ out OSA  Baseline: nocturia:  every hours between 11pm to 7am, husband tells her she snores, pt with Hx high BP, cholesterol   Goal status:Initial    Mariane Masters, PT, DPT, E-RYT Pelvic Health Physical Therapist [email or web address] [phone]

## 2023-03-06 NOTE — Addendum Note (Signed)
Addended by: Selmer Dominion on: 03/06/2023 03:23 PM   Modules accepted: Orders

## 2023-03-06 NOTE — Patient Instructions (Signed)
? ?  Neck / shoulder stretches: ? ?  ?Lying on back - small sushi roll towel under neck  ?_ 6 directions  ?5 reps  ? ?_angel wings, lower elbows down , keep arms touching bed  ?10 reps  ? ?____ ?

## 2023-03-07 ENCOUNTER — Ambulatory Visit: Payer: PPO | Admitting: Physical Therapy

## 2023-03-07 LAB — URINE CULTURE: Culture: 80000 — AB

## 2023-03-11 ENCOUNTER — Encounter: Payer: Self-pay | Admitting: Obstetrics and Gynecology

## 2023-03-12 ENCOUNTER — Other Ambulatory Visit: Payer: Self-pay | Admitting: Obstetrics and Gynecology

## 2023-03-12 DIAGNOSIS — N390 Urinary tract infection, site not specified: Secondary | ICD-10-CM

## 2023-03-12 MED ORDER — CEPHALEXIN 250 MG PO CAPS: 250.0000 mg | ORAL_CAPSULE | Freq: Every day | ORAL | 1 refills | Status: DC

## 2023-03-12 NOTE — Progress Notes (Signed)
Patient called and had developed a rash on her arms and legs after taking the Macrobid. She has multiple allergies including Penicillins, Sulfa, and Clarithromycin. Most the antibiotics for UTI prophylaxis fall under one of those categories for prophylaxis.  Preferred second line agents also fall into one of the above listed allergen category. She has taken Keflex in the past. Will start her on Keflex 240mg  daily for prophylaxis. Patient is in agreement with this plan of care.

## 2023-03-14 ENCOUNTER — Ambulatory Visit: Payer: PPO | Admitting: Physical Therapy

## 2023-03-14 DIAGNOSIS — R278 Other lack of coordination: Secondary | ICD-10-CM

## 2023-03-14 DIAGNOSIS — R2689 Other abnormalities of gait and mobility: Secondary | ICD-10-CM | POA: Diagnosis not present

## 2023-03-14 DIAGNOSIS — M533 Sacrococcygeal disorders, not elsewhere classified: Secondary | ICD-10-CM

## 2023-03-14 DIAGNOSIS — G8929 Other chronic pain: Secondary | ICD-10-CM

## 2023-03-14 DIAGNOSIS — M5459 Other low back pain: Secondary | ICD-10-CM

## 2023-03-14 NOTE — Patient Instructions (Addendum)
Knee to chest with bathroom belt under the thigh, other knee bend, foot stable   Inhale, exhale pull on exhale  10 reps each leg   __   Against wall, R fist against wall at rib level, backward circles  10 reps  X 3

## 2023-03-14 NOTE — Therapy (Signed)
OUTPATIENT PHYSICAL THERAPY TREATMENT    Patient Name: Mercedes [name] MRN: [id number] DOB:January 12, 1946, 77 y.o., female Today's Date: 03/14/2023   PT End of Session - 03/14/23 1438     Visit Number 4    Number of Visits 10    Date for PT Re-Evaluation 04/30/23    PT Start Time 1420    PT Stop Time 1500    PT Time Calculation (min) 40 min    Activity Tolerance Patient tolerated treatment well;No increased pain    Behavior During Therapy WFL for tasks assessed/performed             Past Medical History:  Diagnosis Date   Allergy    Cataract    bilateral-not an issue right now   Diverticulosis of colon    Fatty liver    Gout    HLD (hyperlipidemia)    under control   HTN (hypertension)    Hypothyroidism    Macular degeneration    Osteoarthritis    Rapid heartbeat    occasional, does not last long   Tobacco abuse    Past Surgical History:  Procedure Laterality Date   CATARACT EXTRACTION     CHOLECYSTECTOMY N/A 03/17/2014   Procedure: LAPAROSCOPIC CHOLECYSTECTOMY WITH INTRAOPERATIVE CHOLANGIOGRAM ;  Surgeon: Claud Kelp, MD;  Location: [facility];  Service: General;  Laterality: N/A;   COLONOSCOPY     removed polyps   lower back surgery      PARATHYROIDECTOMY Left 08/20/2022   Procedure: LEFT PARATHYROIDECTOMY;  Surgeon: Darnell Level, MD;  Location: [facility];  Service: General;  Laterality: Left;   SIGMOIDOSCOPY     Patient Active Problem List   Diagnosis Date Noted   Paresthesia 12/16/2022   Right shoulder pain 12/16/2022   Acute recurrent cystitis 07/04/2022   Spondylolisthesis of lumbar region 05/21/2022   Elevated hemoglobin (HCC) 11/07/2021   Swelling of right index finger 11/06/2021   Vaginal discharge 01/06/2021   Urinary frequency 01/06/2021   Hyperparathyroidism, primary (HCC) 12/11/2020   Health care maintenance 01/23/2018   Macular degeneration 01/23/2018   Low back pain 12/29/2015   Advance care planning 12/14/2014   Vitamin D deficiency  12/14/2014   Impacted cerumen of left ear 01/29/2014   Hypercalcemia 12/01/2013   Hyperglycemia 10/02/2012   Medicare annual wellness visit, subsequent 10/02/2012   Leg pain 07/09/2012   Upper respiratory tract infection 11/03/2010   Gout    HLD (hyperlipidemia)    Hypothyroidism 01/10/2007   HYPERTENSION, BENIGN ESSENTIAL 01/10/2007   MENOPAUSAL SYNDROME 01/10/2007   OSTEOARTHRITIS 01/10/2007    PCP: Para March   REFERRING PROVIDER: Cordelia Pen   REFERRING DIAG: urinary frequency and urgency   Rationale for Evaluation and Treatment Rehabilitation  THERAPY DIAG:  Other abnormalities of gait and mobility  Sacrococcygeal disorders, not elsewhere classified  Other lack of coordination  Other low back pain  Chronic right shoulder pain  ONSET DATE:   SUBJECTIVE:      SUBJECTIVE STATEMENT TODAY:    Pt reports she is still not able to raise her arm up for shampooing and doing her hair. Pt has been doing her neck and shoulder .  HEP.  SUBJECTIVE STATEMENT ON EVAL 02/19/23: 1) urinary frequency and urgency:   pt goes to the toilet once every 2 hours during day, every hour at night ( bedtime 11pm, wakes up 7am, drink wine 7pm, pt does not take any direutics) . Pt has to urinate again after leaving house 20 -30 min. Daily fluid intake:  16 fl oz water, 32 floz unsweeten tea, no soda, no coffee, 10 oz of wine per night.  SUI with sneezing   2) CLBP :  Hx of spinal fusion 2023. 6/10 pain after standing to cook for > 10 min.  Denied radiating pain   3) R shoulder pain:  since a fall in Jan 2024:  not able to fold laundry with lateral movement to the R, reaching up to put away dishes, to do hair, turning steering wheel in car  occurs with 4/10 . Denied radiating . Sleeps on both sides but no pillow between knees   PERTINENT HISTORY:     PAIN:  Are you having pain? Yes: see above   PRECAUTIONS: None  WEIGHT BEARING RESTRICTIONS: No  FALLS:  Has patient fallen in last 6 months? No  LIVING ENVIRONMENT: Lives with: lives with their spouse Lives in: house  Stairs: 3 STE with rail  Has following equipment at home: No  OCCUPATION: Programmer, systems , hobbies: reading, cooking, going out with friends, card games, sewing   PLOF: Independent  PATIENT GOALS:  Pain free in shoulder, resume lifestyles includes walking , going to gym, participating in social function    OBJECTIVE:     [facility] PT Assessment - 03/14/23 1440       Palpation   Spinal mobility hypomobile thoracic, R scapular mm occiput  attachments,    Palpation comment tightness along R  lats             [facility] Adult PT Treatment/Exercise - 03/14/23 1458       Neuro Re-ed    Neuro Re-ed Details  cued for cervical/ scapular mobility HEP, cued for knee to chest stretch with strap, cued and modified RTC strengtheing and scapular moblity HEP      Modalities   Modalities Moist Heat      Moist Heat Therapy   Number Minutes Moist Heat 5 Minutes   5 min unbilled, 10 min billed with instructions for back stretches   Moist Heat Location Shoulder;Cervical   supported knees, neck, (unbilled)     Manual Therapy   Manual therapy comments STM/MWM at areas noted in R UE              HOME EXERCISE PROGRAM: See pt instruction section    ASSESSMENT:  CLINICAL IMPRESSION:    Pt continued to require manual Tx to decrease mm tensions at R posterior back mm and at C/T junction. Pt demo'ed improved scapular mobility post Tx. Explained regaining shoulder abduciton AROM will come before due to pt's areas of hypomobility.  Added isometric rotator cuff strengthening against wall to HEP and modified the position where it was painfree movement for pt.   Added back stretches to minimize back mm tightness. Pt required cues for logrolling to minimize use of cervical  / shoulder mm and minimize back pain/ pelvic floor straining.    Plan to progress to deep core training to help with CLBP, pelvic issues.     Plan to address pelvic floor issues once pelvis and spine are realigned to yield better outcomes.     Pt benefits from skilled PT.  OBJECTIVE IMPAIRMENTS decreased activity tolerance, decreased coordination, decreased endurance, decreased mobility, difficulty walking, decreased ROM, decreased strength, decreased safety awareness, hypomobility, increased muscle spasms, impaired flexibility, improper body mechanics, postural dysfunction, and pain. scar restrictions   ACTIVITY LIMITATIONS  self-care,  sleep, home chores, work tasks    PARTICIPATION LIMITATIONS:  community, ADLs, activities    PERSONAL FACTORS       are also affecting patient's functional outcome.    REHAB POTENTIAL: Good   CLINICAL DECISION MAKING: Evolving/moderate complexity   EVALUATION COMPLEXITY: Moderate    PATIENT EDUCATION:    Education details: Showed pt anatomy images. Explained muscles attachments/ connection, physiology of deep core system/ spinal- thoracic-pelvis-lower kinetic chain as they relate to pt's presentation, Sx, and past Hx. Explained what and how these areas of deficits need to be restored to balance and function    See Therapeutic activity / neuromuscular re-education section  Answered pt's questions.   Person educated: Patient Education method: Explanation, Demonstration, Tactile cues, Verbal cues, and Handouts Education comprehension: verbalized understanding, returned demonstration, verbal cues required, tactile cues required, and needs further education     PLAN: PT FREQUENCY: 1x/week   PT DURATION: 10 weeks   PLANNED INTERVENTIONS: Therapeutic exercises, Therapeutic activity, Neuromuscular re-education, Balance training, Gait training, Patient/Family education, Self Care, Joint mobilization, Spinal mobilization, Moist heat, Taping, and  Manual therapy, dry needling.   PLAN FOR NEXT SESSION: See clinical impression for plan     GOALS: Goals reviewed with patient? Yes  SHORT TERM GOALS: Target date: 03/19/2023    Pt will demo IND with HEP                    Baseline: Not IND            Goal status: INITIAL   LONG TERM GOALS: Target date: 04/30/2023    1.Pt will demo proper deep core coordination without chest breathing and optimal excursion of diaphragm/pelvic floor in order to promote spinal stability and pelvic floor function  Baseline: dyscoordination Goal status: INITIAL  2.  Pt will demo > 5 pt change on FOTO  to improve QOL and function  PFDI Urinary baseline - 29 Lower score = better function    PFDI Prolapse  29 Lower score = better function   Pelvic Pain baseline - 8 Lower score = better function    PFDI Bowel -33 Higher score = better function  Lumber baseline  - 50 Higher score = better function   Goal status: INITIAL  3.  Pt will demo proper body mechanics in against gravity tasks and ADLs  work tasks, fitness  to minimize straining pelvic floor / back    Baseline: not IND, improper form that places strain on pelvic floor  Goal status: INITIAL    4. Pt will demo increased gait speed > 1.4 m/s with reciprocal gait pattern, longer stride length  in order to ambulate safely in community and return to fitness routine  Baseline: R shoulder. iliac crest lowered , 1.29 m/s, limited posterior rotation of R shoulder and pelvis , excessive sway to L  Goal status: INITIAL    5.   Baseline:  6/10 pain after standing to cook for > 10 min.   Goal status: INITIAL   6. Pt will regain shoulder flexion to > 135 deg and reach behind head is with digit III at T1 in order to fix her hair and to able to fold laundry with lateral movement to the R, reaching up  to put away dishes, to do hair, turning steering wheel in car  Baseline: Shoulder 90 deg flexion R, L 135 deg  , hands behind head digit  III at top of ear , pain with able to fold laundry with lateral movement to the R, reaching up to put away dishes, to do hair, turning steering wheel in car  Goal status: INITIAL   7. Pt will report increase water intake from 16 fl oz to > 48 fl oz  and decrease bladder irritants ( wine from 10 oz per night to < 5 oz night) , tea 32 fl oz to < 16 fl oz  Baseline:  Daily fluid intake:  16 fl oz water, 32 floz unsweeten tea, no soda, no coffee, 10 oz of wine per night   Goal status: INITIAL   8. Pt will follow with PCP for sleep study to rule in/ out OSA  Baseline: nocturia:  every hours between 11pm to 7am, husband tells her she snores, pt with Hx high BP, cholesterol   Goal status:Initial    Mariane Masters, PT, DPT, E-RYT Pelvic Health Physical Therapist [email or web address] [phone]

## 2023-03-21 ENCOUNTER — Ambulatory Visit: Payer: PPO | Admitting: Physical Therapy

## 2023-03-28 ENCOUNTER — Encounter: Payer: Self-pay | Admitting: Internal Medicine

## 2023-03-28 ENCOUNTER — Ambulatory Visit: Payer: PPO | Admitting: Physical Therapy

## 2023-03-28 ENCOUNTER — Ambulatory Visit (INDEPENDENT_AMBULATORY_CARE_PROVIDER_SITE_OTHER): Payer: PPO | Admitting: Internal Medicine

## 2023-03-28 VITALS — BP 138/76 | HR 73 | Temp 98.4°F | Ht 64.0 in | Wt 215.0 lb

## 2023-03-28 DIAGNOSIS — U071 COVID-19: Secondary | ICD-10-CM | POA: Insufficient documentation

## 2023-03-28 NOTE — Progress Notes (Signed)
Subjective:    Patient ID: Mercedes [name], female    DOB: [birth year], 77 y.o.   MRN: [id number]  HPI Here due to COVID infection  Having non productive cough--thinks it started almost a week ago Clear runny nose Sneezing No fever, chills or sweats Has lost smell and taste Some ear fullness--both Started with dull headache  No meds for this  Current Outpatient Medications on File Prior to Visit  Medication Sig Dispense Refill   ascorbic acid (VITAMIN C) 500 MG tablet Take 500 mg by mouth daily.     cephALEXin (KEFLEX) 250 MG capsule Take 1 capsule (250 mg total) by mouth daily. 90 capsule 1   cholecalciferol (VITAMIN D) 1000 units tablet Take 1,000 Units by mouth 2 (two) times a day.      colchicine 0.6 MG tablet Take 1 tablet by mouth twice daily as needed 60 tablet 0   CRANBERRY PO Take 1 capsule by mouth in the morning and at bedtime.     faricimab-svoa (VABYSMO) 6 MG/0.05ML SOLN intravitreal injection 0.05 mLs (6 mg total) by Intravitreal route as directed. (Patient taking differently: 6 mg by Intravitreal route as directed. Every 6 weeks) 0.05 mL 0   fluticasone (FLONASE) 50 MCG/ACT nasal spray Place 2 sprays into both nostrils daily. 16 g 0   levothyroxine (SYNTHROID) 200 MCG tablet TAKE 1 TABLET BY MOUTH ONCE DAILY BEFORE  BREAKFAST 90 tablet 0   levothyroxine (SYNTHROID) 25 MCG tablet TAKE 2 TABLETS BY MOUTH ONCE DAILY BEFORE  BREAKFAST  ON  MONDAY  THROUGH  FRIDAYS  -  SKIP  ON  SATURDAY  AND  SUNDAY     metoprolol (TOPROL-XL) 200 MG 24 hr tablet TAKE 1 TABLET BY MOUTH IN THE MORNING 90 tablet 2   Multiple Vitamin (MULTIVITAMIN WITH MINERALS) TABS tablet Take 1 tablet by mouth daily.     Multiple Vitamins-Minerals (PRESERVISION AREDS 2) CAPS Take 1 capsule by mouth 2 (two) times daily.     NONFORMULARY OR COMPOUNDED ITEM Amitriptyline 2.5%/ gabapentin 2.5%/ baclofen 2.5% in vaginal cream.  Place 1g daily in vaginal/ vulvar area.  Dispense 60g with 5 refills. 60 each 5    Omega 3 1000 MG CAPS Take 1,000 mg by mouth in the morning and at bedtime.     Potassium 99 MG TABS Take 99 mg by mouth in the morning and at bedtime.     pravastatin (PRAVACHOL) 40 MG tablet Take 1 tablet by mouth once daily 90 tablet 0   psyllium (REGULOID) 0.52 G capsule Take 1.04 g by mouth daily.     Pumpkin Seed-Soy Germ (AZO BLADDER CONTROL/GO-LESS PO) Take by mouth.     TURMERIC PO Take 750 mg by mouth daily.     zinc gluconate 50 MG tablet Take 50 mg by mouth daily.     No current facility-administered medications on file prior to visit.    Allergies  Allergen Reactions   Ace Inhibitors Cough   Clarithromycin     REACTION: GI upset   Fenofibrate Itching    REACTION: itching- pt unsure   Lipitor [Atorvastatin]     myalgias   Niacin     "burning", made pt pass out   Penicillins     REACTION: Purpura   Sulfasalazine     REACTION: Purpura   Tetracycline Hcl     REACTION: GI upset   Macrobid [Nitrofurantoin] Rash    Petechiae     Past Medical History:  Diagnosis Date  Allergy    Cataract    bilateral-not an issue right now   Diverticulosis of colon    Fatty liver    Gout    HLD (hyperlipidemia)    under control   HTN (hypertension)    Hypothyroidism    Macular degeneration    Osteoarthritis    Rapid heartbeat    occasional, does not last long   Tobacco abuse     Past Surgical History:  Procedure Laterality Date   CATARACT EXTRACTION     CHOLECYSTECTOMY N/A 03/17/2014   Procedure: LAPAROSCOPIC CHOLECYSTECTOMY WITH INTRAOPERATIVE CHOLANGIOGRAM ;  Surgeon: Claud Kelp, MD;  Location: [facility];  Service: General;  Laterality: N/A;   COLONOSCOPY     removed polyps   lower back surgery      PARATHYROIDECTOMY Left 08/20/2022   Procedure: LEFT PARATHYROIDECTOMY;  Surgeon: Darnell Level, MD;  Location: [facility];  Service: General;  Laterality: Left;   SIGMOIDOSCOPY      Family History  Problem Relation Age of Onset   Ulcers Father    Angina Mother     Hypertension Brother    Prostate cancer Brother    Colon cancer Neg Hx    Breast cancer Neg Hx    Esophageal cancer Neg Hx    Stomach cancer Neg Hx    Rectal cancer Neg Hx     Social History   Socioeconomic History   Marital status: Married    Spouse name: Social worker   Number of children: 2   Years of education: Not on file   Highest education level: Master's degree (e.g., MA, MS, MEng, MEd, MSW, MBA)  Occupational History   Occupation: Teacher-retired  Tobacco Use   Smoking status: Former    Current packs/day: 0.00    Average packs/day: 0.3 packs/day for 15.0 years (3.8 ttl pk-yrs)    Types: Cigarettes    Start date: 07/05/1995    Quit date: 07/04/2010    Years since quitting: 12.7   Smokeless tobacco: Never  Vaping Use   Vaping status: Never Used  Substance and Sexual Activity   Alcohol use: Yes    Comment: occas wine   Drug use: No   Sexual activity: Not Currently  Other Topics Concern   Not on file  Social History Narrative   Married 1964   2 kids, in [place].  2 grandkids   Volunteered prev with [employer]   Enjoyed golf but gave it up after her husband had a stroke   Social Determinants of Corporate investment banker Strain: Low Risk  (12/09/2022)   Overall Financial Resource Strain (CARDIA)    Difficulty of Paying Living Expenses: Not hard at all  Food Insecurity: No Food Insecurity (12/09/2022)   Hunger Vital Sign    Worried About Running Out of Food in the Last Year: Never true    Ran Out of Food in the Last Year: Never true  Transportation Needs: No Transportation Needs (12/09/2022)   PRAPARE - Administrator, Civil Service (Medical): No    Lack of Transportation (Non-Medical): No  Physical Activity: Inactive (12/09/2022)   Exercise Vital Sign    Days of Exercise per Week: 0 days    Minutes of Exercise per Session: 0 min  Stress: No Stress Concern Present (12/09/2022)   [employer] of Occupational Health - Occupational Stress Questionnaire     Feeling of Stress : Not at all  Social Connections: Socially Integrated (12/09/2022)   Social Connection and Isolation  Panel [NHANES]    Frequency of Communication with Friends and Family: More than three times a week    Frequency of Social Gatherings with Friends and Family: Once a week    Attends Religious Services: More than 4 times per year    Active Member of [employer] or Organizations: No    Attends Engineer, structural: More than 4 times per year    Marital Status: Married  Catering manager Violence: Not At Risk (12/06/2022)   Humiliation, Afraid, Rape, and Kick questionnaire    Fear of Current or Ex-Partner: No    Emotionally Abused: No    Physically Abused: No    Sexually Abused: No   Review of Systems No N/V Eating okay---just can't taste it Is on keflex as UTI preventative    Objective:   Physical Exam Constitutional:      Appearance: Normal appearance.  HENT:     Head:     Comments: No sinus tenderness    Right Ear: Tympanic membrane and ear canal normal.     Left Ear: Tympanic membrane and ear canal normal.     Mouth/Throat:     Comments: Slight pharyngeal injection Pulmonary:     Effort: Pulmonary effort is normal.     Breath sounds: Normal breath sounds. No wheezing or rales.  Musculoskeletal:     Cervical back: Neck supple.  Lymphadenopathy:     Cervical: No cervical adenopathy.  Neurological:     Mental Status: She is alert.            Assessment & Plan:

## 2023-03-28 NOTE — Assessment & Plan Note (Signed)
Mild symptoms and too late to consider paxlovid Discussed tylenol and OTC cough med prn Out with mask next week if symptoms improved COVID vaccine ~3 months---unless it flares again before then

## 2023-04-01 ENCOUNTER — Other Ambulatory Visit (INDEPENDENT_AMBULATORY_CARE_PROVIDER_SITE_OTHER): Payer: PPO

## 2023-04-01 DIAGNOSIS — E038 Other specified hypothyroidism: Secondary | ICD-10-CM

## 2023-04-01 LAB — TSH: TSH: 27.01 u[IU]/mL — ABNORMAL HIGH (ref 0.35–5.50)

## 2023-04-04 ENCOUNTER — Ambulatory Visit: Payer: PPO | Attending: Obstetrics and Gynecology | Admitting: Physical Therapy

## 2023-04-04 DIAGNOSIS — R2689 Other abnormalities of gait and mobility: Secondary | ICD-10-CM | POA: Insufficient documentation

## 2023-04-04 DIAGNOSIS — R278 Other lack of coordination: Secondary | ICD-10-CM | POA: Diagnosis not present

## 2023-04-04 DIAGNOSIS — M25511 Pain in right shoulder: Secondary | ICD-10-CM | POA: Insufficient documentation

## 2023-04-04 DIAGNOSIS — G8929 Other chronic pain: Secondary | ICD-10-CM | POA: Insufficient documentation

## 2023-04-04 DIAGNOSIS — M5459 Other low back pain: Secondary | ICD-10-CM | POA: Insufficient documentation

## 2023-04-04 DIAGNOSIS — M533 Sacrococcygeal disorders, not elsewhere classified: Secondary | ICD-10-CM | POA: Insufficient documentation

## 2023-04-04 NOTE — Therapy (Signed)
OUTPATIENT PHYSICAL THERAPY TREATMENT    Patient Name: Mercedes [name] MRN: [id number] DOB:06-14-1946, 77 y.o., female Today's Date: 04/04/2023   PT End of Session - 04/04/23 1422     Visit Number 5    Number of Visits 10    Date for PT Re-Evaluation 04/30/23    PT Start Time 1423    PT Stop Time 1505    PT Time Calculation (min) 42 min    Activity Tolerance Patient tolerated treatment well;No increased pain    Behavior During Therapy WFL for tasks assessed/performed             Past Medical History:  Diagnosis Date   Allergy    Cataract    bilateral-not an issue right now   Diverticulosis of colon    Fatty liver    Gout    HLD (hyperlipidemia)    under control   HTN (hypertension)    Hypothyroidism    Macular degeneration    Osteoarthritis    Rapid heartbeat    occasional, does not last long   Tobacco abuse    Past Surgical History:  Procedure Laterality Date   CATARACT EXTRACTION     CHOLECYSTECTOMY N/A 03/17/2014   Procedure: LAPAROSCOPIC CHOLECYSTECTOMY WITH INTRAOPERATIVE CHOLANGIOGRAM ;  Surgeon: Claud Kelp, MD;  Location: [facility];  Service: General;  Laterality: N/A;   COLONOSCOPY     removed polyps   lower back surgery      PARATHYROIDECTOMY Left 08/20/2022   Procedure: LEFT PARATHYROIDECTOMY;  Surgeon: Darnell Level, MD;  Location: [facility];  Service: General;  Laterality: Left;   SIGMOIDOSCOPY     Patient Active Problem List   Diagnosis Date Noted   COVID-19 virus infection 03/28/2023   Paresthesia 12/16/2022   Right shoulder pain 12/16/2022   Acute recurrent cystitis 07/04/2022   Spondylolisthesis of lumbar region 05/21/2022   Elevated hemoglobin (HCC) 11/07/2021   Swelling of right index finger 11/06/2021   Vaginal discharge 01/06/2021   Urinary frequency 01/06/2021   Hyperparathyroidism, primary (HCC) 12/11/2020   Health care maintenance 01/23/2018   Macular degeneration 01/23/2018   Low back pain 12/29/2015   Advance care planning  12/14/2014   Vitamin D deficiency 12/14/2014   Impacted cerumen of left ear 01/29/2014   Hypercalcemia 12/01/2013   Hyperglycemia 10/02/2012   Medicare annual wellness visit, subsequent 10/02/2012   Leg pain 07/09/2012   Upper respiratory tract infection 11/03/2010   Gout    HLD (hyperlipidemia)    Hypothyroidism 01/10/2007   HYPERTENSION, BENIGN ESSENTIAL 01/10/2007   MENOPAUSAL SYNDROME 01/10/2007   Osteoarthritis 01/10/2007    PCP: Para March   REFERRING PROVIDER: Cordelia Pen   REFERRING DIAG: urinary frequency and urgency   Rationale for Evaluation and Treatment Rehabilitation  THERAPY DIAG:  Other abnormalities of gait and mobility  Sacrococcygeal disorders, not elsewhere classified  Other low back pain  Other lack of coordination  Chronic right shoulder pain  ONSET DATE:   SUBJECTIVE:      SUBJECTIVE STATEMENT TODAY:    Pt reports she is now able to shampoo but not able to reach behind her head  Pt got COVID and has recovered but she was not able to do her HEP.   SUBJECTIVE STATEMENT ON EVAL 02/19/23: 1) urinary frequency and urgency:   pt goes to the toilet once every 2 hours during day, every hour at night ( bedtime 11pm, wakes up 7am, drink wine 7pm, pt does not take any direutics) . Pt has to urinate again after  leaving house 20 -30 min. Daily fluid intake:  16 fl oz water, 32 floz unsweeten tea, no soda, no coffee, 10 oz of wine per night.  SUI with sneezing   2) CLBP :  Hx of spinal fusion 2023. 6/10 pain after standing to cook for > 10 min.  Denied radiating pain   3) R shoulder pain:  since a fall in Jan 2024:  not able to fold laundry with lateral movement to the R, reaching up to put away dishes, to do hair, turning steering wheel in car  occurs with 4/10 . Denied radiating . Sleeps on both sides but no pillow between knees   PERTINENT HISTORY:    PAIN:  Are you having pain? Yes: see above   PRECAUTIONS: None  WEIGHT BEARING RESTRICTIONS: No  FALLS:   Has patient fallen in last 6 months? No  LIVING ENVIRONMENT: Lives with: lives with their spouse Lives in: house  Stairs: 3 STE with rail  Has following equipment at home: No  OCCUPATION: Programmer, systems , hobbies: reading, cooking, going out with friends, card games, sewing   PLOF: Independent  PATIENT GOALS:  Pain free in shoulder, resume lifestyles includes walking , going to gym, participating in social function    OBJECTIVE:    [facility] PT Assessment - 04/04/23 1435       AROM   Overall AROM Comments shoulder flexion R 100 deg      Palpation   SI assessment  Levelled pelvic girdle,  shoulders    Palpation comment significant tightness along medial/ lower scapula, intercostals, paraspinals R , limited scapular movement,, occiput/ C/T junction hypomobile      Ambulation/Gait   Gait Comments 1.39 m/s , reciprocal gait             [facility] Adult PT Treatment/Exercise - 04/04/23 1435       Neuro Re-ed    Neuro Re-ed Details  cued for cervicall/ scapular mobility      Modalities   Modalities Moist Heat      Moist Heat Therapy   Number Minutes Moist Heat 4 Minutes    Moist Heat Location Cervical   unbilled,     Manual Therapy   Manual therapy comments STM/MWM at areas noted in assessment, occiputal distraction , depression of clavical R               HOME EXERCISE PROGRAM: See pt instruction section    ASSESSMENT:  CLINICAL IMPRESSION:  Improvements:  Pt nocturia decreased from every hour to 2-3 x / night.  Pt reports she is now able to shampoo but not able to reach behind her head  Continued to apply manual Tx to minimize forward head posture, optimize cervical/ scapular mobility. Pt demo'd improved ability to reach hand behind head which will gradually help her achieve with ADLs. PLan to add cervical scapular strengthening at next session  Posture improvements will help pt be prepared to learn deep core at next session to decrease LBP and improve pelvic  issues.    Plan to progress to deep core training to help with CLBP, pelvic issues.     Plan to address pelvic floor issues once pelvis and spine are realigned to yield better outcomes.     Pt benefits from skilled PT.    OBJECTIVE IMPAIRMENTS decreased activity tolerance, decreased coordination, decreased endurance, decreased mobility, difficulty walking, decreased ROM, decreased strength, decreased safety awareness, hypomobility, increased muscle spasms, impaired flexibility, improper body mechanics, postural dysfunction, and pain. scar  restrictions   ACTIVITY LIMITATIONS  self-care,  sleep, home chores, work tasks    PARTICIPATION LIMITATIONS:  community, ADLs, activities    PERSONAL FACTORS       are also affecting patient's functional outcome.    REHAB POTENTIAL: Good   CLINICAL DECISION MAKING: Evolving/moderate complexity   EVALUATION COMPLEXITY: Moderate    PATIENT EDUCATION:    Education details: Showed pt anatomy images. Explained muscles attachments/ connection, physiology of deep core system/ spinal- thoracic-pelvis-lower kinetic chain as they relate to pt's presentation, Sx, and past Hx. Explained what and how these areas of deficits need to be restored to balance and function    See Therapeutic activity / neuromuscular re-education section  Answered pt's questions.   Person educated: Patient Education method: Explanation, Demonstration, Tactile cues, Verbal cues, and Handouts Education comprehension: verbalized understanding, returned demonstration, verbal cues required, tactile cues required, and needs further education     PLAN: PT FREQUENCY: 1x/week   PT DURATION: 10 weeks   PLANNED INTERVENTIONS: Therapeutic exercises, Therapeutic activity, Neuromuscular re-education, Balance training, Gait training, Patient/Family education, Self Care, Joint mobilization, Spinal mobilization, Moist heat, Taping, and Manual therapy, dry needling.   PLAN FOR NEXT SESSION:  See clinical impression for plan     GOALS: Goals reviewed with patient? Yes  SHORT TERM GOALS: Target date: 03/19/2023    Pt will demo IND with HEP                    Baseline: Not IND            Goal status: INITIAL   LONG TERM GOALS: Target date: 04/30/2023    1.Pt will demo proper deep core coordination without chest breathing and optimal excursion of diaphragm/pelvic floor in order to promote spinal stability and pelvic floor function  Baseline: dyscoordination Goal status: INITIAL  2.  Pt will demo > 5 pt change on FOTO  to improve QOL and function  PFDI Urinary baseline - 29 Lower score = better function    PFDI Prolapse  29 Lower score = better function   Pelvic Pain baseline - 8 Lower score = better function    PFDI Bowel -33 Higher score = better function  Lumber baseline  - 50 Higher score = better function   Goal status: INITIAL  3.  Pt will demo proper body mechanics in against gravity tasks and ADLs  work tasks, fitness  to minimize straining pelvic floor / back    Baseline: not IND, improper form that places strain on pelvic floor  Goal status: Ongoing     4. Pt will demo increased gait speed > 1.4 m/s with reciprocal gait pattern, longer stride length  in order to ambulate safely in community and return to fitness routine  Baseline: R shoulder. iliac crest lowered , 1.29 m/s, limited posterior rotation of R shoulder and pelvis , excessive sway to L  Goal status:MET ( 1.39 m/s)     5. Pt reports < 3/10 pain after standing to cook for > 10 min.   Baseline:  6/10 pain after standing to cook for > 10 min.   Goal status: On going     6. Pt will regain shoulder flexion to > 135 deg and reach behind head is with digit III at T1 in order to fix her hair and to able to fold laundry with lateral movement to the R, reaching up to put away dishes, to do hair, turning steering wheel in car  Baseline:  Shoulder 90 deg flexion R, L 135 deg  , hands  behind head digit III at top of ear , pain with able to fold laundry with lateral movement to the R, reaching up to put away dishes, to do hair, turning steering wheel in car  Goal status: Ongoing    7. Pt will report increase water intake from 16 fl oz to > 48 fl oz  and decrease bladder irritants ( wine from 10 oz per night to < 5 oz night) , tea 32 fl oz to < 16 fl oz  Baseline:  Daily fluid intake:  16 fl oz water, 32 floz unsweeten tea, no soda, no coffee, 10 oz of wine per night   Goal status: MET ( 48 fl oz water, 16 fl oz tea)    8. Pt will follow with PCP for sleep study to rule in/ out OSA  Baseline: nocturia:  every hours between 11pm to 7am, husband tells her she snores, pt with Hx high BP, cholesterol   Goal status: Deferred  Pt received information about link between nocturia and OSA Pt nocturia decreased from every hour to 2-3 x / night.    Mariane Masters, PT, DPT, E-RYT Pelvic Health Physical Therapist [email or web address] [phone]

## 2023-04-04 NOTE — Patient Instructions (Signed)
Add chin tilt back, stick tongue out  lower shoulders with 6 directions of neck

## 2023-04-05 DIAGNOSIS — M48062 Spinal stenosis, lumbar region with neurogenic claudication: Secondary | ICD-10-CM | POA: Diagnosis not present

## 2023-04-05 DIAGNOSIS — M4316 Spondylolisthesis, lumbar region: Secondary | ICD-10-CM | POA: Diagnosis not present

## 2023-04-07 ENCOUNTER — Other Ambulatory Visit: Payer: Self-pay | Admitting: Family Medicine

## 2023-04-07 DIAGNOSIS — E038 Other specified hypothyroidism: Secondary | ICD-10-CM

## 2023-04-07 MED ORDER — LEVOTHYROXINE SODIUM 25 MCG PO TABS: ORAL_TABLET | ORAL | Status: DC

## 2023-04-09 DIAGNOSIS — H353221 Exudative age-related macular degeneration, left eye, with active choroidal neovascularization: Secondary | ICD-10-CM | POA: Diagnosis not present

## 2023-04-10 ENCOUNTER — Other Ambulatory Visit: Payer: Self-pay | Admitting: Family Medicine

## 2023-04-10 MED ORDER — LEVOTHYROXINE SODIUM 50 MCG PO TABS
50.0000 ug | ORAL_TABLET | Freq: Every day | ORAL | 3 refills | Status: DC
  Filled 2024-01-07: qty 90, 90d supply, fill #0

## 2023-04-11 ENCOUNTER — Ambulatory Visit: Payer: PPO | Admitting: Physical Therapy

## 2023-04-11 DIAGNOSIS — R278 Other lack of coordination: Secondary | ICD-10-CM

## 2023-04-11 DIAGNOSIS — R2689 Other abnormalities of gait and mobility: Secondary | ICD-10-CM | POA: Diagnosis not present

## 2023-04-11 DIAGNOSIS — M533 Sacrococcygeal disorders, not elsewhere classified: Secondary | ICD-10-CM

## 2023-04-11 DIAGNOSIS — M5459 Other low back pain: Secondary | ICD-10-CM

## 2023-04-11 DIAGNOSIS — G8929 Other chronic pain: Secondary | ICD-10-CM

## 2023-04-11 NOTE — Therapy (Signed)
OUTPATIENT PHYSICAL THERAPY TREATMENT    Patient Name: Mercedes [name] MRN: [id number] DOB:Jun 04, 1946, 77 y.o., female Today's Date: 04/11/2023   PT End of Session - 04/11/23 1426     Visit Number 6    Number of Visits 10    Date for PT Re-Evaluation 04/30/23    PT Start Time 1410    PT Stop Time 1450    PT Time Calculation (min) 40 min    Activity Tolerance Patient tolerated treatment well;No increased pain    Behavior During Therapy WFL for tasks assessed/performed             Past Medical History:  Diagnosis Date   Allergy    Cataract    bilateral-not an issue right now   Diverticulosis of colon    Fatty liver    Gout    HLD (hyperlipidemia)    under control   HTN (hypertension)    Hypothyroidism    Macular degeneration    Osteoarthritis    Rapid heartbeat    occasional, does not last long   Tobacco abuse    Past Surgical History:  Procedure Laterality Date   CATARACT EXTRACTION     CHOLECYSTECTOMY N/A 03/17/2014   Procedure: LAPAROSCOPIC CHOLECYSTECTOMY WITH INTRAOPERATIVE CHOLANGIOGRAM ;  Surgeon: Claud Kelp, MD;  Location: [facility];  Service: General;  Laterality: N/A;   COLONOSCOPY     removed polyps   lower back surgery      PARATHYROIDECTOMY Left 08/20/2022   Procedure: LEFT PARATHYROIDECTOMY;  Surgeon: Darnell Level, MD;  Location: [facility];  Service: General;  Laterality: Left;   SIGMOIDOSCOPY     Patient Active Problem List   Diagnosis Date Noted   COVID-19 virus infection 03/28/2023   Paresthesia 12/16/2022   Right shoulder pain 12/16/2022   Acute recurrent cystitis 07/04/2022   Spondylolisthesis of lumbar region 05/21/2022   Elevated hemoglobin (HCC) 11/07/2021   Swelling of right index finger 11/06/2021   Vaginal discharge 01/06/2021   Urinary frequency 01/06/2021   Hyperparathyroidism, primary (HCC) 12/11/2020   Health care maintenance 01/23/2018   Macular degeneration 01/23/2018   Low back pain 12/29/2015   Advance care planning  12/14/2014   Vitamin D deficiency 12/14/2014   Impacted cerumen of left ear 01/29/2014   Hypercalcemia 12/01/2013   Hyperglycemia 10/02/2012   Medicare annual wellness visit, subsequent 10/02/2012   Leg pain 07/09/2012   Upper respiratory tract infection 11/03/2010   Gout    HLD (hyperlipidemia)    Hypothyroidism 01/10/2007   HYPERTENSION, BENIGN ESSENTIAL 01/10/2007   MENOPAUSAL SYNDROME 01/10/2007   Osteoarthritis 01/10/2007    PCP: Para March   REFERRING PROVIDER: Cordelia Pen   REFERRING DIAG: urinary frequency and urgency   Rationale for Evaluation and Treatment Rehabilitation  THERAPY DIAG:  Other abnormalities of gait and mobility  Other low back pain  Sacrococcygeal disorders, not elsewhere classified  Other lack of coordination  Chronic right shoulder pain  ONSET DATE:   SUBJECTIVE:      SUBJECTIVE STATEMENT TODAY:    Pt reports her R shoulder is 30% better,  Pt saw her surgeon  and was told she has lots of arthritis.   SUBJECTIVE STATEMENT ON EVAL 02/19/23: 1) urinary frequency and urgency:   pt goes to the toilet once every 2 hours during day, every hour at night ( bedtime 11pm, wakes up 7am, drink wine 7pm, pt does not take any direutics) . Pt has to urinate again after leaving house 20 -30 min. Daily fluid intake:  16  fl oz water, 32 floz unsweeten tea, no soda, no coffee, 10 oz of wine per night.  SUI with sneezing   2) CLBP :  Hx of spinal fusion 2023. 6/10 pain after standing to cook for > 10 min.  Denied radiating pain   3) R shoulder pain:  since a fall in Jan 2024:  not able to fold laundry with lateral movement to the R, reaching up to put away dishes, to do hair, turning steering wheel in car  occurs with 4/10 . Denied radiating . Sleeps on both sides but no pillow between knees   PERTINENT HISTORY:    PAIN:  Are you having pain? Yes: see above   PRECAUTIONS: None  WEIGHT BEARING RESTRICTIONS: No  FALLS:  Has patient fallen in last 6 months?  No  LIVING ENVIRONMENT: Lives with: lives with their spouse Lives in: house  Stairs: 3 STE with rail  Has following equipment at home: No  OCCUPATION: Programmer, systems , hobbies: reading, cooking, going out with friends, card games, sewing   PLOF: Independent  PATIENT GOALS:  Pain free in shoulder, resume lifestyles includes walking , going to gym, participating in social function    OBJECTIVE:     [facility] PT Assessment - 04/11/23 1443       Coordination   Coordination and Movement Description no chest breathing, for deep core level 1-2      Palpation   Palpation comment significant tightness along medial/ lower scapula, intercostals, paraspinals R , limited scapular movement,, occiput/ C/T junction hypomobile             [facility] Adult PT Treatment/Exercise - 04/11/23 1443       Neuro Re-ed    Neuro Re-ed Details  excessive cues for deep core level stability of feet . pelvis      Modalities   Modalities Moist Heat      Moist Heat Therapy   Number Minutes Moist Heat 4 Minutes    Moist Heat Location Cervical   unbilled     Manual Therapy   Manual therapy comments STM/MWM at medial scapula, depression clavicle with cervical mm attachments               HOME EXERCISE PROGRAM: See pt instruction section    ASSESSMENT:  CLINICAL IMPRESSION:  Reviewed deep core exercises and provided cues for proper technique.   Continued to apply manual Tx to minimize forward head posture, optimize cervical/ scapular mobility. Pt demo'd improved scapular mobility on R which will continue to help with her shoulder limitations.   Plan to add cervical scapular strengthening at next session  Posture improvements will help pt be prepared to learn deep core at next session to decrease LBP and improve pelvic issues.    Plan to progress to deep core training to help with CLBP, pelvic issues.     Plan to address pelvic floor issues once pelvis and spine are realigned to yield better  outcomes.     Pt benefits from skilled PT.    OBJECTIVE IMPAIRMENTS decreased activity tolerance, decreased coordination, decreased endurance, decreased mobility, difficulty walking, decreased ROM, decreased strength, decreased safety awareness, hypomobility, increased muscle spasms, impaired flexibility, improper body mechanics, postural dysfunction, and pain. scar restrictions   ACTIVITY LIMITATIONS  self-care,  sleep, home chores, work tasks    PARTICIPATION LIMITATIONS:  community, ADLs, activities    PERSONAL FACTORS       are also affecting patient's functional outcome.    REHAB POTENTIAL: Good  CLINICAL DECISION MAKING: Evolving/moderate complexity   EVALUATION COMPLEXITY: Moderate    PATIENT EDUCATION:    Education details: Showed pt anatomy images. Explained muscles attachments/ connection, physiology of deep core system/ spinal- thoracic-pelvis-lower kinetic chain as they relate to pt's presentation, Sx, and past Hx. Explained what and how these areas of deficits need to be restored to balance and function    See Therapeutic activity / neuromuscular re-education section  Answered pt's questions.   Person educated: Patient Education method: Explanation, Demonstration, Tactile cues, Verbal cues, and Handouts Education comprehension: verbalized understanding, returned demonstration, verbal cues required, tactile cues required, and needs further education     PLAN: PT FREQUENCY: 1x/week   PT DURATION: 10 weeks   PLANNED INTERVENTIONS: Therapeutic exercises, Therapeutic activity, Neuromuscular re-education, Balance training, Gait training, Patient/Family education, Self Care, Joint mobilization, Spinal mobilization, Moist heat, Taping, and Manual therapy, dry needling.   PLAN FOR NEXT SESSION: See clinical impression for plan     GOALS: Goals reviewed with patient? Yes  SHORT TERM GOALS: Target date: 03/19/2023    Pt will demo IND with HEP                     Baseline: Not IND            Goal status: INITIAL   LONG TERM GOALS: Target date: 04/30/2023    1.Pt will demo proper deep core coordination without chest breathing and optimal excursion of diaphragm/pelvic floor in order to promote spinal stability and pelvic floor function  Baseline: dyscoordination Goal status: INITIAL  2.  Pt will demo > 5 pt change on FOTO  to improve QOL and function  PFDI Urinary baseline - 29 Lower score = better function    PFDI Prolapse  29    Lower score = better function   Pelvic Pain baseline - 8 Lower score = better function    PFDI Bowel -33 Higher score = better function  Lumber baseline  - 50 Higher score = better function   Goal status: INITIAL  3.  Pt will demo proper body mechanics in against gravity tasks and ADLs  work tasks, fitness  to minimize straining pelvic floor / back    Baseline: not IND, improper form that places strain on pelvic floor  Goal status: Ongoing     4. Pt will demo increased gait speed > 1.4 m/s with reciprocal gait pattern, longer stride length  in order to ambulate safely in community and return to fitness routine  Baseline: R shoulder. iliac crest lowered , 1.29 m/s, limited posterior rotation of R shoulder and pelvis , excessive sway to L  Goal status:MET ( 1.39 m/s)     5. Pt reports < 3/10 pain after standing to cook for > 10 min.   Baseline:  6/10 pain after standing to cook for > 10 min.   Goal status: On going     6. Pt will regain shoulder flexion to > 135 deg and reach behind head is with digit III at T1 in order to fix her hair and to able to fold laundry with lateral movement to the R, reaching up to put away dishes, to do hair, turning steering wheel in car  Baseline: Shoulder 90 deg flexion R, L 135 deg  , hands behind head digit III at top of ear , pain with able to fold laundry with lateral movement to the R, reaching up to put away dishes, to do hair, turning steering wheel  in car   Goal status: Ongoing    7. Pt will report increase water intake from 16 fl oz to > 48 fl oz  and decrease bladder irritants ( wine from 10 oz per night to < 5 oz night) , tea 32 fl oz to < 16 fl oz  Baseline:  Daily fluid intake:  16 fl oz water, 32 floz unsweeten tea, no soda, no coffee, 10 oz of wine per night   Goal status: MET ( 48 fl oz water, 16 fl oz tea)    8. Pt will follow with PCP for sleep study to rule in/ out OSA  Baseline: nocturia:  every hours between 11pm to 7am, husband tells her she snores, pt with Hx high BP, cholesterol   Goal status: Deferred  Pt received information about link between nocturia and OSA Pt nocturia decreased from every hour to 2-3 x / night.    Mariane Masters, PT, DPT, E-RYT Pelvic Health Physical Therapist [email or web address] [phone]

## 2023-04-11 NOTE — Patient Instructions (Signed)
Deep core level 1- 2 (handout) twice a day

## 2023-04-18 ENCOUNTER — Encounter: Payer: PPO | Admitting: Physical Therapy

## 2023-04-23 ENCOUNTER — Ambulatory Visit: Payer: PPO | Admitting: Physical Therapy

## 2023-04-23 DIAGNOSIS — R278 Other lack of coordination: Secondary | ICD-10-CM

## 2023-04-23 DIAGNOSIS — R2689 Other abnormalities of gait and mobility: Secondary | ICD-10-CM | POA: Diagnosis not present

## 2023-04-23 DIAGNOSIS — M533 Sacrococcygeal disorders, not elsewhere classified: Secondary | ICD-10-CM

## 2023-04-23 DIAGNOSIS — G8929 Other chronic pain: Secondary | ICD-10-CM

## 2023-04-23 DIAGNOSIS — M5459 Other low back pain: Secondary | ICD-10-CM

## 2023-04-23 NOTE — Therapy (Signed)
OUTPATIENT PHYSICAL THERAPY TREATMENT  / Recert    Patient Name: Mercedes [name] MRN: [id number] DOB:1945/10/20, 77 y.o., female Today's Date: 04/23/2023   PT End of Session - 04/23/23 1426     Visit Number 7    Number of Visits 17    Date for PT Re-Evaluation 07/02/23    PT Start Time 1421    PT Stop Time 1500    PT Time Calculation (min) 39 min    Activity Tolerance Patient tolerated treatment well;No increased pain    Behavior During Therapy WFL for tasks assessed/performed             Past Medical History:  Diagnosis Date   Allergy    Cataract    bilateral-not an issue right now   Diverticulosis of colon    Fatty liver    Gout    HLD (hyperlipidemia)    under control   HTN (hypertension)    Hypothyroidism    Macular degeneration    Osteoarthritis    Rapid heartbeat    occasional, does not last long   Tobacco abuse    Past Surgical History:  Procedure Laterality Date   CATARACT EXTRACTION     CHOLECYSTECTOMY N/A 03/17/2014   Procedure: LAPAROSCOPIC CHOLECYSTECTOMY WITH INTRAOPERATIVE CHOLANGIOGRAM ;  Surgeon: Claud Kelp, MD;  Location: [facility];  Service: General;  Laterality: N/A;   COLONOSCOPY     removed polyps   lower back surgery      PARATHYROIDECTOMY Left 08/20/2022   Procedure: LEFT PARATHYROIDECTOMY;  Surgeon: Darnell Level, MD;  Location: [facility];  Service: General;  Laterality: Left;   SIGMOIDOSCOPY     Patient Active Problem List   Diagnosis Date Noted   COVID-19 virus infection 03/28/2023   Paresthesia 12/16/2022   Right shoulder pain 12/16/2022   Acute recurrent cystitis 07/04/2022   Spondylolisthesis of lumbar region 05/21/2022   Elevated hemoglobin (HCC) 11/07/2021   Swelling of right index finger 11/06/2021   Vaginal discharge 01/06/2021   Urinary frequency 01/06/2021   Hyperparathyroidism, primary (HCC) 12/11/2020   Health care maintenance 01/23/2018   Macular degeneration 01/23/2018   Low back pain 12/29/2015   Advance care  planning 12/14/2014   Vitamin D deficiency 12/14/2014   Impacted cerumen of left ear 01/29/2014   Hypercalcemia 12/01/2013   Hyperglycemia 10/02/2012   Medicare annual wellness visit, subsequent 10/02/2012   Leg pain 07/09/2012   Upper respiratory tract infection 11/03/2010   Gout    HLD (hyperlipidemia)    Hypothyroidism 01/10/2007   HYPERTENSION, BENIGN ESSENTIAL 01/10/2007   MENOPAUSAL SYNDROME 01/10/2007   Osteoarthritis 01/10/2007    PCP: Para March   REFERRING PROVIDER: Cordelia Pen   REFERRING DIAG: urinary frequency and urgency   Rationale for Evaluation and Treatment Rehabilitation  THERAPY DIAG:  No diagnosis found.  ONSET DATE:   SUBJECTIVE:      SUBJECTIVE STATEMENT TODAY:    Pt reports her R shoulder is 30% better and LBP is 30% improved. Pt is still urinating once every 1 to 1.5 hours.  Pt drinks 16 fl oz - 32 fl oz water per day.  SUBJECTIVE STATEMENT ON EVAL 02/19/23: 1) urinary frequency and urgency:   pt goes to the toilet once every 2 hours during day, every hour at night ( bedtime 11pm, wakes up 7am, drink wine 7pm, pt does not take any direutics) . Pt has to urinate again after leaving house 20 -30 min. Daily fluid intake:  16 fl oz water, 32 floz unsweeten tea, no  soda, no coffee, 10 oz of wine per night.  SUI with sneezing   2) CLBP :  Hx of spinal fusion 2023. 6/10 pain after standing to cook for > 10 min.  Denied radiating pain   3) R shoulder pain:  since a fall in Jan 2024:  not able to fold laundry with lateral movement to the R, reaching up to put away dishes, to do hair, turning steering wheel in car  occurs with 4/10 . Denied radiating . Sleeps on both sides but no pillow between knees   PERTINENT HISTORY:    PAIN:  Are you having pain? Yes: see above   PRECAUTIONS: None  WEIGHT BEARING RESTRICTIONS: No  FALLS:  Has patient fallen in last 6 months? No  LIVING ENVIRONMENT: Lives with: lives with their spouse Lives in: house  Stairs: 3 STE  with rail  Has following equipment at home: No  OCCUPATION: Programmer, systems , hobbies: reading, cooking, going out with friends, card games, sewing   PLOF: Independent  PATIENT GOALS:  Pain free in shoulder, resume lifestyles includes walking , going to gym, participating in social function    OBJECTIVE:         HOME EXERCISE PROGRAM: See pt instruction section    ASSESSMENT:  CLINICAL IMPRESSION: Pt achieved 3/7 goals and progressing towards remaining goals.   Improvements: Pt reports her R shoulder is 30% better and LBP is 30% improved. Pt demo'd improved scapular mobility on R and shoulder AROM. Marland Kitchen   Currently working to improve posture, less forward head / thoracic kyphosis which will help with pelvic / lumbar function.   Today advised to maintain a consistent increased water intake from 16-32 fl oz to 48 fl oz of water to improve ph of bladder, minimize UTIs, and frequency    Plan to add cervical scapular strengthening at next session.  .   Plan to assess pelvic floor to address pelvic issues. Advised pt to increase water intake to help minimize frequency and risk for UTIs.   Pt benefits from skilled PT.    OBJECTIVE IMPAIRMENTS decreased activity tolerance, decreased coordination, decreased endurance, decreased mobility, difficulty walking, decreased ROM, decreased strength, decreased safety awareness, hypomobility, increased muscle spasms, impaired flexibility, improper body mechanics, postural dysfunction, and pain. scar restrictions   ACTIVITY LIMITATIONS  self-care,  sleep, home chores, work tasks    PARTICIPATION LIMITATIONS:  community, ADLs, activities    PERSONAL FACTORS       are also affecting patient's functional outcome.    REHAB POTENTIAL: Good   CLINICAL DECISION MAKING: Evolving/moderate complexity   EVALUATION COMPLEXITY: Moderate    PATIENT EDUCATION:    Education details: Showed pt anatomy images. Explained muscles attachments/ connection,  physiology of deep core system/ spinal- thoracic-pelvis-lower kinetic chain as they relate to pt's presentation, Sx, and past Hx. Explained what and how these areas of deficits need to be restored to balance and function    See Therapeutic activity / neuromuscular re-education section  Answered pt's questions.   Person educated: Patient Education method: Explanation, Demonstration, Tactile cues, Verbal cues, and Handouts Education comprehension: verbalized understanding, returned demonstration, verbal cues required, tactile cues required, and needs further education     PLAN: PT FREQUENCY: 1x/week   PT DURATION: 10 weeks   PLANNED INTERVENTIONS: Therapeutic exercises, Therapeutic activity, Neuromuscular re-education, Balance training, Gait training, Patient/Family education, Self Care, Joint mobilization, Spinal mobilization, Moist heat, Taping, and Manual therapy, dry needling.   PLAN FOR NEXT SESSION: See clinical impression for  plan     GOALS: Goals reviewed with patient? Yes  SHORT TERM GOALS: Target date: 03/19/2023    Pt will demo IND with HEP                    Baseline: Not IND            Goal status: MET    LONG TERM GOALS: Target date: 07/02/2023    1.Pt will demo proper deep core coordination without chest breathing and optimal excursion of diaphragm/pelvic floor in order to promote spinal stability and pelvic floor function  Baseline: dyscoordination Goal status: INITIAL  2.  Pt will demo > 5 pt change on FOTO  to improve QOL and function  PFDI Urinary baseline - 29 Lower score = better function    PFDI Prolapse  29    Lower score = better function   Pelvic Pain baseline - 8 Lower score = better function    PFDI Bowel -33 Higher score = better function  Lumber baseline  - 50 Higher score = better function   Goal status: INITIAL  3.  Pt will demo proper body mechanics in against gravity tasks and ADLs  work tasks, fitness  to minimize  straining pelvic floor / back    Baseline: not IND, improper form that places strain on pelvic floor  Goal status: Ongoing     4. Pt will demo increased gait speed > 1.4 m/s with reciprocal gait pattern, longer stride length  in order to ambulate safely in community and return to fitness routine  Baseline: R shoulder. iliac crest lowered , 1.29 m/s, limited posterior rotation of R shoulder and pelvis , excessive sway to L  Goal status:MET ( 1.39 m/s)     5. Pt reports < 3/10 pain after standing to cook for > 10 min.   Baseline:  6/10 pain after standing to cook for > 10 min.   Goal status: MET ( 10/22: able to cook for > 15 min before needing to sit down)    6. Pt will regain shoulder flexion to > 135 deg and reach behind head is with digit III at T1 in order to fix her hair and to able to fold laundry with lateral movement to the R, reaching up to put away dishes, to do hair, turning steering wheel in car  Baseline: Shoulder 90 deg flexion R, L 135 deg  , hands behind head digit III at top of ear , pain with able to fold laundry with lateral movement to the R, reaching up to put away dishes, to do hair, turning steering wheel in car  Goal status: Ongoing    7. Pt will report increase water intake from 16 fl oz to > 48 fl oz  and decrease bladder irritants ( wine from 10 oz per night to < 5 oz night) , tea 32 fl oz to < 16 fl oz  Baseline:  Daily fluid intake:  16 fl oz water, 32 floz unsweeten tea, no soda, no coffee, 10 oz of wine per night   Goal status: MET ( 48 fl oz water, 16 fl oz tea)    8. Pt will follow with PCP for sleep study to rule in/ out OSA  Baseline: nocturia:  every hours between 11pm to 7am, husband tells her she snores, pt with Hx high BP, cholesterol   Goal status: Deferred  Pt received information about link between nocturia and OSA Pt nocturia decreased from every  hour to 2-3 x / night.    Mariane Masters, PT, DPT, E-RYT Pelvic Health Physical  Therapist [email or web address] [phone]

## 2023-04-23 NOTE — Patient Instructions (Addendum)
advised to maintain a consistent increased water intake from 16-32 fl oz to 48 fl oz of water to improve ph of bladder, minimize UTIs, and frequency   Chin tucks when lying on back with pillow  3 sec, 10 reps

## 2023-04-24 ENCOUNTER — Ambulatory Visit (INDEPENDENT_AMBULATORY_CARE_PROVIDER_SITE_OTHER): Payer: PPO

## 2023-04-24 DIAGNOSIS — Z23 Encounter for immunization: Secondary | ICD-10-CM | POA: Diagnosis not present

## 2023-04-30 ENCOUNTER — Ambulatory Visit: Payer: PPO | Admitting: Physical Therapy

## 2023-04-30 DIAGNOSIS — G8929 Other chronic pain: Secondary | ICD-10-CM

## 2023-04-30 DIAGNOSIS — R2689 Other abnormalities of gait and mobility: Secondary | ICD-10-CM | POA: Diagnosis not present

## 2023-04-30 DIAGNOSIS — M533 Sacrococcygeal disorders, not elsewhere classified: Secondary | ICD-10-CM

## 2023-04-30 DIAGNOSIS — M5459 Other low back pain: Secondary | ICD-10-CM

## 2023-04-30 DIAGNOSIS — R278 Other lack of coordination: Secondary | ICD-10-CM

## 2023-04-30 NOTE — Therapy (Signed)
OUTPATIENT PHYSICAL THERAPY TREATMENT   Patient Name: Mercedes [name] MRN: [id number] DOB:January 12, 1946, 77 y.o., female Today's Date: 04/30/2023   PT End of Session - 04/30/23 1511     Visit Number 8    Number of Visits 17    Date for PT Re-Evaluation 07/02/23    PT Start Time 1510    PT Stop Time 1550    PT Time Calculation (min) 40 min    Activity Tolerance Patient tolerated treatment well;No increased pain    Behavior During Therapy WFL for tasks assessed/performed             Past Medical History:  Diagnosis Date   Allergy    Cataract    bilateral-not an issue right now   Diverticulosis of colon    Fatty liver    Gout    HLD (hyperlipidemia)    under control   HTN (hypertension)    Hypothyroidism    Macular degeneration    Osteoarthritis    Rapid heartbeat    occasional, does not last long   Tobacco abuse    Past Surgical History:  Procedure Laterality Date   CATARACT EXTRACTION     CHOLECYSTECTOMY N/A 03/17/2014   Procedure: LAPAROSCOPIC CHOLECYSTECTOMY WITH INTRAOPERATIVE CHOLANGIOGRAM ;  Surgeon: Claud Kelp, MD;  Location: [facility];  Service: General;  Laterality: N/A;   COLONOSCOPY     removed polyps   lower back surgery      PARATHYROIDECTOMY Left 08/20/2022   Procedure: LEFT PARATHYROIDECTOMY;  Surgeon: Darnell Level, MD;  Location: [facility];  Service: General;  Laterality: Left;   SIGMOIDOSCOPY     Patient Active Problem List   Diagnosis Date Noted   COVID-19 virus infection 03/28/2023   Paresthesia 12/16/2022   Right shoulder pain 12/16/2022   Acute recurrent cystitis 07/04/2022   Spondylolisthesis of lumbar region 05/21/2022   Elevated hemoglobin (HCC) 11/07/2021   Swelling of right index finger 11/06/2021   Vaginal discharge 01/06/2021   Urinary frequency 01/06/2021   Hyperparathyroidism, primary (HCC) 12/11/2020   Health care maintenance 01/23/2018   Macular degeneration 01/23/2018   Low back pain 12/29/2015   Advance care planning  12/14/2014   Vitamin D deficiency 12/14/2014   Impacted cerumen of left ear 01/29/2014   Hypercalcemia 12/01/2013   Hyperglycemia 10/02/2012   Medicare annual wellness visit, subsequent 10/02/2012   Leg pain 07/09/2012   Upper respiratory tract infection 11/03/2010   Gout    HLD (hyperlipidemia)    Hypothyroidism 01/10/2007   HYPERTENSION, BENIGN ESSENTIAL 01/10/2007   MENOPAUSAL SYNDROME 01/10/2007   Osteoarthritis 01/10/2007    PCP: Para March   REFERRING PROVIDER: Cordelia Pen   REFERRING DIAG: urinary frequency and urgency   Rationale for Evaluation and Treatment Rehabilitation  THERAPY DIAG:  No diagnosis found.  ONSET DATE:   SUBJECTIVE:      SUBJECTIVE STATEMENT TODAY:    Pt reports her LBP has increased from 5/10 to 7/10 during lifting and moving items in the house. Urinary frequency is 1x hour .  Pt has increased her water to total 48 fl oz.    SUBJECTIVE STATEMENT ON EVAL 02/19/23: 1) urinary frequency and urgency:   pt goes to the toilet once every 2 hours during day, every hour at night ( bedtime 11pm, wakes up 7am, drink wine 7pm, pt does not take any direutics) . Pt has to urinate again after leaving house 20 -30 min. Daily fluid intake:  16 fl oz water, 32 floz unsweeten tea, no soda, no coffee,  10 oz of wine per night.  SUI with sneezing   2) CLBP :  Hx of spinal fusion 2023. 6/10 pain after standing to cook for > 10 min.  Denied radiating pain   3) R shoulder pain:  since a fall in Jan 2024:  not able to fold laundry with lateral movement to the R, reaching up to put away dishes, to do hair, turning steering wheel in car  occurs with 4/10 . Denied radiating . Sleeps on both sides but no pillow between knees   PERTINENT HISTORY:    PAIN:  Are you having pain? Yes: see above   PRECAUTIONS: None  WEIGHT BEARING RESTRICTIONS: No  FALLS:  Has patient fallen in last 6 months? No  LIVING ENVIRONMENT: Lives with: lives with their spouse Lives in: house  Stairs:  3 STE with rail  Has following equipment at home: No  OCCUPATION: Programmer, systems , hobbies: reading, cooking, going out with friends, card games, sewing   PLOF: Independent  PATIENT GOALS:  Pain free in shoulder, resume lifestyles includes walking , going to gym, participating in social function    OBJECTIVE:     [facility] PT Assessment - 04/30/23 1516       Coordination   Coordination and Movement Description poor diassociation of trunk / LKC in multidifis seated, poor cervicoscapular coordination in new HEP      Ambulation/Gait   Gait Comments 1.39 m/s , reciprocal gait             [facility] Adult PT Treatment/Exercise - 04/30/23 1516       Therapeutic Activites    Other Therapeutic Activities modified resistance band HEP to minimize shoulder pain while still providing scapular mobility enhancement      Neuro Re-ed    Neuro Re-ed Details  cued for cervicoscapular retraction, cued for less upper trap overuse in new HEP, cued for diassociaiton of trunk and pelvis  / [facility]      Exercises   Exercises Other Exercises    Other Exercises  see pt instructions               HOME EXERCISE PROGRAM: See pt instruction section    ASSESSMENT:  CLINICAL IMPRESSION:  Pt has reported compliance with increased water intake.   Added cervical scapular system and multifidus strengthening with excessive tactile and verbal cues.  Pt showed less upper trap overuse and more coordination with cervicoscapular retraction post training.  Less upper trap overuse will help relax pelvic floor / optimize diaphragmatic / deep core system which will help with urinary decreasing frequency.  Guided and modified HEP to minimize R shoulder pain with resistance band and downgraded to red band instead of green as needed. Pt reported no increased shoulder pain with new modifications.   Plan to assess pelvic floor to address pelvic issues.   Pt benefits from skilled PT.    OBJECTIVE IMPAIRMENTS decreased  activity tolerance, decreased coordination, decreased endurance, decreased mobility, difficulty walking, decreased ROM, decreased strength, decreased safety awareness, hypomobility, increased muscle spasms, impaired flexibility, improper body mechanics, postural dysfunction, and pain. scar restrictions   ACTIVITY LIMITATIONS  self-care,  sleep, home chores, work tasks    PARTICIPATION LIMITATIONS:  community, ADLs, activities    PERSONAL FACTORS       are also affecting patient's functional outcome.    REHAB POTENTIAL: Good   CLINICAL DECISION MAKING: Evolving/moderate complexity   EVALUATION COMPLEXITY: Moderate    PATIENT EDUCATION:    Education details: Showed pt  anatomy images. Explained muscles attachments/ connection, physiology of deep core system/ spinal- thoracic-pelvis-lower kinetic chain as they relate to pt's presentation, Sx, and past Hx. Explained what and how these areas of deficits need to be restored to balance and function    See Therapeutic activity / neuromuscular re-education section  Answered pt's questions.   Person educated: Patient Education method: Explanation, Demonstration, Tactile cues, Verbal cues, and Handouts Education comprehension: verbalized understanding, returned demonstration, verbal cues required, tactile cues required, and needs further education     PLAN: PT FREQUENCY: 1x/week   PT DURATION: 10 weeks   PLANNED INTERVENTIONS: Therapeutic exercises, Therapeutic activity, Neuromuscular re-education, Balance training, Gait training, Patient/Family education, Self Care, Joint mobilization, Spinal mobilization, Moist heat, Taping, and Manual therapy, dry needling.   PLAN FOR NEXT SESSION: See clinical impression for plan     GOALS: Goals reviewed with patient? Yes  SHORT TERM GOALS: Target date: 03/19/2023    Pt will demo IND with HEP                    Baseline: Not IND            Goal status: MET    LONG TERM GOALS: Target date:  07/02/2023    1.Pt will demo proper deep core coordination without chest breathing and optimal excursion of diaphragm/pelvic floor in order to promote spinal stability and pelvic floor function  Baseline: dyscoordination Goal status: INITIAL  2.  Pt will demo > 5 pt change on FOTO  to improve QOL and function  PFDI Urinary baseline - 29 Lower score = better function    PFDI Prolapse  29    Lower score = better function   Pelvic Pain baseline - 8 Lower score = better function    PFDI Bowel -33 Higher score = better function  Lumber baseline  - 50 Higher score = better function   Goal status: INITIAL  3.  Pt will demo proper body mechanics in against gravity tasks and ADLs  work tasks, fitness  to minimize straining pelvic floor / back    Baseline: not IND, improper form that places strain on pelvic floor  Goal status: Ongoing     4. Pt will demo increased gait speed > 1.4 m/s with reciprocal gait pattern, longer stride length  in order to ambulate safely in community and return to fitness routine  Baseline: R shoulder. iliac crest lowered , 1.29 m/s, limited posterior rotation of R shoulder and pelvis , excessive sway to L  Goal status:MET ( 1.39 m/s)     5. Pt reports < 3/10 pain after standing to cook for > 10 min.   Baseline:  6/10 pain after standing to cook for > 10 min.   Goal status: MET ( 10/22: able to cook for > 15 min before needing to sit down)    6. Pt will regain shoulder flexion to > 135 deg and reach behind head is with digit III at T1 in order to fix her hair and to able to fold laundry with lateral movement to the R, reaching up to put away dishes, to do hair, turning steering wheel in car  Baseline: Shoulder 90 deg flexion R, L 135 deg  , hands behind head digit III at top of ear , pain with able to fold laundry with lateral movement to the R, reaching up to put away dishes, to do hair, turning steering wheel in car  Goal status: Ongoing  7. Pt  will report increase water intake from 16 fl oz to > 48 fl oz  and decrease bladder irritants ( wine from 10 oz per night to < 5 oz night) , tea 32 fl oz to < 16 fl oz  Baseline:  Daily fluid intake:  16 fl oz water, 32 floz unsweeten tea, no soda, no coffee, 10 oz of wine per night   Goal status: MET ( 48 fl oz water, 16 fl oz tea)    8. Pt will follow with PCP for sleep study to rule in/ out OSA  Baseline: nocturia:  every hours between 11pm to 7am, husband tells her she snores, pt with Hx high BP, cholesterol   Goal status: Deferred  Pt received information about link between nocturia and OSA Pt nocturia decreased from every hour to 2-3 x / night.    Mariane Masters, PT, DPT, E-RYT Pelvic Health Physical Therapist [email or web address] [phone]

## 2023-04-30 NOTE — Patient Instructions (Signed)
Multifidis twist   Band is on doorknob: sit facing perpendicular to door , sit halfway towards front of chair, firm through 4 points of contact at buttocks and feet. Feet are placed hip with apart.   Twisting trunk without moving the hips and knees Hold band at the level of ribcage, elbows bent,shoulder blades roll back and down like squeezing a pencil under armpit   Exhale twist,.10-15 deg away from door without moving your hips/ knees, press more weight on the side of the sitting bones/ foot opp of your direction of turn as your counterweight. Continue to maintain equal weight through legs.  Keep knee unlocked.  30 reps   __  Place band in "U"    band under ballmounds  while laying on back w/ knees bent   Lying on back, knees bent    band under ballmounds  while laying on back w/ knees bent  "UPside down V " exercise  20 reps  ( MAKE SURE BEFORE THE POINT OF R SHOULDER PAIN - SLIGHT MOVEMENT),   Band is placed under feet, knees bent, feet are hip width apart Hold band with thumbs point out, keep upper arm and elbow touching the bed the whole time  - inhale and then exhale pull bands by bending elbows hands move in a "w"  (feel shoulder blades squeezing)   ________________   Oblique/ scapula stabilization   Opposite arm   Place band in "U"    band under ballmounds  while laying on back w/ knees bent     20 reps  on each side  Holding band from opposite thigh,  Inhale, exhale,  ( MAKE SURE BEFORE THE POINT OF R SHOULDER PAIN - SLIGHT MOVEMENT),

## 2023-05-04 ENCOUNTER — Other Ambulatory Visit: Payer: Self-pay | Admitting: Family Medicine

## 2023-05-07 ENCOUNTER — Ambulatory Visit: Payer: PPO | Attending: Obstetrics and Gynecology | Admitting: Physical Therapy

## 2023-05-07 DIAGNOSIS — R2689 Other abnormalities of gait and mobility: Secondary | ICD-10-CM | POA: Diagnosis not present

## 2023-05-07 DIAGNOSIS — M5459 Other low back pain: Secondary | ICD-10-CM | POA: Diagnosis not present

## 2023-05-07 DIAGNOSIS — G8929 Other chronic pain: Secondary | ICD-10-CM | POA: Insufficient documentation

## 2023-05-07 DIAGNOSIS — M25611 Stiffness of right shoulder, not elsewhere classified: Secondary | ICD-10-CM | POA: Insufficient documentation

## 2023-05-07 DIAGNOSIS — M25511 Pain in right shoulder: Secondary | ICD-10-CM | POA: Insufficient documentation

## 2023-05-07 DIAGNOSIS — M533 Sacrococcygeal disorders, not elsewhere classified: Secondary | ICD-10-CM | POA: Diagnosis not present

## 2023-05-07 DIAGNOSIS — M6281 Muscle weakness (generalized): Secondary | ICD-10-CM | POA: Insufficient documentation

## 2023-05-07 DIAGNOSIS — R278 Other lack of coordination: Secondary | ICD-10-CM | POA: Diagnosis not present

## 2023-05-07 NOTE — Therapy (Unsigned)
OUTPATIENT PHYSICAL THERAPY TREATMENT   Patient Name: Mercedes [name] MRN: [id number] DOB:September 12, 1945, 77 y.o., female Today's Date: 05/07/2023   PT End of Session - 05/07/23 1512     Visit Number 9    Number of Visits 17    Date for PT Re-Evaluation 07/02/23    PT Start Time 1505    PT Stop Time 1545    PT Time Calculation (min) 40 min    Activity Tolerance Patient tolerated treatment well;No increased pain    Behavior During Therapy WFL for tasks assessed/performed             Past Medical History:  Diagnosis Date   Allergy    Cataract    bilateral-not an issue right now   Diverticulosis of colon    Fatty liver    Gout    HLD (hyperlipidemia)    under control   HTN (hypertension)    Hypothyroidism    Macular degeneration    Osteoarthritis    Rapid heartbeat    occasional, does not last long   Tobacco abuse    Past Surgical History:  Procedure Laterality Date   CATARACT EXTRACTION     CHOLECYSTECTOMY N/A 03/17/2014   Procedure: LAPAROSCOPIC CHOLECYSTECTOMY WITH INTRAOPERATIVE CHOLANGIOGRAM ;  Surgeon: Claud Kelp, MD;  Location: [facility];  Service: General;  Laterality: N/A;   COLONOSCOPY     removed polyps   lower back surgery      PARATHYROIDECTOMY Left 08/20/2022   Procedure: LEFT PARATHYROIDECTOMY;  Surgeon: Darnell Level, MD;  Location: [facility];  Service: General;  Laterality: Left;   SIGMOIDOSCOPY     Patient Active Problem List   Diagnosis Date Noted   COVID-19 virus infection 03/28/2023   Paresthesia 12/16/2022   Right shoulder pain 12/16/2022   Acute recurrent cystitis 07/04/2022   Spondylolisthesis of lumbar region 05/21/2022   Elevated hemoglobin (HCC) 11/07/2021   Swelling of right index finger 11/06/2021   Vaginal discharge 01/06/2021   Urinary frequency 01/06/2021   Hyperparathyroidism, primary (HCC) 12/11/2020   Health care maintenance 01/23/2018   Macular degeneration 01/23/2018   Low back pain 12/29/2015   Advance care planning  12/14/2014   Vitamin D deficiency 12/14/2014   Impacted cerumen of left ear 01/29/2014   Hypercalcemia 12/01/2013   Hyperglycemia 10/02/2012   Medicare annual wellness visit, subsequent 10/02/2012   Leg pain 07/09/2012   Upper respiratory tract infection 11/03/2010   Gout    HLD (hyperlipidemia)    Hypothyroidism 01/10/2007   HYPERTENSION, BENIGN ESSENTIAL 01/10/2007   MENOPAUSAL SYNDROME 01/10/2007   Osteoarthritis 01/10/2007    PCP: Para March   REFERRING PROVIDER: Cordelia Pen   REFERRING DIAG: urinary frequency and urgency   Rationale for Evaluation and Treatment Rehabilitation  THERAPY DIAG:  Sacrococcygeal disorders, not elsewhere classified  Other low back pain  Other lack of coordination  Chronic right shoulder pain  Other abnormalities of gait and mobility  ONSET DATE:   SUBJECTIVE:      SUBJECTIVE STATEMENT TODAY:    Pt reports the band exercise caused shoulder pain.   Pt still hears her neck grating sounds with neck exercise but no pain.  Urgency is better  Frequency is occurring 90 min to 2 hours during the night .   pt goes to the toilet once every 2 hours during day.    SUBJECTIVE STATEMENT ON EVAL 02/19/23: 1) urinary frequency and urgency:   pt goes to the toilet once every 2 hours during day, every hour at night (  bedtime 11pm, wakes up 7am, drink wine 7pm, pt does not take any direutics) . Pt has to urinate again after leaving house 20 -30 min. Daily fluid intake:  16 fl oz water, 32 floz unsweeten tea, no soda, no coffee, 10 oz of wine per night.  SUI with sneezing   2) CLBP :  Hx of spinal fusion 2023. 6/10 pain after standing to cook for > 10 min.  Denied radiating pain   3) R shoulder pain:  since a fall in Jan 2024:  not able to fold laundry with lateral movement to the R, reaching up to put away dishes, to do hair, turning steering wheel in car  occurs with 4/10 . Denied radiating . Sleeps on both sides but no pillow between knees   PERTINENT  HISTORY:    PAIN:  Are you having pain? Yes: see above   PRECAUTIONS: None  WEIGHT BEARING RESTRICTIONS: No  FALLS:  Has patient fallen in last 6 months? No  LIVING ENVIRONMENT: Lives with: lives with their spouse Lives in: house  Stairs: 3 STE with rail  Has following equipment at home: No  OCCUPATION: Programmer, systems , hobbies: reading, cooking, going out with friends, card games, sewing   PLOF: Independent  PATIENT GOALS:  Pain free in shoulder, resume lifestyles includes walking , going to gym, participating in social function    OBJECTIVE:    [facility] PT Assessment - 05/08/23 0844       Coordination   Coordination and Movement Description less upper trap overuse with RLE, less thoracic kyphosis, in supine: pt demo'd shoulder abduction 30 deg with ability to place RUE flat, less rounded shoulders and guarding tendency on plinth      Palpation   SI assessment  Levelled pelvic girdle, R shoulder higher             [facility] Adult PT Treatment/Exercise - 05/08/23 0841       Therapeutic Activites    Other Therapeutic Activities explained working with ortho PT for Nov-Dec for shoulder and return to Pelvic PT in Jan-Feb , reinforced continuation of deep coreHEP and neck/ shoulder HEP to maintain good posture      Neuro Re-ed    Neuro Re-ed Details  cued for proper technique for sit to stand with knee and feet alignment      Manual Therapy   Manual therapy comments long axis distance RLE, STM/MWM to promote ER of R hip,knee, .               HOME EXERCISE PROGRAM: See pt instruction section    ASSESSMENT:  CLINICAL IMPRESSION:    Pt showed less upper trap overuse and more coordination with cervicoscapular retraction today which is good cvarry over from last session. Discontinued band exercise  due to  shoulder pain.   Less upper trap overuse will help relax pelvic floor / optimize diaphragmatic / deep core system which will help with urinary decreasing frequency.    In standing, pt demo much less forward head posture and thoracic kyphosis and less overuse of upper trap. In supine: pt demo'd shoulder abduction 30 deg with ability to place RUE flat, less rounded shoulders and guarding tendency on plinth which are structural improvements since [facility].  These improvements will help pt continue to make further improvements when she works with ortho PT who will focus only on her shoulder deficits for the next 2 months ( Nov-Dec)  before pt returns to pelvic PT in Jan and Feb 2025.  Assessed pelvic floor today which showed hypomobility at R SIJ, LKC, and tightness at R pelvic floor. Pt showed improved mobility post Tx. With less upper trap overuse 2/2 shoulder injury and improved deep core coordination of pelvic floor , pt's urinary urgency and daytime urinary frequency improved per her report. P t goes to the toilet once every 2 hours during day which is normal.    However, night time frequency is occurring 90 min to 2 hours during the night .  Reinforced with pt the importance to rule in /out OSA as nocturia and frequent voiding in the night is a risk factor.  Pt is scheduled to return to Pelvic PT to address urinary issues after she completes two months of shoulder rehab with ortho PT.   Pt benefits from skilled PT.    OBJECTIVE IMPAIRMENTS decreased activity tolerance, decreased coordination, decreased endurance, decreased mobility, difficulty walking, decreased ROM, decreased strength, decreased safety awareness, hypomobility, increased muscle spasms, impaired flexibility, improper body mechanics, postural dysfunction, and pain. scar restrictions   ACTIVITY LIMITATIONS  self-care,  sleep, home chores, work tasks    PARTICIPATION LIMITATIONS:  community, ADLs, activities    PERSONAL FACTORS       are also affecting patient's functional outcome.    REHAB POTENTIAL: Good   CLINICAL DECISION MAKING: Evolving/moderate complexity   EVALUATION COMPLEXITY:  Moderate    PATIENT EDUCATION:    Education details: Showed pt anatomy images. Explained muscles attachments/ connection, physiology of deep core system/ spinal- thoracic-pelvis-lower kinetic chain as they relate to pt's presentation, Sx, and past Hx. Explained what and how these areas of deficits need to be restored to balance and function    See Therapeutic activity / neuromuscular re-education section  Answered pt's questions.   Person educated: Patient Education method: Explanation, Demonstration, Tactile cues, Verbal cues, and Handouts Education comprehension: verbalized understanding, returned demonstration, verbal cues required, tactile cues required, and needs further education     PLAN: PT FREQUENCY: 1x/week   PT DURATION: 10 weeks   PLANNED INTERVENTIONS: Therapeutic exercises, Therapeutic activity, Neuromuscular re-education, Balance training, Gait training, Patient/Family education, Self Care, Joint mobilization, Spinal mobilization, Moist heat, Taping, and Manual therapy, dry needling.   PLAN FOR NEXT SESSION: See clinical impression for plan     GOALS: Goals reviewed with patient? Yes  SHORT TERM GOALS: Target date: 03/19/2023    Pt will demo IND with HEP                    Baseline: Not IND            Goal status: MET    LONG TERM GOALS: Target date: 07/02/2023    1.Pt will demo proper deep core coordination without chest breathing and optimal excursion of diaphragm/pelvic floor in order to promote spinal stability and pelvic floor function  Baseline: dyscoordination Goal status: INITIAL  2.  Pt will demo > 5 pt change on FOTO  to improve QOL and function  PFDI Urinary baseline - 29 Lower score = better function    PFDI Prolapse  29    Lower score = better function   Pelvic Pain baseline - 8 Lower score = better function    PFDI Bowel -33 Higher score = better function  Lumber baseline  - 50 Higher score = better function   Goal  status: INITIAL  3.  Pt will demo proper body mechanics in against gravity tasks and ADLs  work tasks, fitness  to minimize straining pelvic  floor / back    Baseline: not IND, improper form that places strain on pelvic floor  Goal status: Ongoing     4. Pt will demo increased gait speed > 1.4 m/s with reciprocal gait pattern, longer stride length  in order to ambulate safely in community and return to fitness routine  Baseline: R shoulder. iliac crest lowered , 1.29 m/s, limited posterior rotation of R shoulder and pelvis , excessive sway to L  Goal status:MET ( 1.39 m/s)     5. Pt reports < 3/10 pain after standing to cook for > 10 min.   Baseline:  6/10 pain after standing to cook for > 10 min.   Goal status: MET ( 10/22: able to cook for > 15 min before needing to sit down)    6. Pt will regain shoulder flexion to > 135 deg and reach behind head is with digit III at T1 in order to fix her hair and to able to fold laundry with lateral movement to the R, reaching up to put away dishes, to do hair, turning steering wheel in car  Baseline: Shoulder 90 deg flexion R, L 135 deg  , hands behind head digit III at top of ear , pain with able to fold laundry with lateral movement to the R, reaching up to put away dishes, to do hair, turning steering wheel in car  Goal status: Ongoing    7. Pt will report increase water intake from 16 fl oz to > 48 fl oz  and decrease bladder irritants ( wine from 10 oz per night to < 5 oz night) , tea 32 fl oz to < 16 fl oz  Baseline:  Daily fluid intake:  16 fl oz water, 32 floz unsweeten tea, no soda, no coffee, 10 oz of wine per night   Goal status: MET ( 48 fl oz water, 16 fl oz tea)    8. Pt will follow with PCP for sleep study to rule in/ out OSA  Baseline: nocturia:  every hours between 11pm to 7am, husband tells her she snores, pt with Hx high BP, cholesterol   Goal status: Deferred  Pt received information about link between nocturia and OSA Pt  nocturia decreased from every hour to 2-3 x / night.    Mariane Masters, PT, DPT, E-RYT Pelvic Health Physical Therapist [email or web address] [phone]

## 2023-05-16 ENCOUNTER — Ambulatory Visit: Payer: PPO

## 2023-05-22 ENCOUNTER — Ambulatory Visit: Payer: PPO

## 2023-05-22 DIAGNOSIS — M533 Sacrococcygeal disorders, not elsewhere classified: Secondary | ICD-10-CM | POA: Diagnosis not present

## 2023-05-22 DIAGNOSIS — M25611 Stiffness of right shoulder, not elsewhere classified: Secondary | ICD-10-CM

## 2023-05-22 DIAGNOSIS — G8929 Other chronic pain: Secondary | ICD-10-CM

## 2023-05-22 DIAGNOSIS — M6281 Muscle weakness (generalized): Secondary | ICD-10-CM

## 2023-05-22 NOTE — Therapy (Unsigned)
OUTPATIENT PHYSICAL THERAPY TREATMENT   Patient Name: Mercedes [name] MRN: [id number] DOB:Nov 24, 1945, 77 y.o., female Today's Date: 05/22/2023     Past Medical History:  Diagnosis Date   Allergy    Cataract    bilateral-not an issue right now   Diverticulosis of colon    Fatty liver    Gout    HLD (hyperlipidemia)    under control   HTN (hypertension)    Hypothyroidism    Macular degeneration    Osteoarthritis    Rapid heartbeat    occasional, does not last long   Tobacco abuse    Past Surgical History:  Procedure Laterality Date   CATARACT EXTRACTION     CHOLECYSTECTOMY N/A 03/17/2014   Procedure: LAPAROSCOPIC CHOLECYSTECTOMY WITH INTRAOPERATIVE CHOLANGIOGRAM ;  Surgeon: Claud Kelp, MD;  Location: [facility];  Service: General;  Laterality: N/A;   COLONOSCOPY     removed polyps   lower back surgery      PARATHYROIDECTOMY Left 08/20/2022   Procedure: LEFT PARATHYROIDECTOMY;  Surgeon: Darnell Level, MD;  Location: [facility];  Service: General;  Laterality: Left;   SIGMOIDOSCOPY     Patient Active Problem List   Diagnosis Date Noted   COVID-19 virus infection 03/28/2023   Paresthesia 12/16/2022   Right shoulder pain 12/16/2022   Acute recurrent cystitis 07/04/2022   Spondylolisthesis of lumbar region 05/21/2022   Elevated hemoglobin (HCC) 11/07/2021   Swelling of right index finger 11/06/2021   Vaginal discharge 01/06/2021   Urinary frequency 01/06/2021   Hyperparathyroidism, primary (HCC) 12/11/2020   Health care maintenance 01/23/2018   Macular degeneration 01/23/2018   Low back pain 12/29/2015   Advance care planning 12/14/2014   Vitamin D deficiency 12/14/2014   Impacted cerumen of left ear 01/29/2014   Hypercalcemia 12/01/2013   Hyperglycemia 10/02/2012   Medicare annual wellness visit, subsequent 10/02/2012   Leg pain 07/09/2012   Upper respiratory tract infection 11/03/2010   Gout    HLD (hyperlipidemia)    Hypothyroidism 01/10/2007   HYPERTENSION,  BENIGN ESSENTIAL 01/10/2007   MENOPAUSAL SYNDROME 01/10/2007   Osteoarthritis 01/10/2007    PCP: Para March   REFERRING PROVIDER: Cordelia Pen   REFERRING DIAG: urinary frequency and urgency   Rationale for Evaluation and Treatment Rehabilitation  THERAPY DIAG:  No diagnosis found.  ONSET DATE:   SUBJECTIVE:      SUBJECTIVE STATEMENT TODAY:    Pt reports fall in January and reports in order to protect her back from recent back surgery she landed on R shoulder. She reports xrays did not show anything, PT was suggested. Pt has had two cortisone injections in R shoulder, but didn't help much. Pt reports pain R along R proximal and distal bicep with movement. Pt unable to reach top of her head an behind herhead with RUE. Pt reports she hasn't found anything that helps, not ice or heat. It has improved since January. No burning, n/t.  Pt describes pain as sharp, fleeting pain. Continued movement doesn't help, just hurts. This is patient's dominant R side.  Recent imaging found the following 12/13/22: " FINDINGS: Mild AC joint and glenohumeral degenerative changes without significant loss of joint space. No fracture or dislocation. No other bony or soft tissue abnormalities identified.   IMPRESSION: Mild AC joint and glenohumeral degenerative changes.     Electronically Signed   By: Gerome Sam III M.D.   On: 12/15/2022 10:43"  SUBJECTIVE STATEMENT ON EVAL 02/19/23: 1) urinary frequency and urgency:   pt goes to the toilet  once every 2 hours during day, every hour at night ( bedtime 11pm, wakes up 7am, drink wine 7pm, pt does not take any direutics) . Pt has to urinate again after leaving house 20 -30 min. Daily fluid intake:  16 fl oz water, 32 floz unsweeten tea, no soda, no coffee, 10 oz of wine per night.  SUI with sneezing   2) CLBP :  Hx of spinal fusion 2023. 6/10 pain after standing to cook for > 10 min.  Denied radiating pain   3) R shoulder pain:  since a fall in Jan 2024:  not  able to fold laundry with lateral movement to the R, reaching up to put away dishes, to do hair, turning steering wheel in car  occurs with 4/10 . Denied radiating . Sleeps on both sides but no pillow between knees   PERTINENT HISTORY:    PAIN:  Are you having pain? Yes: see above   PRECAUTIONS: None  WEIGHT BEARING RESTRICTIONS: No  FALLS:  Has patient fallen in last 6 months? No  LIVING ENVIRONMENT: Lives with: lives with their spouse Lives in: house  Stairs: 3 STE with rail  Has following equipment at home: No  OCCUPATION: Programmer, systems , hobbies: reading, cooking, going out with friends, card games, sewing   PLOF: Independent  PATIENT GOALS:  Pain free in shoulder, resume lifestyles includes walking , going to gym, participating in social function    OBJECTIVE:    [facility] PT Assessment - 05/08/23 0844       Coordination   Coordination and Movement Description less upper trap overuse with RLE, less thoracic kyphosis, in supine: pt demo'd shoulder abduction 30 deg with ability to place RUE flat, less rounded shoulders and guarding tendency on plinth      Palpation   SI assessment  Levelled pelvic girdle, R shoulder higher             TODAYs TREATMENT: 05/22/23  MMT: Unable to elicit mm contraction with R external rotation (pain-free), general weakness and pain with flex/abduction 4-/5. Gross strength 4/5 RUE  LUE 5/5  Grip strength 5/5 bilat   LUE: Flexion 130 deg Abduction 130 deg  ER: 60   RUE: Flexion: 96 deg Abd: 75 deg ER: 15 deg  - pain limited   Palpation - pain free   Hawkins-Kennedy: negative   MRI for R RTC?   Supine shoulder isometric: RUE Flexion 10x with 3 sec hold/rep - easy PVC hooklye shoulder ER 10x Supine R shoulder ER AROM 10x Standing towel isometric ER: 2x10 with 3 sec hold/rep R pendulum 2x10 CW/CC  Access Code: [email or web address] URL: [email or web address] Date: 05/22/2023 Prepared by: Temple Pacini  Exercises -  Standing Isometric Shoulder External Rotation with Doorway  - 1 x daily - 7 x weekly - 2 sets - 10 reps - 3 sec hold/rep hold - Circular Shoulder Pendulum with Table Support  - 1 x daily - 7 x weekly - 2 sets - 10 reps   HOME EXERCISE PROGRAM: See pt instruction section    ASSESSMENT:  OBJECTIVE IMPAIRMENTS decreased activity tolerance, decreased coordination, decreased endurance, decreased mobility, difficulty walking, decreased ROM, decreased strength, decreased safety awareness, hypomobility, increased muscle spasms, impaired flexibility, improper body mechanics, postural dysfunction, and pain. scar restrictions   ACTIVITY LIMITATIONS  self-care,  sleep, home chores, work tasks    PARTICIPATION LIMITATIONS:  community, ADLs, activities    PERSONAL FACTORS       are also affecting patient's functional outcome.  REHAB POTENTIAL: Good   CLINICAL DECISION MAKING: Evolving/moderate complexity   EVALUATION COMPLEXITY: Moderate    PATIENT EDUCATION:    Education details: Showed pt anatomy images. Explained muscles attachments/ connection, physiology of deep core system/ spinal- thoracic-pelvis-lower kinetic chain as they relate to pt's presentation, Sx, and past Hx. Explained what and how these areas of deficits need to be restored to balance and function    See Therapeutic activity / neuromuscular re-education section  Answered pt's questions.   Person educated: Patient Education method: Explanation, Demonstration, Tactile cues, Verbal cues, and Handouts Education comprehension: verbalized understanding, returned demonstration, verbal cues required, tactile cues required, and needs further education     PLAN: PT FREQUENCY: 1x/week   PT DURATION: 10 weeks   PLANNED INTERVENTIONS: Therapeutic exercises, Therapeutic activity, Neuromuscular re-education, Balance training, Gait training, Patient/Family education, Self Care, Joint mobilization, Spinal mobilization, Moist heat,  Taping, and Manual therapy, dry needling.   PLAN FOR NEXT SESSION: See clinical impression for plan     GOALS: Goals reviewed with patient? Yes  SHORT TERM GOALS: Target date: 03/19/2023    Pt will demo IND with HEP                    Baseline: Not IND            Goal status: MET    LONG TERM GOALS: Target date: 07/02/2023    1.Pt will demo proper deep core coordination without chest breathing and optimal excursion of diaphragm/pelvic floor in order to promote spinal stability and pelvic floor function  Baseline: dyscoordination Goal status: INITIAL  2.  Pt will demo > 5 pt change on FOTO  to improve QOL and function  PFDI Urinary baseline - 29 Lower score = better function    PFDI Prolapse  29    Lower score = better function   Pelvic Pain baseline - 8 Lower score = better function    PFDI Bowel -33 Higher score = better function  Lumber baseline  - 50 Higher score = better function   Goal status: INITIAL  3.  Pt will demo proper body mechanics in against gravity tasks and ADLs  work tasks, fitness  to minimize straining pelvic floor / back    Baseline: not IND, improper form that places strain on pelvic floor  Goal status: Ongoing     4. Pt will demo increased gait speed > 1.4 m/s with reciprocal gait pattern, longer stride length  in order to ambulate safely in community and return to fitness routine  Baseline: R shoulder. iliac crest lowered , 1.29 m/s, limited posterior rotation of R shoulder and pelvis , excessive sway to L  Goal status:MET ( 1.39 m/s)     5. Pt reports < 3/10 pain after standing to cook for > 10 min.   Baseline:  6/10 pain after standing to cook for > 10 min.   Goal status: MET ( 10/22: able to cook for > 15 min before needing to sit down)    6. Pt will regain shoulder flexion to > 135 deg and reach behind head is with digit III at T1 in order to fix her hair and to able to fold laundry with lateral movement to the R, reaching  up to put away dishes, to do hair, turning steering wheel in car  Baseline: Shoulder 90 deg flexion R, L 135 deg  , hands behind head digit III at top of ear , pain with able to fold  laundry with lateral movement to the R, reaching up to put away dishes, to do hair, turning steering wheel in car  Goal status: Ongoing    7. Pt will report increase water intake from 16 fl oz to > 48 fl oz  and decrease bladder irritants ( wine from 10 oz per night to < 5 oz night) , tea 32 fl oz to < 16 fl oz  Baseline:  Daily fluid intake:  16 fl oz water, 32 floz unsweeten tea, no soda, no coffee, 10 oz of wine per night   Goal status: MET ( 48 fl oz water, 16 fl oz tea)    8. Pt will follow with PCP for sleep study to rule in/ out OSA  Baseline: nocturia:  every hours between 11pm to 7am, husband tells her she snores, pt with Hx high BP, cholesterol   Goal status: Deferred  Pt received information about link between nocturia and OSA Pt nocturia decreased from every hour to 2-3 x / night.    Mariane Masters, PT, DPT, E-RYT Pelvic Health Physical Therapist [email or web address] [phone]

## 2023-05-25 ENCOUNTER — Other Ambulatory Visit: Payer: Self-pay | Admitting: Family Medicine

## 2023-05-27 DIAGNOSIS — H353221 Exudative age-related macular degeneration, left eye, with active choroidal neovascularization: Secondary | ICD-10-CM | POA: Diagnosis not present

## 2023-06-05 ENCOUNTER — Ambulatory Visit: Payer: PPO | Attending: Obstetrics and Gynecology

## 2023-06-05 DIAGNOSIS — M25511 Pain in right shoulder: Secondary | ICD-10-CM | POA: Diagnosis not present

## 2023-06-05 DIAGNOSIS — G8929 Other chronic pain: Secondary | ICD-10-CM | POA: Diagnosis not present

## 2023-06-05 DIAGNOSIS — M25611 Stiffness of right shoulder, not elsewhere classified: Secondary | ICD-10-CM | POA: Insufficient documentation

## 2023-06-05 DIAGNOSIS — M6281 Muscle weakness (generalized): Secondary | ICD-10-CM | POA: Insufficient documentation

## 2023-06-05 NOTE — Therapy (Signed)
OUTPATIENT PHYSICAL THERAPY SHOULDER TREATMENT   Patient Name: Mercedes [name] MRN: [id number] DOB:May 26, 1946, 77 y.o., female Today's Date: 06/05/2023   PT End of Session - 06/05/23 1311     Visit Number 11    Number of Visits 17    Date for PT Re-Evaluation 07/02/23    PT Start Time 1316    PT Stop Time 1358    PT Time Calculation (min) 42 min    Activity Tolerance Patient tolerated treatment well;Patient limited by pain    Behavior During Therapy WFL for tasks assessed/performed              Past Medical History:  Diagnosis Date   Allergy    Cataract    bilateral-not an issue right now   Diverticulosis of colon    Fatty liver    Gout    HLD (hyperlipidemia)    under control   HTN (hypertension)    Hypothyroidism    Macular degeneration    Osteoarthritis    Rapid heartbeat    occasional, does not last long   Tobacco abuse    Past Surgical History:  Procedure Laterality Date   CATARACT EXTRACTION     CHOLECYSTECTOMY N/A 03/17/2014   Procedure: LAPAROSCOPIC CHOLECYSTECTOMY WITH INTRAOPERATIVE CHOLANGIOGRAM ;  Surgeon: Claud Kelp, MD;  Location: [facility];  Service: General;  Laterality: N/A;   COLONOSCOPY     removed polyps   lower back surgery      PARATHYROIDECTOMY Left 08/20/2022   Procedure: LEFT PARATHYROIDECTOMY;  Surgeon: Darnell Level, MD;  Location: [facility];  Service: General;  Laterality: Left;   SIGMOIDOSCOPY     Patient Active Problem List   Diagnosis Date Noted   COVID-19 virus infection 03/28/2023   Paresthesia 12/16/2022   Right shoulder pain 12/16/2022   Acute recurrent cystitis 07/04/2022   Spondylolisthesis of lumbar region 05/21/2022   Elevated hemoglobin (HCC) 11/07/2021   Swelling of right index finger 11/06/2021   Vaginal discharge 01/06/2021   Urinary frequency 01/06/2021   Hyperparathyroidism, primary (HCC) 12/11/2020   Health care maintenance 01/23/2018   Macular degeneration 01/23/2018   Low back pain 12/29/2015   Advance  care planning 12/14/2014   Vitamin D deficiency 12/14/2014   Impacted cerumen of left ear 01/29/2014   Hypercalcemia 12/01/2013   Hyperglycemia 10/02/2012   Medicare annual wellness visit, subsequent 10/02/2012   Leg pain 07/09/2012   Upper respiratory tract infection 11/03/2010   Gout    HLD (hyperlipidemia)    Hypothyroidism 01/10/2007   HYPERTENSION, BENIGN ESSENTIAL 01/10/2007   MENOPAUSAL SYNDROME 01/10/2007   Osteoarthritis 01/10/2007    PCP: Para March   REFERRING PROVIDER: Cordelia Pen   REFERRING DIAG: urinary frequency and urgency   Rationale for Evaluation and Treatment Rehabilitation  THERAPY DIAG:  Chronic right shoulder pain  Stiffness of right shoulder, not elsewhere classified  Muscle weakness (generalized)  ONSET DATE:   SUBJECTIVE:      SUBJECTIVE STATEMENT TODAY:  Pt reports things have been going well. She says pain depends on how she moves her arm. She rates pain as 5/10 on avg with movement, but if she backs off from movement pain "is gone." HEP went well, feels it is improving her strength.  PERTINENT HX:    Pt reports fall in January where she injured her R shoulder. In order to protect her back from recent back surgery she landed on R shoulder. She reports xrays did not show anything, PT was suggested as tx. Pt has had two cortisone  injections in R shoulder, but says they didn't help much. Pt reports pain along R proximal and distal bicep with movement. Pt unable to reach top of her head an behind her head with RUE. Pt reports she hasn't found anything that helps, not ice or heat. It has improved since January but  has not resolved. No burning, n/t sx.  Pt describes pain as sharp, fleeting. Continued movement doesn't help, just hurts. This is patient's dominant arm.   Per chart, imaging found the following 12/13/22: " FINDINGS: Mild AC joint and glenohumeral degenerative changes without significant loss of joint space. No fracture or dislocation. No other  bony or soft tissue abnormalities identified.   IMPRESSION: Mild AC joint and glenohumeral degenerative changes.     Electronically Signed   By: Gerome Sam III M.D.   On: 12/15/2022 10:43"  SUBJECTIVE STATEMENT ON EVAL 02/19/23: 1) urinary frequency and urgency:   pt goes to the toilet once every 2 hours during day, every hour at night ( bedtime 11pm, wakes up 7am, drink wine 7pm, pt does not take any direutics) . Pt has to urinate again after leaving house 20 -30 min. Daily fluid intake:  16 fl oz water, 32 floz unsweeten tea, no soda, no coffee, 10 oz of wine per night.  SUI with sneezing   2) CLBP :  Hx of spinal fusion 2023. 6/10 pain after standing to cook for > 10 min.  Denied radiating pain   3) R shoulder pain:  since a fall in Jan 2024:  not able to fold laundry with lateral movement to the R, reaching up to put away dishes, to do hair, turning steering wheel in car  occurs with 4/10 . Denied radiating . Sleeps on both sides but no pillow between knees   PERTINENT HISTORY:    PAIN:  Are you having pain? Yes: see above   PRECAUTIONS: None  WEIGHT BEARING RESTRICTIONS: No  FALLS:  Has patient fallen in last 6 months? No  LIVING ENVIRONMENT: Lives with: lives with their spouse Lives in: house  Stairs: 3 STE with rail  Has following equipment at home: No  OCCUPATION: Programmer, systems , hobbies: reading, cooking, going out with friends, card games, sewing   PLOF: Independent  PATIENT GOALS:  Pain free in shoulder, resume lifestyles includes walking , going to gym, participating in social function    OBJECTIVE:    [facility] PT Assessment - 05/08/23 0844       Coordination   Coordination and Movement Description less upper trap overuse with RLE, less thoracic kyphosis, in supine: pt demo'd shoulder abduction 30 deg with ability to place RUE flat, less rounded shoulders and guarding tendency on plinth      Palpation   SI assessment  Levelled pelvic girdle, R shoulder  higher             TODAYs TREATMENT: 06/05/23  On Plinth: RUE focus  Hot pack applied to R posterior shoulder (pt reports nothing currently applied to skin that would prevent safe use of heat. No adverse reaction to heat observed or reported)  R shoulder flexion AROM 10x - completed through limited range R shoulder scaption 10x - difficult, feels like a weight is in the back of her arm   PVC AAROM scaption -  limited to 8x due to pain PVC AAROM shoulder flexion 1x10, 1x15 no pain. Rates easy  PVC AAROM shoulder abduction 15x - slight elbow flexion throughout, fatiguing  PVC AAROM shoulder ER  15x  Supine R shoulder IR/ER AROM 2x10  Supine shoulder flexion RUE AROM supine 10x -fatiguing  Seated shrugs 10x bilat - reports feels good to neck  Standing isometrics with towel roll RUE, 2x10 reps of the following: flexion, extension, ER/IR - HEP updated with interventions  Shoulder circles CW/CC 10x for each bilat   YTB supine shoulder ER bilat 10x - pt reports no pain. Completes 5 additional reps  Seated: UE ranger RUE horizontal abduction/adduction 10x each way  UE ranger RUE FWD/BCKWD 10x for each UE ranger CW/CC 10x for each    HEP updated and reviewed: Access Code: [email or web address] URL: [email or web address] Date: 06/05/2023 Prepared by: Temple Pacini  Exercises - Standing Isometric Shoulder External Rotation with Doorway  - 1 x daily - 7 x weekly - 2 sets - 10 reps - 3 sec hold/rep hold - Circular Shoulder Pendulum with Table Support  - 1 x daily - 7 x weekly - 2 sets - 10 reps - Standing Isometric Shoulder Internal Rotation at Doorway  - 1 x daily - 7 x weekly - 2 sets - 10 reps - 3 seconds hold - Isometric Shoulder Flexion at Wall  - 1 x daily - 7 x weekly - 2 sets - 10 reps - 3 seconds hold - Isometric Shoulder Extension at Wall  - 1 x daily - 7 x weekly - 2 sets - 10 reps - 3 seconds hold      HOME EXERCISE PROGRAM: Access Code: [email or web address] URL:  [email or web address] Date: 06/05/2023 Prepared by: Temple Pacini  Exercises - Standing Isometric Shoulder External Rotation with Doorway  - 1 x daily - 7 x weekly - 2 sets - 10 reps - 3 sec hold/rep hold - Circular Shoulder Pendulum with Table Support  - 1 x daily - 7 x weekly - 2 sets - 10 reps - Standing Isometric Shoulder Internal Rotation at Doorway  - 1 x daily - 7 x weekly - 2 sets - 10 reps - 3 seconds hold - Isometric Shoulder Flexion at Wall  - 1 x daily - 7 x weekly - 2 sets - 10 reps - 3 seconds hold - Isometric Shoulder Extension at Wall  - 1 x daily - 7 x weekly - 2 sets - 10 reps - 3 seconds hold   ASSESSMENT: Pt returns to PT with reports of tolerating HEP well. HEP advanced today, which pt was able to demonstrate pain-free and with correct technique during visit.  AROM and AAROM currently fatiguing and ROM still limited. The pt will benefit from further skilled therapy to address these deficits in order to improve functional mobility, QOL and reduce pain.  OBJECTIVE IMPAIRMENTS decreased activity tolerance, decreased coordination, decreased endurance, decreased mobility, difficulty walking, decreased ROM, decreased strength, decreased safety awareness, hypomobility, increased muscle spasms, impaired flexibility, improper body mechanics, postural dysfunction, and pain. scar restrictions   ACTIVITY LIMITATIONS  self-care,  sleep, home chores, work tasks    PARTICIPATION LIMITATIONS:  community, ADLs, activities    PERSONAL FACTORS       are also affecting patient's functional outcome.    REHAB POTENTIAL: Good   CLINICAL DECISION MAKING: Evolving/moderate complexity   EVALUATION COMPLEXITY: Moderate    PATIENT EDUCATION:    Education details: Showed pt anatomy images. Explained muscles attachments/ connection, physiology of deep core system/ spinal- thoracic-pelvis-lower kinetic chain as they relate to pt's presentation, Sx, and past Hx. Explained what and how  these areas of deficits need to be restored to balance and function  See Therapeutic activity / neuromuscular re-education section  Answered pt's questions.   Person educated: Patient Education method: Explanation, Demonstration, Tactile cues, Verbal cues, and Handouts Education comprehension: verbalized understanding, returned demonstration, verbal cues required, tactile cues required, and needs further education     PLAN: PT FREQUENCY: 1x/week   PT DURATION: 10 weeks   PLANNED INTERVENTIONS: Therapeutic exercises, Therapeutic activity, Neuromuscular re-education, Balance training, Gait training, Patient/Family education, Self Care, Joint mobilization, Spinal mobilization, Moist heat, Taping, and Manual therapy, dry needling.   PLAN FOR NEXT SESSION: progress RUE strengthening as able    GOALS: Goals reviewed with patient? Yes  SHORT TERM GOALS: Target date: 03/19/2023    Pt will demo IND with HEP                    Baseline: Not IND            Goal status: MET    LONG TERM GOALS: Target date: 07/02/2023    1.Pt will demo proper deep core coordination without chest breathing and optimal excursion of diaphragm/pelvic floor in order to promote spinal stability and pelvic floor function  Baseline: dyscoordination Goal status: INITIAL  2.  Pt will demo > 5 pt change on FOTO  to improve QOL and function  PFDI Urinary baseline - 29 Lower score = better function    PFDI Prolapse  29    Lower score = better function   Pelvic Pain baseline - 8 Lower score = better function    PFDI Bowel -33 Higher score = better function  Lumber baseline  - 50 Higher score = better function   Goal status: INITIAL  3.  Pt will demo proper body mechanics in against gravity tasks and ADLs  work tasks, fitness  to minimize straining pelvic floor / back    Baseline: not IND, improper form that places strain on pelvic floor  Goal status: Ongoing     4. Pt will demo increased  gait speed > 1.4 m/s with reciprocal gait pattern, longer stride length  in order to ambulate safely in community and return to fitness routine  Baseline: R shoulder. iliac crest lowered , 1.29 m/s, limited posterior rotation of R shoulder and pelvis , excessive sway to L  Goal status:MET ( 1.39 m/s)     5. Pt reports < 3/10 pain after standing to cook for > 10 min.   Baseline:  6/10 pain after standing to cook for > 10 min.   Goal status: MET ( 10/22: able to cook for > 15 min before needing to sit down)    6. Pt will regain shoulder flexion to > 135 deg and reach behind head is with digit III at T1 in order to fix her hair and to able to fold laundry with lateral movement to the R, reaching up to put away dishes, to do hair, turning steering wheel in car  Baseline: Shoulder 90 deg flexion R, L 135 deg  , hands behind head digit III at top of ear , pain with able to fold laundry with lateral movement to the R, reaching up to put away dishes, to do hair, turning steering wheel in car; 11/20: 96 deg R Goal status: Ongoing    7. Pt will report increase water intake from 16 fl oz to > 48 fl oz  and decrease bladder irritants ( wine from 10 oz per night to < 5 oz night) , tea 32 fl oz to < 16 fl  oz  Baseline:  Daily fluid intake:  16 fl oz water, 32 floz unsweeten tea, no soda, no coffee, 10 oz of wine per night   Goal status: MET ( 48 fl oz water, 16 fl oz tea)    8. Pt will follow with PCP for sleep study to rule in/ out OSA  Baseline: nocturia:  every hours between 11pm to 7am, husband tells her she snores, pt with Hx high BP, cholesterol   Goal status: Deferred  Pt received information about link between nocturia and OSA Pt nocturia decreased from every hour to 2-3 x / night.   9.  Patient will decrease Quick DASH score by > 8 points demonstrating reduced self-reported upper extremity disability. Baseline: 32 Goal status: NEW  10. Patient will increase R shoulder ER MMT strength by at  least 1/2 point to indicate improvement in functional strength for ADLs. Baseline: 3+/5 Goal status: NEW  Temple Pacini PT, DPT  [phone]

## 2023-06-10 ENCOUNTER — Other Ambulatory Visit (INDEPENDENT_AMBULATORY_CARE_PROVIDER_SITE_OTHER): Payer: PPO

## 2023-06-10 DIAGNOSIS — E038 Other specified hypothyroidism: Secondary | ICD-10-CM

## 2023-06-10 LAB — TSH: TSH: 2.53 u[IU]/mL (ref 0.35–5.50)

## 2023-06-11 ENCOUNTER — Other Ambulatory Visit: Payer: Self-pay

## 2023-06-11 MED ORDER — COMIRNATY 30 MCG/0.3ML IM SUSY
PREFILLED_SYRINGE | INTRAMUSCULAR | 0 refills | Status: DC
  Filled 2023-06-11: qty 0.3, 1d supply, fill #0

## 2023-06-12 ENCOUNTER — Ambulatory Visit: Payer: PPO

## 2023-06-12 DIAGNOSIS — M25511 Pain in right shoulder: Secondary | ICD-10-CM | POA: Diagnosis not present

## 2023-06-12 DIAGNOSIS — M6281 Muscle weakness (generalized): Secondary | ICD-10-CM

## 2023-06-12 DIAGNOSIS — G8929 Other chronic pain: Secondary | ICD-10-CM

## 2023-06-12 DIAGNOSIS — M25611 Stiffness of right shoulder, not elsewhere classified: Secondary | ICD-10-CM

## 2023-06-12 NOTE — Therapy (Signed)
OUTPATIENT PHYSICAL THERAPY SHOULDER TREATMENT   Patient Name: Mercedes [name] MRN: [id number] DOB:1945/07/04, 77 y.o., female Today's Date: 06/12/2023   PT End of Session - 06/12/23 1356     Visit Number 12    Number of Visits 17    Date for PT Re-Evaluation 07/02/23    PT Start Time 1401    PT Stop Time 1443    PT Time Calculation (min) 42 min    Activity Tolerance Patient tolerated treatment well;Patient limited by pain    Behavior During Therapy WFL for tasks assessed/performed              Past Medical History:  Diagnosis Date   Allergy    Cataract    bilateral-not an issue right now   Diverticulosis of colon    Fatty liver    Gout    HLD (hyperlipidemia)    under control   HTN (hypertension)    Hypothyroidism    Macular degeneration    Osteoarthritis    Rapid heartbeat    occasional, does not last long   Tobacco abuse    Past Surgical History:  Procedure Laterality Date   CATARACT EXTRACTION     CHOLECYSTECTOMY N/A 03/17/2014   Procedure: LAPAROSCOPIC CHOLECYSTECTOMY WITH INTRAOPERATIVE CHOLANGIOGRAM ;  Surgeon: Claud Kelp, MD;  Location: [facility];  Service: General;  Laterality: N/A;   COLONOSCOPY     removed polyps   lower back surgery      PARATHYROIDECTOMY Left 08/20/2022   Procedure: LEFT PARATHYROIDECTOMY;  Surgeon: Darnell Level, MD;  Location: [facility];  Service: General;  Laterality: Left;   SIGMOIDOSCOPY     Patient Active Problem List   Diagnosis Date Noted   COVID-19 virus infection 03/28/2023   Paresthesia 12/16/2022   Right shoulder pain 12/16/2022   Acute recurrent cystitis 07/04/2022   Spondylolisthesis of lumbar region 05/21/2022   Elevated hemoglobin (HCC) 11/07/2021   Swelling of right index finger 11/06/2021   Vaginal discharge 01/06/2021   Urinary frequency 01/06/2021   Hyperparathyroidism, primary (HCC) 12/11/2020   Health care maintenance 01/23/2018   Macular degeneration 01/23/2018   Low back pain 12/29/2015   Advance  care planning 12/14/2014   Vitamin D deficiency 12/14/2014   Impacted cerumen of left ear 01/29/2014   Hypercalcemia 12/01/2013   Hyperglycemia 10/02/2012   Medicare annual wellness visit, subsequent 10/02/2012   Leg pain 07/09/2012   Upper respiratory tract infection 11/03/2010   Gout    HLD (hyperlipidemia)    Hypothyroidism 01/10/2007   HYPERTENSION, BENIGN ESSENTIAL 01/10/2007   MENOPAUSAL SYNDROME 01/10/2007   Osteoarthritis 01/10/2007    PCP: Para March   REFERRING PROVIDER: Cordelia Pen   REFERRING DIAG: urinary frequency and urgency   Rationale for Evaluation and Treatment Rehabilitation  THERAPY DIAG:  Muscle weakness (generalized)  Stiffness of right shoulder, not elsewhere classified  Chronic right shoulder pain  ONSET DATE:   SUBJECTIVE:      SUBJECTIVE STATEMENT TODAY: Pt reports things have been going pretty good. She thinks her arm is doing better. She reports soreness but not pain.   PERTINENT HX:    Pt reports fall in January where she injured her R shoulder. In order to protect her back from recent back surgery she landed on R shoulder. She reports xrays did not show anything, PT was suggested as tx. Pt has had two cortisone injections in R shoulder, but says they didn't help much. Pt reports pain along R proximal and distal bicep with movement. Pt unable to  reach top of her head an behind her head with RUE. Pt reports she hasn't found anything that helps, not ice or heat. It has improved since January but  has not resolved. No burning, n/t sx.  Pt describes pain as sharp, fleeting. Continued movement doesn't help, just hurts. This is patient's dominant arm.   Per chart, imaging found the following 12/13/22: " FINDINGS: Mild AC joint and glenohumeral degenerative changes without significant loss of joint space. No fracture or dislocation. No other bony or soft tissue abnormalities identified.   IMPRESSION: Mild AC joint and glenohumeral degenerative changes.      Electronically Signed   By: Gerome Sam III M.D.   On: 12/15/2022 10:43"  SUBJECTIVE STATEMENT ON EVAL 02/19/23: 1) urinary frequency and urgency:   pt goes to the toilet once every 2 hours during day, every hour at night ( bedtime 11pm, wakes up 7am, drink wine 7pm, pt does not take any direutics) . Pt has to urinate again after leaving house 20 -30 min. Daily fluid intake:  16 fl oz water, 32 floz unsweeten tea, no soda, no coffee, 10 oz of wine per night.  SUI with sneezing   2) CLBP :  Hx of spinal fusion 2023. 6/10 pain after standing to cook for > 10 min.  Denied radiating pain   3) R shoulder pain:  since a fall in Jan 2024:  not able to fold laundry with lateral movement to the R, reaching up to put away dishes, to do hair, turning steering wheel in car  occurs with 4/10 . Denied radiating . Sleeps on both sides but no pillow between knees   PERTINENT HISTORY:    PAIN:  Are you having pain? Yes: see above   PRECAUTIONS: None  WEIGHT BEARING RESTRICTIONS: No  FALLS:  Has patient fallen in last 6 months? No  LIVING ENVIRONMENT: Lives with: lives with their spouse Lives in: house  Stairs: 3 STE with rail  Has following equipment at home: No  OCCUPATION: Programmer, systems , hobbies: reading, cooking, going out with friends, card games, sewing   PLOF: Independent  PATIENT GOALS:  Pain free in shoulder, resume lifestyles includes walking , going to gym, participating in social function    OBJECTIVE:    [facility] PT Assessment - 05/08/23 0844       Coordination   Coordination and Movement Description less upper trap overuse with RLE, less thoracic kyphosis, in supine: pt demo'd shoulder abduction 30 deg with ability to place RUE flat, less rounded shoulders and guarding tendency on plinth      Palpation   SI assessment  Levelled pelvic girdle, R shoulder higher             TODAYs TREATMENT: 06/12/23  On Plinth: RUE focus  Hot pack applied to R posterior shoulder  (pt reports nothing currently applied to skin that would prevent safe use of heat. No adverse reaction to heat observed or reported)  PVC AAROM shoulder flexion 1x12 slightly pain-limited PVC chest press 12x - pt rates medium, slightly painful with last 2 reps PVC AAROM  R shoulder ER 2x15 - fatiguing but no pain  PVC AAROM scaption -  limited to 6x due to pain Comments: discontinued interventions that were too pain-limited, modifications attempted  PT providing: PROM R shoulder ER and scaption x multiple reps within pain-free ranges. Repeated scaption eventually discontinued once pt reported increased pain felt anterior delt region. Pt also felt pain near tricep/TTP.  Provided brief TrP  release to these regions while performing PROM.  RUE: Supine shoulder ext isometric 10x with 3 sec hold/rep Supine shoulder ext isometric with ER 5x with 3 sec hold/rep  Supine shoulder ER 2x6 3 sec hold/rep  Supine YTB R bicep curl 2x10 YTB supine shoulder ER  3x10 YTB R tricep ext 15x   R shoulder isometrics at wall: 10x 3 sec hold/rep of flex/ext/er/ir  Table slides within pain-free range R shoulder flexion and abduction 8x for each with 10 sec hold/rep  Slow shoulder shrugs 10x bilat  R elbow flexed >90 deg with R shoulder flexion and abduction 10x of each  Pendulums: several reps cw/cc fwd/bckwd and ltl    HOME EXERCISE PROGRAM: Access Code: [email or web address] URL: [email or web address] Date: 06/05/2023 Prepared by: Temple Pacini  Exercises - Standing Isometric Shoulder External Rotation with Doorway  - 1 x daily - 7 x weekly - 2 sets - 10 reps - 3 sec hold/rep hold - Circular Shoulder Pendulum with Table Support  - 1 x daily - 7 x weekly - 2 sets - 10 reps - Standing Isometric Shoulder Internal Rotation at Doorway  - 1 x daily - 7 x weekly - 2 sets - 10 reps - 3 seconds hold - Isometric Shoulder Flexion at Wall  - 1 x daily - 7 x weekly - 2 sets - 10 reps - 3 seconds hold -  Isometric Shoulder Extension at Wall  - 1 x daily - 7 x weekly - 2 sets - 10 reps - 3 seconds hold   ASSESSMENT: Pt performs more interventions with use of YTB, and continues to tolerate band exercises without increased pain. Abduction and flexion still most pain-limited, although pt was able to complete modified versions with elbow flex >90 deg pain-free. The pt will benefit from further skilled therapy to address these deficits in order to improve functional mobility, QOL and reduce pain.  OBJECTIVE IMPAIRMENTS decreased activity tolerance, decreased coordination, decreased endurance, decreased mobility, difficulty walking, decreased ROM, decreased strength, decreased safety awareness, hypomobility, increased muscle spasms, impaired flexibility, improper body mechanics, postural dysfunction, and pain. scar restrictions   ACTIVITY LIMITATIONS  self-care,  sleep, home chores, work tasks    PARTICIPATION LIMITATIONS:  community, ADLs, activities    PERSONAL FACTORS       are also affecting patient's functional outcome.    REHAB POTENTIAL: Good   CLINICAL DECISION MAKING: Evolving/moderate complexity   EVALUATION COMPLEXITY: Moderate    PATIENT EDUCATION:    Education details: Showed pt anatomy images. Explained muscles attachments/ connection, physiology of deep core system/ spinal- thoracic-pelvis-lower kinetic chain as they relate to pt's presentation, Sx, and past Hx. Explained what and how these areas of deficits need to be restored to balance and function    See Therapeutic activity / neuromuscular re-education section  Answered pt's questions.   Person educated: Patient Education method: Explanation, Demonstration, Tactile cues, Verbal cues, and Handouts Education comprehension: verbalized understanding, returned demonstration, verbal cues required, tactile cues required, and needs further education     PLAN: PT FREQUENCY: 1x/week   PT DURATION: 10 weeks   PLANNED  INTERVENTIONS: Therapeutic exercises, Therapeutic activity, Neuromuscular re-education, Balance training, Gait training, Patient/Family education, Self Care, Joint mobilization, Spinal mobilization, Moist heat, Taping, and Manual therapy, dry needling.   PLAN FOR NEXT SESSION: progress RUE strengthening as able    GOALS: Goals reviewed with patient? Yes  SHORT TERM GOALS: Target date: 03/19/2023    Pt will demo IND with HEP  Baseline: Not IND            Goal status: MET    LONG TERM GOALS: Target date: 07/02/2023    1.Pt will demo proper deep core coordination without chest breathing and optimal excursion of diaphragm/pelvic floor in order to promote spinal stability and pelvic floor function  Baseline: dyscoordination Goal status: INITIAL  2.  Pt will demo > 5 pt change on FOTO  to improve QOL and function  PFDI Urinary baseline - 29 Lower score = better function    PFDI Prolapse  29    Lower score = better function   Pelvic Pain baseline - 8 Lower score = better function    PFDI Bowel -33 Higher score = better function  Lumber baseline  - 50 Higher score = better function   Goal status: INITIAL  3.  Pt will demo proper body mechanics in against gravity tasks and ADLs  work tasks, fitness  to minimize straining pelvic floor / back    Baseline: not IND, improper form that places strain on pelvic floor  Goal status: Ongoing     4. Pt will demo increased gait speed > 1.4 m/s with reciprocal gait pattern, longer stride length  in order to ambulate safely in community and return to fitness routine  Baseline: R shoulder. iliac crest lowered , 1.29 m/s, limited posterior rotation of R shoulder and pelvis , excessive sway to L  Goal status:MET ( 1.39 m/s)     5. Pt reports < 3/10 pain after standing to cook for > 10 min.   Baseline:  6/10 pain after standing to cook for > 10 min.   Goal status: MET ( 10/22: able to cook for > 15 min before  needing to sit down)    6. Pt will regain shoulder flexion to > 135 deg and reach behind head is with digit III at T1 in order to fix her hair and to able to fold laundry with lateral movement to the R, reaching up to put away dishes, to do hair, turning steering wheel in car  Baseline: Shoulder 90 deg flexion R, L 135 deg  , hands behind head digit III at top of ear , pain with able to fold laundry with lateral movement to the R, reaching up to put away dishes, to do hair, turning steering wheel in car; 11/20: 96 deg R Goal status: Ongoing    7. Pt will report increase water intake from 16 fl oz to > 48 fl oz  and decrease bladder irritants ( wine from 10 oz per night to < 5 oz night) , tea 32 fl oz to < 16 fl oz  Baseline:  Daily fluid intake:  16 fl oz water, 32 floz unsweeten tea, no soda, no coffee, 10 oz of wine per night   Goal status: MET ( 48 fl oz water, 16 fl oz tea)    8. Pt will follow with PCP for sleep study to rule in/ out OSA  Baseline: nocturia:  every hours between 11pm to 7am, husband tells her she snores, pt with Hx high BP, cholesterol   Goal status: Deferred  Pt received information about link between nocturia and OSA Pt nocturia decreased from every hour to 2-3 x / night.   9.  Patient will decrease Quick DASH score by > 8 points demonstrating reduced self-reported upper extremity disability. Baseline: 32 Goal status: NEW  10. Patient will increase R shoulder ER MMT strength by at  least 1/2 point to indicate improvement in functional strength for ADLs. Baseline: 3+/5 Goal status: NEW  Temple Pacini PT, DPT  [phone]

## 2023-06-19 ENCOUNTER — Ambulatory Visit: Payer: PPO

## 2023-07-01 ENCOUNTER — Ambulatory Visit: Payer: PPO

## 2023-07-01 DIAGNOSIS — G8929 Other chronic pain: Secondary | ICD-10-CM

## 2023-07-01 DIAGNOSIS — M25611 Stiffness of right shoulder, not elsewhere classified: Secondary | ICD-10-CM

## 2023-07-01 DIAGNOSIS — M25511 Pain in right shoulder: Secondary | ICD-10-CM | POA: Diagnosis not present

## 2023-07-01 DIAGNOSIS — M6281 Muscle weakness (generalized): Secondary | ICD-10-CM

## 2023-07-01 NOTE — Therapy (Signed)
OUTPATIENT PHYSICAL THERAPY SHOULDER TREATMENT/RECERT   Patient Name: Mercedes [name] MRN: [id number] DOB:1946/02/20, 77 y.o., female Today's Date: 07/01/2023   PT End of Session - 07/01/23 1610     Visit Number 13    Number of Visits 25    Date for PT Re-Evaluation 09/23/23    PT Start Time 1616    PT Stop Time 1658    PT Time Calculation (min) 42 min    Activity Tolerance Patient tolerated treatment well;Patient limited by pain    Behavior During Therapy WFL for tasks assessed/performed              Past Medical History:  Diagnosis Date   Allergy    Cataract    bilateral-not an issue right now   Diverticulosis of colon    Fatty liver    Gout    HLD (hyperlipidemia)    under control   HTN (hypertension)    Hypothyroidism    Macular degeneration    Osteoarthritis    Rapid heartbeat    occasional, does not last long   Tobacco abuse    Past Surgical History:  Procedure Laterality Date   CATARACT EXTRACTION     CHOLECYSTECTOMY N/A 03/17/2014   Procedure: LAPAROSCOPIC CHOLECYSTECTOMY WITH INTRAOPERATIVE CHOLANGIOGRAM ;  Surgeon: Claud Kelp, MD;  Location: [facility];  Service: General;  Laterality: N/A;   COLONOSCOPY     removed polyps   lower back surgery      PARATHYROIDECTOMY Left 08/20/2022   Procedure: LEFT PARATHYROIDECTOMY;  Surgeon: Darnell Level, MD;  Location: [facility];  Service: General;  Laterality: Left;   SIGMOIDOSCOPY     Patient Active Problem List   Diagnosis Date Noted   COVID-19 virus infection 03/28/2023   Paresthesia 12/16/2022   Right shoulder pain 12/16/2022   Acute recurrent cystitis 07/04/2022   Spondylolisthesis of lumbar region 05/21/2022   Elevated hemoglobin (HCC) 11/07/2021   Swelling of right index finger 11/06/2021   Vaginal discharge 01/06/2021   Urinary frequency 01/06/2021   Hyperparathyroidism, primary (HCC) 12/11/2020   Health care maintenance 01/23/2018   Macular degeneration 01/23/2018   Low back pain 12/29/2015    Advance care planning 12/14/2014   Vitamin D deficiency 12/14/2014   Impacted cerumen of left ear 01/29/2014   Hypercalcemia 12/01/2013   Hyperglycemia 10/02/2012   Medicare annual wellness visit, subsequent 10/02/2012   Leg pain 07/09/2012   Upper respiratory tract infection 11/03/2010   Gout    HLD (hyperlipidemia)    Hypothyroidism 01/10/2007   HYPERTENSION, BENIGN ESSENTIAL 01/10/2007   MENOPAUSAL SYNDROME 01/10/2007   Osteoarthritis 01/10/2007    PCP: Para March   REFERRING PROVIDER: Cordelia Pen   REFERRING DIAG: urinary frequency and urgency   Rationale for Evaluation and Treatment Rehabilitation  THERAPY DIAG:  Chronic right shoulder pain  Muscle weakness (generalized)  Stiffness of right shoulder, not elsewhere classified  ONSET DATE:   SUBJECTIVE:      SUBJECTIVE STATEMENT TODAY: Pt reports pain in shoulder has been good/very mild, she reports movement has been easier and RUE has been more flexible, with occasional  pain but this is fleeting. Pt ready to return to pelvic PT.   PERTINENT HX:    Pt reports fall in January where she injured her R shoulder. In order to protect her back from recent back surgery she landed on R shoulder. She reports xrays did not show anything, PT was suggested as tx. Pt has had two cortisone injections in R shoulder, but says they didn't help  much. Pt reports pain along R proximal and distal bicep with movement. Pt unable to reach top of her head an behind her head with RUE. Pt reports she hasn't found anything that helps, not ice or heat. It has improved since January but  has not resolved. No burning, n/t sx.  Pt describes pain as sharp, fleeting. Continued movement doesn't help, just hurts. This is patient's dominant arm.   Per chart, imaging found the following 12/13/22: " FINDINGS: Mild AC joint and glenohumeral degenerative changes without significant loss of joint space. No fracture or dislocation. No other bony or soft tissue  abnormalities identified.   IMPRESSION: Mild AC joint and glenohumeral degenerative changes.     Electronically Signed   By: Gerome Sam III M.D.   On: 12/15/2022 10:43"  SUBJECTIVE STATEMENT ON EVAL 02/19/23: 1) urinary frequency and urgency:   pt goes to the toilet once every 2 hours during day, every hour at night ( bedtime 11pm, wakes up 7am, drink wine 7pm, pt does not take any direutics) . Pt has to urinate again after leaving house 20 -30 min. Daily fluid intake:  16 fl oz water, 32 floz unsweeten tea, no soda, no coffee, 10 oz of wine per night.  SUI with sneezing   2) CLBP :  Hx of spinal fusion 2023. 6/10 pain after standing to cook for > 10 min.  Denied radiating pain   3) R shoulder pain:  since a fall in Jan 2024:  not able to fold laundry with lateral movement to the R, reaching up to put away dishes, to do hair, turning steering wheel in car  occurs with 4/10 . Denied radiating . Sleeps on both sides but no pillow between knees   PERTINENT HISTORY:    PAIN:  Are you having pain? Yes: see above   PRECAUTIONS: None  WEIGHT BEARING RESTRICTIONS: No  FALLS:  Has patient fallen in last 6 months? No  LIVING ENVIRONMENT: Lives with: lives with their spouse Lives in: house  Stairs: 3 STE with rail  Has following equipment at home: No  OCCUPATION: Programmer, systems , hobbies: reading, cooking, going out with friends, card games, sewing   PLOF: Independent  PATIENT GOALS:  Pain free in shoulder, resume lifestyles includes walking , going to gym, participating in social function    OBJECTIVE:    [facility] PT Assessment - 05/08/23 0844       Coordination   Coordination and Movement Description less upper trap overuse with RLE, less thoracic kyphosis, in supine: pt demo'd shoulder abduction 30 deg with ability to place RUE flat, less rounded shoulders and guarding tendency on plinth      Palpation   SI assessment  Levelled pelvic girdle, R shoulder higher              TODAYs TREATMENT: 07/01/23   TA:  Goal reassessment completed for re-certification (for RUE goals), please refer to goal section and assessment below for details.   TE:  Wall ladder assisted flexion 3x10 each UE  Seated shoulder abduction with IR to reach to back of head in comfortable range 10x, 6x RUE - able to reach hair somewhat but still limited  Slow shoulder shrugs 2x10 bilat  Shoulder circles CW/CC 10x each direction  Scapular squeezes 10x bilat   R shoulder isometrics at wall: 2x10x 3 sec hold/rep of flex/ext/er/ir, abduction   RTB shoulder ER 2x10 - challenging for RUE  PT updated and issued HEP, reviewed in session with pt  completing sets and reps as prescribed to confirm correct technique and that pt is able to do these pain-free. Written instruction and verbal instruction to modify range of motion to perform each exercise comfortably. Pt verbalized understanding and returned correct demonstration: Access Code: [email or web address] URL: [email or web address] Date: 07/01/2023 Prepared by: Temple Pacini  Exercises - Standing Shoulder Flexion Full Range  - 1 x daily - 4-5 x weekly - 3 sets - 10 reps - Shoulder Flexion Wall Walk  - 1 x daily - 4-5 x weekly - 2 sets - 6-10 reps - Isometric Shoulder Internal Rotation in Abduction with Ball at Wall  - 1 x daily - 4-5 x weekly - 3 sets - 10 reps - 1-2 seconds/rep hold - Standing Shoulder Horizontal Abduction with Resistance  - 1 x daily - 4-5 x weekly - 3 sets - 10 reps - Standing Shoulder External Rotation with Resistance  - 1 x daily - 4-5 x weekly - 2 sets - 10 reps     HOME EXERCISE PROGRAM:  07/01/2023:  PT updated and issued HEP, reviewed in session with pt completing sets and reps as prescribed to confirm correct technique and that pt is able to do these pain-free. Written instruction and verbal instruction to modify range of motion to perform each exercise comfortably. Pt verbalized understanding and  returned correct demonstration: Access Code: [email or web address] URL: [email or web address] Date: 07/01/2023 Prepared by: Temple Pacini  Exercises - Standing Shoulder Flexion Full Range  - 1 x daily - 4-5 x weekly - 3 sets - 10 reps - Shoulder Flexion Wall Walk  - 1 x daily - 4-5 x weekly - 2 sets - 6-10 reps - Isometric Shoulder Internal Rotation in Abduction with Ball at Wall  - 1 x daily - 4-5 x weekly - 3 sets - 10 reps - 1-2 seconds/rep hold - Standing Shoulder Horizontal Abduction with Resistance  - 1 x daily - 4-5 x weekly - 3 sets - 10 reps - Standing Shoulder External Rotation with Resistance  - 1 x daily - 4-5 x weekly - 2 sets - 10 reps   ASSESSMENT: Goal reassessment completed for recert. Pt making gains AEB meeting MMT goal for RUE, and partially  meeting remaining UE goals:  ROM goal and Quick Dash goal. Pt also with reports of overall improvement in RUE function and pain levels. PT updated HEP for pt to continue beyond ortho PT to make further gains, recommended future follow-up should she continue to experience limitation/pain. Pt verbalized understanding for all and agreeable to plan to return to pelvic PT at this time. The pt will benefit from further skilled therapy to address these deficits in order to improve functional mobility, QOL and reduce pain.  OBJECTIVE IMPAIRMENTS decreased activity tolerance, decreased coordination, decreased endurance, decreased mobility, difficulty walking, decreased ROM, decreased strength, decreased safety awareness, hypomobility, increased muscle spasms, impaired flexibility, improper body mechanics, postural dysfunction, and pain. scar restrictions   ACTIVITY LIMITATIONS  self-care,  sleep, home chores, work tasks    PARTICIPATION LIMITATIONS:  community, ADLs, activities    PERSONAL FACTORS       are also affecting patient's functional outcome.    REHAB POTENTIAL: Good   CLINICAL DECISION MAKING: Evolving/moderate complexity    EVALUATION COMPLEXITY: Moderate    PATIENT EDUCATION:    Education details: HEP, plan, return to pelvic PT  Person educated: Patient Education method: Explanation, Demonstration, Tactile cues, Verbal cues, and Handouts Education comprehension: verbalized understanding, returned demonstration, verbal cues required, tactile cues required,  and needs further education     PLAN: PT FREQUENCY: 1x/week   PT DURATION: 12 weeks   PLANNED INTERVENTIONS: Therapeutic exercises, Therapeutic activity, Neuromuscular re-education, Balance training, Gait training, Patient/Family education, Self Care, Joint mobilization, Spinal mobilization, Moist heat, Taping, and Manual therapy, dry needling.   PLAN FOR NEXT SESSION: progress RUE strengthening as able    GOALS: Goals reviewed with patient? Yes  SHORT TERM GOALS: Target date: 03/19/2023    Pt will demo IND with HEP                    Baseline: Not IND            Goal status: MET    LONG TERM GOALS: Target date: 07/02/2023    1.Pt will demo proper deep core coordination without chest breathing and optimal excursion of diaphragm/pelvic floor in order to promote spinal stability and pelvic floor function  Baseline: dyscoordination Goal status: INITIAL  2.  Pt will demo > 5 pt change on FOTO  to improve QOL and function  PFDI Urinary baseline - 29 Lower score = better function    PFDI Prolapse  29    Lower score = better function   Pelvic Pain baseline - 8 Lower score = better function    PFDI Bowel -33 Higher score = better function  Lumber baseline  - 50 Higher score = better function   Goal status: INITIAL  3.  Pt will demo proper body mechanics in against gravity tasks and ADLs  work tasks, fitness  to minimize straining pelvic floor / back    Baseline: not IND, improper form that places strain on pelvic floor  Goal status: Ongoing     4. Pt will demo increased gait speed > 1.4 m/s with reciprocal gait pattern,  longer stride length  in order to ambulate safely in community and return to fitness routine  Baseline: R shoulder. iliac crest lowered , 1.29 m/s, limited posterior rotation of R shoulder and pelvis , excessive sway to L  Goal status:MET ( 1.39 m/s)     5. Pt reports < 3/10 pain after standing to cook for > 10 min.   Baseline:  6/10 pain after standing to cook for > 10 min.   Goal status: MET ( 10/22: able to cook for > 15 min before needing to sit down)    6. Pt will regain shoulder flexion to > 135 deg and reach behind head is with digit III at T1 in order to fix her hair and to able to fold laundry with lateral movement to the R, reaching up to put away dishes, to do hair, turning steering wheel in car  Baseline: Shoulder 90 deg flexion R, L 135 deg  , hands behind head digit III at top of ear , pain with able to fold laundry with lateral movement to the R, reaching up to put away dishes, to do hair, turning steering wheel in car; 11/20: 96 deg R; 12/30: pt reports she is able to do her hair some with RUE but still somewhat limited due to weakness, 130 deg flexion RUE Goal status: PARTIALLY MET    7. Pt will report increase water intake from 16 fl oz to > 48 fl oz  and decrease bladder irritants ( wine from 10 oz per night to < 5 oz night) , tea 32 fl oz to < 16 fl oz  Baseline:  Daily fluid intake:  16 fl oz water, 32  floz unsweeten tea, no soda, no coffee, 10 oz of wine per night   Goal status: MET ( 48 fl oz water, 16 fl oz tea)    8. Pt will follow with PCP for sleep study to rule in/ out OSA  Baseline: nocturia:  every hours between 11pm to 7am, husband tells her she snores, pt with Hx high BP, cholesterol   Goal status: Deferred  Pt received information about link between nocturia and OSA Pt nocturia decreased from every hour to 2-3 x / night.   9.  Patient will decrease Quick DASH score by > 8 points demonstrating reduced self-reported upper extremity disability. Baseline: 32;  12/30: 25 Goal status: PARTIALLY MET  10. Patient will increase R shoulder ER MMT strength by at least 1/2 point to indicate improvement in functional strength for ADLs. Baseline: 3+/5; 12/30: 4+/5 RUE Goal status: MET  Temple Pacini PT, DPT  [phone]

## 2023-07-11 ENCOUNTER — Ambulatory Visit: Payer: PPO | Attending: Obstetrics and Gynecology | Admitting: Physical Therapy

## 2023-07-11 DIAGNOSIS — R2689 Other abnormalities of gait and mobility: Secondary | ICD-10-CM | POA: Insufficient documentation

## 2023-07-11 DIAGNOSIS — M6281 Muscle weakness (generalized): Secondary | ICD-10-CM | POA: Insufficient documentation

## 2023-07-11 DIAGNOSIS — R278 Other lack of coordination: Secondary | ICD-10-CM | POA: Insufficient documentation

## 2023-07-11 DIAGNOSIS — M5459 Other low back pain: Secondary | ICD-10-CM | POA: Diagnosis not present

## 2023-07-11 NOTE — Patient Instructions (Addendum)
 Stretches : (Cuing provided for proper alignment)  Instructions start with Strap on R    Stretches for your legs: LAYING on Back Use upper arms and elbows for stability when pulling strap Opposite knee bent and foot firm in align with hip   Strap on [place]:   Hamstring _knee bends on L foot   10 reps  With knee pointing straight ( slightly to outside to minimize snapping sensation)      10 reps with knee pointing out towards armpit ( notice the stretch in the medial hamstring muscle)   Move strap under L thigh, move straps to the R hand ,    IT muscle  _scoot hips to L,  cross L  leg over L and straighten knee with strap on [place],   Bend knee back and forth 5x  __  Seated,  Hamstring stretch  , thighs in a V  Palms by hip on seat   10 reps

## 2023-07-11 NOTE — Therapy (Signed)
 OUTPATIENT PHYSICAL THERAPY  TREATMENT    Patient Name: Mercedes [name] MRN: [id number] DOB:22-Mar-1946, 78 y.o., female Today's Date: 07/11/2023   PT End of Session - 07/11/23 1422     Visit Number 14    Number of Visits 25    Date for PT Re-Evaluation 09/23/23    PT Start Time 1422    PT Stop Time 1500    PT Time Calculation (min) 38 min    Activity Tolerance Patient tolerated treatment well;Patient limited by pain    Behavior During Therapy WFL for tasks assessed/performed              Past Medical History:  Diagnosis Date   Allergy    Cataract    bilateral-not an issue right now   Diverticulosis of colon    Fatty liver    Gout    HLD (hyperlipidemia)    under control   HTN (hypertension)    Hypothyroidism    Macular degeneration    Osteoarthritis    Rapid heartbeat    occasional, does not last long   Tobacco abuse    Past Surgical History:  Procedure Laterality Date   CATARACT EXTRACTION     CHOLECYSTECTOMY N/A 03/17/2014   Procedure: LAPAROSCOPIC CHOLECYSTECTOMY WITH INTRAOPERATIVE CHOLANGIOGRAM ;  Surgeon: Elon Pacini, MD;  Location: [facility];  Service: General;  Laterality: N/A;   COLONOSCOPY     removed polyps   lower back surgery      PARATHYROIDECTOMY Left 08/20/2022   Procedure: LEFT PARATHYROIDECTOMY;  Surgeon: Eletha Boas, MD;  Location: [facility];  Service: General;  Laterality: Left;   SIGMOIDOSCOPY     Patient Active Problem List   Diagnosis Date Noted   COVID-19 virus infection 03/28/2023   Paresthesia 12/16/2022   Right shoulder pain 12/16/2022   Acute recurrent cystitis 07/04/2022   Spondylolisthesis of lumbar region 05/21/2022   Elevated hemoglobin (HCC) 11/07/2021   Swelling of right index finger 11/06/2021   Vaginal discharge 01/06/2021   Urinary frequency 01/06/2021   Hyperparathyroidism, primary (HCC) 12/11/2020   Health care maintenance 01/23/2018   Macular degeneration 01/23/2018   Low back pain 12/29/2015   Advance care  planning 12/14/2014   Vitamin D  deficiency 12/14/2014   Impacted cerumen of left ear 01/29/2014   Hypercalcemia 12/01/2013   Hyperglycemia 10/02/2012   Medicare annual wellness visit, subsequent 10/02/2012   Leg pain 07/09/2012   Upper respiratory tract infection 11/03/2010   Gout    HLD (hyperlipidemia)    Hypothyroidism 01/10/2007   HYPERTENSION, BENIGN ESSENTIAL 01/10/2007   MENOPAUSAL SYNDROME 01/10/2007   Osteoarthritis 01/10/2007    PCP: [facility]   REFERRING PROVIDER: Rhenda   REFERRING DIAG: urinary frequency and urgency   Rationale for Evaluation and Treatment Rehabilitation  THERAPY DIAG:  Chronic right shoulder pain  Muscle weakness (generalized)  Stiffness of right shoulder, not elsewhere classified  Other low back pain  Other lack of coordination  Other abnormalities of gait and mobility  ONSET DATE:   SUBJECTIVE:      SUBJECTIVE STATEMENT TODAY: Pt reports her R shoulder pain is manageable after completing PT for the shoulder with other PT and has regained some ROM but not fully. It is significantly better overall. Pt is still not able to lift objects like dishes into overhead cabinet or set of sheets to linen closet and reaching behind her back for her bra.   Pt still having urgency and frequency. Pt is still on medications . Pt had a UTI and  was treated with the medications. Pt has had recurrent UTIs.    SUBJECTIVE STATEMENT ON EVAL 02/19/23: 1) urinary frequency and urgency:   pt goes to the toilet once every 2 hours during day, every hour at night ( bedtime 11pm, wakes up 7am, drink wine 7pm, pt does not take any direutics) . Pt has to urinate again after leaving house 20 -30 min. Daily fluid intake:  16 fl oz water, 32 floz unsweeten tea, no soda, no coffee, 10 oz of wine per night.  SUI with sneezing   2) CLBP :  Hx of spinal fusion 2023. 6/10 pain after standing to cook for > 10 min.  Denied radiating pain   3) R shoulder pain:  since a fall in  Jan 2024:  not able to fold laundry with lateral movement to the R, reaching up to put away dishes, to do hair, turning steering wheel in car  occurs with 4/10 . Denied radiating . Sleeps on both sides but no pillow between knees   PERTINENT HISTORY:    PAIN:  Are you having pain? Yes: see above   PRECAUTIONS: None  WEIGHT BEARING RESTRICTIONS: No  FALLS:  Has patient fallen in last 6 months? No  LIVING ENVIRONMENT: Lives with: lives with their spouse Lives in: house  Stairs: 3 STE with rail  Has following equipment at home: No  OCCUPATION: programmer, systems , hobbies: reading, cooking, going out with friends, card games, sewing   PLOF: Independent  PATIENT GOALS:  Pain free in shoulder, resume lifestyles includes walking , going to gym, participating in social function    OBJECTIVE:    [facility] PT Assessment - 07/11/23 1431       PROM   Overall PROM Comments B hamstring: in supine:  163 deg L, 160 deg R,   hip ext 170 deg R trunk-thigh      Palpation   SI assessment  supine/ sitting/ standing: L shoulder, R pelvis lowered    Palpation comment R SIJ hypomobility, tenderness              [facility] Adult PT Treatment/Exercise - 07/11/23 1502       Neuro Re-ed    Neuro Re-ed Details  cued for use of strap / leather strap  to perform IT and hamstring      Manual Therapy   Manual therapy comments Rotational mob, STM/MWM at B SIJ to promote hip ext                HOME EXERCISE PROGRAM:  See pt isntructions      ASSESSMENT:     Pt returns to Pelvic PT after getting Tx for her R shoulder with another PT. Pt's shoulder ROM has improved by 60% based on her report.  It is significantly better overall. Pt is still not able to lift objects like dishes into overhead cabinet or set of sheets to linen closet and reaching behind her back for her bra.   Pelvic PT session today focused on increasing ROM of B SIJ to promote hip ext which is very limited and by increasing hip  ROM, pelvic floor mobility will improve to completely empty urine which in turn will help minimize recurrent UTI.   Cued for hamstring and IT band stretches. Pt demo'd IND post training. Plan to continue applying manual Tx to further gain mobility along LKC.    The pt will benefit from further skilled therapy to address these deficits in order to improve functional mobility,  QOL, and achieve urinary goals .   OBJECTIVE IMPAIRMENTS decreased activity tolerance, decreased coordination, decreased endurance, decreased mobility, difficulty walking, decreased ROM, decreased strength, decreased safety awareness, hypomobility, increased muscle spasms, impaired flexibility, improper body mechanics, postural dysfunction, and pain. scar restrictions   ACTIVITY LIMITATIONS  self-care,  sleep, home chores, work tasks    PARTICIPATION LIMITATIONS:  community, ADLs, activities    PERSONAL FACTORS       are also affecting patient's functional outcome.    REHAB POTENTIAL: Good   CLINICAL DECISION MAKING: Evolving/moderate complexity   EVALUATION COMPLEXITY: Moderate    PATIENT EDUCATION:    Education details: HEP  Person educated: Patient Education method: Explanation, Demonstration, Tactile cues, Verbal cues, and Handouts Education comprehension: verbalized understanding, returned demonstration, verbal cues required, tactile cues required, and needs further education     PLAN: PT FREQUENCY: 1x/week   PT DURATION: 12 weeks   PLANNED INTERVENTIONS: Therapeutic exercises, Therapeutic activity, Neuromuscular re-education, Balance training, Gait training, Patient/Family education, Self Care, Joint mobilization, Spinal mobilization, Moist heat, Taping, and Manual therapy, dry needling.    GOALS: Goals reviewed with patient? Yes  SHORT TERM GOALS: Target date: 03/19/2023    Pt will demo IND with HEP                    Baseline: Not IND            Goal status: MET    LONG TERM GOALS: Target  date: 09/23/2023     1.Pt will demo proper deep core coordination without chest breathing and optimal excursion of diaphragm/pelvic floor in order to promote spinal stability and pelvic floor function  Baseline: dyscoordination Goal status: INITIAL  2.  Pt will demo > 5 pt change on FOTO  to improve QOL and function  PFDI Urinary baseline - 29 Lower score = better function    PFDI Prolapse  29    Lower score = better function   Pelvic Pain baseline - 8 Lower score = better function    PFDI Bowel -33 Higher score = better function  Lumber baseline  - 50 Higher score = better function   Goal status: INITIAL  3.  Pt will demo proper body mechanics in against gravity tasks and ADLs  work tasks, fitness  to minimize straining pelvic floor / back    Baseline: not IND, improper form that places strain on pelvic floor  Goal status: Ongoing     4. Pt will demo increased gait speed > 1.4 m/s with reciprocal gait pattern, longer stride length  in order to ambulate safely in community and return to fitness routine  Baseline: R shoulder. iliac crest lowered , 1.29 m/s, limited posterior rotation of R shoulder and pelvis , excessive sway to L  Goal status:MET ( 1.39 m/s)     5. Pt reports < 3/10 pain after standing to cook for > 10 min.   Baseline:  6/10 pain after standing to cook for > 10 min.   Goal status: MET ( 10/22: able to cook for > 15 min before needing to sit down)    6. Pt will regain shoulder flexion to > 135 deg and reach behind head is with digit III at T1 in order to fix her hair and to able to fold laundry with lateral movement to the R, reaching up to put away dishes, to do hair, turning steering wheel in car  Baseline: Shoulder 90 deg flexion R, L 135 deg  ,  hands behind head digit III at top of ear , pain with able to fold laundry with lateral movement to the R, reaching up to put away dishes, to do hair, turning steering wheel in car; 11/20: 96 deg R; 12/30: pt  reports she is able to do her hair some with RUE but still somewhat limited due to weakness, 130 deg flexion RUE Goal status: PARTIALLY MET    7. Pt will report increase water intake from 16 fl oz to > 48 fl oz  and decrease bladder irritants ( wine from 10 oz per night to < 5 oz night) , tea 32 fl oz to < 16 fl oz  Baseline:  Daily fluid intake:  16 fl oz water, 32 floz unsweeten tea, no soda, no coffee, 10 oz of wine per night   Goal status: MET ( 48 fl oz water, 16 fl oz tea)    8. Pt will follow with PCP for sleep study to rule in/ out OSA  Baseline: nocturia:  every hours between 11pm to 7am, husband tells her she snores, pt with Hx high BP, cholesterol   Goal status: Deferred  Pt received information about link between nocturia and OSA Pt nocturia decreased from every hour to 2-3 x / night.   9.  Patient will decrease Quick DASH score by > 8 points demonstrating reduced self-reported upper extremity disability. Baseline: 32; 12/30: 25 Goal status: PARTIALLY MET  10. Patient will increase R shoulder ER MMT strength by at least 1/2 point to indicate improvement in functional strength for ADLs. Baseline: 3+/5; 12/30: 4+/5 RUE Goal status: MET    Nidia Laud, PT, DPT, E-RYT Pelvic Health Physical Therapist [email or web address] [phone]

## 2023-07-15 DIAGNOSIS — H353112 Nonexudative age-related macular degeneration, right eye, intermediate dry stage: Secondary | ICD-10-CM | POA: Diagnosis not present

## 2023-07-15 DIAGNOSIS — H353221 Exudative age-related macular degeneration, left eye, with active choroidal neovascularization: Secondary | ICD-10-CM | POA: Diagnosis not present

## 2023-07-15 DIAGNOSIS — Z961 Presence of intraocular lens: Secondary | ICD-10-CM | POA: Diagnosis not present

## 2023-07-16 ENCOUNTER — Encounter: Payer: Self-pay | Admitting: Family Medicine

## 2023-07-16 DIAGNOSIS — Z1231 Encounter for screening mammogram for malignant neoplasm of breast: Secondary | ICD-10-CM | POA: Diagnosis not present

## 2023-07-16 LAB — HM MAMMOGRAPHY

## 2023-07-24 ENCOUNTER — Ambulatory Visit: Payer: PPO | Admitting: Physical Therapy

## 2023-07-30 ENCOUNTER — Ambulatory Visit: Payer: PPO | Admitting: Physical Therapy

## 2023-07-30 DIAGNOSIS — R278 Other lack of coordination: Secondary | ICD-10-CM

## 2023-07-30 DIAGNOSIS — M6281 Muscle weakness (generalized): Secondary | ICD-10-CM

## 2023-07-30 DIAGNOSIS — R2689 Other abnormalities of gait and mobility: Secondary | ICD-10-CM

## 2023-07-30 DIAGNOSIS — M5459 Other low back pain: Secondary | ICD-10-CM

## 2023-07-30 NOTE — Therapy (Unsigned)
OUTPATIENT PHYSICAL THERAPY  TREATMENT    Patient Name: Mercedes [name] MRN: [id number] DOB:[birth year], 78 y.o., female Today's Date: 07/30/2023   PT End of Session - 07/30/23 1556     Visit Number 15    Number of Visits 25    Date for PT Re-Evaluation 09/23/23    PT Start Time 1553    PT Stop Time 1633    PT Time Calculation (min) 40 min    Activity Tolerance Patient tolerated treatment well;Patient limited by pain    Behavior During Therapy [facility] for tasks assessed/performed              Past Medical History:  Diagnosis Date   Allergy    Cataract    bilateral-not an issue right now   Diverticulosis of colon    Fatty liver    Gout    HLD (hyperlipidemia)    under control   HTN (hypertension)    Hypothyroidism    Macular degeneration    Osteoarthritis    Rapid heartbeat    occasional, does not last long   Tobacco abuse    Past Surgical History:  Procedure Laterality Date   CATARACT EXTRACTION     CHOLECYSTECTOMY N/A 03/17/2014   Procedure: LAPAROSCOPIC CHOLECYSTECTOMY WITH INTRAOPERATIVE CHOLANGIOGRAM ;  Surgeon: Claud Kelp, MD;  Location: [facility];  Service: General;  Laterality: N/A;   COLONOSCOPY     removed polyps   lower back surgery      PARATHYROIDECTOMY Left 08/20/2022   Procedure: LEFT PARATHYROIDECTOMY;  Surgeon: Darnell Level, MD;  Location: [facility];  Service: General;  Laterality: Left;   SIGMOIDOSCOPY     Patient Active Problem List   Diagnosis Date Noted   COVID-19 virus infection 03/28/2023   Paresthesia 12/16/2022   Right shoulder pain 12/16/2022   Acute recurrent cystitis 07/04/2022   Spondylolisthesis of lumbar region 05/21/2022   Elevated hemoglobin (HCC) 11/07/2021   Swelling of right index finger 11/06/2021   Vaginal discharge 01/06/2021   Urinary frequency 01/06/2021   Hyperparathyroidism, primary (HCC) 12/11/2020   Health care maintenance 01/23/2018   Macular degeneration 01/23/2018   Low back pain 12/29/2015   Advance care  planning 12/14/2014   Vitamin D deficiency 12/14/2014   Impacted cerumen of left ear 01/29/2014   Hypercalcemia 12/01/2013   Hyperglycemia 10/02/2012   Medicare annual wellness visit, subsequent 10/02/2012   Leg pain 07/09/2012   Upper respiratory tract infection 11/03/2010   Gout    HLD (hyperlipidemia)    Hypothyroidism 01/10/2007   HYPERTENSION, BENIGN ESSENTIAL 01/10/2007   MENOPAUSAL SYNDROME 01/10/2007   Osteoarthritis 01/10/2007    PCP: Para March   REFERRING PROVIDER: Cordelia Pen   REFERRING DIAG: urinary frequency and urgency   Rationale for Evaluation and Treatment Rehabilitation  THERAPY DIAG:  Muscle weakness (generalized)  Other lack of coordination  Other low back pain  Other abnormalities of gait and mobility  ONSET DATE:   SUBJECTIVE:      SUBJECTIVE STATEMENT TODAY: Pt reports she felt sore after last session but the soreness went away   SUBJECTIVE STATEMENT ON EVAL 02/19/23: 1) urinary frequency and urgency:   pt goes to the toilet once every 2 hours during day, every hour at night ( bedtime 11pm, wakes up 7am, drink wine 7pm, pt does not take any direutics) . Pt has to urinate again after leaving house 20 -30 min. Daily fluid intake:  16 fl oz water, 32 floz unsweeten tea, no soda, no coffee, 10 oz of wine per  night.  SUI with sneezing   2) CLBP :  Hx of spinal fusion 2023. 6/10 pain after standing to cook for > 10 min.  Denied radiating pain   3) R shoulder pain:  since a fall in Jan 2024:  not able to fold laundry with lateral movement to the R, reaching up to put away dishes, to do hair, turning steering wheel in car  occurs with 4/10 . Denied radiating . Sleeps on both sides but no pillow between knees   PERTINENT HISTORY:    PAIN:  Are you having pain? Yes: see above   PRECAUTIONS: None  WEIGHT BEARING RESTRICTIONS: No  FALLS:  Has patient fallen in last 6 months? No  LIVING ENVIRONMENT: Lives with: lives with their spouse Lives in: house   Stairs: 3 STE with rail  Has following equipment at home: No  OCCUPATION: Programmer, systems , hobbies: reading, cooking, going out with friends, card games, sewing   PLOF: Independent  PATIENT GOALS:  Pain free in shoulder, resume lifestyles includes walking , going to gym, participating in social function    OBJECTIVE:            HOME EXERCISE PROGRAM:  See pt isntructions      ASSESSMENT:    Pelvic PT session today focused on increasing ROM of B SIJ to promote hip ext which is very limited and by increasing hip ROM, pelvic floor mobility will improve to completely empty urine which in turn will help minimize recurrent UTI.   Cued for hamstring and IT band stretches. Pt demo'd IND post training. Plan to continue applying manual Tx to further gain mobility along LKC.    The pt will benefit from further skilled therapy to address these deficits in order to improve functional mobility, QOL, and achieve urinary goals .   OBJECTIVE IMPAIRMENTS decreased activity tolerance, decreased coordination, decreased endurance, decreased mobility, difficulty walking, decreased ROM, decreased strength, decreased safety awareness, hypomobility, increased muscle spasms, impaired flexibility, improper body mechanics, postural dysfunction, and pain. scar restrictions   ACTIVITY LIMITATIONS  self-care,  sleep, home chores, work tasks    PARTICIPATION LIMITATIONS:  community, ADLs, activities    PERSONAL FACTORS       are also affecting patient's functional outcome.    REHAB POTENTIAL: Good   CLINICAL DECISION MAKING: Evolving/moderate complexity   EVALUATION COMPLEXITY: Moderate    PATIENT EDUCATION:    Education details: HEP  Person educated: Patient Education method: Explanation, Demonstration, Tactile cues, Verbal cues, and Handouts Education comprehension: verbalized understanding, returned demonstration, verbal cues required, tactile cues required, and needs further education      PLAN: PT FREQUENCY: 1x/week   PT DURATION: 12 weeks   PLANNED INTERVENTIONS: Therapeutic exercises, Therapeutic activity, Neuromuscular re-education, Balance training, Gait training, Patient/Family education, Self Care, Joint mobilization, Spinal mobilization, Moist heat, Taping, and Manual therapy, dry needling.    GOALS: Goals reviewed with patient? Yes  SHORT TERM GOALS: Target date: 03/19/2023    Pt will demo IND with HEP                    Baseline: Not IND            Goal status: MET    LONG TERM GOALS: Target date: 09/23/2023     1.Pt will demo proper deep core coordination without chest breathing and optimal excursion of diaphragm/pelvic floor in order to promote spinal stability and pelvic floor function  Baseline: dyscoordination Goal status: INITIAL  2.  Pt  will demo > 5 pt change on FOTO  to improve QOL and function  PFDI Urinary baseline - 29 Lower score = better function    PFDI Prolapse  29    Lower score = better function   Pelvic Pain baseline - 8 Lower score = better function    PFDI Bowel -33 Higher score = better function  Lumber baseline  - 50 Higher score = better function   Goal status: INITIAL  3.  Pt will demo proper body mechanics in against gravity tasks and ADLs  work tasks, fitness  to minimize straining pelvic floor / back    Baseline: not IND, improper form that places strain on pelvic floor  Goal status: Ongoing     4. Pt will demo increased gait speed > 1.4 m/s with reciprocal gait pattern, longer stride length  in order to ambulate safely in community and return to fitness routine  Baseline: R shoulder. iliac crest lowered , 1.29 m/s, limited posterior rotation of R shoulder and pelvis , excessive sway to L  Goal status:MET ( 1.39 m/s)     5. Pt reports < 3/10 pain after standing to cook for > 10 min.   Baseline:  6/10 pain after standing to cook for > 10 min.   Goal status: MET ( 10/22: able to cook for > 15 min  before needing to sit down)    6. Pt will regain shoulder flexion to > 135 deg and reach behind head is with digit III at T1 in order to fix her hair and to able to fold laundry with lateral movement to the R, reaching up to put away dishes, to do hair, turning steering wheel in car  Baseline: Shoulder 90 deg flexion R, L 135 deg  , hands behind head digit III at top of ear , pain with able to fold laundry with lateral movement to the R, reaching up to put away dishes, to do hair, turning steering wheel in car; 11/20: 96 deg R; 12/30: pt reports she is able to do her hair some with RUE but still somewhat limited due to weakness, 130 deg flexion RUE Goal status: PARTIALLY MET    7. Pt will report increase water intake from 16 fl oz to > 48 fl oz  and decrease bladder irritants ( wine from 10 oz per night to < 5 oz night) , tea 32 fl oz to < 16 fl oz  Baseline:  Daily fluid intake:  16 fl oz water, 32 floz unsweeten tea, no soda, no coffee, 10 oz of wine per night   Goal status: MET ( 48 fl oz water, 16 fl oz tea)    8. Pt will follow with PCP for sleep study to rule in/ out OSA  Baseline: nocturia:  every hours between 11pm to 7am, husband tells her she snores, pt with Hx high BP, cholesterol   Goal status: Deferred  Pt received information about link between nocturia and OSA Pt nocturia decreased from every hour to 2-3 x / night.   9.  Patient will decrease Quick DASH score by > 8 points demonstrating reduced self-reported upper extremity disability. Baseline: 32; 12/30: 25 Goal status: PARTIALLY MET  10. Patient will increase R shoulder ER MMT strength by at least 1/2 point to indicate improvement in functional strength for ADLs. Baseline: 3+/5; 12/30: 4+/5 RUE Goal status: MET    Mariane Masters, PT, DPT, E-RYT Pelvic Health Physical Therapist [email or web address] [phone]

## 2023-07-30 NOTE — Patient Instructions (Addendum)
   3- foot tap  10 reps  Each side   Hold onto wall   Slightly bend of standing knee, and keep hips above foot   ballmound of opposite leg   taps to each direction and   back to spot under hips- notice equal pressure through both legs, and across ballmound and heels    ___   Feet slides :  wear manicure spreaders upside down    Points of contact at sitting bones  Four points of contact of foot,  Side knee back while keeping knee out along 2-3rd toe line   Heel up, ankle not twist out Lower heel while keeping knee out along 2-3rd toe line Four points of contact of foot, Slide foot back while keeping knee out along 2-3rd toe line   Repeated with other foot     __

## 2023-08-11 ENCOUNTER — Other Ambulatory Visit: Payer: Self-pay | Admitting: Family Medicine

## 2023-08-13 ENCOUNTER — Ambulatory Visit: Payer: PPO | Attending: Obstetrics and Gynecology | Admitting: Physical Therapy

## 2023-08-13 DIAGNOSIS — R278 Other lack of coordination: Secondary | ICD-10-CM | POA: Diagnosis not present

## 2023-08-13 DIAGNOSIS — G8929 Other chronic pain: Secondary | ICD-10-CM | POA: Diagnosis not present

## 2023-08-13 DIAGNOSIS — M25511 Pain in right shoulder: Secondary | ICD-10-CM | POA: Insufficient documentation

## 2023-08-13 DIAGNOSIS — R2689 Other abnormalities of gait and mobility: Secondary | ICD-10-CM | POA: Insufficient documentation

## 2023-08-13 DIAGNOSIS — M6281 Muscle weakness (generalized): Secondary | ICD-10-CM | POA: Insufficient documentation

## 2023-08-13 DIAGNOSIS — M5459 Other low back pain: Secondary | ICD-10-CM | POA: Diagnosis not present

## 2023-08-13 DIAGNOSIS — M533 Sacrococcygeal disorders, not elsewhere classified: Secondary | ICD-10-CM | POA: Diagnosis not present

## 2023-08-13 NOTE — Patient Instructions (Signed)
Feet care :  Self -feet massage   Handshake : fingers between toes, moving ballmounds/toes back and forth several times while other hand anchors at arch. Do the same at the hind/mid foot.  Heel to toes upward to a letter Big Letter T strokes to spread ballmounds and toes, several times, pinch between webs of toes  Run finger tips along top of foot between long bones "comb between the bones"    Wiggle toes and spread them out when relaxing  _  Wear manicure spreaders  while doing feet slides

## 2023-08-13 NOTE — Therapy (Signed)
 OUTPATIENT PHYSICAL THERAPY  TREATMENT    Patient Name: Mercedes [name] MRN: [id number] DOB:08-31-1945, 78 y.o., female Today's Date: 08/13/2023   PT End of Session - 08/13/23 1431     Visit Number 16    Number of Visits 25    Date for PT Re-Evaluation 09/23/23    PT Start Time 1333    PT Stop Time 1415    PT Time Calculation (min) 42 min    Activity Tolerance Patient tolerated treatment well;Patient limited by pain    Behavior During Therapy WFL for tasks assessed/performed              Past Medical History:  Diagnosis Date   Allergy    Cataract    bilateral-not an issue right now   Diverticulosis of colon    Fatty liver    Gout    HLD (hyperlipidemia)    under control   HTN (hypertension)    Hypothyroidism    Macular degeneration    Osteoarthritis    Rapid heartbeat    occasional, does not last long   Tobacco abuse    Past Surgical History:  Procedure Laterality Date   CATARACT EXTRACTION     CHOLECYSTECTOMY N/A 03/17/2014   Procedure: LAPAROSCOPIC CHOLECYSTECTOMY WITH INTRAOPERATIVE CHOLANGIOGRAM ;  Surgeon: Claud Kelp, MD;  Location: [facility];  Service: General;  Laterality: N/A;   COLONOSCOPY     removed polyps   lower back surgery      PARATHYROIDECTOMY Left 08/20/2022   Procedure: LEFT PARATHYROIDECTOMY;  Surgeon: Darnell Level, MD;  Location: [facility];  Service: General;  Laterality: Left;   SIGMOIDOSCOPY     Patient Active Problem List   Diagnosis Date Noted   COVID-19 virus infection 03/28/2023   Paresthesia 12/16/2022   Right shoulder pain 12/16/2022   Acute recurrent cystitis 07/04/2022   Spondylolisthesis of lumbar region 05/21/2022   Elevated hemoglobin (HCC) 11/07/2021   Swelling of right index finger 11/06/2021   Vaginal discharge 01/06/2021   Urinary frequency 01/06/2021   Hyperparathyroidism, primary (HCC) 12/11/2020   Health care maintenance 01/23/2018   Macular degeneration 01/23/2018   Low back pain 12/29/2015   Advance care  planning 12/14/2014   Vitamin D deficiency 12/14/2014   Impacted cerumen of left ear 01/29/2014   Hypercalcemia 12/01/2013   Hyperglycemia 10/02/2012   Medicare annual wellness visit, subsequent 10/02/2012   Leg pain 07/09/2012   Upper respiratory tract infection 11/03/2010   Gout    HLD (hyperlipidemia)    Hypothyroidism 01/10/2007   HYPERTENSION, BENIGN ESSENTIAL 01/10/2007   MENOPAUSAL SYNDROME 01/10/2007   Osteoarthritis 01/10/2007    PCP: Para March   REFERRING PROVIDER: Cordelia Pen   REFERRING DIAG: urinary frequency and urgency   Rationale for Evaluation and Treatment Rehabilitation  THERAPY DIAG:  Muscle weakness (generalized)  Other lack of coordination  Other low back pain  Other abnormalities of gait and mobility  ONSET DATE:   SUBJECTIVE:      SUBJECTIVE STATEMENT TODAY: Pt is making it to the bathroom with less urgency  SUBJECTIVE STATEMENT ON EVAL 02/19/23: 1) urinary frequency and urgency:   pt goes to the toilet once every 2 hours during day, every hour at night ( bedtime 11pm, wakes up 7am, drink wine 7pm, pt does not take any direutics) . Pt has to urinate again after leaving house 20 -30 min. Daily fluid intake:  16 fl oz water, 32 floz unsweeten tea, no soda, no coffee, 10 oz of wine per night.  SUI with  sneezing   2) CLBP :  Hx of spinal fusion 2023. 6/10 pain after standing to cook for > 10 min.  Denied radiating pain   3) R shoulder pain:  since a fall in Jan 2024:  not able to fold laundry with lateral movement to the R, reaching up to put away dishes, to do hair, turning steering wheel in car  occurs with 4/10 . Denied radiating . Sleeps on both sides but no pillow between knees   PERTINENT HISTORY:    PAIN:  Are you having pain? Yes: see above   PRECAUTIONS: None  WEIGHT BEARING RESTRICTIONS: No  FALLS:  Has patient fallen in last 6 months? No  LIVING ENVIRONMENT: Lives with: lives with their spouse Lives in: house  Stairs: 3 STE with rail   Has following equipment at home: No  OCCUPATION: Programmer, systems , hobbies: reading, cooking, going out with friends, card games, sewing   PLOF: Independent  PATIENT GOALS:  Pain free in shoulder, resume lifestyles includes walking , going to gym, participating in social function    OBJECTIVE:     [facility] PT Assessment - 08/13/23 1431       Palpation   Palpation comment tightness and hypomobile plantar fascia,  midfoot , tib-fib L ,      Ambulation/Gait   Gait Comments more mobility at hip, knee, ankle, toe push up              [facility] Adult PT Treatment/Exercise - 08/13/23 1431       Self-Care   Self-Care Other Self-Care Comments    Other Self-Care Comments  educated to use manicure spreaders instead of the toe spreader she bought      Neuro Re-ed    Neuro Re-ed Details  cued for more great toe propioception in HEP, self-massage of feet,      Manual Therapy   Manual therapy comments STM/ MWM, distraction , PA/AP mob Grade II-II at problem areas noted in assessment to promote plantar fascia mobility             HOME EXERCISE PROGRAM:  See pt isntructions      ASSESSMENT:                Pt is making it to the bathroom with less urgency since last session               B SIJ mobility has good carry over from past session and demo'd improved gait patterms.               Further applied manual Tx to   increase L feet mobility to minimize overactivity of pelvic floor. Today's session will continue to help maintain SIJ mobility and pelvic floor mobility.   Plan to continue applying manual Tx to further gain mobility along LKC.    The pt will benefit from further skilled therapy to address these deficits in order to improve functional mobility, QOL, and achieve urinary goals .     OBJECTIVE IMPAIRMENTS decreased activity tolerance, decreased coordination, decreased endurance, decreased mobility, difficulty walking, decreased ROM, decreased strength, decreased  safety awareness, hypomobility, increased muscle spasms, impaired flexibility, improper body mechanics, postural dysfunction, and pain. scar restrictions   ACTIVITY LIMITATIONS  self-care,  sleep, home chores, work tasks    PARTICIPATION LIMITATIONS:  community, ADLs, activities    PERSONAL FACTORS       are also affecting patient's functional outcome.    REHAB POTENTIAL: Good   CLINICAL DECISION  MAKING: Evolving/moderate complexity   EVALUATION COMPLEXITY: Moderate    PATIENT EDUCATION:    Education details: HEP  Person educated: Patient Education method: Explanation, Demonstration, Tactile cues, Verbal cues, and Handouts Education comprehension: verbalized understanding, returned demonstration, verbal cues required, tactile cues required, and needs further education     PLAN: PT FREQUENCY: 1x/week   PT DURATION: 12 weeks   PLANNED INTERVENTIONS: Therapeutic exercises, Therapeutic activity, Neuromuscular re-education, Balance training, Gait training, Patient/Family education, Self Care, Joint mobilization, Spinal mobilization, Moist heat, Taping, and Manual therapy, dry needling.    GOALS: Goals reviewed with patient? Yes  SHORT TERM GOALS: Target date: 03/19/2023    Pt will demo IND with HEP                    Baseline: Not IND            Goal status: MET    LONG TERM GOALS: Target date: 09/23/2023     1.Pt will demo proper deep core coordination without chest breathing and optimal excursion of diaphragm/pelvic floor in order to promote spinal stability and pelvic floor function  Baseline: dyscoordination Goal status: INITIAL  2.  Pt will demo > 5 pt change on FOTO  to improve QOL and function  PFDI Urinary baseline - 29 Lower score = better function    PFDI Prolapse  29    Lower score = better function   Pelvic Pain baseline - 8 Lower score = better function    PFDI Bowel -33 Higher score = better function  Lumber baseline  - 50 Higher score =  better function   Goal status: INITIAL  3.  Pt will demo proper body mechanics in against gravity tasks and ADLs  work tasks, fitness  to minimize straining pelvic floor / back    Baseline: not IND, improper form that places strain on pelvic floor  Goal status: Ongoing     4. Pt will demo increased gait speed > 1.4 m/s with reciprocal gait pattern, longer stride length  in order to ambulate safely in community and return to fitness routine  Baseline: R shoulder. iliac crest lowered , 1.29 m/s, limited posterior rotation of R shoulder and pelvis , excessive sway to L  Goal status:MET ( 1.39 m/s)     5. Pt reports < 3/10 pain after standing to cook for > 10 min.   Baseline:  6/10 pain after standing to cook for > 10 min.   Goal status: MET ( 10/22: able to cook for > 15 min before needing to sit down)    6. Pt will regain shoulder flexion to > 135 deg and reach behind head is with digit III at T1 in order to fix her hair and to able to fold laundry with lateral movement to the R, reaching up to put away dishes, to do hair, turning steering wheel in car  Baseline: Shoulder 90 deg flexion R, L 135 deg  , hands behind head digit III at top of ear , pain with able to fold laundry with lateral movement to the R, reaching up to put away dishes, to do hair, turning steering wheel in car; 11/20: 96 deg R; 12/30: pt reports she is able to do her hair some with RUE but still somewhat limited due to weakness, 130 deg flexion RUE Goal status: PARTIALLY MET    7. Pt will report increase water intake from 16 fl oz to > 48 fl oz  and decrease bladder  irritants ( wine from 10 oz per night to < 5 oz night) , tea 32 fl oz to < 16 fl oz  Baseline:  Daily fluid intake:  16 fl oz water, 32 floz unsweeten tea, no soda, no coffee, 10 oz of wine per night   Goal status: MET ( 48 fl oz water, 16 fl oz tea)    8. Pt will follow with PCP for sleep study to rule in/ out OSA  Baseline: nocturia:  every hours  between 11pm to 7am, husband tells her she snores, pt with Hx high BP, cholesterol   Goal status: Deferred  Pt received information about link between nocturia and OSA Pt nocturia decreased from every hour to 2-3 x / night.   9.  Patient will decrease Quick DASH score by > 8 points demonstrating reduced self-reported upper extremity disability. Baseline: 32; 12/30: 25 Goal status: PARTIALLY MET  10. Patient will increase R shoulder ER MMT strength by at least 1/2 point to indicate improvement in functional strength for ADLs. Baseline: 3+/5; 12/30: 4+/5 RUE Goal status: MET    Mariane Masters, PT, DPT, E-RYT Pelvic Health Physical Therapist [email or web address] [phone]

## 2023-08-27 ENCOUNTER — Ambulatory Visit: Payer: PPO | Admitting: Physical Therapy

## 2023-08-27 DIAGNOSIS — G8929 Other chronic pain: Secondary | ICD-10-CM

## 2023-08-27 DIAGNOSIS — M6281 Muscle weakness (generalized): Secondary | ICD-10-CM

## 2023-08-27 DIAGNOSIS — R2689 Other abnormalities of gait and mobility: Secondary | ICD-10-CM

## 2023-08-27 DIAGNOSIS — M533 Sacrococcygeal disorders, not elsewhere classified: Secondary | ICD-10-CM

## 2023-08-27 DIAGNOSIS — M5459 Other low back pain: Secondary | ICD-10-CM

## 2023-08-27 DIAGNOSIS — R278 Other lack of coordination: Secondary | ICD-10-CM

## 2023-08-27 NOTE — Therapy (Signed)
 OUTPATIENT PHYSICAL THERAPY  TREATMENT    Patient Name: Mercedes [name] MRN: [id number] DOB:[birth year], 78 y.o., female Today's Date: 08/27/2023   PT End of Session - 08/27/23 1337     Visit Number 17    Number of Visits 25    Date for PT Re-Evaluation 09/23/23    PT Start Time 1334    PT Stop Time 1415    PT Time Calculation (min) 41 min    Activity Tolerance Patient tolerated treatment well;Patient limited by pain    Behavior During Therapy [facility] for tasks assessed/performed              Past Medical History:  Diagnosis Date   Allergy    Cataract    bilateral-not an issue right now   Diverticulosis of colon    Fatty liver    Gout    HLD (hyperlipidemia)    under control   HTN (hypertension)    Hypothyroidism    Macular degeneration    Osteoarthritis    Rapid heartbeat    occasional, does not last long   Tobacco abuse    Past Surgical History:  Procedure Laterality Date   CATARACT EXTRACTION     CHOLECYSTECTOMY N/A 03/17/2014   Procedure: LAPAROSCOPIC CHOLECYSTECTOMY WITH INTRAOPERATIVE CHOLANGIOGRAM ;  Surgeon: Claud Kelp, MD;  Location: [facility];  Service: General;  Laterality: N/A;   COLONOSCOPY     removed polyps   lower back surgery      PARATHYROIDECTOMY Left 08/20/2022   Procedure: LEFT PARATHYROIDECTOMY;  Surgeon: Darnell Level, MD;  Location: [facility];  Service: General;  Laterality: Left;   SIGMOIDOSCOPY     Patient Active Problem List   Diagnosis Date Noted   COVID-19 virus infection 03/28/2023   Paresthesia 12/16/2022   Right shoulder pain 12/16/2022   Acute recurrent cystitis 07/04/2022   Spondylolisthesis of lumbar region 05/21/2022   Elevated hemoglobin (HCC) 11/07/2021   Swelling of right index finger 11/06/2021   Vaginal discharge 01/06/2021   Urinary frequency 01/06/2021   Hyperparathyroidism, primary (HCC) 12/11/2020   Health care maintenance 01/23/2018   Macular degeneration 01/23/2018   Low back pain 12/29/2015   Advance care  planning 12/14/2014   Vitamin D deficiency 12/14/2014   Impacted cerumen of left ear 01/29/2014   Hypercalcemia 12/01/2013   Hyperglycemia 10/02/2012   Medicare annual wellness visit, subsequent 10/02/2012   Leg pain 07/09/2012   Upper respiratory tract infection 11/03/2010   Gout    HLD (hyperlipidemia)    Hypothyroidism 01/10/2007   HYPERTENSION, BENIGN ESSENTIAL 01/10/2007   MENOPAUSAL SYNDROME 01/10/2007   Osteoarthritis 01/10/2007    PCP: Para March   REFERRING PROVIDER: Cordelia Pen   REFERRING DIAG: urinary frequency and urgency   Rationale for Evaluation and Treatment Rehabilitation  THERAPY DIAG:  Muscle weakness (generalized)  Other lack of coordination  Other low back pain  Other abnormalities of gait and mobility  Chronic right shoulder pain  Sacrococcygeal disorders, not elsewhere classified  ONSET DATE:   SUBJECTIVE:      SUBJECTIVE STATEMENT TODAY: Pt reports the L foot mobility and tingling  is better since last session. The R foot feels swollen and tingling. The tingling is mostly in the toes.    SUBJECTIVE STATEMENT ON EVAL 02/19/23: 1) urinary frequency and urgency:   pt goes to the toilet once every 2 hours during day, every hour at night ( bedtime 11pm, wakes up 7am, drink wine 7pm, pt does not take any direutics) . Pt has to urinate again  after leaving house 20 -30 min. Daily fluid intake:  16 fl oz water, 32 floz unsweeten tea, no soda, no coffee, 10 oz of wine per night.  SUI with sneezing   2) CLBP :  Hx of spinal fusion 2023. 6/10 pain after standing to cook for > 10 min.  Denied radiating pain   3) R shoulder pain:  since a fall in Jan 2024:  not able to fold laundry with lateral movement to the R, reaching up to put away dishes, to do hair, turning steering wheel in car  occurs with 4/10 . Denied radiating . Sleeps on both sides but no pillow between knees   PERTINENT HISTORY:    PAIN:  Are you having pain? Yes: see above   PRECAUTIONS:  None  WEIGHT BEARING RESTRICTIONS: No  FALLS:  Has patient fallen in last 6 months? No  LIVING ENVIRONMENT: Lives with: lives with their spouse Lives in: house  Stairs: 3 STE with rail  Has following equipment at home: No  OCCUPATION: Programmer, systems , hobbies: reading, cooking, going out with friends, card games, sewing   PLOF: Independent  PATIENT GOALS:  Pain free in shoulder, resume lifestyles includes walking , going to gym, participating in social function    OBJECTIVE:   [facility] PT Assessment - 08/27/23 1416       Palpation   Palpation comment tightness and hypomobile plantar fascia,  midfoot , tib-fib R ,             [facility] Adult PT Treatment/Exercise - 08/27/23 1415       Neuro Re-ed    Neuro Re-ed Details  cued for plantar arches and calves and hamstrings, toe ext by wall      Manual Therapy   Manual therapy comments STM/ MWM, distraction , PA/AP mob Grade II-II at problem areas noted in assessment to promote R knee ER/ DF/EV , toe abduction /ext               HOME EXERCISE PROGRAM:  See pt isntructions      ASSESSMENT:                Further applied manual Tx to increase R feet mobility to minimize overactivity of pelvic floor. Pt demo'd improved toe ext , abd, DF/EV on RLE post Tx.   Plan to continue applying manual Tx to further gain mobility along LKC.   Plan to add dep core training next session   The pt will benefit from further skilled therapy to address these deficits in order to improve functional mobility, QOL, and achieve urinary goals .     OBJECTIVE IMPAIRMENTS decreased activity tolerance, decreased coordination, decreased endurance, decreased mobility, difficulty walking, decreased ROM, decreased strength, decreased safety awareness, hypomobility, increased muscle spasms, impaired flexibility, improper body mechanics, postural dysfunction, and pain. scar restrictions   ACTIVITY LIMITATIONS  self-care,  sleep, home chores, work tasks     PARTICIPATION LIMITATIONS:  community, ADLs, activities    PERSONAL FACTORS       are also affecting patient's functional outcome.    REHAB POTENTIAL: Good   CLINICAL DECISION MAKING: Evolving/moderate complexity   EVALUATION COMPLEXITY: Moderate    PATIENT EDUCATION:    Education details: HEP  Person educated: Patient Education method: Explanation, Demonstration, Tactile cues, Verbal cues, and Handouts Education comprehension: verbalized understanding, returned demonstration, verbal cues required, tactile cues required, and needs further education     PLAN: PT FREQUENCY: 1x/week   PT DURATION: 12 weeks  PLANNED INTERVENTIONS: Therapeutic exercises, Therapeutic activity, Neuromuscular re-education, Balance training, Gait training, Patient/Family education, Self Care, Joint mobilization, Spinal mobilization, Moist heat, Taping, and Manual therapy, dry needling.    GOALS: Goals reviewed with patient? Yes  SHORT TERM GOALS: Target date: 03/19/2023    Pt will demo IND with HEP                    Baseline: Not IND            Goal status: MET    LONG TERM GOALS: Target date: 09/23/2023     1.Pt will demo proper deep core coordination without chest breathing and optimal excursion of diaphragm/pelvic floor in order to promote spinal stability and pelvic floor function  Baseline: dyscoordination Goal status: INITIAL  2.  Pt will demo > 5 pt change on FOTO  to improve QOL and function  PFDI Urinary baseline - 29 Lower score = better function    PFDI Prolapse  29    Lower score = better function   Pelvic Pain baseline - 8 Lower score = better function    PFDI Bowel -33 Higher score = better function  Lumber baseline  - 50 Higher score = better function   Goal status: INITIAL  3.  Pt will demo proper body mechanics in against gravity tasks and ADLs  work tasks, fitness  to minimize straining pelvic floor / back    Baseline: not IND, improper form that  places strain on pelvic floor  Goal status: Ongoing     4. Pt will demo increased gait speed > 1.4 m/s with reciprocal gait pattern, longer stride length  in order to ambulate safely in community and return to fitness routine  Baseline: R shoulder. iliac crest lowered , 1.29 m/s, limited posterior rotation of R shoulder and pelvis , excessive sway to L  Goal status:MET ( 1.39 m/s)     5. Pt reports < 3/10 pain after standing to cook for > 10 min.   Baseline:  6/10 pain after standing to cook for > 10 min.   Goal status: MET ( 10/22: able to cook for > 15 min before needing to sit down)    6. Pt will regain shoulder flexion to > 135 deg and reach behind head is with digit III at T1 in order to fix her hair and to able to fold laundry with lateral movement to the R, reaching up to put away dishes, to do hair, turning steering wheel in car  Baseline: Shoulder 90 deg flexion R, L 135 deg  , hands behind head digit III at top of ear , pain with able to fold laundry with lateral movement to the R, reaching up to put away dishes, to do hair, turning steering wheel in car; 11/20: 96 deg R; 12/30: pt reports she is able to do her hair some with RUE but still somewhat limited due to weakness, 130 deg flexion RUE Goal status: PARTIALLY MET    7. Pt will report increase water intake from 16 fl oz to > 48 fl oz  and decrease bladder irritants ( wine from 10 oz per night to < 5 oz night) , tea 32 fl oz to < 16 fl oz  Baseline:  Daily fluid intake:  16 fl oz water, 32 floz unsweeten tea, no soda, no coffee, 10 oz of wine per night   Goal status: MET ( 48 fl oz water, 16 fl oz tea)  8. Pt will follow with PCP for sleep study to rule in/ out OSA  Baseline: nocturia:  every hours between 11pm to 7am, husband tells her she snores, pt with Hx high BP, cholesterol   Goal status: Deferred  Pt received information about link between nocturia and OSA Pt nocturia decreased from every hour to 2-3 x / night.    9.  Patient will decrease Quick DASH score by > 8 points demonstrating reduced self-reported upper extremity disability. Baseline: 32; 12/30: 25 Goal status: PARTIALLY MET  10. Patient will increase R shoulder ER MMT strength by at least 1/2 point to indicate improvement in functional strength for ADLs. Baseline: 3+/5; 12/30: 4+/5 RUE Goal status: MET    Mariane Masters, PT, DPT, E-RYT Pelvic Health Physical Therapist [email or web address] [phone]

## 2023-08-27 NOTE — Patient Instructions (Addendum)
 Toe ext stretch against the wall with back heel lift and knee bent for back leg   __

## 2023-08-28 ENCOUNTER — Encounter: Payer: Self-pay | Admitting: Family Medicine

## 2023-09-02 DIAGNOSIS — H353211 Exudative age-related macular degeneration, right eye, with active choroidal neovascularization: Secondary | ICD-10-CM | POA: Diagnosis not present

## 2023-09-02 DIAGNOSIS — Z961 Presence of intraocular lens: Secondary | ICD-10-CM | POA: Diagnosis not present

## 2023-09-02 DIAGNOSIS — H353221 Exudative age-related macular degeneration, left eye, with active choroidal neovascularization: Secondary | ICD-10-CM | POA: Diagnosis not present

## 2023-09-12 ENCOUNTER — Ambulatory Visit: Payer: PPO | Attending: Obstetrics and Gynecology | Admitting: Physical Therapy

## 2023-09-12 DIAGNOSIS — R2689 Other abnormalities of gait and mobility: Secondary | ICD-10-CM | POA: Diagnosis not present

## 2023-09-12 DIAGNOSIS — M25511 Pain in right shoulder: Secondary | ICD-10-CM | POA: Insufficient documentation

## 2023-09-12 DIAGNOSIS — M5459 Other low back pain: Secondary | ICD-10-CM | POA: Diagnosis not present

## 2023-09-12 DIAGNOSIS — M533 Sacrococcygeal disorders, not elsewhere classified: Secondary | ICD-10-CM | POA: Insufficient documentation

## 2023-09-12 DIAGNOSIS — G8929 Other chronic pain: Secondary | ICD-10-CM | POA: Diagnosis not present

## 2023-09-12 DIAGNOSIS — R278 Other lack of coordination: Secondary | ICD-10-CM | POA: Diagnosis not present

## 2023-09-12 DIAGNOSIS — M6281 Muscle weakness (generalized): Secondary | ICD-10-CM | POA: Diagnosis not present

## 2023-09-12 NOTE — Patient Instructions (Signed)
  Hip abductor and hip flexors stretch at the same time   Sit at 45 deg turn with R leg and knee on edge of chair/ bench, L buttock hanging off the edge to bring the L foot back like a lunge, toes bent, lower heel to feel quad stretch,  pay attention to keeping pinky and first toe ballmound planted to align toes forward so ankles are not twerked   Repeat with other side   ___   Seated hamstring, toes up  ___  Seated ribcage twist arm overhead like a rainbow , high five the sky  ___  Wall stretch for the foot and back leg stretching the calf    ___

## 2023-09-12 NOTE — Therapy (Signed)
 OUTPATIENT PHYSICAL THERAPY  TREATMENT  / Recert    Patient Name: Mercedes [name] MRN: [id number] DOB:12-May-1946, 78 y.o., female Today's Date: 09/12/2023   PT End of Session - 09/12/23 1512     Visit Number 18    Number of Visits 35    Date for PT Re-Evaluation 11/21/23    PT Start Time 1505    PT Stop Time 1545    PT Time Calculation (min) 40 min    Activity Tolerance Patient tolerated treatment well;Patient limited by pain    Behavior During Therapy WFL for tasks assessed/performed              Past Medical History:  Diagnosis Date   Allergy    Cataract    bilateral-not an issue right now   Diverticulosis of colon    Fatty liver    Gout    HLD (hyperlipidemia)    under control   HTN (hypertension)    Hypothyroidism    Macular degeneration    Osteoarthritis    Rapid heartbeat    occasional, does not last long   Tobacco abuse    Past Surgical History:  Procedure Laterality Date   CATARACT EXTRACTION     CHOLECYSTECTOMY N/A 03/17/2014   Procedure: LAPAROSCOPIC CHOLECYSTECTOMY WITH INTRAOPERATIVE CHOLANGIOGRAM ;  Surgeon: Claud Kelp, MD;  Location: [facility];  Service: General;  Laterality: N/A;   COLONOSCOPY     removed polyps   lower back surgery      PARATHYROIDECTOMY Left 08/20/2022   Procedure: LEFT PARATHYROIDECTOMY;  Surgeon: Darnell Level, MD;  Location: [facility];  Service: General;  Laterality: Left;   SIGMOIDOSCOPY     Patient Active Problem List   Diagnosis Date Noted   COVID-19 virus infection 03/28/2023   Paresthesia 12/16/2022   Right shoulder pain 12/16/2022   Acute recurrent cystitis 07/04/2022   Spondylolisthesis of lumbar region 05/21/2022   Elevated hemoglobin (HCC) 11/07/2021   Swelling of right index finger 11/06/2021   Vaginal discharge 01/06/2021   Urinary frequency 01/06/2021   Hyperparathyroidism, primary (HCC) 12/11/2020   Health care maintenance 01/23/2018   Macular degeneration 01/23/2018   Low back pain 12/29/2015    Advance care planning 12/14/2014   Vitamin D deficiency 12/14/2014   Impacted cerumen of left ear 01/29/2014   Hypercalcemia 12/01/2013   Hyperglycemia 10/02/2012   Medicare annual wellness visit, subsequent 10/02/2012   Leg pain 07/09/2012   Upper respiratory tract infection 11/03/2010   Gout    HLD (hyperlipidemia)    Hypothyroidism 01/10/2007   HYPERTENSION, BENIGN ESSENTIAL 01/10/2007   MENOPAUSAL SYNDROME 01/10/2007   Osteoarthritis 01/10/2007    PCP: Para March   REFERRING PROVIDER: Cordelia Pen   REFERRING DIAG: urinary frequency and urgency   Rationale for Evaluation and Treatment Rehabilitation  THERAPY DIAG:  Muscle weakness (generalized)  Other lack of coordination  Other low back pain  Other abnormalities of gait and mobility  ONSET DATE:   SUBJECTIVE:      SUBJECTIVE STATEMENT TODAY: Pt reports she has had mm spasms at the pubic area for the past few days. The mm spasms eases quickly.  Pt has had grainy and beige discharge in the urine across the two months.  There is foul odor in her urine when she has this discharge. It occurs infrequently.   Pt has been increasing her steps 10K every day for the past month.  Pt does not do any stretches    Pt has not been doing her deep core exercises  SUBJECTIVE STATEMENT ON EVAL 02/19/23: 1) urinary frequency and urgency:   pt goes to the toilet once every 2 hours during day, every hour at night ( bedtime 11pm, wakes up 7am, drink wine 7pm, pt does not take any direutics) . Pt has to urinate again after leaving house 20 -30 min. Daily fluid intake:  16 fl oz water, 32 floz unsweeten tea, no soda, no coffee, 10 oz of wine per night.  SUI with sneezing   2) CLBP :  Hx of spinal fusion 2023. 6/10 pain after standing to cook for > 10 min.  Denied radiating pain   3) R shoulder pain:  since a fall in Jan 2024:  not able to fold laundry with lateral movement to the R, reaching up to put away dishes, to do hair, turning steering  wheel in car  occurs with 4/10 . Denied radiating . Sleeps on both sides but no pillow between knees   PERTINENT HISTORY:    PAIN:  Are you having pain? Yes: see above   PRECAUTIONS: None  WEIGHT BEARING RESTRICTIONS: No  FALLS:  Has patient fallen in last 6 months? No  LIVING ENVIRONMENT: Lives with: lives with their spouse Lives in: house  Stairs: 3 STE with rail  Has following equipment at home: No  OCCUPATION: Programmer, systems , hobbies: reading, cooking, going out with friends, card games, sewing   PLOF: Independent  PATIENT GOALS:  Pain free in shoulder, resume lifestyles includes walking , going to gym, participating in social function    OBJECTIVE:    [facility] PT Assessment - 09/12/23 1724       AROM   Overall AROM Comments B hip ext limited ( R 0 deg)      Palpation   SI assessment  Levelled pelvic girdle, R shoulder higher    Palpation comment cramping in R leg/ hip with movements             [facility] Adult PT Treatment/Exercise - 09/12/23 1725       Therapeutic Activites    Other Therapeutic Activities reassessed goals      Neuro Re-ed    Neuro Re-ed Details  cued for stretches before and after walking  to minimzie mm spasms and promote more flexibility      Manual Therapy   Manual therapy comments STM/MWM at R leg to promote less cramps and hip ext               HOME EXERCISE PROGRAM:  See pt isntructions      ASSESSMENT:    Pt achieved 5/8 goals and progressing well towards goals for less LBP and shoulder pain. Currently focusing on deep core training and improving LKC mobility to help pt return to her walking routine.                Further applied manual Tx to increase R hip and LKC mobility to minimize cramps and B hip pain.    Plan to continue applying manual Tx to further  mobility along LKC.   Added deep core training today   The pt will benefit from further skilled therapy to address these deficits in order to improve functional  mobility, QOL, and achieve urinary goals .     OBJECTIVE IMPAIRMENTS decreased activity tolerance, decreased coordination, decreased endurance, decreased mobility, difficulty walking, decreased ROM, decreased strength, decreased safety awareness, hypomobility, increased muscle spasms, impaired flexibility, improper body mechanics, postural dysfunction, and pain. scar restrictions   ACTIVITY LIMITATIONS  self-care,  sleep, home chores, work tasks    PARTICIPATION LIMITATIONS:  community, ADLs, activities    PERSONAL FACTORS       are also affecting patient's functional outcome.    REHAB POTENTIAL: Good   CLINICAL DECISION MAKING: Evolving/moderate complexity   EVALUATION COMPLEXITY: Moderate    PATIENT EDUCATION:    Education details: HEP  Person educated: Patient Education method: Explanation, Demonstration, Tactile cues, Verbal cues, and Handouts Education comprehension: verbalized understanding, returned demonstration, verbal cues required, tactile cues required, and needs further education     PLAN: PT FREQUENCY: 1x/week   PT DURATION: 12 weeks   PLANNED INTERVENTIONS: Therapeutic exercises, Therapeutic activity, Neuromuscular re-education, Balance training, Gait training, Patient/Family education, Self Care, Joint mobilization, Spinal mobilization, Moist heat, Taping, and Manual therapy, dry needling.    GOALS: Goals reviewed with patient? Yes  SHORT TERM GOALS: Target date: 03/19/2023    Pt will demo IND with HEP                    Baseline: Not IND            Goal status: MET    LONG TERM GOALS: Target date: 09/23/2023     1.Pt will demo proper deep core coordination without chest breathing and optimal excursion of diaphragm/pelvic floor in order to promote spinal stability and pelvic floor function  Baseline: dyscoordination Goal status:  MET   2.  Pt will demo > 5 pt change on FOTO  to improve QOL and function  PFDI Urinary baseline - 29 Lower score =  better function    PFDI Prolapse  29    Lower score = better function   Pelvic Pain baseline - 8 Lower score = better function    PFDI Bowel -33 Higher score = better function  Lumber baseline  - 50 Higher score = better function   Goal status: DEFERRED   3.  Pt will demo proper body mechanics in against gravity tasks and ADLs  work tasks, fitness  to minimize straining pelvic floor / back    Baseline: not IND, improper form that places strain on pelvic floor  Goal status: Ongoing     4. Pt will demo increased gait speed > 1.4 m/s with reciprocal gait pattern, longer stride length  in order to ambulate safely in community and return to fitness routine  Baseline: R shoulder. iliac crest lowered , 1.29 m/s, limited posterior rotation of R shoulder and pelvis , excessive sway to L  Goal status:MET ( 1.39 m/s)     5. Pt reports < 3/10 pain after standing to cook for > 10 min.   Baseline:  6/10 pain after standing to cook for > 10 min.   Goal status: MET ( 10/22: able to cook for > 15 min before needing to sit down)    6. Pt will regain shoulder flexion to > 135 deg and reach behind head is with digit III at T1 in order to fix her hair and to able to fold laundry with lateral movement to the R, reaching up to put away dishes, to do hair, turning steering wheel in car  Baseline: Shoulder 90 deg flexion R, L 135 deg  , hands behind head digit III at top of ear , pain with able to fold laundry with lateral movement to the R, reaching up to put away dishes, to do hair, turning steering wheel in car; 11/20: 96 deg R; 12/30: pt reports she is  able to do her hair some with RUE but still somewhat limited due to weakness, 130 deg flexion RUE Goal status: PARTIALLY MET    7. Pt will report increase water intake from 16 fl oz to > 48 fl oz  and decrease bladder irritants ( wine from 10 oz per night to < 5 oz night) , tea 32 fl oz to < 16 fl oz  Baseline:  Daily fluid intake:  16 fl oz  water, 32 floz unsweeten tea, no soda, no coffee, 10 oz of wine per night   Goal status: MET ( 48 fl oz water, 16 fl oz tea)    8. Pt will follow with PCP for sleep study to rule in/ out OSA  Baseline: nocturia:  every hours between 11pm to 7am, husband tells her she snores, pt with Hx high BP, cholesterol   Goal status: Deferred  Pt received information about link between nocturia and OSA Pt nocturia decreased from every hour to 2-3 x / night.   9.  Patient will decrease Quick DASH score by > 8 points demonstrating reduced self-reported upper extremity disability. Baseline: 32; 12/30: 25 Goal status: PARTIALLY MET  10. Patient will increase R shoulder ER MMT strength by at least 1/2 point to indicate improvement in functional strength for ADLs. Baseline: 3+/5; 12/30: 4+/5 RUE Goal status: MET    Mariane Masters, PT, DPT, E-RYT Pelvic Health Physical Therapist [email or web address] [phone]

## 2023-09-24 ENCOUNTER — Ambulatory Visit: Payer: PPO | Admitting: Physical Therapy

## 2023-09-24 DIAGNOSIS — M5459 Other low back pain: Secondary | ICD-10-CM

## 2023-09-24 DIAGNOSIS — M533 Sacrococcygeal disorders, not elsewhere classified: Secondary | ICD-10-CM

## 2023-09-24 DIAGNOSIS — M6281 Muscle weakness (generalized): Secondary | ICD-10-CM

## 2023-09-24 DIAGNOSIS — G8929 Other chronic pain: Secondary | ICD-10-CM

## 2023-09-24 DIAGNOSIS — R2689 Other abnormalities of gait and mobility: Secondary | ICD-10-CM

## 2023-09-24 DIAGNOSIS — R278 Other lack of coordination: Secondary | ICD-10-CM

## 2023-09-24 NOTE — Therapy (Unsigned)
 OUTPATIENT PHYSICAL THERAPY  TREATMENT     Patient Name: Mercedes [name] MRN: [id number] DOB:31-Aug-1945, 78 y.o., female Today's Date: 09/24/2023   PT End of Session - 09/24/23 1508     Visit Number 19    Number of Visits 35    Date for PT Re-Evaluation 11/21/23    PT Start Time 1504    PT Stop Time 1545    PT Time Calculation (min) 41 min    Activity Tolerance Patient tolerated treatment well;Patient limited by pain    Behavior During Therapy WFL for tasks assessed/performed              Past Medical History:  Diagnosis Date   Allergy    Cataract    bilateral-not an issue right now   Diverticulosis of colon    Fatty liver    Gout    HLD (hyperlipidemia)    under control   HTN (hypertension)    Hypothyroidism    Macular degeneration    Osteoarthritis    Rapid heartbeat    occasional, does not last long   Tobacco abuse    Past Surgical History:  Procedure Laterality Date   CATARACT EXTRACTION     CHOLECYSTECTOMY N/A 03/17/2014   Procedure: LAPAROSCOPIC CHOLECYSTECTOMY WITH INTRAOPERATIVE CHOLANGIOGRAM ;  Surgeon: Claud Kelp, MD;  Location: [facility];  Service: General;  Laterality: N/A;   COLONOSCOPY     removed polyps   lower back surgery      PARATHYROIDECTOMY Left 08/20/2022   Procedure: LEFT PARATHYROIDECTOMY;  Surgeon: Darnell Level, MD;  Location: [facility];  Service: General;  Laterality: Left;   SIGMOIDOSCOPY     Patient Active Problem List   Diagnosis Date Noted   COVID-19 virus infection 03/28/2023   Paresthesia 12/16/2022   Right shoulder pain 12/16/2022   Acute recurrent cystitis 07/04/2022   Spondylolisthesis of lumbar region 05/21/2022   Elevated hemoglobin (HCC) 11/07/2021   Swelling of right index finger 11/06/2021   Vaginal discharge 01/06/2021   Urinary frequency 01/06/2021   Hyperparathyroidism, primary (HCC) 12/11/2020   Health care maintenance 01/23/2018   Macular degeneration 01/23/2018   Low back pain 12/29/2015   Advance care  planning 12/14/2014   Vitamin D deficiency 12/14/2014   Impacted cerumen of left ear 01/29/2014   Hypercalcemia 12/01/2013   Hyperglycemia 10/02/2012   Medicare annual wellness visit, subsequent 10/02/2012   Leg pain 07/09/2012   Upper respiratory tract infection 11/03/2010   Gout    HLD (hyperlipidemia)    Hypothyroidism 01/10/2007   HYPERTENSION, BENIGN ESSENTIAL 01/10/2007   MENOPAUSAL SYNDROME 01/10/2007   Osteoarthritis 01/10/2007    PCP: Para March   REFERRING PROVIDER: Cordelia Pen   REFERRING DIAG: urinary frequency and urgency   Rationale for Evaluation and Treatment Rehabilitation  THERAPY DIAG:  Muscle weakness (generalized)  Other lack of coordination  Other low back pain  Other abnormalities of gait and mobility  Chronic right shoulder pain  Sacrococcygeal disorders, not elsewhere classified  ONSET DATE:   SUBJECTIVE:      SUBJECTIVE STATEMENT TODAY:  Urinary issues are not better   Pt is still getting  grainy and beige discharge in the urine across the past two months. Pt used a compounded cream and it helped but the beige discharge returned. Pt will be seeing her urogynecologist 09/30/23   Pt had no pubic mm spasms lately.   Pt was tearful when she said she just found out that her husband has been Dx with PD    SUBJECTIVE  STATEMENT ON EVAL 02/19/23: 1) urinary frequency and urgency:   pt goes to the toilet once every 2 hours during day, every hour at night ( bedtime 11pm, wakes up 7am, drink wine 7pm, pt does not take any direutics) . Pt has to urinate again after leaving house 20 -30 min. Daily fluid intake:  16 fl oz water, 32 floz unsweeten tea, no soda, no coffee, 10 oz of wine per night.  SUI with sneezing   2) CLBP :  Hx of spinal fusion 2023. 6/10 pain after standing to cook for > 10 min.  Denied radiating pain   3) R shoulder pain:  since a fall in Jan 2024:  not able to fold laundry with lateral movement to the R, reaching up to put away dishes, to  do hair, turning steering wheel in car  occurs with 4/10 . Denied radiating . Sleeps on both sides but no pillow between knees   PERTINENT HISTORY:    PAIN:  Are you having pain? Yes: see above   PRECAUTIONS: None  WEIGHT BEARING RESTRICTIONS: No  FALLS:  Has patient fallen in last 6 months? No  LIVING ENVIRONMENT: Lives with: lives with their spouse Lives in: house  Stairs: 3 STE with rail  Has following equipment at home: No  OCCUPATION: Programmer, systems , hobbies: reading, cooking, going out with friends, card games, sewing   PLOF: Independent  PATIENT GOALS:  Pain free in shoulder, resume lifestyles includes walking , going to gym, participating in social function    OBJECTIVE:     [facility] PT Assessment - 09/25/23 0958       Coordination   Coordination and Movement Description posterior tilt in deep core, required cues for correciton for more anterior tile, demo'd overuse of  abs , required cues for less ab overuse      Palpation   Palpation comment tightness along C/T junction, medial scap B      Bed Mobility   Bed Mobility --   less cramping with bed mobility compared past sessions            [facility] Adult PT Treatment/Exercise - 09/25/23 0958       Self-Care   Other Self-Care Comments  active listening, provided empathy as pt expressed learning about her hushand's new Dx of PD      Neuro Re-ed    Neuro Re-ed Details  cued for deep core training , less ab overuse, more anterior tilt of pelvis      Manual Therapy   Manual therapy comments STM/MWM at areas noted in assessment, occiputal distraction ,  C/ T junction to promote mobility , optimal diaphragmatic excursion                HOME EXERCISE PROGRAM:  See pt isntructions      ASSESSMENT:    Pt has no pubic mm spasms and LBP has improved. R shoulder pain has improved with 4 sessions with Ortho PT after Pelvic PT was placed on hold because pt wanted to prioriotize shoulder pain before  addressing urinary leakage issues.    Pt 's posture has improved significantly with the past sessions' primary focus on improving her alignment of pelvis and spine and minimizing forward head, thoracic kyphosis which negatively impact IAP system  contributing to leakage.                 Reviewed deep core training today and cued for less ab overuse, more anterior tilt of pelvis .  Pt demo'd correct form post Tx. Anticipate deep core training with her improved posture will yield improved function of IAP system to improve leakage.   Plan to hold on internal pelvic assessment until pt has seen her MD regarding the grainy and beige discharge in her urine across the past two months. Pt used a compounded cream and it helped but the beige discharge returned. Pt will be seeing her urogynecologist 09/30/23   Plan to perform external assessment on pelvic floor in the mean time.       Pt will benefit from further skilled therapy to address these deficits in order to improve functional mobility, QOL, and achieve urinary goals .     OBJECTIVE IMPAIRMENTS decreased activity tolerance, decreased coordination, decreased endurance, decreased mobility, difficulty walking, decreased ROM, decreased strength, decreased safety awareness, hypomobility, increased muscle spasms, impaired flexibility, improper body mechanics, postural dysfunction, and pain. scar restrictions   ACTIVITY LIMITATIONS  self-care,  sleep, home chores, work tasks    PARTICIPATION LIMITATIONS:  community, ADLs, activities    PERSONAL FACTORS       are also affecting patient's functional outcome.    REHAB POTENTIAL: Good   CLINICAL DECISION MAKING: Evolving/moderate complexity   EVALUATION COMPLEXITY: Moderate    PATIENT EDUCATION:    Education details: HEP  Person educated: Patient Education method: Explanation, Demonstration, Tactile cues, Verbal cues, and Handouts Education comprehension: verbalized understanding, returned  demonstration, verbal cues required, tactile cues required, and needs further education     PLAN: PT FREQUENCY: 1x/week   PT DURATION: 12 weeks   PLANNED INTERVENTIONS: Therapeutic exercises, Therapeutic activity, Neuromuscular re-education, Balance training, Gait training, Patient/Family education, Self Care, Joint mobilization, Spinal mobilization, Moist heat, Taping, and Manual therapy, dry needling.    GOALS: Goals reviewed with patient? Yes  SHORT TERM GOALS: Target date: 03/19/2023    Pt will demo IND with HEP                    Baseline: Not IND            Goal status: MET    LONG TERM GOALS: Target date: 09/23/2023     1.Pt will demo proper deep core coordination without chest breathing and optimal excursion of diaphragm/pelvic floor in order to promote spinal stability and pelvic floor function  Baseline: dyscoordination Goal status:  MET   2.  Pt will demo > 5 pt change on FOTO  to improve QOL and function  PFDI Urinary baseline - 29 Lower score = better function    PFDI Prolapse  29    Lower score = better function   Pelvic Pain baseline - 8 Lower score = better function    PFDI Bowel -33 Higher score = better function  Lumber baseline  - 50 Higher score = better function   Goal status: DEFERRED   3.  Pt will demo proper body mechanics in against gravity tasks and ADLs  work tasks, fitness  to minimize straining pelvic floor / back    Baseline: not IND, improper form that places strain on pelvic floor  Goal status: Ongoing     4. Pt will demo increased gait speed > 1.4 m/s with reciprocal gait pattern, longer stride length  in order to ambulate safely in community and return to fitness routine  Baseline: R shoulder. iliac crest lowered , 1.29 m/s, limited posterior rotation of R shoulder and pelvis , excessive sway to L  Goal status:MET ( 1.39 m/s)  5. Pt reports < 3/10 pain after standing to cook for > 10 min.   Baseline:  6/10 pain  after standing to cook for > 10 min.   Goal status: MET ( 10/22: able to cook for > 15 min before needing to sit down)    6. Pt will regain shoulder flexion to > 135 deg and reach behind head is with digit III at T1 in order to fix her hair and to able to fold laundry with lateral movement to the R, reaching up to put away dishes, to do hair, turning steering wheel in car  Baseline: Shoulder 90 deg flexion R, L 135 deg  , hands behind head digit III at top of ear , pain with able to fold laundry with lateral movement to the R, reaching up to put away dishes, to do hair, turning steering wheel in car; 11/20: 96 deg R; 12/30: pt reports she is able to do her hair some with RUE but still somewhat limited due to weakness, 130 deg flexion RUE Goal status: PARTIALLY MET    7. Pt will report increase water intake from 16 fl oz to > 48 fl oz  and decrease bladder irritants ( wine from 10 oz per night to < 5 oz night) , tea 32 fl oz to < 16 fl oz  Baseline:  Daily fluid intake:  16 fl oz water, 32 floz unsweeten tea, no soda, no coffee, 10 oz of wine per night   Goal status: MET ( 48 fl oz water, 16 fl oz tea)    8. Pt will follow with PCP for sleep study to rule in/ out OSA  Baseline: nocturia:  every hours between 11pm to 7am, husband tells her she snores, pt with Hx high BP, cholesterol   Goal status: Deferred  Pt received information about link between nocturia and OSA Pt nocturia decreased from every hour to 2-3 x / night.   9.  Patient will decrease Quick DASH score by > 8 points demonstrating reduced self-reported upper extremity disability. Baseline: 32; 12/30: 25 Goal status: PARTIALLY MET  10. Patient will increase R shoulder ER MMT strength by at least 1/2 point to indicate improvement in functional strength for ADLs. Baseline: 3+/5; 12/30: 4+/5 RUE Goal status: MET    Mariane Masters, PT, DPT, E-RYT Pelvic Health Physical Therapist [email or web address] [phone]

## 2023-09-30 ENCOUNTER — Other Ambulatory Visit (HOSPITAL_COMMUNITY)
Admission: RE | Admit: 2023-09-30 | Discharge: 2023-09-30 | Disposition: A | Discharge status: Home / Self Care | Source: Ambulatory Visit | Attending: Obstetrics and Gynecology | Admitting: Obstetrics and Gynecology

## 2023-09-30 ENCOUNTER — Encounter: Payer: Self-pay | Admitting: Obstetrics and Gynecology

## 2023-09-30 ENCOUNTER — Ambulatory Visit (INDEPENDENT_AMBULATORY_CARE_PROVIDER_SITE_OTHER): Admitting: Obstetrics and Gynecology

## 2023-09-30 VITALS — BP 165/80 | HR 76

## 2023-09-30 DIAGNOSIS — R350 Frequency of micturition: Secondary | ICD-10-CM | POA: Diagnosis not present

## 2023-09-30 DIAGNOSIS — N898 Other specified noninflammatory disorders of vagina: Secondary | ICD-10-CM

## 2023-09-30 DIAGNOSIS — N3281 Overactive bladder: Secondary | ICD-10-CM | POA: Diagnosis not present

## 2023-09-30 DIAGNOSIS — Z87440 Personal history of urinary (tract) infections: Secondary | ICD-10-CM | POA: Diagnosis not present

## 2023-09-30 DIAGNOSIS — N390 Urinary tract infection, site not specified: Secondary | ICD-10-CM

## 2023-09-30 MED ORDER — CEPHALEXIN 500 MG PO CAPS: 500.0000 mg | ORAL_CAPSULE | Freq: Two times a day (BID) | ORAL | 0 refills | Status: DC

## 2023-09-30 MED ORDER — MIRABEGRON ER 50 MG PO TB24
50.0000 mg | ORAL_TABLET | Freq: Every day | ORAL | 5 refills | Status: DC
Start: 2023-09-30 — End: 2023-12-12

## 2023-09-30 MED ORDER — MIRABEGRON ER 25 MG PO TB24: 25.0000 mg | ORAL_TABLET | Freq: Every day | ORAL | 0 refills | Status: DC

## 2023-09-30 NOTE — Patient Instructions (Addendum)
 After 2 weeks of 25mg  Myrbetriq, then go to the 50mg .   Stop the compounded cream and use the coconut oil or the aloe vera with lidocaine and see if you like that better.   Please check your blood pressure at home.   If the Myrbetriq does not work we will plan for bladder botox.

## 2023-09-30 NOTE — Progress Notes (Signed)
 [facility] Return Visit  SUBJECTIVE  History of Present Illness: Mercedes [name] is a 78 y.o. female seen in follow-up for OAB and vaginal irritation. Plan at last visit was use compounded cream for vaginal irritation and PT for OAB.    Patient has been very active in PT, but they have not tet gotten to pelvic floor work. She is up every 2 hours to urinate like clockwork. She has tried Trospium and [place] without success.    Past Medical History: Patient  has a past medical history of Allergy, Cataract, Diverticulosis of colon, Fatty liver, Gout, HLD (hyperlipidemia), HTN (hypertension), Hypothyroidism, Macular degeneration, Osteoarthritis, Rapid heartbeat, and Tobacco abuse.   Past Surgical History: She  has a past surgical history that includes Sigmoidoscopy; Colonoscopy; Cholecystectomy (N/A, 03/17/2014); Cataract extraction; lower back surgery ; and Parathyroidectomy (Left, 08/20/2022).   Medications: She has a current medication list which includes the following prescription(s): ascorbic acid, cephalexin, cholecalciferol, colchicine, cranberry, vabysmo, levothyroxine, levothyroxine, metoprolol, mirabegron er, mirabegron er, multivitamin with minerals, preservision areds 2, NON FORMULARY, NONFORMULARY OR COMPOUNDED ITEM, omega 3, potassium, pravastatin, psyllium, pumpkin seed-soy germ, turmeric, and zinc gluconate.   Allergies: Patient is allergic to ace inhibitors, clarithromycin, fenofibrate, lipitor [atorvastatin], niacin, penicillins, sulfasalazine, tetracycline hcl, and macrobid [nitrofurantoin].   Social History: Patient  reports that she quit smoking about 13 years ago. Her smoking use included cigarettes. She started smoking about 28 years ago. She has a 3.8 pack-year smoking history. She has never used smokeless tobacco. She reports current alcohol use. She reports that she does not use drugs.     OBJECTIVE     Physical Exam: Vitals:   09/30/23 1447  BP:  (!) 160/82  Pulse: 92   Gen: No apparent distress, A&O x 3.  Detailed Urogynecologic Evaluation:  Internal vaginal skin tag noted on left vaginal side. No active bleeding. No mass or other concerning area. There is noted atrophy. There is a yellow discharge, will obtain aptima swab.    ASSESSMENT AND PLAN    Ms. Grealish is a 78 y.o. with:  1. Urinary frequency   2. OAB (overactive bladder)   3. Recurrent UTI   4. Vaginal discharge    Patient is going to the bathroom >12 times per day and is not getting rest. We have tried and failed Trospium and Myrbetriq as well as pumpkin seed extract. She is going to try Myrbetriq but if it causes increase in blood pressure or headaches she is aware to stop the medication.  Patient is considering botox as a third line therapy.  Patient has been off antibiotics for almost a month and is doing well. Is requesting to have kefelx on hand in case she gets a UTI while on her cruise.  Aptima swab obtained to rule out BV and yeast.   Patient to follow up in 4 weeks for medication follow up.   Selmer Dominion, NP

## 2023-10-01 ENCOUNTER — Ambulatory Visit: Admitting: Family Medicine

## 2023-10-01 ENCOUNTER — Ambulatory Visit: Payer: Self-pay

## 2023-10-01 ENCOUNTER — Encounter: Payer: Self-pay | Admitting: Family Medicine

## 2023-10-01 ENCOUNTER — Ambulatory Visit (INDEPENDENT_AMBULATORY_CARE_PROVIDER_SITE_OTHER): Admitting: Family Medicine

## 2023-10-01 VITALS — Ht 64.0 in

## 2023-10-01 VITALS — BP 162/82 | HR 83 | Temp 99.0°F | Ht 64.0 in | Wt 224.4 lb

## 2023-10-01 DIAGNOSIS — I10 Essential (primary) hypertension: Secondary | ICD-10-CM

## 2023-10-01 LAB — CERVICOVAGINAL ANCILLARY ONLY
Bacterial Vaginitis (gardnerella): NEGATIVE
Candida Glabrata: NEGATIVE
Candida Vaginitis: NEGATIVE
Comment: NEGATIVE
Comment: NEGATIVE
Comment: NEGATIVE

## 2023-10-01 MED ORDER — AMLODIPINE BESYLATE 5 MG PO TABS: 2.5000 mg | ORAL_TABLET | Freq: Every day | ORAL | 1 refills | Status: DC

## 2023-10-01 NOTE — Progress Notes (Unsigned)
 Plan to start myrbetriq but with concern about BP.  Discussed.  No CP, SOB, BLE edema.  Not lightheaded on standing.  No fevers, no chills.  Limiting salt at baseline.  Compliant with metoprolol.  BP has been elevated mult times.    Her husband potentially has Parkinson's.  Discussed.  Meds, vitals, and allergies reviewed.   ROS: Per HPI unless specifically indicated in ROS section   GEN: nad, alert and oriented HEENT: ncat NECK: supple w/o LA CV: rrr. PULM: ctab, no inc wob ABD: soft, +bs EXT: no edema SKIN: Well-perfused

## 2023-10-01 NOTE — Patient Instructions (Signed)
 Add on amlodipine 2.5mg  a day for about 5-7 days.  If BP is still >140/90 then increase to 5mg  a day.  Start myrbetriq when BP is controlled.  Take care.  Glad to see you. Update me as needed.

## 2023-10-01 NOTE — Telephone Encounter (Signed)
 Noted. Thanks.

## 2023-10-01 NOTE — Telephone Encounter (Signed)
  Chief Complaint: asymptomatic hypertension Symptoms: hypertension Frequency: x 2 days Pertinent Negatives: Patient denies chest pain, difficulty breathing, changes in vision or speech, unilateral numbness or weakness. Disposition: [] ED /[] Urgent Care (no appt availability in office) / [x] Appointment(In office/virtual)/ []  [facility]/ [] Home Care/ [] Refused Recommended Disposition /[] [facility]/ []  Follow-up with PCP Additional Notes: Patient states she was seen at her Urology GYN specialist yesterday and was told they would like to start her on Myrbetriq, however she states she is concerned it will elevate her BP. Patient states yesterday and today she has been having elevated readings, denies missing any doses of her metoprolol. Patient agreeable to acute visit, no availability with PCP within disposition time. Agreeable to acute visit today with Dr Ermalene Searing.  Copied from CRM [id number]. Topic: Clinical - Red Word Triage >> Oct 01, 2023  9:29 AM Sonny Dandy B wrote: [facility] that prompted transfer to Nurse Triage: pt called with high blood pressure 160/100 , 157-102 Reason for Disposition  Systolic BP  >= 180 OR Diastolic >= 110  Answer Assessment - Initial Assessment Questions 1. BLOOD PRESSURE: "What is the blood pressure?" "Did you take at least two measurements 5 minutes apart?"     160/82 (yesterday afternoon at the Uro/GYN), 158/90 (yesterday) 157/102 this morning before taking her metoprolol.  2. ONSET: "When did you take your blood pressure?"     Patient states she took her BP 3 times in the last 24 hours.  3. HOW: "How did you take your blood pressure?" (e.g., automatic home BP monitor, visiting nurse)     Manual reading at Uro/GYN and other readings are automatic home BP monitor.  4. HISTORY: "Do you have a history of high blood pressure?"     Yes.   5. MEDICINES: "Are you taking any medicines for blood pressure?" "Have you missed any doses recently?"      Metoprolol. Denies missing any doses recently.  6. OTHER SYMPTOMS: "Do you have any symptoms?" (e.g., blurred vision, chest pain, difficulty breathing, headache, weakness)     Denies.  7. PREGNANCY: "Is there any chance you are pregnant?" "When was your last menstrual period?"     N/A.  Protocols used: Blood Pressure - High-A-AH

## 2023-10-02 NOTE — Assessment & Plan Note (Signed)
 Add on amlodipine 2.5mg  a day for about 5-7 days.  If BP is still >140/90 then increase to 5mg  a day.  Start myrbetriq when BP is controlled.  Update me as needed.  She agrees with plan.

## 2023-10-03 NOTE — Progress Notes (Signed)
 Appointment canceled.

## 2023-10-07 ENCOUNTER — Ambulatory Visit: Payer: Self-pay | Admitting: Family Medicine

## 2023-10-07 ENCOUNTER — Ambulatory Visit (INDEPENDENT_AMBULATORY_CARE_PROVIDER_SITE_OTHER): Admitting: Family Medicine

## 2023-10-07 ENCOUNTER — Encounter: Payer: Self-pay | Admitting: Family Medicine

## 2023-10-07 VITALS — BP 132/70 | HR 77 | Temp 98.6°F | Ht 64.0 in | Wt 221.2 lb

## 2023-10-07 DIAGNOSIS — R062 Wheezing: Secondary | ICD-10-CM

## 2023-10-07 DIAGNOSIS — H353221 Exudative age-related macular degeneration, left eye, with active choroidal neovascularization: Secondary | ICD-10-CM | POA: Diagnosis not present

## 2023-10-07 DIAGNOSIS — H353211 Exudative age-related macular degeneration, right eye, with active choroidal neovascularization: Secondary | ICD-10-CM | POA: Diagnosis not present

## 2023-10-07 DIAGNOSIS — R051 Acute cough: Secondary | ICD-10-CM

## 2023-10-07 DIAGNOSIS — H353231 Exudative age-related macular degeneration, bilateral, with active choroidal neovascularization: Secondary | ICD-10-CM | POA: Diagnosis not present

## 2023-10-07 DIAGNOSIS — J208 Acute bronchitis due to other specified organisms: Secondary | ICD-10-CM

## 2023-10-07 LAB — POC INFLUENZA A&B (BINAX/QUICKVUE)
Influenza A, POC: NEGATIVE
Influenza B, POC: NEGATIVE

## 2023-10-07 LAB — POC COVID19 BINAXNOW: SARS Coronavirus 2 Ag: NEGATIVE

## 2023-10-07 MED ORDER — ALBUTEROL SULFATE HFA 108 (90 BASE) MCG/ACT IN AERS: 2.0000 | INHALATION_SPRAY | Freq: Four times a day (QID) | RESPIRATORY_TRACT | 0 refills | Status: DC | PRN

## 2023-10-07 MED ORDER — AZITHROMYCIN 250 MG PO TABS: ORAL_TABLET | ORAL | 0 refills | Status: AC

## 2023-10-07 NOTE — Telephone Encounter (Signed)
 FYI

## 2023-10-07 NOTE — Telephone Encounter (Signed)
 Copied from CRM [id number]. Topic: Clinical - Red Word Triage >> Oct 07, 2023  7:41 AM Mercedes [name] wrote: Red Word that prompted transfer to Nurse Triage: patient calling in has persistent cough, wheezing when laying down and sometimes when sitting up. Cough is getting worse. Patient is having eye injections today.   Chief Complaint: cough Symptoms: dry cough, wheezing Frequency: constant Pertinent Negatives: Patient denies fever Disposition: [] ED /[] Urgent Care (no appt availability in office) / [] Appointment(In office/virtual)/ []  [facility]/ [] Home Care/ [] Refused Recommended Disposition /[] [facility]/ []  Follow-up with PCP Additional Notes: Dry hacking cough x 1 week, getting worse, hearing some wheezing when laying down. Appt scheduled with Dr. Patsy Lager this morning  Reason for Disposition  Wheezing is present  Answer Assessment - Initial Assessment Questions 1. ONSET: "When did the cough begin?"      Started 1 week ago  2. SEVERITY: "How bad is the cough today?"      Getting worse 4 days ago  3. SPUTUM: "Describe the color of your sputum" (none, dry cough; clear, white, yellow, green)     Dry cough  4. HEMOPTYSIS: "Are you coughing up any blood?" If so ask: "How much?" (flecks, streaks, tablespoons, etc.)     No  5. DIFFICULTY BREATHING: "Are you having difficulty breathing?" If Yes, ask: "How bad is it?" (e.g., mild, moderate, severe)    - MILD: No SOB at rest, mild SOB with walking, speaks normally in sentences, can lie down, no retractions, pulse < 100.    - MODERATE: SOB at rest, SOB with minimal exertion and prefers to sit, cannot lie down flat, speaks in phrases, mild retractions, audible wheezing, pulse 100-120.    - SEVERE: Very SOB at rest, speaks in single words, struggling to breathe, sitting hunched forward, retractions, pulse > 120      Started wheezing over the weekend, only has trouble breathing when coughing  6. FEVER: "Do you have a fever?" If  Yes, ask: "What is your temperature, how was it measured, and when did it start?"     No  7. CARDIAC HISTORY: "Do you have any history of heart disease?" (e.g., heart attack, congestive heart failure)      No  8. LUNG HISTORY: "Do you have any history of lung disease?"  (e.g., pulmonary embolus, asthma, emphysema)     No  9. PE RISK FACTORS: "Do you have a history of blood clots?" (or: recent major surgery, recent prolonged travel, bedridden)     No  10. OTHER SYMPTOMS: "Do you have any other symptoms?" (e.g., runny nose, wheezing, chest pain)       Chest congestion  11. PREGNANCY: "Is there any chance you are pregnant?" "When was your last menstrual period?"       No  12. TRAVEL: "Have you traveled out of the country in the last month?" (e.g., travel history, exposures)       No  Protocols used: Cough - Acute Non-Productive-A-AH

## 2023-10-07 NOTE — Telephone Encounter (Signed)
 Noted. Thanks.

## 2023-10-07 NOTE — Progress Notes (Signed)
 Mercedes Stuckert T. Sharran Caratachea, MD, CAQ Sports Medicine [facility] [address] [place], [address]  Phone: [phone]  FAX: [phone]  Mercedes [name] - 78 y.o. female  MRN [id number]  Date of Birth: 06-09-1946  Date: 10/07/2023  PCP: Joaquim Nam, MD  Referral: Joaquim Nam, MD  Chief Complaint  Patient presents with   Cough    x couple of weeks with Chest Congestion Non-Productive   Wheezing   Subjective:   Mercedes [name] is a 78 y.o. very pleasant female patient with Body mass index is 37.98 kg/m. who presents with the following:  The patient presents with a dry cough that started approximately last Thursday.  She has a lot of chest congestion and some wheezing, but she does not really have a specific productive cough.  She has no known pulmonary disease including no asthma or history of pulmonary disease including no history of asthma or COPD.  She is not a smoker.  Was having some allergies a few weeks ago Thursday night   Wheezing    Review of Systems is noted in the HPI, as appropriate  Objective:   BP 132/70 (BP Location: Left Arm, Patient Position: Sitting, Cuff Size: Large)   Pulse 77   Temp 98.6 F (37 C) (Temporal)   Ht 5\' 4"  (1.626 m)   Wt 221 lb 4 oz (100.4 kg)   SpO2 97%   BMI 37.98 kg/m    Gen: WDWN, NAD. Globally Non-toxic HEENT: Throat clear, w/o exudate, R TM clear, L TM - good landmarks, No fluid present. rhinnorhea.  MMM Frontal sinuses: NT Max sinuses: NT NECK: Anterior cervical  LAD is absent CV: RRR, No M/G/R, cap refill <2 sec PULM: Breathing comfortably in no respiratory distress. no wheezing, crackles, rhonchi   Laboratory and Imaging Data:  Assessment and Plan:     ICD-10-CM   1. Acute bronchitis due to other specified organisms  J20.8     2. Acute cough  R05.1 POC COVID-19    POC Influenza A&B (Binax test)    3. Wheezing  R06.2      Acute bronchitis with wheezing and  no known pulmonary disease.  I am going to treat the patient with supportive care, Zithromax, she can use albuterol as needed if she is wheezing.  Medication Management during today's office visit: Meds ordered this encounter  Medications   azithromycin (ZITHROMAX) 250 MG tablet    Sig: Take 2 tablets (500 mg total) by mouth daily for 1 day, THEN 1 tablet (250 mg total) daily for 4 days.    Dispense:  6 tablet    Refill:  0   albuterol (VENTOLIN HFA) 108 (90 Base) MCG/ACT inhaler    Sig: Inhale 2 puffs into the lungs every 6 (six) hours as needed for wheezing or shortness of breath.    Dispense:  8 g    Refill:  0   Medications Discontinued During This Encounter  Medication Reason   cephALEXin (KEFLEX) 500 MG capsule Completed Course    Orders placed today for conditions managed today: Orders Placed This Encounter  Procedures   POC COVID-19   POC Influenza A&B (Binax test)    Disposition: No follow-ups on file.  [employer] One speech-to-text software was used for transcription in this dictation.  Possible transcriptional errors can occur using Animal nutritionist.   Signed,  Elpidio Galea. Cotey Rakes, MD   Outpatient Encounter Medications as of 10/07/2023  Medication Sig   albuterol (VENTOLIN HFA) 108 (90 Base) MCG/ACT inhaler Inhale 2 puffs into the lungs every 6 (six) hours as needed for wheezing or shortness of breath.   amLODipine (NORVASC) 5 MG tablet Take 0.5-1 tablets (2.5-5 mg total) by mouth daily.   ascorbic acid (VITAMIN C) 500 MG tablet Take 500 mg by mouth daily.   azithromycin (ZITHROMAX) 250 MG tablet Take 2 tablets (500 mg total) by mouth daily for 1 day, THEN 1 tablet (250 mg total) daily for 4 days.   cholecalciferol (VITAMIN D) 1000 units tablet Take 1,000 Units by mouth 2 (two) times a day.    colchicine 0.6 MG tablet Take 1 tablet by mouth twice daily as needed   CRANBERRY PO Take 1 capsule by mouth in the morning and at bedtime.   faricimab-svoa (VABYSMO) 6  MG/0.05ML SOLN intravitreal injection 0.05 mLs (6 mg total) by Intravitreal route as directed. (Patient taking differently: 6 mg by Intravitreal route as directed. Every 6 weeks)   levothyroxine (SYNTHROID) 200 MCG tablet TAKE 1 TABLET BY MOUTH ONCE DAILY BEFORE BREAKFAST   levothyroxine (SYNTHROID) 50 MCG tablet TAKE 1 TABLET BY MOUTH ONCE DAILY BEFORE  BREAKFAST ALONG WITH TABLET   metoprolol (TOPROL-XL) 200 MG 24 hr tablet TAKE 1 TABLET BY MOUTH IN THE MORNING   Multiple Vitamin (MULTIVITAMIN WITH MINERALS) TABS tablet Take 1 tablet by mouth daily.   Multiple Vitamins-Minerals (PRESERVISION AREDS 2) CAPS Take 1 capsule by mouth 2 (two) times daily.   NON FORMULARY D-MANOOSE   Omega 3 1000 MG CAPS Take 1,000 mg by mouth in the morning and at bedtime.   Potassium 99 MG TABS Take 99 mg by mouth in the morning and at bedtime.   pravastatin (PRAVACHOL) 40 MG tablet Take 1 tablet by mouth once daily   psyllium (REGULOID) 0.52 G capsule Take 1.04 g by mouth daily.   Pumpkin Seed-Soy Germ (AZO BLADDER CONTROL/GO-LESS PO) Take by mouth.   TURMERIC PO Take 750 mg by mouth daily.   zinc gluconate 50 MG tablet Take 50 mg by mouth daily.   mirabegron ER (MYRBETRIQ) 25 MG TB24 tablet Take 1 tablet (25 mg total) by mouth daily. (Patient not taking: Reported on 10/07/2023)   mirabegron ER (MYRBETRIQ) 50 MG TB24 tablet Take 1 tablet (50 mg total) by mouth daily. (Patient not taking: Reported on 10/07/2023)   [DISCONTINUED] cephALEXin (KEFLEX) 500 MG capsule Take 1 capsule (500 mg total) by mouth 2 (two) times daily. (Patient not taking: Reported on 10/01/2023)   No facility-administered encounter medications on file as of 10/07/2023.

## 2023-10-08 ENCOUNTER — Ambulatory Visit: Payer: PPO | Admitting: Physical Therapy

## 2023-10-13 ENCOUNTER — Telehealth: Payer: Self-pay | Admitting: Family Medicine

## 2023-10-13 NOTE — Telephone Encounter (Signed)
 Addendum.  Discussed with patient about her situation on 10/10/2023.  This was at her husband's appointment.  She had not used her albuterol inhaler yet.  She was still 98% on room air.  She still had a cough but her lungs were clear and her heart was regular.  She was in no distress.  She is going to try using albuterol and then update us  as needed.  Okay for outpatient follow-up.

## 2023-10-16 ENCOUNTER — Ambulatory Visit: Admitting: Physical Therapy

## 2023-10-17 ENCOUNTER — Other Ambulatory Visit: Payer: Self-pay | Admitting: Family Medicine

## 2023-10-22 ENCOUNTER — Other Ambulatory Visit: Payer: Self-pay | Admitting: Family Medicine

## 2023-10-22 DIAGNOSIS — M7989 Other specified soft tissue disorders: Secondary | ICD-10-CM

## 2023-10-24 ENCOUNTER — Ambulatory Visit: Attending: Obstetrics and Gynecology | Admitting: Physical Therapy

## 2023-10-24 DIAGNOSIS — M25511 Pain in right shoulder: Secondary | ICD-10-CM | POA: Diagnosis not present

## 2023-10-24 DIAGNOSIS — R278 Other lack of coordination: Secondary | ICD-10-CM | POA: Diagnosis not present

## 2023-10-24 DIAGNOSIS — M533 Sacrococcygeal disorders, not elsewhere classified: Secondary | ICD-10-CM | POA: Diagnosis not present

## 2023-10-24 DIAGNOSIS — G8929 Other chronic pain: Secondary | ICD-10-CM | POA: Diagnosis not present

## 2023-10-24 DIAGNOSIS — M5459 Other low back pain: Secondary | ICD-10-CM | POA: Diagnosis not present

## 2023-10-24 DIAGNOSIS — M6281 Muscle weakness (generalized): Secondary | ICD-10-CM | POA: Insufficient documentation

## 2023-10-24 DIAGNOSIS — R2689 Other abnormalities of gait and mobility: Secondary | ICD-10-CM | POA: Insufficient documentation

## 2023-10-24 NOTE — Therapy (Signed)
 OUTPATIENT PHYSICAL THERAPY  TREATMENT  / Progress Note across Visit 10- to 20 from 07/22/2022  to 10/24/23    Patient Name: Mercedes [name] MRN: [id number] DOB:[birth year], 78 y.o., female Today's Date: 10/24/2023   PT End of Session - 10/24/23 1427     Visit Number 20    Number of Visits 35    Date for PT Re-Evaluation 11/21/23    PT Start Time 1423    PT Stop Time 1503    PT Time Calculation (min) 40 min    Activity Tolerance Patient tolerated treatment well;Patient limited by pain    Behavior During Therapy [facility] for tasks assessed/performed              Past Medical History:  Diagnosis Date   Allergy    Cataract    bilateral-not an issue right now   Diverticulosis of colon    Fatty liver    Gout    HLD (hyperlipidemia)    under control   HTN (hypertension)    Hypothyroidism    Macular degeneration    Osteoarthritis    Rapid heartbeat    occasional, does not last long   Tobacco abuse    Past Surgical History:  Procedure Laterality Date   CATARACT EXTRACTION     CHOLECYSTECTOMY N/A 03/17/2014   Procedure: LAPAROSCOPIC CHOLECYSTECTOMY WITH INTRAOPERATIVE CHOLANGIOGRAM ;  Surgeon: Boyce Byes, MD;  Location: [facility];  Service: General;  Laterality: N/A;   COLONOSCOPY     removed polyps   lower back surgery      PARATHYROIDECTOMY Left 08/20/2022   Procedure: LEFT PARATHYROIDECTOMY;  Surgeon: Oralee Billow, MD;  Location: [facility];  Service: General;  Laterality: Left;   SIGMOIDOSCOPY     Patient Active Problem List   Diagnosis Date Noted   Paresthesia 12/16/2022   Right shoulder pain 12/16/2022   Spondylolisthesis of lumbar region 05/21/2022   Elevated hemoglobin (HCC) 11/07/2021   Hyperparathyroidism, primary (HCC) 12/11/2020   Health care maintenance 01/23/2018   Macular degeneration 01/23/2018   Low back pain 12/29/2015   Advance care planning 12/14/2014   Vitamin D  deficiency 12/14/2014   Hypercalcemia 12/01/2013   Hyperglycemia 10/02/2012   Medicare  annual wellness visit, subsequent 10/02/2012   Gout    HLD (hyperlipidemia)    Hypothyroidism 01/10/2007   HYPERTENSION, BENIGN ESSENTIAL 01/10/2007   MENOPAUSAL SYNDROME 01/10/2007   Osteoarthritis 01/10/2007    PCP: Vallarie Gauze   REFERRING PROVIDER: Gerre Kraft   REFERRING DIAG: urinary frequency and urgency   Rationale for Evaluation and Treatment Rehabilitation  THERAPY DIAG:  Muscle weakness (generalized)  Other low back pain  Other abnormalities of gait and mobility  Other lack of coordination  Chronic right shoulder pain  Sacrococcygeal disorders, not elsewhere classified  ONSET DATE:   SUBJECTIVE:      SUBJECTIVE STATEMENT TODAY:  Pt had bronchitis for the past 3 weeks. Pt had increased leakage but it is better now than she is not coughing as much Pt started OAB medication for the bladder    Pt did get a neg result back for the grainy and beige discharge in her urine.  Pt still notices this discharge occasionally.    SUBJECTIVE STATEMENT ON EVAL 02/19/23: 1) urinary frequency and urgency:   pt goes to the toilet once every 2 hours during day, every hour at night ( bedtime 11pm, wakes up 7am, drink wine 7pm, pt does not take any direutics) . Pt has to urinate again after leaving house 20 -30 min.  Daily fluid intake:  16 fl oz water, 32 floz unsweeten tea, no soda, no coffee, 10 oz of wine per night.  SUI with sneezing   2) CLBP :  Hx of spinal fusion 2023. 6/10 pain after standing to cook for > 10 min.  Denied radiating pain   3) R shoulder pain:  since a fall in Jan 2024:  not able to fold laundry with lateral movement to the R, reaching up to put away dishes, to do hair, turning steering wheel in car  occurs with 4/10 . Denied radiating . Sleeps on both sides but no pillow between knees   PERTINENT HISTORY:    PAIN:  Are you having pain? Yes: see above   PRECAUTIONS: None  WEIGHT BEARING RESTRICTIONS: No  FALLS:  Has patient fallen in last 6 months?  No  LIVING ENVIRONMENT: Lives with: lives with their spouse Lives in: house  Stairs: 3 STE with rail  Has following equipment at home: No  OCCUPATION: Programmer, systems , hobbies: reading, cooking, going out with friends, card games, sewing   PLOF: Independent  PATIENT GOALS:  Pain free in shoulder, resume lifestyles includes walking , going to gym, participating in social function    OBJECTIVE:     [facility] PT Assessment - 10/24/23 1432       Palpation   SI assessment  FADDIR and hip flexion B with more mobility    Palpation comment tightness along hamstring. medial and lateral knee,             Pelvic Floor Special Questions - 10/24/23 1455     External Perineal Exam throgh clothing tighter L anterior pelvic floor mm > R , along hamstring / adductor attachments, obt int L             [facility] Adult PT Treatment/Exercise - 10/24/23 1455       Therapeutic Activites    Other Therapeutic Activities guided relaxation with butterfly pose supported      Neuro Re-ed    Neuro Re-ed Details  cued for ER of knee and DF/EV of the foot to help minimize tightness of innter thigh and hamstrings mm related to pelvic floor ,      Manual Therapy   Manual therapy comments distraction at knee and STM/MWM at  problem areas noted in assessment                 HOME EXERCISE PROGRAM:  See pt isntructions      ASSESSMENT:    Pt has achieved 5/8 goals and progressing well towards remaining goals.    Pt has no pubic mm spasms and LBP has improved. R shoulder pain has improved with 4 sessions with Ortho PT after Pelvic PT was placed on hold because pt wanted to prioriotize shoulder pain before addressing urinary leakage issues.    Pt 's posture has improved significantly with the past sessions' primary focus on improving her alignment of pelvis and spine and minimizing forward head, thoracic kyphosis which negatively impact IAP system  contributing to leakage.                  Reviewed deep core training today and cued for less ab overuse, more anterior tilt of pelvis . Pt demo'd correct form post Tx. Anticipate deep core training with her improved posture will yield improved function of IAP system to improve leakage.    Performed  external assessment on pelvic floor through clothes which showed L anterior mm tightness >  R. After manual Tx Tx to [facility] , pt demo'd improved pelvic mobility and achieved 3 quick contractions before fatigue but still will hold off adding kegels into HEP until pt shows no more tightness.  Encouraged pt to increase her water intake further from 48 fl oz to 64 fl oz and explained it will help with mm cramps and bladder health. Pt voiced understanding.       Pt will benefit from further skilled therapy to address these deficits in order to improve functional mobility, QOL, and achieve urinary goals .     OBJECTIVE IMPAIRMENTS decreased activity tolerance, decreased coordination, decreased endurance, decreased mobility, difficulty walking, decreased ROM, decreased strength, decreased safety awareness, hypomobility, increased muscle spasms, impaired flexibility, improper body mechanics, postural dysfunction, and pain. scar restrictions   ACTIVITY LIMITATIONS  self-care,  sleep, home chores, work tasks    PARTICIPATION LIMITATIONS:  community, ADLs, activities    PERSONAL FACTORS       are also affecting patient's functional outcome.    REHAB POTENTIAL: Good   CLINICAL DECISION MAKING: Evolving/moderate complexity   EVALUATION COMPLEXITY: Moderate    PATIENT EDUCATION:    Education details: HEP  Person educated: Patient Education method: Explanation, Demonstration, Tactile cues, Verbal cues, and Handouts Education comprehension: verbalized understanding, returned demonstration, verbal cues required, tactile cues required, and needs further education     PLAN: PT FREQUENCY: 1x/week   PT DURATION: 12 weeks   PLANNED INTERVENTIONS:  Therapeutic exercises, Therapeutic activity, Neuromuscular re-education, Balance training, Gait training, Patient/Family education, Self Care, Joint mobilization, Spinal mobilization, Moist heat, Taping, and Manual therapy, dry needling.    GOALS: Goals reviewed with patient? Yes  SHORT TERM GOALS: Target date: 03/19/2023    Pt will demo IND with HEP                    Baseline: Not IND            Goal status: MET    LONG TERM GOALS: Target date: 11/21/2023       1.Pt will demo proper deep core coordination without chest breathing and optimal excursion of diaphragm/pelvic floor in order to promote spinal stability and pelvic floor function  Baseline: dyscoordination Goal status:  MET   2.  Pt will demo > 5 pt change on FOTO  to improve QOL and function  PFDI Urinary baseline - 29 Lower score = better function    PFDI Prolapse  29    Lower score = better function   Pelvic Pain baseline - 8 Lower score = better function    PFDI Bowel -33 Higher score = better function  Lumber baseline  - 50 Higher score = better function   Goal status: DEFERRED   3.  Pt will demo proper body mechanics in against gravity tasks and ADLs  work tasks, fitness  to minimize straining pelvic floor / back    Baseline: not IND, improper form that places strain on pelvic floor  Goal status: Ongoing     4. Pt will demo increased gait speed > 1.4 m/s with reciprocal gait pattern, longer stride length  in order to ambulate safely in community and return to fitness routine  Baseline: R shoulder. iliac crest lowered , 1.29 m/s, limited posterior rotation of R shoulder and pelvis , excessive sway to L  Goal status:MET ( 1.39 m/s)     5. Pt reports < 3/10 pain after standing to cook for > 10 min.   Baseline:  6/10 pain after standing to cook for > 10 min.   Goal status: MET ( 10/22: able to cook for > 15 min before needing to sit down)    6. Pt will regain shoulder flexion to > 135 deg  and reach behind head is with digit III at T1 in order to fix her hair and to able to fold laundry with lateral movement to the R, reaching up to put away dishes, to do hair, turning steering wheel in car  Baseline: Shoulder 90 deg flexion R, L 135 deg  , hands behind head digit III at top of ear , pain with able to fold laundry with lateral movement to the R, reaching up to put away dishes, to do hair, turning steering wheel in car; 11/20: 96 deg R; 12/30: pt reports she is able to do her hair some with RUE but still somewhat limited due to weakness, 130 deg flexion RUE Goal status: PARTIALLY MET    7. Pt will report increase water intake from 16 fl oz to > 48 fl oz  and decrease bladder irritants ( wine from 10 oz per night to < 5 oz night) , tea 32 fl oz to < 16 fl oz  Baseline:  Daily fluid intake:  16 fl oz water, 32 floz unsweeten tea, no soda, no coffee, 10 oz of wine per night   Goal status: MET ( 48 fl oz water, 16 fl oz tea)    8. Pt will follow with PCP for sleep study to rule in/ out OSA  Baseline: nocturia:  every hours between 11pm to 7am, husband tells her she snores, pt with Hx high BP, cholesterol   Goal status: Deferred  Pt received information about link between nocturia and OSA Pt nocturia decreased from every hour to 2-3 x / night.   9.  Patient will decrease Quick DASH score by > 8 points demonstrating reduced self-reported upper extremity disability. Baseline: 32; 12/30: 25 Goal status: PARTIALLY MET  10. Patient will increase R shoulder ER MMT strength by at least 1/2 point to indicate improvement in functional strength for ADLs. Baseline: 3+/5; 12/30: 4+/5 RUE Goal status: MET    Modesto Andreas, PT, DPT, E-RYT Pelvic Health Physical Therapist [email or web address] [phone]

## 2023-10-28 ENCOUNTER — Ambulatory Visit (INDEPENDENT_AMBULATORY_CARE_PROVIDER_SITE_OTHER): Admitting: Obstetrics and Gynecology

## 2023-10-28 ENCOUNTER — Encounter: Payer: Self-pay | Admitting: Obstetrics and Gynecology

## 2023-10-28 VITALS — BP 132/57 | HR 78

## 2023-10-28 DIAGNOSIS — N3281 Overactive bladder: Secondary | ICD-10-CM | POA: Diagnosis not present

## 2023-10-28 DIAGNOSIS — R159 Full incontinence of feces: Secondary | ICD-10-CM

## 2023-10-28 DIAGNOSIS — R350 Frequency of micturition: Secondary | ICD-10-CM | POA: Diagnosis not present

## 2023-10-28 NOTE — Progress Notes (Signed)
 [facility] Return Visit  SUBJECTIVE  History of Present Illness: Mercedes [name] is a 78 y.o. female seen in follow-up for OAB. Patient has tried and failed Myrbetriq , Gemtesa , and Trospium . She reports she has been having significant bladder spasms and has had x2 episodes of FI in the last 4 weeks.    Patient reports she also feels she is passing gas from the vagina. Denies stool in the vagina.   Patient also reports more numbness/tingling in her foot/ankle.   Past Medical History: Patient  has a past medical history of Allergy, Cataract, Diverticulosis of colon, Fatty liver, Gout, HLD (hyperlipidemia), HTN (hypertension), Hypothyroidism, Macular degeneration, Osteoarthritis, Rapid heartbeat, and Tobacco abuse.   Past Surgical History: She  has a past surgical history that includes Sigmoidoscopy; Colonoscopy; Cholecystectomy (N/A, 03/17/2014); Cataract extraction; lower back surgery ; and Parathyroidectomy (Left, 08/20/2022).   Medications: She has a current medication list which includes the following prescription(s): albuterol , amlodipine , ascorbic acid, cholecalciferol, colchicine , cranberry, vabysmo , levothyroxine , levothyroxine , metoprolol , mirabegron  er, multivitamin with minerals, preservision areds 2, NON FORMULARY, omega 3, potassium, pravastatin , psyllium, pumpkin seed-soy germ, turmeric, zinc  gluconate, and mirabegron  er.   Allergies: Patient is allergic to ace inhibitors, clarithromycin, fenofibrate, lipitor [atorvastatin ], niacin, penicillins, sulfasalazine, tetracycline hcl, and macrobid  [nitrofurantoin ].   Social History: Patient  reports that she quit smoking about 13 years ago. Her smoking use included cigarettes. She started smoking about 28 years ago. She has a 3.8 pack-year smoking history. She has never used smokeless tobacco. She reports current alcohol use. She reports that she does not use drugs.     OBJECTIVE     Physical Exam: Vitals:    10/28/23 0928  BP: (!) 132/57  Pulse: 78   Gen: No apparent distress, A&O x 3.  Detailed Urogynecologic Evaluation:  Vaginal exam reveals vaginal tissue with no mass, bleeding or stool. Rectal exam reveals stool burden in the rectum as well as some leakage of stool that was cleaned with a  cloth. Speculum exam and internal vaginal exam did not reveal vesicovaginal or rectovaginal fistula.    ASSESSMENT AND PLAN    [name] is a 78 y.o. with:  1. OAB (overactive bladder)   2. Urinary frequency   3. Incontinence of feces, unspecified fecal incontinence type    Patient has failed multiple medication options (Trospium , Gemtesa , and Myrbetriq ). Patient would like to move forward with a tertiary therapy. She has made lifestyle changes and done pelvic floor PT.  Patient is leaning more towards wanting to do bladder botox.  I spoke with Dr. Frutoso Jing and since she has not seen patient since 2023, she would like an in person visit to make sure they are on the same page for treatment options and direction of care. Patient to follow up with Dr. Frutoso Jing to discuss next steps of care regarding SNM or bladder botox.     Mercedes Zellars G Lusine Corlett, NP

## 2023-10-31 ENCOUNTER — Ambulatory Visit: Payer: PPO | Attending: Obstetrics and Gynecology | Admitting: Physical Therapy

## 2023-10-31 DIAGNOSIS — M6281 Muscle weakness (generalized): Secondary | ICD-10-CM | POA: Diagnosis not present

## 2023-10-31 DIAGNOSIS — M25611 Stiffness of right shoulder, not elsewhere classified: Secondary | ICD-10-CM | POA: Diagnosis not present

## 2023-10-31 DIAGNOSIS — G8929 Other chronic pain: Secondary | ICD-10-CM | POA: Insufficient documentation

## 2023-10-31 DIAGNOSIS — R278 Other lack of coordination: Secondary | ICD-10-CM | POA: Diagnosis not present

## 2023-10-31 DIAGNOSIS — M533 Sacrococcygeal disorders, not elsewhere classified: Secondary | ICD-10-CM | POA: Insufficient documentation

## 2023-10-31 DIAGNOSIS — R2689 Other abnormalities of gait and mobility: Secondary | ICD-10-CM | POA: Diagnosis not present

## 2023-10-31 DIAGNOSIS — M5459 Other low back pain: Secondary | ICD-10-CM | POA: Insufficient documentation

## 2023-10-31 DIAGNOSIS — M25511 Pain in right shoulder: Secondary | ICD-10-CM | POA: Insufficient documentation

## 2023-10-31 NOTE — Therapy (Signed)
 OUTPATIENT PHYSICAL THERAPY  TREATMENT     Patient Name: Mercedes [name] MRN: [id number] DOB:1945/07/05, 78 y.o., female Today's Date: 10/31/2023   PT End of Session - 10/31/23 1427     Visit Number 21    Number of Visits 35    Date for PT Re-Evaluation 11/21/23    PT Start Time 1420    PT Stop Time 1500    PT Time Calculation (min) 40 min    Activity Tolerance Patient tolerated treatment well;Patient limited by pain    Behavior During Therapy WFL for tasks assessed/performed              Past Medical History:  Diagnosis Date   Allergy    Cataract    bilateral-not an issue right now   Diverticulosis of colon    Fatty liver    Gout    HLD (hyperlipidemia)    under control   HTN (hypertension)    Hypothyroidism    Macular degeneration    Osteoarthritis    Rapid heartbeat    occasional, does not last long   Tobacco abuse    Past Surgical History:  Procedure Laterality Date   CATARACT EXTRACTION     CHOLECYSTECTOMY N/A 03/17/2014   Procedure: LAPAROSCOPIC CHOLECYSTECTOMY WITH INTRAOPERATIVE CHOLANGIOGRAM ;  Surgeon: Boyce Byes, MD;  Location: [facility];  Service: General;  Laterality: N/A;   COLONOSCOPY     removed polyps   lower back surgery      PARATHYROIDECTOMY Left 08/20/2022   Procedure: LEFT PARATHYROIDECTOMY;  Surgeon: Oralee Billow, MD;  Location: [facility];  Service: General;  Laterality: Left;   SIGMOIDOSCOPY     Patient Active Problem List   Diagnosis Date Noted   Paresthesia 12/16/2022   Right shoulder pain 12/16/2022   Spondylolisthesis of lumbar region 05/21/2022   Elevated hemoglobin (HCC) 11/07/2021   Hyperparathyroidism, primary (HCC) 12/11/2020   Health care maintenance 01/23/2018   Macular degeneration 01/23/2018   Low back pain 12/29/2015   Advance care planning 12/14/2014   Vitamin D  deficiency 12/14/2014   Hypercalcemia 12/01/2013   Hyperglycemia 10/02/2012   Medicare annual wellness visit, subsequent 10/02/2012   Gout    HLD  (hyperlipidemia)    Hypothyroidism 01/10/2007   HYPERTENSION, BENIGN ESSENTIAL 01/10/2007   MENOPAUSAL SYNDROME 01/10/2007   Osteoarthritis 01/10/2007    PCP: Vallarie Gauze   REFERRING PROVIDER: Gerre Kraft   REFERRING DIAG: urinary frequency and urgency   Rationale for Evaluation and Treatment Rehabilitation  THERAPY DIAG:  Muscle weakness (generalized)  Other low back pain  Other abnormalities of gait and mobility  Other lack of coordination  Chronic right shoulder pain  Sacrococcygeal disorders, not elsewhere classified  Stiffness of right shoulder, not elsewhere classified  ONSET DATE:   SUBJECTIVE:      SUBJECTIVE STATEMENT TODAY:  Pt had noticed her hips have more mobility.  Pt saw her PCP and took her off bladder medications. Pt did not find the medications to help. Pt is going to see Dr. Frutoso Jing tomorrow as referred by PCP.     SUBJECTIVE STATEMENT ON EVAL 02/19/23: 1) urinary frequency and urgency:   pt goes to the toilet once every 2 hours during day, every hour at night ( bedtime 11pm, wakes up 7am, drink wine 7pm, pt does not take any direutics) . Pt has to urinate again after leaving house 20 -30 min. Daily fluid intake:  16 fl oz water, 32 floz unsweeten tea, no soda, no coffee, 10 oz of wine per night.  SUI with sneezing   2) CLBP :  Hx of spinal fusion 2023. 6/10 pain after standing to cook for > 10 min.  Denied radiating pain   3) R shoulder pain:  since a fall in Jan 2024:  not able to fold laundry with lateral movement to the R, reaching up to put away dishes, to do hair, turning steering wheel in car  occurs with 4/10 . Denied radiating . Sleeps on both sides but no pillow between knees   PERTINENT HISTORY:    PAIN:  Are you having pain? Yes: see above   PRECAUTIONS: None  WEIGHT BEARING RESTRICTIONS: No  FALLS:  Has patient fallen in last 6 months? No  LIVING ENVIRONMENT: Lives with: lives with their spouse Lives in: house  Stairs: 3 STE with  rail  Has following equipment at home: No  OCCUPATION: Programmer, systems , hobbies: reading, cooking, going out with friends, card games, sewing   PLOF: Independent  PATIENT GOALS:  Pain free in shoulder, resume lifestyles includes walking , going to gym, participating in social function    OBJECTIVE:      [facility] PT Assessment - 10/31/23 1502       AROM   Overall AROM Comments increased hamstring mobility, less cramping except with new stretches with strap      Palpation   Palpation comment tightness along lateral kne, hypomobile tib /fib  lateral leg, hamstring tightness, tightness between ray II-III lateral /intermedial cuneiform             [facility] Adult PT Treatment/Exercise - 10/31/23 1505       Self-Care   Other Self-Care Comments  discussed withholding internal pelvic floor assessment after pt has further medical l f/u with urogynecologist  re: intermittent brown vaginal discharge      Neuro Re-ed    Neuro Re-ed Details  cued for stretches with yoga strap for toe abudction and hamstring s      Manual Therapy   Manual therapy comments STM/MWM / distraction at ankle/ knee, mm indicated in assessment to promote ER of knee, DF/EV,  toe abudction               HOME EXERCISE PROGRAM:  See pt isntructions      ASSESSMENT:     Continued to apply regional interdependent approach to address tight pelvic floor mm.  External assessment  last session showed L pelvic floor mm tightness which is likely related to L hamstring / midfoot mobility. Pt has gained more flexibility with hamstring overall with past Tx and compliance to stretches with less cramping of calf during bed mobility t/f .  Manual Tx was applied to address these limitations at L [facility] to minimize adducted knee, adducted toes which will further help with tight pelvic floor mm. Pt demo'd improved toe abduction , more DF/EV, and hamstring mobility post Tx on LLE. Pt had Hx of wearing high heels for 40 years which is  a factor related to the limited DF/EV , toe abduction, presentation of tight posterior chain on leg mm and adducted knees. Plan to continue to further improve feet/LKC mobility to improve pelvic floor mobility which will help with urinary frequency and urgency.                Discussed withholding internal pelvic floor assessment after pt has further medical f/u with urogynecologist  re: intermittent brown vaginal discharge.     Pt will benefit from further skilled therapy to address these deficits in order  to improve functional mobility, QOL, and achieve urinary goals .     OBJECTIVE IMPAIRMENTS decreased activity tolerance, decreased coordination, decreased endurance, decreased mobility, difficulty walking, decreased ROM, decreased strength, decreased safety awareness, hypomobility, increased muscle spasms, impaired flexibility, improper body mechanics, postural dysfunction, and pain. scar restrictions   ACTIVITY LIMITATIONS  self-care,  sleep, home chores, work tasks    PARTICIPATION LIMITATIONS:  community, ADLs, activities    PERSONAL FACTORS       are also affecting patient's functional outcome.    REHAB POTENTIAL: Good   CLINICAL DECISION MAKING: Evolving/moderate complexity   EVALUATION COMPLEXITY: Moderate    PATIENT EDUCATION:    Education details: HEP  Person educated: Patient Education method: Explanation, Demonstration, Tactile cues, Verbal cues, and Handouts Education comprehension: verbalized understanding, returned demonstration, verbal cues required, tactile cues required, and needs further education     PLAN: PT FREQUENCY: 1x/week   PT DURATION: 12 weeks   PLANNED INTERVENTIONS: Therapeutic exercises, Therapeutic activity, Neuromuscular re-education, Balance training, Gait training, Patient/Family education, Self Care, Joint mobilization, Spinal mobilization, Moist heat, Taping, and Manual therapy, dry needling.    GOALS: Goals reviewed with patient?  Yes  SHORT TERM GOALS: Target date: 03/19/2023    Pt will demo IND with HEP                    Baseline: Not IND            Goal status: MET    LONG TERM GOALS: Target date: 11/21/2023       1.Pt will demo proper deep core coordination without chest breathing and optimal excursion of diaphragm/pelvic floor in order to promote spinal stability and pelvic floor function  Baseline: dyscoordination Goal status:  MET   2.  Pt will demo > 5 pt change on FOTO  to improve QOL and function  PFDI Urinary baseline - 29 Lower score = better function    PFDI Prolapse  29    Lower score = better function   Pelvic Pain baseline - 8 Lower score = better function    PFDI Bowel -33 Higher score = better function  Lumber baseline  - 50 Higher score = better function   Goal status: DEFERRED   3.  Pt will demo proper body mechanics in against gravity tasks and ADLs  work tasks, fitness  to minimize straining pelvic floor / back    Baseline: not IND, improper form that places strain on pelvic floor  Goal status: Ongoing     4. Pt will demo increased gait speed > 1.4 m/s with reciprocal gait pattern, longer stride length  in order to ambulate safely in community and return to fitness routine  Baseline: R shoulder. iliac crest lowered , 1.29 m/s, limited posterior rotation of R shoulder and pelvis , excessive sway to L  Goal status:MET ( 1.39 m/s)     5. Pt reports < 3/10 pain after standing to cook for > 10 min.   Baseline:  6/10 pain after standing to cook for > 10 min.   Goal status: MET ( 10/22: able to cook for > 15 min before needing to sit down)    6. Pt will regain shoulder flexion to > 135 deg and reach behind head is with digit III at T1 in order to fix her hair and to able to fold laundry with lateral movement to the R, reaching up to put away dishes, to do hair, turning steering wheel in car  Baseline: Shoulder 90 deg flexion R, L 135 deg  , hands behind head digit III  at top of ear , pain with able to fold laundry with lateral movement to the R, reaching up to put away dishes, to do hair, turning steering wheel in car; 11/20: 96 deg R; 12/30: pt reports she is able to do her hair some with RUE but still somewhat limited due to weakness, 130 deg flexion RUE Goal status: PARTIALLY MET    7. Pt will report increase water intake from 16 fl oz to > 48 fl oz  and decrease bladder irritants ( wine from 10 oz per night to < 5 oz night) , tea 32 fl oz to < 16 fl oz  Baseline:  Daily fluid intake:  16 fl oz water, 32 floz unsweeten tea, no soda, no coffee, 10 oz of wine per night   Goal status: MET ( 48 fl oz water, 16 fl oz tea)    8. Pt will follow with PCP for sleep study to rule in/ out OSA  Baseline: nocturia:  every hours between 11pm to 7am, husband tells her she snores, pt with Hx high BP, cholesterol   Goal status: Deferred  Pt received information about link between nocturia and OSA Pt nocturia decreased from every hour to 2-3 x / night.   9.  Patient will decrease Quick DASH score by > 8 points demonstrating reduced self-reported upper extremity disability. Baseline: 32; 12/30: 25 Goal status: PARTIALLY MET  10. Patient will increase R shoulder ER MMT strength by at least 1/2 point to indicate improvement in functional strength for ADLs. Baseline: 3+/5; 12/30: 4+/5 RUE Goal status: MET    Modesto Andreas, PT, DPT, E-RYT Pelvic Health Physical Therapist [email or web address] [phone]

## 2023-10-31 NOTE — Patient Instructions (Addendum)
 Stretches : (Cuing provided for proper alignment)  Instructions start with Strap on R   Stretches for your legs: LAYING on Back Use upper arms and elbows for stability when pulling strap Opposite knee bent and foot firm in align with hip   Strap on [place]/ toes spread    Hamstring _knee bends  10 reps  With knee pointing straight ( slightly to outside to minimize snapping sensation)      10 reps with knee pointing out towards armpit ( notice the stretch in the medial hamstring muscle)    ___  Pedicure spreaders in toes for 30 min when watching TV

## 2023-11-01 ENCOUNTER — Ambulatory Visit (INDEPENDENT_AMBULATORY_CARE_PROVIDER_SITE_OTHER): Admitting: Obstetrics and Gynecology

## 2023-11-01 ENCOUNTER — Other Ambulatory Visit (HOSPITAL_COMMUNITY)
Admission: RE | Admit: 2023-11-01 | Discharge: 2023-11-01 | Disposition: A | Discharge status: Home / Self Care | Source: Ambulatory Visit | Attending: Obstetrics and Gynecology | Admitting: Obstetrics and Gynecology

## 2023-11-01 ENCOUNTER — Encounter: Payer: Self-pay | Admitting: Obstetrics and Gynecology

## 2023-11-01 VITALS — BP 140/78 | HR 90

## 2023-11-01 DIAGNOSIS — N898 Other specified noninflammatory disorders of vagina: Secondary | ICD-10-CM | POA: Diagnosis not present

## 2023-11-01 DIAGNOSIS — N3281 Overactive bladder: Secondary | ICD-10-CM

## 2023-11-01 DIAGNOSIS — N950 Postmenopausal bleeding: Secondary | ICD-10-CM | POA: Diagnosis not present

## 2023-11-01 MED ORDER — CIPROFLOXACIN HCL 250 MG PO TABS: 500.0000 mg | ORAL_TABLET | Freq: Every day | ORAL | 0 refills | Status: AC

## 2023-11-01 NOTE — Progress Notes (Signed)
 [facility] Return Visit  SUBJECTIVE  History of Present Illness: Mercedes [name] is a 78 y.o. female seen in follow-up for OAB. Patient has tried and failed Myrbetriq , Gemtesa , and Trospium . She reports she has been having significant bladder spasms and leakage. Has also had a few episodes of FI in the last 4 weeks.    Has noticed a slight discharge like mustard from the vagina. Aptima swab obtained a few weeks ago and was negative. She also has some itching. Reports some vaginal spotting a few weeks ago as well. No longer using the estradiol  cream. She is using coconut oil for moisture.   Past Medical History: Patient  has a past medical history of Allergy, Cataract, Diverticulosis of colon, Fatty liver, Gout, HLD (hyperlipidemia), HTN (hypertension), Hypothyroidism, Macular degeneration, Osteoarthritis, Rapid heartbeat, and Tobacco abuse.   Past Surgical History: She  has a past surgical history that includes Sigmoidoscopy; Colonoscopy; Cholecystectomy (N/A, 03/17/2014); Cataract extraction; lower back surgery ; and Parathyroidectomy (Left, 08/20/2022).   Medications: She has a current medication list which includes the following prescription(s): albuterol , amlodipine , ascorbic acid, cholecalciferol, ciprofloxacin , colchicine , cranberry, vabysmo , levothyroxine , levothyroxine , metoprolol , mirabegron  er, multivitamin with minerals, preservision areds 2, NON FORMULARY, omega 3, potassium, pravastatin , psyllium, pumpkin seed-soy germ, turmeric, and zinc  gluconate.   Allergies: Patient is allergic to ace inhibitors, clarithromycin, fenofibrate, lipitor [atorvastatin ], niacin, penicillins, sulfasalazine, tetracycline hcl, and macrobid  [nitrofurantoin ].   Social History: Patient  reports that she quit smoking about 13 years ago. Her smoking use included cigarettes. She started smoking about 28 years ago. She has a 3.8 pack-year smoking history. She has never used smokeless tobacco.  She reports current alcohol use. She reports that she does not use drugs.     OBJECTIVE     Physical Exam: Vitals:   11/01/23 1405  BP: (!) 140/78  Pulse: 90   Gen: No apparent distress, A&O x 3.  Detailed Urogynecologic Evaluation:  White discharge on labia minora- aptima obtained. A spot of orange was also noted, but no discharge on speculum. Normal vaginal mucosa and normal appearing cervix. On bimanual, uterus is small, mobile and nontender.   Normal rectovaginal exam, rectocele present.     ASSESSMENT AND PLAN    Ms. Widhalm is a 78 y.o. with:  1. Overactive bladder   2. PMB (postmenopausal bleeding)   3. Vaginal discharge     - Kaitlin had previously discussed third line therapies for OAB. Reviewed again the options of botox and SNM.  - For refractory OAB we reviewed the procedure for intravesical Botox injection with cystoscopy in the office and reviewed the risks, benefits and alternatives of treatment including but not limited to infection, need for self-catheterization and need for repeat therapy.  We discussed that there is a 5-15% chance of needing to catheterize with Botox and that this usually resolves in a few months; however can persist for longer periods of time.  Typically Botox injections would need to be repeated every 3-12 months since this is not a permanent therapy.  - We discussed the role of sacral neuromodulation and how it works. It requires a test phase, and documentation of bladder function via diary. After a successful test period, a permanent wire and generator are placed in the OR. The battery lasts 5 years on average and would need to be replaced surgically.  The goal of this therapy is at least a 50% improvement in symptoms. It is NOT realistic to expect a 100% cure.  We reviewed the  fact that about 30% of patients fail the test phase and are not candidates for permanent generator placement.   - She is interested in intravesical botox. Ciprofloxacin   ordered for the procedure.  - Will also obtain pelvic US  due to postmenopausal bleeding.   Arma Lamp, MD

## 2023-11-04 ENCOUNTER — Encounter: Payer: Self-pay | Admitting: Obstetrics and Gynecology

## 2023-11-04 LAB — CERVICOVAGINAL ANCILLARY ONLY
Bacterial Vaginitis (gardnerella): NEGATIVE
Candida Glabrata: NEGATIVE
Candida Vaginitis: NEGATIVE
Comment: NEGATIVE
Comment: NEGATIVE
Comment: NEGATIVE

## 2023-11-08 ENCOUNTER — Other Ambulatory Visit: Payer: Self-pay | Admitting: Family Medicine

## 2023-11-08 ENCOUNTER — Ambulatory Visit
Admission: RE | Admit: 2023-11-08 | Discharge: 2023-11-08 | Disposition: A | Discharge status: Home / Self Care | Source: Ambulatory Visit | Attending: Obstetrics and Gynecology | Admitting: Obstetrics and Gynecology

## 2023-11-08 DIAGNOSIS — N950 Postmenopausal bleeding: Secondary | ICD-10-CM | POA: Insufficient documentation

## 2023-11-11 DIAGNOSIS — H353221 Exudative age-related macular degeneration, left eye, with active choroidal neovascularization: Secondary | ICD-10-CM | POA: Diagnosis not present

## 2023-11-11 DIAGNOSIS — H353211 Exudative age-related macular degeneration, right eye, with active choroidal neovascularization: Secondary | ICD-10-CM | POA: Diagnosis not present

## 2023-11-15 ENCOUNTER — Ambulatory Visit: Payer: Self-pay | Admitting: Obstetrics and Gynecology

## 2023-11-17 ENCOUNTER — Other Ambulatory Visit: Payer: Self-pay | Admitting: Family Medicine

## 2023-12-06 ENCOUNTER — Other Ambulatory Visit (HOSPITAL_COMMUNITY)
Admission: RE | Admit: 2023-12-06 | Discharge: 2023-12-06 | Disposition: A | Discharge status: Home / Self Care | Source: Ambulatory Visit | Attending: Obstetrics and Gynecology | Admitting: Obstetrics and Gynecology

## 2023-12-06 ENCOUNTER — Ambulatory Visit: Admitting: Obstetrics and Gynecology

## 2023-12-06 ENCOUNTER — Encounter: Payer: Self-pay | Admitting: Obstetrics and Gynecology

## 2023-12-06 VITALS — BP 148/62 | HR 80

## 2023-12-06 DIAGNOSIS — N719 Inflammatory disease of uterus, unspecified: Secondary | ICD-10-CM | POA: Diagnosis not present

## 2023-12-06 DIAGNOSIS — R8290 Unspecified abnormal findings in urine: Secondary | ICD-10-CM | POA: Insufficient documentation

## 2023-12-06 DIAGNOSIS — N950 Postmenopausal bleeding: Secondary | ICD-10-CM

## 2023-12-06 DIAGNOSIS — R319 Hematuria, unspecified: Secondary | ICD-10-CM

## 2023-12-06 DIAGNOSIS — R350 Frequency of micturition: Secondary | ICD-10-CM

## 2023-12-06 LAB — URINALYSIS, ROUTINE W REFLEX MICROSCOPIC
Bilirubin Urine: NEGATIVE
Glucose, UA: NEGATIVE mg/dL
Ketones, ur: NEGATIVE mg/dL
Nitrite: NEGATIVE
Protein, ur: 100 mg/dL — AB
RBC / HPF: >50 RBC/hpf (ref 0–5)
Specific Gravity, Urine: 1.018 (ref 1.005–1.030)
WBC, UA: >50 WBC/hpf (ref 0–5)
pH: 5 (ref 5.0–8.0)

## 2023-12-06 LAB — POCT URINALYSIS DIP (CLINITEK)
Bilirubin, UA: NEGATIVE
Glucose, UA: NEGATIVE mg/dL
Ketones, POC UA: NEGATIVE mg/dL
Nitrite, UA: NEGATIVE
POC PROTEIN,UA: >=300 — AB
Spec Grav, UA: 1.025 (ref 1.010–1.025)
Urobilinogen, UA: 0.2 U/dL
pH, UA: 5.5 (ref 5.0–8.0)

## 2023-12-06 MED ORDER — CIPROFLOXACIN HCL 500 MG PO TABS: 500.0000 mg | ORAL_TABLET | Freq: Two times a day (BID) | ORAL | 0 refills | Status: DC

## 2023-12-06 NOTE — Patient Instructions (Signed)
 Do your best to try to get the self cathing at home. You know where the urethra is and I have full faith that in the comfort of your home you will be able to relax and get it.   You may notice more spotting the next few days. This is normal after the biopsy.   We should get the results on Monday or over the weekend for the biopsy.

## 2023-12-06 NOTE — Addendum Note (Signed)
 Addended by: Letisia Schwalb N on: 12/06/2023 03:31 PM   Modules accepted: Orders

## 2023-12-06 NOTE — Progress Notes (Signed)
 [facility] Return Visit  SUBJECTIVE  History of Present Illness: Mercedes [name] is a 78 y.o. female seen in follow-up for self cath teaching and Endometrial biopsy.     Past Medical History: Patient  has a past medical history of Allergy, Cataract, Diverticulosis of colon, Fatty liver, Gout, HLD (hyperlipidemia), HTN (hypertension), Hypothyroidism, Macular degeneration, Osteoarthritis, Rapid heartbeat, and Tobacco abuse.   Past Surgical History: She  has a past surgical history that includes Sigmoidoscopy; Colonoscopy; Cholecystectomy (N/A, 03/17/2014); Cataract extraction; lower back surgery ; and Parathyroidectomy (Left, 08/20/2022).   Medications: She has a current medication list which includes the following prescription(s): albuterol , amlodipine , ascorbic acid, cholecalciferol, ciprofloxacin , colchicine , cranberry, vabysmo , levothyroxine , levothyroxine , metoprolol , mirabegron  er, multivitamin with minerals, preservision areds 2, NON FORMULARY, omega 3, potassium, pravastatin , psyllium, pumpkin seed-soy germ, turmeric, and zinc  gluconate.   Allergies: Patient is allergic to ace inhibitors, clarithromycin, fenofibrate, lipitor [atorvastatin ], niacin, penicillins, sulfasalazine, tetracycline hcl, and macrobid  [nitrofurantoin ].   Social History: Patient  reports that she quit smoking about 13 years ago. Her smoking use included cigarettes. She started smoking about 28 years ago. She has a 3.8 pack-year smoking history. She has never used smokeless tobacco. She reports current alcohol use. She reports that she does not use drugs.     OBJECTIVE    Lab Results  Component Value Date   COLORU yellow 12/06/2023   CLARITYU clear 12/06/2023   GLUCOSEUR negative 12/06/2023   BILIRUBINUR negative 12/06/2023   KETONESU Negative 03/05/2023   SPECGRAV 1.025 12/06/2023   RBCUR large (A) 12/06/2023   PHUR 5.5 12/06/2023   PROTEINUR 100 (A) 03/05/2023   UROBILINOGEN 0.2  12/06/2023   LEUKOCYTESUR Small (1+) (A) 12/06/2023     Physical Exam: Vitals:   12/06/23 0759  BP: (!) 148/62  Pulse: 80   Gen: No apparent distress, A&O x 3.  Detailed Urogynecologic Evaluation:  Patient's cervix was numbed with lidocaine  jelly for 5 minutes and then cleaned with Hibaclens. The pipette for endometrial collection was passed x2 with good tissue return and placed in formulin with patient label to be sent out. Patient tolerated procedure well.   Ludivina Safe, CMA and this NP attempted to teach patient to self cath using a large standing mirrior. Patient's urethra was hard to locate around vaginal tissues. Patient was able to feel the differentiation in the catheter being in the urethra vs. Vagina but was not successful in cathetarizing laying down or on the toilet. Patient will plan to try to cathetarize at home with less pressure of being in the office setting and will let us  know if she is successful.     ASSESSMENT AND PLAN    Mercedes [name] is a 78 y.o. with:  1. Frequency of urination   2. Abnormal urine   3. PMB (postmenopausal bleeding)   4. Hematuria, unspecified type    Patient's urine is concerning for infection. In the setting of doing self cath teaching and having her continue to attempt to cath at home, will plan for her to start Cipro  500mg  x2 daily for 3 days.  Will send urine for micro and culture.  Endometrial biopsy obtained today.  Attempted self cath teaching. See above information. Patient given tools to practice at home.   Kaysia Willard G Lorrie Strauch, NP

## 2023-12-09 ENCOUNTER — Ambulatory Visit: Payer: Self-pay | Admitting: Obstetrics and Gynecology

## 2023-12-09 ENCOUNTER — Ambulatory Visit: Payer: Self-pay

## 2023-12-09 LAB — URINE CULTURE

## 2023-12-09 NOTE — Telephone Encounter (Signed)
Noted. Thanks. Will see at OV.  

## 2023-12-09 NOTE — Telephone Encounter (Signed)
 Copied from CRM [id number]. Topic: Clinical - Red Word Triage >> Dec 09, 2023  1:53 PM Adrionna Y wrote: Red Word that prompted transfer to Nurse Triage: swelling  Patient stated she been having swelling in her feet and ankles, cannot even put shoes on  Reason for Disposition  MILD or MODERATE ankle swelling (e.g., can't move joint normally, can't do usual activities) (Exceptions: Itchy, localized swelling; swelling is chronic.)  Answer Assessment - Initial Assessment Questions 1. LOCATION: "Which ankle is swollen?" "Where is the swelling?"     Bilateral ankles/feet  2. ONSET: "When did the swelling start?"     3-4 weeks ago 3. SWELLING: "How bad is the swelling?" Or, "How large is it?" (e.g., mild, moderate, severe; size of localized swelling)    - NONE: No joint swelling.   - LOCALIZED: Localized; small area of puffy or swollen skin (e.g., insect bite, skin irritation).   - MILD: Joint looks or feels mildly swollen or puffy.   - MODERATE: Swollen; interferes with normal activities (e.g., work or school); decreased range of movement; may be limping.   - SEVERE: Very swollen; can't move swollen joint at all; limping a lot or unable to walk.     Moderate 4. PAIN: "Is there any pain?" If Yes, ask: "How bad is it?" (Scale 1-10; or mild, moderate, severe)   - NONE (0): no pain.   - MILD (1-3): doesn't interfere with normal activities.    - MODERATE (4-7): interferes with normal activities (e.g., work or school) or awakens from sleep, limping.    - SEVERE (8-10): excruciating pain, unable to do any normal activities, unable to walk.      None 5. CAUSE: "What do you think caused the ankle swelling?"     Unsure 6. OTHER SYMPTOMS: "Do you have any other symptoms?" (e.g., fever, chest pain, difficulty breathing, calf pain)     No  Protocols used: Ankle Swelling-A-AH   FYI Only or Action Required?: FYI only for provider  Patient was last seen in primary care on 10/07/2023 by Scherrie Curt,  MD. Called Nurse Triage reporting Joint Swelling. Symptoms began several weeks ago. Interventions attempted: Nothing. Symptoms are: gradually worsening.  Triage Disposition: See PCP When Office is Open (Within 3 Days)  Patient/caregiver understands and will follow disposition?: Yes

## 2023-12-10 ENCOUNTER — Other Ambulatory Visit: Payer: Self-pay

## 2023-12-10 MED ORDER — NITROFURANTOIN MONOHYD MACRO 100 MG PO CAPS: 100.0000 mg | ORAL_CAPSULE | Freq: Every day | ORAL | 1 refills | Status: DC

## 2023-12-10 MED ORDER — AMLODIPINE BESYLATE 5 MG PO TABS: 2.5000 mg | ORAL_TABLET | Freq: Every day | ORAL | 1 refills | Status: DC

## 2023-12-10 NOTE — Telephone Encounter (Addendum)
 D/w pt about her situation, at husband's visit.  L>R ankle edema, noted after starting amlodipine .  Taking 5mg  amlodipine .  Still on metoprolol . No CP.  Ankle feels tight from being puffy.  No trauma.  No h/o DVT.    D/w pt about holding amlodipine  for now, recheck in 2 days as planned.  She agreed with plan.

## 2023-12-10 NOTE — Addendum Note (Signed)
 Addended by: Donnie Galea on: 12/10/2023 10:49 AM   Modules accepted: Orders

## 2023-12-11 LAB — SURGICAL PATHOLOGY

## 2023-12-12 ENCOUNTER — Ambulatory Visit (INDEPENDENT_AMBULATORY_CARE_PROVIDER_SITE_OTHER): Admitting: Family Medicine

## 2023-12-12 ENCOUNTER — Other Ambulatory Visit: Payer: Self-pay | Admitting: Obstetrics and Gynecology

## 2023-12-12 ENCOUNTER — Ambulatory Visit: Admitting: Physical Therapy

## 2023-12-12 VITALS — BP 128/74 | HR 69 | Temp 99.5°F | Ht 64.0 in | Wt 222.6 lb

## 2023-12-12 DIAGNOSIS — M7989 Other specified soft tissue disorders: Secondary | ICD-10-CM | POA: Diagnosis not present

## 2023-12-12 DIAGNOSIS — N950 Postmenopausal bleeding: Secondary | ICD-10-CM

## 2023-12-12 DIAGNOSIS — R0602 Shortness of breath: Secondary | ICD-10-CM

## 2023-12-12 NOTE — Progress Notes (Signed)
 L ankle swelling.  Going on for weeks.  Had been on amlodipine .  Held med the last 2 days.   No change in swelling yet.  No CP,  not SOB.  Still with L>R ankle/foot swelling.    BP controlled.  She has mild SOBOE.     Allergy list updated.  She was able to tolerate macrobid  w/o ADE on med.    Patient is going to follow up with Dr. Adriana Hopping, d/w pt.    ENDOMETRIUM, BIOPSY: - Predominantly mucus and inflammatory material. - Few strips of atrophic endometrial glandular epithelium. - Few strips of endocervical glandular epithelium with focal squamous metaplasia. - Negative for atypia/EIN and malignancy in this limited sample. - Deeper sections were examined.  Meds, vitals, and allergies reviewed.   ROS: Per HPI unless specifically indicated in ROS section   Nad Ncat Neck supple, no LA Rrr Ctab Abd soft not ttp L>R ankle and foot edema.   Skin well-perfused.

## 2023-12-12 NOTE — Patient Instructions (Addendum)
 Go to the lab on the way out.   If you have mychart we'll likely use that to update you.    Take care.  Glad to see you.  Update me about your situation early next week (BP, swelling).  I would stay off amlodipine  for now.

## 2023-12-12 NOTE — Progress Notes (Signed)
 Referral placed to Dr. Jodeane Mulligan office for hysteroscopy after speaking to patient about Biopsy results. Dr. Frutoso Jing has also reviewed biopsy and agrees with plan of care to refer for hysteroscopy.

## 2023-12-13 ENCOUNTER — Ambulatory Visit
Admission: RE | Admit: 2023-12-13 | Discharge: 2023-12-13 | Disposition: A | Discharge status: Home / Self Care | Source: Ambulatory Visit | Attending: Family Medicine | Admitting: Family Medicine

## 2023-12-13 ENCOUNTER — Other Ambulatory Visit: Payer: Self-pay | Admitting: Family Medicine

## 2023-12-13 ENCOUNTER — Ambulatory Visit: Payer: Self-pay | Admitting: Family Medicine

## 2023-12-13 DIAGNOSIS — M7989 Other specified soft tissue disorders: Secondary | ICD-10-CM | POA: Insufficient documentation

## 2023-12-13 DIAGNOSIS — R600 Localized edema: Secondary | ICD-10-CM | POA: Diagnosis not present

## 2023-12-13 DIAGNOSIS — R7989 Other specified abnormal findings of blood chemistry: Secondary | ICD-10-CM | POA: Diagnosis not present

## 2023-12-13 LAB — CBC WITH DIFFERENTIAL/PLATELET
Absolute Lymphocytes: 4212 {cells}/uL — ABNORMAL HIGH (ref 850–3900)
Absolute Monocytes: 1069 {cells}/uL — ABNORMAL HIGH (ref 200–950)
Basophils Absolute: 54 {cells}/uL (ref 0–200)
Basophils Relative: 0.5 %
Eosinophils Absolute: 130 {cells}/uL (ref 15–500)
Eosinophils Relative: 1.2 %
HCT: 45.1 % — ABNORMAL HIGH (ref 35.0–45.0)
Hemoglobin: 14.9 g/dL (ref 11.7–15.5)
MCH: 32.2 pg (ref 27.0–33.0)
MCHC: 33 g/dL (ref 32.0–36.0)
MCV: 97.4 fL (ref 80.0–100.0)
MPV: 9.8 fL (ref 7.5–12.5)
Monocytes Relative: 9.9 %
Neutro Abs: 5335 {cells}/uL (ref 1500–7800)
Neutrophils Relative %: 49.4 %
Platelets: 245 10*3/uL (ref 140–400)
RBC: 4.63 10*6/uL (ref 3.80–5.10)
RDW: 12.5 % (ref 11.0–15.0)
Total Lymphocyte: 39 %
WBC: 10.8 10*3/uL (ref 3.8–10.8)

## 2023-12-13 LAB — COMPREHENSIVE METABOLIC PANEL WITH GFR
AG Ratio: 1.3 (calc) (ref 1.0–2.5)
ALT: 16 U/L (ref 6–29)
AST: 17 U/L (ref 10–35)
Albumin: 4.2 g/dL (ref 3.6–5.1)
Alkaline phosphatase (APISO): 74 U/L (ref 37–153)
BUN: 17 mg/dL (ref 7–25)
CO2: 29 mmol/L (ref 20–32)
Calcium: 9.9 mg/dL (ref 8.6–10.4)
Chloride: 102 mmol/L (ref 98–110)
Creat: 0.8 mg/dL (ref 0.60–0.95)
Globulin: 3.2 g/dL (ref 1.9–3.7)
Glucose, Bld: 82 mg/dL (ref 65–99)
Potassium: 5 mmol/L (ref 3.5–5.3)
Sodium: 141 mmol/L (ref 135–146)
Total Bilirubin: 0.9 mg/dL (ref 0.2–1.2)
Total Protein: 7.4 g/dL (ref 6.1–8.1)
eGFR: 74 mL/min/{1.73_m2} (ref >=60)

## 2023-12-13 LAB — URIC ACID: Uric Acid, Serum: 7.1 mg/dL — ABNORMAL HIGH (ref 2.5–7.0)

## 2023-12-13 LAB — BRAIN NATRIURETIC PEPTIDE: Brain Natriuretic Peptide: 33 pg/mL (ref <100)

## 2023-12-13 LAB — D-DIMER, QUANTITATIVE: D-Dimer, Quant: 0.69 ug{FEU}/mL — ABNORMAL HIGH (ref <0.50)

## 2023-12-13 NOTE — Telephone Encounter (Signed)
 Mercedes [name]

## 2023-12-13 NOTE — Telephone Encounter (Signed)
 Please see note from Dr Vallarie Gauze who has already addressed this. Sending to [employer].

## 2023-12-14 DIAGNOSIS — M7989 Other specified soft tissue disorders: Secondary | ICD-10-CM | POA: Insufficient documentation

## 2023-12-14 NOTE — Assessment & Plan Note (Signed)
 With some occasional shortness of breath on exertion that is mild. Edema likely from amlodipine . See notes on labs. I asked her to update me about her situation early next week (BP, swelling).  I would stay off amlodipine  for now.

## 2023-12-15 ENCOUNTER — Ambulatory Visit: Payer: Self-pay | Admitting: Family Medicine

## 2023-12-16 ENCOUNTER — Telehealth: Payer: Self-pay

## 2023-12-16 DIAGNOSIS — H353221 Exudative age-related macular degeneration, left eye, with active choroidal neovascularization: Secondary | ICD-10-CM | POA: Diagnosis not present

## 2023-12-16 DIAGNOSIS — H353231 Exudative age-related macular degeneration, bilateral, with active choroidal neovascularization: Secondary | ICD-10-CM | POA: Diagnosis not present

## 2023-12-16 DIAGNOSIS — H353211 Exudative age-related macular degeneration, right eye, with active choroidal neovascularization: Secondary | ICD-10-CM | POA: Diagnosis not present

## 2023-12-16 NOTE — Telephone Encounter (Signed)
 Patient swelling is back

## 2023-12-16 NOTE — Telephone Encounter (Signed)
 Patient notified that provider is in clinic and as soon as I hear back I will reach out.

## 2023-12-16 NOTE — Telephone Encounter (Signed)
 Copied from CRM [id number]. Topic: General - Other >> Dec 16, 2023  8:18 AM Rosaria Common wrote: Reason for CRM: Pt calling to state status update. Took medication colchicine  0.6 MG tablet 2 Sat 2 Sun. Swelling down about 25%. Bloodwork normal. Pt callback number is [phone]. Pt would like to know next steps from provider or providers nurse.

## 2023-12-16 NOTE — Telephone Encounter (Signed)
 Patient notified. She will follow up with us  after the 2 days

## 2023-12-16 NOTE — Telephone Encounter (Signed)
 If the swelling clearly improved with colchicine  use, then I would try taking that daily for 2 days and see if she has continued improvement.  Either way, let me know.  Thanks.

## 2023-12-18 ENCOUNTER — Telehealth: Payer: Self-pay

## 2023-12-18 ENCOUNTER — Ambulatory Visit: Admitting: Physical Therapy

## 2023-12-18 DIAGNOSIS — Z1211 Encounter for screening for malignant neoplasm of colon: Secondary | ICD-10-CM

## 2023-12-18 NOTE — Telephone Encounter (Signed)
 Agree. Thanks

## 2023-12-18 NOTE — Telephone Encounter (Signed)
 Copied from CRM [id number]. Topic: General - Other >> Dec 18, 2023 10:03 AM Mercedes [name] wrote: Reason for CRM: Patient is calling because she took the colchicine  0.6 MG tablet monday and tuesday and she is still having sweeling in her foot, but now she has itching all over as well. There is no pain or tenderness. Her Blood pressure has also been in the range of 148/80  She said call this number [phone]

## 2023-12-18 NOTE — Telephone Encounter (Signed)
 Noted--I will check her at the visit

## 2023-12-18 NOTE — Telephone Encounter (Signed)
 Called patient states she has not had any improvement with the swelling. She has started having itching only on foot that is swollen. She has tried lotion and Cortizone cream with very little to no improvement. Itching comes and goes. It will itch 3-4 times a day for about 10 to 20 minutes. She denies any changes in detergents or creams. Denies any changes at all in the skin.     Blood pressure: she checked her readings this am and it was 140/83. Has been elevated a little after stopping amlodipine . She denies any headache, chest pain or any other symptoms. I did have her check while on the phone. She did say that the monitor at home does read a little high. Her reading at home was:  165/82 hr 64. She has taken all her blood pressure medication today around 8 am. I have scheduled for office visit with Dr. Joelle Musca tomorrow for both foot and blood pressure. I have reviewed all red words if any she will be seen at urgent care or Ed before appointment.

## 2023-12-19 ENCOUNTER — Encounter: Payer: Self-pay | Admitting: Internal Medicine

## 2023-12-19 ENCOUNTER — Ambulatory Visit (INDEPENDENT_AMBULATORY_CARE_PROVIDER_SITE_OTHER): Admitting: Internal Medicine

## 2023-12-19 VITALS — BP 165/93 | HR 60 | Temp 98.2°F | Ht 64.0 in | Wt 220.0 lb

## 2023-12-19 DIAGNOSIS — I10 Essential (primary) hypertension: Secondary | ICD-10-CM | POA: Diagnosis not present

## 2023-12-19 DIAGNOSIS — M25472 Effusion, left ankle: Secondary | ICD-10-CM | POA: Insufficient documentation

## 2023-12-19 NOTE — Assessment & Plan Note (Addendum)
 BP Readings from Last 3 Encounters:  12/19/23 (!) 165/93  12/12/23 128/74  12/06/23 (!) 148/62   Will have her restart the amlodipine  5mg  daily since stopping didn't make a difference Would cut in half or stop if the swelling clearly worsens

## 2023-12-19 NOTE — Progress Notes (Signed)
 Subjective:    Patient ID: Mercedes [name], female    DOB: 09/11/1945, 78 y.o.   MRN: [id number]  HPI Here due to ongoing foot swelling  2 months ago--had bronchitis Got z-pak but it didn't help much Swelling started shortly after that--just on left No tenderness or pain--did try colchicine  briefly, just in case--didn't help Stopped amlodipine  but no help  Rx with urogynecologist for overactive bladder Myrbetriq  didn't help Got creams/compounded topical/extended antibiotic for recurrent UTIs Endometrial biopsy non conclusive--discussed hysteroscopy  DVT not found on vascular studies  Current Outpatient Medications on File Prior to Visit  Medication Sig Dispense Refill   ascorbic acid (VITAMIN C) 500 MG tablet Take 500 mg by mouth daily.     cholecalciferol (VITAMIN D ) 1000 units tablet Take 1,000 Units by mouth 2 (two) times a day.      colchicine  0.6 MG tablet Take 1 tablet by mouth twice daily as needed 60 tablet 0   CRANBERRY PO Take 1 capsule by mouth in the morning and at bedtime.     faricimab -svoa (VABYSMO ) 6 MG/0.05ML SOLN intravitreal injection 0.05 mLs (6 mg total) by Intravitreal route as directed. (Patient taking differently: 6 mg by Intravitreal route as directed. Every 6 weeks) 0.05 mL 0   levothyroxine  (SYNTHROID ) 200 MCG tablet TAKE 1 TABLET BY MOUTH ONCE DAILY BEFORE BREAKFAST 90 tablet 0   levothyroxine  (SYNTHROID ) 50 MCG tablet Take 1 tablet (50 mcg total) by mouth daily before breakfast. Take along with 200 mcg tablet. 90 tablet 3   metoprolol  (TOPROL -XL) 200 MG 24 hr tablet TAKE 1 TABLET BY MOUTH IN THE MORNING 90 tablet 0   Multiple Vitamin (MULTIVITAMIN WITH MINERALS) TABS tablet Take 1 tablet by mouth daily.     Multiple Vitamins-Minerals (PRESERVISION AREDS 2) CAPS Take 1 capsule by mouth 2 (two) times daily.     NON FORMULARY D-MANOOSE     Omega 3 1000 MG CAPS Take 1,000 mg by mouth in the morning and at bedtime.     Potassium 99 MG TABS Take 99 mg by  mouth in the morning and at bedtime.     pravastatin  (PRAVACHOL ) 40 MG tablet Take 1 tablet by mouth once daily 90 tablet 0   psyllium (REGULOID) 0.52 G capsule Take 1.04 g by mouth daily.     Pumpkin Seed-Soy Germ (AZO BLADDER CONTROL/GO-LESS PO) Take by mouth.     TURMERIC PO Take 750 mg by mouth daily.     zinc  gluconate 50 MG tablet Take 50 mg by mouth daily.     No current facility-administered medications on file prior to visit.    Allergies  Allergen Reactions   Ace Inhibitors Cough   Amlodipine      Edema at 5mg  dose.     Clarithromycin     REACTION: GI upset   Fenofibrate Itching    REACTION: itching- pt unsure   Lipitor [Atorvastatin ]     myalgias   Niacin     burning, made pt pass out   Penicillins     REACTION: Purpura   Sulfasalazine     REACTION: Purpura   Tetracycline Hcl     REACTION: GI upset    Past Medical History:  Diagnosis Date   Allergy    Cataract    bilateral-not an issue right now   Diverticulosis of colon    Fatty liver    Gout    HLD (hyperlipidemia)    under control   HTN (hypertension)  Hypothyroidism    Macular degeneration    Osteoarthritis    Rapid heartbeat    occasional, does not last long   Tobacco abuse     Past Surgical History:  Procedure Laterality Date   CATARACT EXTRACTION     CHOLECYSTECTOMY N/A 03/17/2014   Procedure: LAPAROSCOPIC CHOLECYSTECTOMY WITH INTRAOPERATIVE CHOLANGIOGRAM ;  Surgeon: Boyce Byes, MD;  Location: [facility];  Service: General;  Laterality: N/A;   COLONOSCOPY     removed polyps   lower back surgery      PARATHYROIDECTOMY Left 08/20/2022   Procedure: LEFT PARATHYROIDECTOMY;  Surgeon: Oralee Billow, MD;  Location: [facility];  Service: General;  Laterality: Left;   SIGMOIDOSCOPY      Family History  Problem Relation Age of Onset   Angina Mother    Ulcers Father    Hypertension Brother    Prostate cancer Brother    Colon cancer Neg Hx    Breast cancer Neg Hx    Esophageal cancer Neg Hx     Stomach cancer Neg Hx    Rectal cancer Neg Hx    Uterine cancer Neg Hx    Bladder Cancer Neg Hx     Social History   Socioeconomic History   Marital status: Married    Spouse name: Social worker   Number of children: 2   Years of education: Not on file   Highest education level: Master's degree (e.g., MA, MS, MEng, MEd, MSW, MBA)  Occupational History   Occupation: Teacher-retired  Tobacco Use   Smoking status: Former    Current packs/day: 0.00    Average packs/day: 0.3 packs/day for 15.0 years (3.8 ttl pk-yrs)    Types: Cigarettes    Start date: 07/05/1995    Quit date: 07/04/2010    Years since quitting: 13.4   Smokeless tobacco: Never  Vaping Use   Vaping status: Never Used  Substance and Sexual Activity   Alcohol use: Yes    Comment: occas wine   Drug use: No   Sexual activity: Not Currently  Other Topics Concern   Not on file  Social History Narrative   Married 1964   2 kids, in [place].  2 grandkids   Volunteered prev with [employer]   Enjoyed golf but gave it up after her husband had a stroke   Social Drivers of Corporate investment banker Strain: Low Risk  (12/12/2023)   Overall Financial Resource Strain (CARDIA)    Difficulty of Paying Living Expenses: Not hard at all  Food Insecurity: No Food Insecurity (12/12/2023)   Hunger Vital Sign    Worried About Running Out of Food in the Last Year: Never true    Ran Out of Food in the Last Year: Never true  Transportation Needs: No Transportation Needs (12/12/2023)   PRAPARE - Administrator, Civil Service (Medical): No    Lack of Transportation (Non-Medical): No  Physical Activity: Sufficiently Active (12/12/2023)   Exercise Vital Sign    Days of Exercise per Week: 5 days    Minutes of Exercise per Session: 40 min  Stress: No Stress Concern Present (12/12/2023)   [employer] of Occupational Health - Occupational Stress Questionnaire    Feeling of Stress: Only a little  Social Connections:  Socially Integrated (12/12/2023)   Social Connection and Isolation Panel    Frequency of Communication with Friends and Family: More than three times a week    Frequency of Social Gatherings with Friends and Family: Once a  week    Attends Religious Services: More than 4 times per year    Active Member of Clubs or Organizations: Yes    Attends Banker Meetings: 1 to 4 times per year    Marital Status: Married  Catering manager Violence: Not At Risk (12/06/2022)   Humiliation, Afraid, Rape, and Kick questionnaire    Fear of Current or Ex-Partner: No    Emotionally Abused: No    Physically Abused: No    Sexually Abused: No   Review of Systems No injury No new shoes or tasks    Objective:   Physical Exam  Musculoskeletal:     Comments: Isolated soft tissue swelling lateral > medial malleolus No specific joint swelling No tenderness or redness            Assessment & Plan:

## 2023-12-19 NOTE — Assessment & Plan Note (Signed)
 Looks soft tissue--but no pain or injury Definitely not gout and no joint swelling No better off amlodipine  No DVT (though wouldn't suspect it from current appearance) Discussed unclear etiology---would just observe Can try ankle wrap

## 2023-12-30 ENCOUNTER — Ambulatory Visit (INDEPENDENT_AMBULATORY_CARE_PROVIDER_SITE_OTHER): Admitting: Family Medicine

## 2023-12-30 ENCOUNTER — Ambulatory Visit: Payer: Self-pay | Admitting: *Deleted

## 2023-12-30 ENCOUNTER — Other Ambulatory Visit: Payer: Self-pay

## 2023-12-30 ENCOUNTER — Encounter: Payer: Self-pay | Admitting: Family Medicine

## 2023-12-30 VITALS — BP 154/80 | HR 85 | Temp 99.0°F | Ht 64.0 in | Wt 223.8 lb

## 2023-12-30 DIAGNOSIS — M7989 Other specified soft tissue disorders: Secondary | ICD-10-CM | POA: Diagnosis not present

## 2023-12-30 MED ORDER — EYLEA 2 MG/0.05ML IZ SOLN: INTRAVITREAL | Status: AC

## 2023-12-30 MED ORDER — PREDNISONE 10 MG PO TABS
ORAL_TABLET | ORAL | 0 refills | Status: DC
  Filled 2023-12-30: qty 15, 10d supply, fill #0

## 2023-12-30 NOTE — Progress Notes (Unsigned)
 Variable BP after stopping amlodipine .  Some BP readings at goal.  Off amlodipine  now, off for the last 3 days.  Itching started after the last OV.    She has itching on the L>>R side of the body.  Faint maculopapular rash.  Itching intermittently.    No clear cause seen for her sx.  No triggers.  No new soaps or triggers known.  No FCNAVD.    No foot pain.  Can still walk.  L ankle edema improves at night.    No wheeze.  No lip or tongue swelling.  No tick bites.  Itching escalated about 10 days ago.    Meds, vitals, and allergies reviewed.   ROS: Per HPI unless specifically indicated in ROS section   Nad Ncat Neck supple, no LA Rrr Ctab No lip or tongue swelling.  Faint rash on the L foot, L elbow.  1+ L ankle and foot edema.

## 2023-12-30 NOTE — Patient Instructions (Signed)
 Stay off amlodipine .  Start prednisone  with food.  Update me with a call on Wednesday.  Take care.  Glad to see you.

## 2023-12-30 NOTE — Telephone Encounter (Signed)
 FYI Only or Action Required?: FYI only for provider.  Patient was last seen in primary care on 12/19/2023 by Jimmy Charlie FERNS, MD. Called Nurse Triage reporting Leg Swelling. Symptoms began several weeks ago. Interventions attempted: Prescription medications: Amlodipine - had to stop. Symptoms are: gradually worsening.  Triage Disposition: See PCP When Office is Open (Within 3 Days) yes   Reason for Disposition  [1] MILD swelling of both ankles (i.e., pedal edema) AND [2] new-onset or worsening  Answer Assessment - Initial Assessment Questions 1. ONSET: When did the swelling start? (e.g., minutes, hours, days)     Ongoing- hives, itching on Amlodipine  (stopped Friday) 2. LOCATION: What part of the leg is swollen?  Are both legs swollen or just one leg?     175/97,136/74, today 136/83 3. SEVERITY: How bad is the swelling? (e.g., localized; mild, moderate, severe)   - Localized: Small area of swelling localized to one leg.   - MILD pedal edema: Swelling limited to foot and ankle, pitting edema < 1/4 inch (6 mm) deep, rest and elevation eliminate most or all swelling.   - MODERATE edema: Swelling of lower leg to knee, pitting edema > 1/4 inch (6 mm) deep, rest and elevation only partially reduce swelling.   - SEVERE edema: Swelling extends above knee, facial or hand swelling present.      Ankle/leg- left 4. REDNESS: Does the swelling look red or infected?     no 5. PAIN: Is the swelling painful to touch? If Yes, ask: How painful is it?   (Scale 1-10; mild, moderate or severe)     no 6. FEVER: Do you have a fever? If Yes, ask: What is it, how was it measured, and when did it start?      no 7. CAUSE: What do you think is causing the leg swelling?     Unsure- uncontrolled BP  9. RECURRENT SYMPTOM: Have you had leg swelling before? If Yes, ask: When was the last time? What happened that time?     no 10. OTHER SYMPTOMS: Do you have any other symptoms? (e.g., chest  pain, difficulty breathing)       Rash, uncontrolled BP  Protocols used: Leg Swelling and Edema-A-AH     Patient/caregiver understands and will follow disposition?:  Copied from CRM [id number]. Topic: Clinical - Red Word Triage >> Dec 30, 2023  8:11 AM Deaijah H wrote: Red Word that prompted transfer to Nurse Triage: Swelling no better, BP still high

## 2023-12-30 NOTE — Telephone Encounter (Signed)
Noted. Will see at OV.  Thanks.  

## 2023-12-31 ENCOUNTER — Other Ambulatory Visit (HOSPITAL_COMMUNITY): Payer: Self-pay

## 2024-01-01 ENCOUNTER — Encounter: Admitting: Physical Therapy

## 2024-01-01 NOTE — Assessment & Plan Note (Signed)
 Discussed options.  Okay for outpatient follow-up. Stay off amlodipine .  Start prednisone  with food.  I asked her to update me with a call on Wednesday.  Unclear if the rash in the swelling are related.  It may be the case that prednisone  treats both, even if they are from separate issues.

## 2024-01-01 NOTE — Telephone Encounter (Signed)
 Duly noted.  Thanks for the update.  If she is feeling better then I would continue as is, meaning finish the prednisone  and stay off amlodipine .  Please let me know how she is feeling next week, in terms of itching/rash/swelling/blood pressure.  Thanks.

## 2024-01-01 NOTE — Telephone Encounter (Unsigned)
 Copied from CRM [id number]. Topic: General - Other >> Jan 01, 2024  3:00 PM Armenia J wrote: Reason for CRM: Patient wanted to let Dr. Cleatus know that the itching hives she had are definitely better, the swelling in her ankle and foot may be slightly better but it is difficult to discern at this point. Her blood pressure yesterday was 140/78, and this morning it read at 130/53. Lastly, in between Monday-today, she has had six prednisone  tablets.

## 2024-01-06 ENCOUNTER — Other Ambulatory Visit: Payer: Self-pay | Admitting: *Deleted

## 2024-01-06 ENCOUNTER — Other Ambulatory Visit: Payer: Self-pay | Admitting: Family Medicine

## 2024-01-06 MED ORDER — PREDNISONE 10 MG PO TABS: ORAL_TABLET | ORAL | 0 refills | Status: DC

## 2024-01-06 NOTE — Telephone Encounter (Signed)
 error

## 2024-01-06 NOTE — Telephone Encounter (Signed)
 I thank her for her effort and I hope she gets power back soon.  Would stay off amlodipine  and finish the prednisone , then see how she is feeling after finishing the prednisone .    If swelling or hives get worse, then restart prednisone  taper.  I sent the next rx.   If still better, no need to restart prednisone .    Please let me know if BP is persistently elevated.    Thanks.
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
## 2024-01-07 ENCOUNTER — Other Ambulatory Visit: Payer: Self-pay

## 2024-01-07 ENCOUNTER — Other Ambulatory Visit (HOSPITAL_COMMUNITY): Payer: Self-pay

## 2024-01-07 ENCOUNTER — Other Ambulatory Visit: Payer: Self-pay | Admitting: Family Medicine

## 2024-01-07 MED ORDER — METOPROLOL SUCCINATE ER 200 MG PO TB24
200.0000 mg | ORAL_TABLET | Freq: Every morning | ORAL | 0 refills | Status: DC
  Filled 2024-01-07: qty 90, 90d supply, fill #0

## 2024-01-07 NOTE — Telephone Encounter (Signed)
 Copied from CRM [id number]. Topic: Clinical - Medication Refill >> Jan 07, 2024 10:39 AM Geroldine GRADE wrote: Medication: metoprolol  (TOPROL -XL) 200 MG 24 hr tablet   Has the patient contacted their pharmacy? Yes (Agent: If no, request that the patient contact the pharmacy for the refill. If patient does not wish to contact the pharmacy document the reason why and proceed with request.) (Agent: If yes, when and what did the pharmacy advise?)  This is the patient's preferred pharmacy:   [facility] [address] [place] [address] Phone: [phone] Fax: [phone]  Is this the correct pharmacy for this prescription? Yes If no, delete pharmacy and type the correct one.   Has the prescription been filled recently? No  Is the patient out of the medication? No, transferring pharmacies  Has the patient been seen for an appointment in the last year OR does the patient have an upcoming appointment? Yes  Can we respond through MyChart? Yes  Agent: Please be advised that Rx refills may take up to 3 business days. We ask that you follow-up with your pharmacy.

## 2024-01-08 NOTE — Telephone Encounter (Unsigned)
 Copied from CRM [id number]. Topic: Clinical - Prescription Issue >> Jan 08, 2024 11:49 AM Geroldine GRADE wrote: Reason for CRM: Patient is calling because her predniSONE  was sent to the incorrect pharmacy she is wondering if it can be sent to [facility] - [facility] [address], [place] [address] Phone: [phone]  Fax: [phone]

## 2024-01-10 ENCOUNTER — Other Ambulatory Visit: Payer: Self-pay

## 2024-01-10 ENCOUNTER — Other Ambulatory Visit: Payer: Self-pay | Admitting: Family Medicine

## 2024-01-10 MED ORDER — PREDNISONE 10 MG PO TABS
ORAL_TABLET | ORAL | 0 refills | Status: DC
  Filled 2024-01-10: qty 15, 10d supply, fill #0

## 2024-01-10 NOTE — Telephone Encounter (Signed)
 Patient notified

## 2024-01-10 NOTE — Telephone Encounter (Signed)
 I resent it to [facility] - [facility].  Let me know if she can't get it filled.  Thanks.

## 2024-01-20 DIAGNOSIS — H353221 Exudative age-related macular degeneration, left eye, with active choroidal neovascularization: Secondary | ICD-10-CM | POA: Diagnosis not present

## 2024-01-20 DIAGNOSIS — H353231 Exudative age-related macular degeneration, bilateral, with active choroidal neovascularization: Secondary | ICD-10-CM | POA: Diagnosis not present

## 2024-01-20 DIAGNOSIS — H353211 Exudative age-related macular degeneration, right eye, with active choroidal neovascularization: Secondary | ICD-10-CM | POA: Diagnosis not present

## 2024-01-23 ENCOUNTER — Ambulatory Visit: Admitting: Obstetrics & Gynecology

## 2024-01-23 ENCOUNTER — Encounter: Payer: Self-pay | Admitting: Obstetrics & Gynecology

## 2024-01-23 VITALS — BP 158/75 | HR 68 | Wt 227.0 lb

## 2024-01-23 DIAGNOSIS — N950 Postmenopausal bleeding: Secondary | ICD-10-CM | POA: Diagnosis not present

## 2024-01-23 NOTE — Progress Notes (Signed)
 GYNECOLOGY OFFICE VISIT NOTE  History:  Mercedes [name] is a 78 y.o. G2P0 here today for discussion about further evaluation of postmenopausal bleeding for a few months. Notices pink discharge when she wipes, this occurs about 1-2 times a week over the last few months.  Ultrasound in May 2025 showed 8 mm heterogenous endometrium.  She underwent an endometrial biopsy on 12/06/23 by her Urogynecology team (she sees them for urinary issues), this showed atrophic endometrium, no atypia/EIN.  She has continued to have the pink discharge so Hysteroscopy was recommended.  She denies any abnormal vaginal discharge or pelvic pain. Still concerned about her urinary frequency and urgency every hours, wants to follow up with Urogynecology.  Past Medical History:  Diagnosis Date   Allergy    Cataract    bilateral-not an issue right now   Diverticulosis of colon    Fatty liver    Gout    HLD (hyperlipidemia)    under control   HTN (hypertension)    Hypothyroidism    Macular degeneration    Osteoarthritis    Rapid heartbeat    occasional, does not last long   Tobacco abuse     Past Surgical History:  Procedure Laterality Date   CATARACT EXTRACTION     CHOLECYSTECTOMY N/A 03/17/2014   Procedure: LAPAROSCOPIC CHOLECYSTECTOMY WITH INTRAOPERATIVE CHOLANGIOGRAM ;  Surgeon: Elon Pacini, MD;  Location: [facility];  Service: General;  Laterality: N/A;   COLONOSCOPY     removed polyps   lower back surgery      PARATHYROIDECTOMY Left 08/20/2022   Procedure: LEFT PARATHYROIDECTOMY;  Surgeon: Eletha Boas, MD;  Location: [facility];  Service: General;  Laterality: Left;   SIGMOIDOSCOPY      The following portions of the patient's history were reviewed and updated as appropriate: allergies, current medications, past family history, past medical history, past social history, past surgical history and problem list.   Health Maintenance:  Normal mammogram on 08/26/2023.   Review of Systems:  Pertinent items  noted in HPI and remainder of comprehensive ROS otherwise negative.  Physical Exam:  BP (!) 158/75   Pulse 68   Wt 227 lb (103 kg)   BMI 38.96 kg/m  CONSTITUTIONAL: Well-developed, well-nourished female in no acute distress.  SKIN: No rash noted. Not diaphoretic. No erythema. No pallor. MUSCULOSKELETAL: Normal range of motion. No edema noted. NEUROLOGIC: Alert and oriented to person, place, and time. Normal muscle tone coordination. No cranial nerve deficit noted on observation. PSYCHIATRIC: Normal mood and affect. Normal behavior. Normal judgment and thought content. CARDIOVASCULAR: Normal heart rate noted RESPIRATORY: Effort and breath sounds normal, no problems with respiration noted ABDOMEN: No masses or other overt distention noted on observation. No tenderness.   PELVIC: Deferred  Labs and Imaging 12/06/2023 ENDOMETRIUM, BIOPSY:  - Predominantly mucus and inflammatory material.  - Few strips of atrophic endometrial glandular epithelium.  - Few strips of endocervical glandular epithelium with focal squamous metaplasia.  - Negative for atypia/EIN and malignancy in this limited sample.  - Deeper sections were examined   US  PELVIC COMPLETE WITH TRANSVAGINAL Result Date: 11/08/2023 CLINICAL DATA:  Postmenopausal bleeding EXAM: TRANSABDOMINAL AND TRANSVAGINAL ULTRASOUND OF PELVIS TECHNIQUE: Both transabdominal and transvaginal ultrasound examinations of the pelvis were performed. Transabdominal technique was performed for global imaging of the pelvis including uterus, ovaries, adnexal regions, and pelvic cul-de-sac. It was necessary to proceed with endovaginal exam following the transabdominal exam to visualize the endometrium. COMPARISON:  None Available. FINDINGS: Uterus Measurements: 6.1 x  3.0 x 3.8 cm = volume: 36.1 mL. No fibroids or other mass visualized. Endometrium Thickness: 8 mm.  Heterogeneous in echogenicity. Right ovary Not visualized. Left ovary Not visualized. Other findings No  abnormal free fluid. IMPRESSION: Endometrium is heterogeneous and measures 8 mm. In the setting of post-menopausal bleeding, endometrial sampling is indicated to exclude carcinoma. If results are benign, sonohysterogram should be considered for focal lesion work-up. (Ref: Radiological Reasoning: Algorithmic Workup of Abnormal Vaginal Bleeding with Endovaginal Sonography and Sonohysterography. AJR 2008; 808:D31-26) Electronically Signed   By: Bard Moats M.D.   On: 11/08/2023 10:55     Assessment and Plan:     1. Postmenopausal bleeding (Primary) Given persistent bleeding, patient does need evaluation with Hysteroscopy, Dilation and Curettage.  Patient was told that the objective of this is to rule out precancerous/cancerous etiologies and is diagnostic, does not treat the problem.  Risks of surgery were discussed with the patient including but not limited to: bleeding which may require transfusion; infection which may require antibiotics; injury to uterus or surrounding organs; need for additional procedures including laparotomy or laparoscopy; and other postoperative/anesthesia complications.  All questions answered.  She was told that she will be contacted by our surgical scheduler regarding the time and date of her surgery; routine preoperative instructions will be given to her by the preoperative nursing team.   Printed patient education handouts about the procedure were given to the patient to review at home. - Ambulatory Referral For Surgery Scheduling  Please refer to After Visit Summary for other counseling recommendations.   Return for follow up as recommended.    I spent 50 minutes dedicated to the care of this patient including pre-visit review of records, face to face time with the patient discussing her conditions and treatments, post visit ordering of medications and appropriate tests or procedures, coordinating care and documenting this visit encounter.    GLORIS HUGGER, MD,  FACOG Obstetrician & Gynecologist, [facility] for [employer], [facility]

## 2024-01-27 ENCOUNTER — Telehealth: Payer: Self-pay | Admitting: *Deleted

## 2024-01-27 NOTE — Telephone Encounter (Unsigned)
 Copied from CRM [id number]. Topic: General - Other >> Jan 27, 2024  2:15 PM Mercedes [name] wrote: Reason for CRM: Pt called in to inform Dr Cleatus that the swelling and hives she was experiencing has all subsided. She states she is doing well

## 2024-01-27 NOTE — Telephone Encounter (Signed)
 Copied from CRM [id number]. Topic: Clinical - Medical Advice >> Jan 27, 2024  2:19 PM Corin V wrote: Reason for CRM: FYI- Patient finished her second round of prednisone  for swelling and hives in the ankle. She finished on Wednesday and symptoms went away and have stayed away.

## 2024-01-27 NOTE — Telephone Encounter (Signed)
 Glad to hear. Please update us  as needed. Thanks.

## 2024-02-10 ENCOUNTER — Other Ambulatory Visit (HOSPITAL_COMMUNITY): Payer: Self-pay

## 2024-02-10 ENCOUNTER — Telehealth: Payer: Self-pay | Admitting: Family Medicine

## 2024-02-10 NOTE — Telephone Encounter (Signed)
 Copied from CRM #[id number]. Topic: Clinical - Medication Refill >> Feb 10, 2024  4:53 PM Delon T wrote: Medication: pravastatin  (PRAVACHOL ) 40 MG tablet levothyroxine  (SYNTHROID ) 200 MCG tablet   Has the patient contacted their pharmacy? No (Agent: If no, request that the patient contact the pharmacy for the refill. If patient does not wish to contact the pharmacy document the reason why and proceed with request.) (Agent: If yes, when and what did the pharmacy advise?)  This is the patient's preferred pharmacy:  [facility]  Is this the correct pharmacy for this prescription? Yes If no, delete pharmacy and type the correct one.   Has the prescription been filled recently? Yes  Is the patient out of the medication? No  Has the patient been seen for an appointment in the last year OR does the patient have an upcoming appointment? Yes  Can we respond through MyChart? Yes  Agent: Please be advised that Rx refills may take up to 3 business days. We ask that you follow-up with your pharmacy.

## 2024-02-11 MED ORDER — LEVOTHYROXINE SODIUM 200 MCG PO TABS: 200.0000 ug | ORAL_TABLET | Freq: Every day | ORAL | 0 refills | Status: DC

## 2024-02-11 MED ORDER — PRAVASTATIN SODIUM 40 MG PO TABS: 40.0000 mg | ORAL_TABLET | Freq: Every day | ORAL | 0 refills | Status: DC

## 2024-02-12 ENCOUNTER — Telehealth: Payer: Self-pay

## 2024-02-12 ENCOUNTER — Other Ambulatory Visit (HOSPITAL_COMMUNITY): Payer: Self-pay

## 2024-02-12 ENCOUNTER — Other Ambulatory Visit: Payer: Self-pay

## 2024-02-12 MED ORDER — PRAVASTATIN SODIUM 40 MG PO TABS
40.0000 mg | ORAL_TABLET | Freq: Every day | ORAL | 0 refills | Status: DC
  Filled 2024-02-12 – 2024-02-13 (×3): qty 90, 90d supply, fill #0
  Filled ????-??-?? (×2): fill #0

## 2024-02-12 MED ORDER — LEVOTHYROXINE SODIUM 200 MCG PO TABS
200.0000 ug | ORAL_TABLET | Freq: Every day | ORAL | 0 refills | Status: DC
  Filled 2024-02-12 – 2024-02-13 (×3): qty 90, 90d supply, fill #0
  Filled ????-??-?? (×2): fill #0

## 2024-02-12 NOTE — Telephone Encounter (Signed)
 Copied from CRM [id number]. Topic: Clinical - Prescription Issue >> Feb 12, 2024  9:43 AM Drema MATSU wrote: Reason for CRM: Patient is calling to check on prescription. She needs pravastatin  (PRAVACHOL ) 40 MG tablet and levothyroxine  (SYNTHROID ) 200 MCG tablet sent by [facility] home delivery. Patient isn't going to pick up the ones that are ready at [facility].

## 2024-02-12 NOTE — Telephone Encounter (Signed)
 Patient notified

## 2024-02-13 ENCOUNTER — Other Ambulatory Visit: Payer: Self-pay

## 2024-02-13 ENCOUNTER — Other Ambulatory Visit (HOSPITAL_COMMUNITY): Payer: Self-pay

## 2024-02-13 ENCOUNTER — Telehealth: Payer: Self-pay

## 2024-02-13 NOTE — Telephone Encounter (Signed)
 I called the patient to see if she's available for surgery on 03/24/24 w/ Dr. Herchel at 2:00 pm at [facility]. Patient agreed to surgery and was provided pre-op instructions and surgery details by phone.

## 2024-02-24 DIAGNOSIS — H353221 Exudative age-related macular degeneration, left eye, with active choroidal neovascularization: Secondary | ICD-10-CM | POA: Diagnosis not present

## 2024-02-24 DIAGNOSIS — H353211 Exudative age-related macular degeneration, right eye, with active choroidal neovascularization: Secondary | ICD-10-CM | POA: Diagnosis not present

## 2024-02-26 ENCOUNTER — Ambulatory Visit: Payer: PPO | Admitting: Dermatology

## 2024-03-05 ENCOUNTER — Encounter: Payer: Self-pay | Admitting: Family Medicine

## 2024-03-09 ENCOUNTER — Encounter: Payer: Self-pay | Admitting: Dermatology

## 2024-03-09 ENCOUNTER — Ambulatory Visit (INDEPENDENT_AMBULATORY_CARE_PROVIDER_SITE_OTHER): Admitting: Dermatology

## 2024-03-09 DIAGNOSIS — L814 Other melanin hyperpigmentation: Secondary | ICD-10-CM | POA: Diagnosis not present

## 2024-03-09 DIAGNOSIS — D229 Melanocytic nevi, unspecified: Secondary | ICD-10-CM

## 2024-03-09 DIAGNOSIS — D2239 Melanocytic nevi of other parts of face: Secondary | ICD-10-CM | POA: Diagnosis not present

## 2024-03-09 DIAGNOSIS — L918 Other hypertrophic disorders of the skin: Secondary | ICD-10-CM

## 2024-03-09 DIAGNOSIS — L578 Other skin changes due to chronic exposure to nonionizing radiation: Secondary | ICD-10-CM

## 2024-03-09 DIAGNOSIS — L821 Other seborrheic keratosis: Secondary | ICD-10-CM | POA: Diagnosis not present

## 2024-03-09 DIAGNOSIS — Z1283 Encounter for screening for malignant neoplasm of skin: Secondary | ICD-10-CM | POA: Diagnosis not present

## 2024-03-09 DIAGNOSIS — D1721 Benign lipomatous neoplasm of skin and subcutaneous tissue of right arm: Secondary | ICD-10-CM

## 2024-03-09 DIAGNOSIS — D1739 Benign lipomatous neoplasm of skin and subcutaneous tissue of other sites: Secondary | ICD-10-CM | POA: Diagnosis not present

## 2024-03-09 DIAGNOSIS — W908XXA Exposure to other nonionizing radiation, initial encounter: Secondary | ICD-10-CM

## 2024-03-09 DIAGNOSIS — Z7189 Other specified counseling: Secondary | ICD-10-CM

## 2024-03-09 NOTE — Patient Instructions (Signed)

## 2024-03-09 NOTE — Progress Notes (Signed)
   Follow-Up Visit   Subjective  Mercedes [name] is a 78 y.o. female who presents for the following: Skin Cancer Screening and Full Body Skin Exam  The patient presents for Total-Body Skin Exam (TBSE) for skin cancer screening and mole check. The patient has spots, moles and lesions to be evaluated, some may be new or changing and the patient may have concern these could be cancer.  The following portions of the chart were reviewed this encounter and updated as appropriate: medications, allergies, medical history  Review of Systems:  No other skin or systemic complaints except as noted in HPI or Assessment and Plan.  Objective  Well appearing patient in no apparent distress; mood and affect are within normal limits.  A full examination was performed including scalp, head, eyes, ears, nose, lips, neck, chest, axillae, abdomen, back, buttocks, bilateral upper extremities, bilateral lower extremities, hands, feet, fingers, toes, fingernails, and toenails. All findings within normal limits unless otherwise noted below.   Relevant physical exam findings are noted in the Assessment and Plan.    Assessment & Plan   SKIN CANCER SCREENING PERFORMED TODAY.  ACTINIC DAMAGE - Chronic condition, secondary to cumulative UV/sun exposure - diffuse scaly erythematous macules with underlying dyspigmentation - Recommend daily broad spectrum sunscreen SPF 30+ to sun-exposed areas, reapply every 2 hours as needed.  - Staying in the shade or wearing long sleeves, sun glasses (UVA+UVB protection) and wide brim hats (4-inch brim around the entire circumference of the hat) are also recommended for sun protection.  - Call for new or changing lesions.  LENTIGINES, SEBORRHEIC KERATOSES, HEMANGIOMAS - Benign normal skin lesions - Benign-appearing - Call for any changes  MELANOCYTIC NEVI - Tan-brown and/or pink-flesh-colored symmetric macules and papules - Benign appearing on exam today - Observation -  Call clinic for new or changing moles - Recommend daily use of broad spectrum spf 30+ sunscreen to sun-exposed areas.   MELANOCYTIC NEVUS Exam: Flat flesh colored papule of the L cheek, pt reports no change, states bx by [facility] many years and was benign. Treatment Plan: Benign appearing on exam today. Recommend observation. Call clinic for new or changing moles. Recommend daily use of broad spectrum spf 30+ sunscreen to sun-exposed areas.    Lipoma  Exam: Subcutaneous rubbery nodule(s) 5.0 cm Location: R forearm Benign-appearing. Exam most consistent with an Lipoma. Discussed that a Lipoma is a benign fatty growth that can grow over time and sometimes become painful or otherwise symptomatic. Some patients may have one or several lipomas.. Benign Hereditary Lipomatosis is a hereditary familial condition where family members tend to grow multiple lipomas.  Recommend observation if it is not changing, growing or symptomatic. Recommend surgical excision to remove it if it is painful, growing, symptomatic, or other changes noted. Please contact our office for new or changing lesions so they can be evaluated.  Acrochordons (Skin Tags) - Fleshy, skin-colored pedunculated papules axillary area - Benign appearing.  - Observe. - If desired, they can be removed with an in office procedure that is not covered by insurance. - Please call the clinic if you notice any new or changing lesion   Return for TBSE in 1-2 years.  LILLETTE Rosina Mayans, CMA, am acting as scribe for Alm Rhyme, MD .   Documentation: I have reviewed the above documentation for accuracy and completeness, and I agree with the above.  Alm Rhyme, MD

## 2024-03-13 ENCOUNTER — Telehealth: Payer: Self-pay | Admitting: Family Medicine

## 2024-03-13 NOTE — Telephone Encounter (Signed)
 Please let me know about the message- I can't see it on the note. Thanks.

## 2024-03-13 NOTE — Telephone Encounter (Signed)
 I called patient says she received a letter from about her refferal from the gastrenology and she would like to receive a call back from you about that refferal.

## 2024-03-13 NOTE — Telephone Encounter (Signed)
 FYI  I returned call to patient in reference to a referral that was placed for GI. She states that she does not recall discussing the referral. I did advise that around that time it is showing that she would be do a colonoscopy based upon the care gaps. She said that she will wait to speak with about it when she comes in October.   No call back needed.

## 2024-03-15 NOTE — Telephone Encounter (Signed)
 It looks like this was put in as a routine follow up order.  Thanks.  I can talk to patient about it  at follow up.

## 2024-03-17 ENCOUNTER — Encounter (HOSPITAL_COMMUNITY): Payer: Self-pay | Admitting: Obstetrics & Gynecology

## 2024-03-17 NOTE — Progress Notes (Signed)
 Spoke w/ via phone for pre-op interview--- Mercedes [name] needs dos---- BMP and EKG per anesthesia.        [name] results------ COVID test -----patient states asymptomatic no test needed Arrive at -------1215 NPO after MN NO Solid Food.  Clear liquids from MN until---1115 Pre-Surgery Ensure or G2:  Med rec completed Medications to take morning of surgery ----- Levothyroxine  and Metoprolol  Diabetic medication -----  GLP1 agonist last dose: GLP1 instructions:  Patient instructed no nail polish to be worn day of surgery Patient instructed to bring photo id and insurance card day of surgery Patient aware to have Driver (ride ) / caregiver    for 24 hours after surgery - Son Mercedes [name] Patient Special Instructions ----- Pre-Op special Instructions -----  Patient verbalized understanding of instructions that were given at this phone interview. Patient denies chest pain, sob, fever, cough at the interview.

## 2024-03-24 ENCOUNTER — Encounter (HOSPITAL_COMMUNITY)
Admission: RE | Disposition: A | Discharge status: Home / Self Care | Payer: Self-pay | Source: Home / Self Care | Attending: Obstetrics & Gynecology

## 2024-03-24 ENCOUNTER — Other Ambulatory Visit (HOSPITAL_COMMUNITY): Payer: Self-pay

## 2024-03-24 ENCOUNTER — Telehealth: Payer: Self-pay | Admitting: Obstetrics & Gynecology

## 2024-03-24 ENCOUNTER — Ambulatory Visit (HOSPITAL_COMMUNITY): Admitting: Anesthesiology

## 2024-03-24 ENCOUNTER — Encounter (HOSPITAL_COMMUNITY): Payer: Self-pay | Admitting: Obstetrics & Gynecology

## 2024-03-24 ENCOUNTER — Ambulatory Visit (HOSPITAL_COMMUNITY)
Admission: RE | Admit: 2024-03-24 | Discharge: 2024-03-24 | Disposition: A | Discharge status: Home / Self Care | Attending: Obstetrics & Gynecology | Admitting: Obstetrics & Gynecology

## 2024-03-24 DIAGNOSIS — N950 Postmenopausal bleeding: Secondary | ICD-10-CM | POA: Diagnosis not present

## 2024-03-24 DIAGNOSIS — N840 Polyp of corpus uteri: Secondary | ICD-10-CM | POA: Diagnosis not present

## 2024-03-24 DIAGNOSIS — E039 Hypothyroidism, unspecified: Secondary | ICD-10-CM | POA: Diagnosis not present

## 2024-03-24 DIAGNOSIS — I10 Essential (primary) hypertension: Secondary | ICD-10-CM | POA: Insufficient documentation

## 2024-03-24 DIAGNOSIS — Z87891 Personal history of nicotine dependence: Secondary | ICD-10-CM | POA: Diagnosis not present

## 2024-03-24 HISTORY — PX: DILATATION & CURRETTAGE/HYSTEROSCOPY WITH RESECTOCOPE: SHX5572

## 2024-03-24 HISTORY — PX: MYOSURE RESECTION: SHX7611

## 2024-03-24 LAB — CBC
HCT: 47.5 % — ABNORMAL HIGH (ref 36.0–46.0)
Hemoglobin: 16.3 g/dL — ABNORMAL HIGH (ref 12.0–15.0)
MCH: 33.6 pg (ref 26.0–34.0)
MCHC: 34.3 g/dL (ref 30.0–36.0)
MCV: 97.9 fL (ref 80.0–100.0)
Platelets: 171 K/uL (ref 150–400)
RBC: 4.85 MIL/uL (ref 3.87–5.11)
RDW: 13.1 % (ref 11.5–15.5)
WBC: 7.8 K/uL (ref 4.0–10.5)
nRBC: 0 % (ref 0.0–0.2)

## 2024-03-24 LAB — BASIC METABOLIC PANEL WITH GFR
Anion gap: 12 (ref 5–15)
BUN: 17 mg/dL (ref 8–23)
CO2: 24 mmol/L (ref 22–32)
Calcium: 9.6 mg/dL (ref 8.9–10.3)
Chloride: 105 mmol/L (ref 98–111)
Creatinine, Ser: 0.72 mg/dL (ref 0.44–1.00)
GFR, Estimated: >60 mL/min (ref >60)
Glucose, Bld: 109 mg/dL — ABNORMAL HIGH (ref 70–99)
Potassium: 4.3 mmol/L (ref 3.5–5.1)
Sodium: 141 mmol/L (ref 135–145)

## 2024-03-24 SURGERY — DILATATION & CURETTAGE/HYSTEROSCOPY WITH RESECTOCOPE
Anesthesia: General

## 2024-03-24 MED ORDER — FENTANYL CITRATE (PF) 250 MCG/5ML IJ SOLN
INTRAMUSCULAR | Status: AC
  Filled 2024-03-24: qty 5

## 2024-03-24 MED ORDER — FENTANYL CITRATE (PF) 250 MCG/5ML IJ SOLN
INTRAMUSCULAR | Status: DC | PRN
  Administered 2024-03-24: 100 ug via INTRAVENOUS

## 2024-03-24 MED ORDER — KETOROLAC TROMETHAMINE 15 MG/ML IJ SOLN
INTRAMUSCULAR | Status: DC | PRN
  Administered 2024-03-24: 15 mg via INTRAVENOUS

## 2024-03-24 MED ORDER — OXYCODONE HCL 5 MG PO TABS
5.0000 mg | ORAL_TABLET | Freq: Four times a day (QID) | ORAL | 0 refills | Status: DC | PRN
  Filled 2024-03-24: qty 10, 3d supply, fill #0

## 2024-03-24 MED ORDER — LIDOCAINE 2% (20 MG/ML) 5 ML SYRINGE
INTRAMUSCULAR | Status: DC | PRN
  Administered 2024-03-24: 100 mg via INTRAVENOUS

## 2024-03-24 MED ORDER — SODIUM CHLORIDE 0.9 % IR SOLN
Status: DC | PRN
  Administered 2024-03-24: 3000 mL

## 2024-03-24 MED ORDER — LACTATED RINGERS IV SOLN: INTRAVENOUS | Status: DC

## 2024-03-24 MED ORDER — PROPOFOL 10 MG/ML IV BOLUS
INTRAVENOUS | Status: DC | PRN
  Administered 2024-03-24: 200 mg via INTRAVENOUS

## 2024-03-24 MED ORDER — DEXAMETHASONE SODIUM PHOSPHATE 10 MG/ML IJ SOLN
INTRAMUSCULAR | Status: DC | PRN
  Administered 2024-03-24: 5 mg via INTRAVENOUS

## 2024-03-24 MED ORDER — DEXAMETHASONE SODIUM PHOSPHATE 10 MG/ML IJ SOLN
INTRAMUSCULAR | Status: AC
  Filled 2024-03-24: qty 1

## 2024-03-24 MED ORDER — ACETAMINOPHEN 500 MG PO TABS
ORAL_TABLET | ORAL | Status: AC
  Filled 2024-03-24: qty 2

## 2024-03-24 MED ORDER — ACETAMINOPHEN 500 MG PO TABS: 1000.0000 mg | ORAL_TABLET | Freq: Four times a day (QID) | ORAL | Status: AC | PRN

## 2024-03-24 MED ORDER — KETOROLAC TROMETHAMINE 30 MG/ML IJ SOLN
INTRAMUSCULAR | Status: AC
  Filled 2024-03-24: qty 1

## 2024-03-24 MED ORDER — BUPIVACAINE HCL 0.5 % IJ SOLN
INTRAMUSCULAR | Status: DC | PRN
  Administered 2024-03-24: 10 mL

## 2024-03-24 MED ORDER — CHLORHEXIDINE GLUCONATE 0.12 % MT SOLN
15.0000 mL | Freq: Once | OROMUCOSAL | Status: AC
  Administered 2024-03-24: 15 mL via OROMUCOSAL

## 2024-03-24 MED ORDER — PHENYLEPHRINE 80 MCG/ML (10ML) SYRINGE FOR IV PUSH (FOR BLOOD PRESSURE SUPPORT)
PREFILLED_SYRINGE | INTRAVENOUS | Status: DC | PRN
  Administered 2024-03-24 (×3): 160 ug via INTRAVENOUS

## 2024-03-24 MED ORDER — ORAL CARE MOUTH RINSE: 15.0000 mL | Freq: Once | OROMUCOSAL | Status: AC

## 2024-03-24 MED ORDER — CHLORHEXIDINE GLUCONATE 0.12 % MT SOLN
OROMUCOSAL | Status: AC
  Filled 2024-03-24: qty 15

## 2024-03-24 MED ORDER — ONDANSETRON HCL 4 MG/2ML IJ SOLN
INTRAMUSCULAR | Status: AC
  Filled 2024-03-24: qty 2

## 2024-03-24 MED ORDER — IBUPROFEN 600 MG PO TABS: 600.0000 mg | ORAL_TABLET | Freq: Three times a day (TID) | ORAL | Status: AC | PRN

## 2024-03-24 MED ORDER — DEXMEDETOMIDINE HCL IN NACL 80 MCG/20ML IV SOLN
INTRAVENOUS | Status: DC | PRN
  Administered 2024-03-24: 4 ug via INTRAVENOUS
  Administered 2024-03-24: 8 ug via INTRAVENOUS

## 2024-03-24 MED ORDER — ACETAMINOPHEN 500 MG PO TABS
1000.0000 mg | ORAL_TABLET | ORAL | Status: AC
  Administered 2024-03-24: 1000 mg via ORAL

## 2024-03-24 MED ORDER — ONDANSETRON HCL 4 MG/2ML IJ SOLN
INTRAMUSCULAR | Status: DC | PRN
  Administered 2024-03-24: 4 mg via INTRAVENOUS

## 2024-03-24 SURGICAL SUPPLY — 16 items
CATH ROBINSON RED A/P 16FR (CATHETERS) ×2 IMPLANT
CNTNR URN SCR LID CUP LEK RST (MISCELLANEOUS) IMPLANT
CURETTE PIPELLE ENDOMTRL SUCTN (MISCELLANEOUS) IMPLANT
DEVICE MYOSURE LITE (MISCELLANEOUS) IMPLANT
DEVICE MYOSURE REACH (MISCELLANEOUS) IMPLANT
GLOVE ECLIPSE 7.0 STRL STRAW (GLOVE) ×2 IMPLANT
GLOVE SURG UNDER POLY LF SZ7 (GLOVE) ×4 IMPLANT
GOWN STRL REUS W/ TWL XL LVL3 (GOWN DISPOSABLE) ×2 IMPLANT
KIT PROCEDURE FLUENT (KITS) ×2 IMPLANT
KIT TURNOVER KIT B (KITS) ×2 IMPLANT
PACK VAGINAL MINOR WOMEN LF (CUSTOM PROCEDURE TRAY) ×2 IMPLANT
PAD OB MATERNITY 11 LF (PERSONAL CARE ITEMS) ×2 IMPLANT
SEAL ROD LENS SCOPE MYOSURE (ABLATOR) ×2 IMPLANT
SOLUTION ANTFG W/FOAM PAD STRL (MISCELLANEOUS) IMPLANT
TOWEL GREEN STERILE FF (TOWEL DISPOSABLE) ×2 IMPLANT
UNDERPAD 30X36 HEAVY ABSORB (UNDERPADS AND DIAPERS) ×2 IMPLANT

## 2024-03-24 NOTE — Telephone Encounter (Signed)
 Contacted patient to schedule post op appt- patients son stated she was currently in surgery front desk would call back at later time to schedule appt.

## 2024-03-24 NOTE — H&P (Signed)
 Preoperative History and Physical  Mercedes [name] is a 78 y.o. G2P0 here for surgical evaluation of postmenopausal bleeding.  She notices pink discharge when she wipes, this occurs about 1-2 times a week over the last few months. Ultrasound in May 2025 showed 8 mm heterogenous endometrium. She underwent an endometrial biopsy on 12/06/23 by her Urogynecology team (she sees them for urinary issues), this showed atrophic endometrium, no atypia/EIN. She has continued to have the pink discharge so Hysteroscopy was recommended.  No significant preoperative concerns.  Proposed surgery: Hysteroscopy, Dilation and Curettage.   Past Medical History:  Diagnosis Date   Allergy    Cataract    bilateral-not an issue right now   Diverticulosis of colon    Fatty liver    Gout    HLD (hyperlipidemia)    under control   HTN (hypertension)    Hypothyroidism    Macular degeneration    Osteoarthritis    Rapid heartbeat    occasional, does not last long   Tobacco abuse    Past Surgical History:  Procedure Laterality Date   CATARACT EXTRACTION     CHOLECYSTECTOMY N/A 03/17/2014   Procedure: LAPAROSCOPIC CHOLECYSTECTOMY WITH INTRAOPERATIVE CHOLANGIOGRAM ;  Surgeon: Elon Pacini, MD;  Location: [facility];  Service: General;  Laterality: N/A;   COLONOSCOPY     removed polyps   lower back surgery      PARATHYROIDECTOMY Left 08/20/2022   Procedure: LEFT PARATHYROIDECTOMY;  Surgeon: Eletha Boas, MD;  Location: [facility];  Service: General;  Laterality: Left;   SIGMOIDOSCOPY     OB History  Gravida Para Term Preterm AB Living  2     2  SAB IAB Ectopic Multiple Live Births      2    # Outcome Date GA Lbr Len/2nd Weight Sex Type Anes PTL Lv  2 Gravida           1 Gravida           Patient denies any other pertinent gynecologic issues.   No current facility-administered medications on file prior to encounter.   Current Outpatient Medications on File Prior to Encounter  Medication Sig Dispense  Refill   Aflibercept  (EYLEA ) 2 MG/0.05ML SOLN Injected in R eye.     ascorbic acid (VITAMIN C) 500 MG tablet Take 500 mg by mouth daily.     cholecalciferol (VITAMIN D ) 1000 units tablet Take 1,000 Units by mouth 2 (two) times a day.      CRANBERRY PO Take 1 capsule by mouth in the morning and at bedtime.     faricimab -svoa (VABYSMO ) 6 MG/0.05ML SOLN intravitreal injection 0.05 mLs (6 mg total) by Intravitreal route as directed. (Patient taking differently: 6 mg by Intravitreal route as directed. Every 6 weeks) 0.05 mL 0   levothyroxine  (SYNTHROID ) 200 MCG tablet Take 1 tablet (200 mcg total) by mouth daily before breakfast. 90 tablet 0   levothyroxine  (SYNTHROID ) 50 MCG tablet Take 1 tablet (50 mcg total) by mouth daily before breakfast. Take along with 200 mcg tablet. 90 tablet 3   metoprolol  (TOPROL -XL) 200 MG 24 hr tablet Take 1 tablet (200 mg total) by mouth every morning. 90 tablet 0   Multiple Vitamin (MULTIVITAMIN WITH MINERALS) TABS tablet Take 1 tablet by mouth daily.     Multiple Vitamins-Minerals (PRESERVISION AREDS 2) CAPS Take 1 capsule by mouth 2 (two) times daily.     NON FORMULARY D-MANOOSE     Omega 3 1000 MG CAPS Take  1,000 mg by mouth in the morning and at bedtime.     Potassium 99 MG TABS Take 99 mg by mouth in the morning and at bedtime.     pravastatin  (PRAVACHOL ) 40 MG tablet Take 1 tablet (40 mg total) by mouth daily. 90 tablet 0   psyllium (REGULOID) 0.52 G capsule Take 1.04 g by mouth daily.     TURMERIC PO Take 750 mg by mouth daily.     zinc  gluconate 50 MG tablet Take 50 mg by mouth daily.     colchicine  0.6 MG tablet Take 1 tablet by mouth twice daily as needed 60 tablet 0   Allergies  Allergen Reactions   Ace Inhibitors Cough   Amlodipine      Edema at 5mg  dose.     Clarithromycin     REACTION: GI upset   Fenofibrate Itching    REACTION: itching- pt unsure   Lipitor [Atorvastatin ]     myalgias   Niacin     burning, made pt pass out   Penicillins      REACTION: Purpura   Sulfasalazine     REACTION: Purpura   Tetracycline Hcl     REACTION: GI upset    Social History:   reports that she quit smoking about 13 years ago. Her smoking use included cigarettes. She started smoking about 28 years ago. She has a 3.8 pack-year smoking history. She has never used smokeless tobacco. She reports current alcohol use. She reports that she does not use drugs.  Family History  Problem Relation Age of Onset   Angina Mother    Ulcers Father    Hypertension Brother    Prostate cancer Brother    Colon cancer Neg Hx    Breast cancer Neg Hx    Esophageal cancer Neg Hx    Stomach cancer Neg Hx    Rectal cancer Neg Hx    Uterine cancer Neg Hx    Bladder Cancer Neg Hx     Review of Systems: Pertinent items noted in HPI and remainder of comprehensive ROS otherwise negative.  PHYSICAL EXAM: Blood pressure (!) 176/71, pulse 77, temperature 98.3 F (36.8 C), resp. rate 17, height 5' 4 (1.626 m), weight 100.7 kg, SpO2 98%. CONSTITUTIONAL: Well-developed, well-nourished female in no acute distress.  HENT:  Normocephalic, atraumatic, External right and left ear normal. Oropharynx is clear and moist EYES: Conjunctivae and EOM are normal. Pupils are equal, round, and reactive to light. No scleral icterus.  NECK: Normal range of motion, supple, no masses SKIN: Skin is warm and dry. No rash noted. Not diaphoretic. No erythema. No pallor. NEUROLOGIC: Alert and oriented to person, place, and time. Normal reflexes, muscle tone coordination. No cranial nerve deficit noted. PSYCHIATRIC: Normal mood and affect. Normal behavior. Normal judgment and thought content. CARDIOVASCULAR: Normal heart rate noted, regular rhythm RESPIRATORY: Effort and breath sounds normal, no problems with respiration noted ABDOMEN: Soft, nontender, nondistended. PELVIC: Deferred MUSCULOSKELETAL: Normal range of motion. No edema and no tenderness. 2+ distal pulses.  Studies: Results for  orders placed or performed during the hospital encounter of 03/24/24 (from the past 2 weeks)  CBC   Collection Time: 03/24/24  1:07 PM  Result Value Ref Range   WBC 7.8 4.0 - 10.5 K/uL   RBC 4.85 3.87 - 5.11 MIL/uL   Hemoglobin 16.3 (H) 12.0 - 15.0 g/dL   HCT 52.4 (H) 63.9 - 53.9 %   MCV 97.9 80.0 - 100.0 fL   MCH 33.6 26.0 -  34.0 pg   MCHC 34.3 30.0 - 36.0 g/dL   RDW 86.8 88.4 - 84.4 %   Platelets 171 150 - 400 K/uL   nRBC 0.0 0.0 - 0.2 %    12/06/2023 ENDOMETRIUM, BIOPSY:  - Predominantly mucus and inflammatory material.  - Few strips of atrophic endometrial glandular epithelium.  - Few strips of endocervical glandular epithelium with focal squamous metaplasia.  - Negative for atypia/EIN and malignancy in this limited sample.  - Deeper sections were examined    US  PELVIC COMPLETE WITH TRANSVAGINAL Result Date: 11/08/2023 CLINICAL DATA:  Postmenopausal bleeding EXAM: TRANSABDOMINAL AND TRANSVAGINAL ULTRASOUND OF PELVIS TECHNIQUE: Both transabdominal and transvaginal ultrasound examinations of the pelvis were performed. Transabdominal technique was performed for global imaging of the pelvis including uterus, ovaries, adnexal regions, and pelvic cul-de-sac. It was necessary to proceed with endovaginal exam following the transabdominal exam to visualize the endometrium. COMPARISON:  None Available. FINDINGS: Uterus Measurements: 6.1 x 3.0 x 3.8 cm = volume: 36.1 mL. No fibroids or other mass visualized. Endometrium Thickness: 8 mm.  Heterogeneous in echogenicity. Right ovary Not visualized. Left ovary Not visualized. Other findings No abnormal free fluid. IMPRESSION: Endometrium is heterogeneous and measures 8 mm. In the setting of post-menopausal bleeding, endometrial sampling is indicated to exclude carcinoma. If results are benign, sonohysterogram should be considered for focal lesion work-up. (Ref: Radiological Reasoning: Algorithmic Workup of Abnormal Vaginal Bleeding with Endovaginal  Sonography and Sonohysterography. AJR 2008; 808:D31-26) Electronically Signed   By: Bard Moats M.D.   On: 11/08/2023 10:55   Assessment: Principal Problem:   Postmenopausal bleeding   Plan: Patient will undergo surgical management with Hysteroscopy, Dilation and Curettage.  The risks of surgery were discussed in detail with the patient including but not limited to: bleeding which may require transfusion or reoperation; infection which may require antibiotics; injury to surrounding organs which may involve uterus, bowel, bladder, ureters; need for additional procedures including laparoscopy or laparotomy or subsequent procedures secondary to abnormal pathology; thromboembolic phenomenon, surgical site problems and other postoperative/anesthesia complications. Likelihood of success in alleviating the patient's condition was discussed. Routine postoperative instructions will be reviewed with the patient and her family in detail after surgery.  The patient concurred with the proposed plan, giving informed written consent for the surgery.  Patient has been NPO since last night and she will remain NPO for procedure.  Anesthesia and OR aware.  Preoperative prophylactic SCDs ordered on call to the OR.  To OR when ready.    GLORIS HUGGER, MD, FACOG Obstetrician & Gynecologist, [facility] for [employer], [facility]

## 2024-03-24 NOTE — Anesthesia Procedure Notes (Signed)
 Procedure Name: LMA Insertion Date/Time: 03/24/2024 3:12 PM  Performed by: Hedy Jarred, CRNAPre-anesthesia Checklist: Patient identified, Emergency Drugs available, Suction available and Patient being monitored Patient Re-evaluated:Patient Re-evaluated prior to induction Oxygen Delivery Method: Circle System Utilized Preoxygenation: Pre-oxygenation with 100% oxygen Induction Type: IV induction Ventilation: Mask ventilation without difficulty LMA Size: 4.0 Number of attempts: 1 Airway Equipment and Method: Bite block Placement Confirmation: positive ETCO2 Tube secured with: Tape Dental Injury: Teeth and Oropharynx as per pre-operative assessment

## 2024-03-24 NOTE — Transfer of Care (Signed)
 Immediate Anesthesia Transfer of Care Note  Patient: Mercedes [name]  Procedure(s) Performed: DILATATION & CURETTAGE/HYSTEROSCOPY WITH RESECTOCOPE MYOSURE RESECTION  Patient Location: PACU  Anesthesia Type:General  Level of Consciousness: awake, alert , and oriented  Airway & Oxygen Therapy: Patient Spontanous Breathing  Post-op Assessment: Report given to RN and Post -op Vital signs reviewed and stable  Post vital signs: Reviewed and stable  Last Vitals:  Vitals Value Taken Time  BP 158/65 03/24/24 16:30  Temp 36.9 C 03/24/24 16:17  Pulse 89 03/24/24 16:36  Resp 18 03/24/24 16:36  SpO2 96 % 03/24/24 16:36  Vitals shown include unfiled device data.  Last Pain:  Vitals:   03/24/24 1617  PainSc: 0-No pain      Patients Stated Pain Goal: 5 (03/24/24 1246)  Complications: No notable events documented.

## 2024-03-24 NOTE — Anesthesia Preprocedure Evaluation (Addendum)
 Anesthesia Evaluation  Patient identified by MRN, date of birth, ID band Patient awake    Reviewed: Allergy & Precautions, H&P , NPO status , Patient's Chart, lab work & pertinent test results, reviewed documented beta blocker date and time   History of Anesthesia Complications Negative for: history of anesthetic complications  Airway Mallampati: II  TM Distance: >3 FB Neck ROM: Full    Dental no notable dental hx.    Pulmonary neg pulmonary ROS, former smoker   Pulmonary exam normal        Cardiovascular hypertension, Pt. on medications and Pt. on home beta blockers negative cardio ROS Normal cardiovascular exam     Neuro/Psych negative neurological ROS  negative psych ROS   GI/Hepatic negative GI ROS, Neg liver ROS,,,  Endo/Other  negative endocrine ROSHypothyroidism  HYPERPARATHYROIDISM  Renal/GU negative Renal ROS  negative genitourinary   Musculoskeletal negative musculoskeletal ROS (+) Arthritis ,    Abdominal  (+) + obese  Peds negative pediatric ROS (+)  Hematology negative hematology ROS (+)   Anesthesia Other Findings Day of surgery medications reviewed with patient.  Reproductive/Obstetrics negative OB ROS                              Anesthesia Physical Anesthesia Plan  ASA: 2  Anesthesia Plan: General   Post-op Pain Management: Tylenol  PO (pre-op)*   Induction: Intravenous  PONV Risk Score and Plan: 3 and Treatment may vary due to age or medical condition, Dexamethasone , Ondansetron  and Midazolam   Airway Management Planned: LMA  Additional Equipment: None  Intra-op Plan:   Post-operative Plan: Extubation in OR  Informed Consent: I have reviewed the patients History and Physical, chart, labs and discussed the procedure including the risks, benefits and alternatives for the proposed anesthesia with the patient or authorized representative who has indicated  his/her understanding and acceptance.     Dental advisory given  Plan Discussed with: CRNA  Anesthesia Plan Comments:         Anesthesia Quick Evaluation

## 2024-03-24 NOTE — Anesthesia Postprocedure Evaluation (Signed)
 Anesthesia Post Note  Patient: Mercedes [name]  Procedure(s) Performed: DILATATION & CURETTAGE/HYSTEROSCOPY WITH RESECTOCOPE MYOSURE RESECTION     Patient location during evaluation: PACU Anesthesia Type: General Level of consciousness: awake and alert Pain management: pain level controlled Vital Signs Assessment: post-procedure vital signs reviewed and stable Respiratory status: spontaneous breathing, nonlabored ventilation and respiratory function stable Cardiovascular status: blood pressure returned to baseline and stable Postop Assessment: no apparent nausea or vomiting Anesthetic complications: no   No notable events documented.  Last Vitals:  Vitals:   03/24/24 1645 03/24/24 1650  BP: (!) 145/53 (!) 153/58  Pulse: 87 88  Resp: 20 19  Temp:  36.7 C  SpO2: 93% 94%    Last Pain:  Vitals:   03/24/24 1645  PainSc: 0-No pain                 Butler Levander Pinal

## 2024-03-24 NOTE — Op Note (Signed)
 PREOPERATIVE DIAGNOSIS:  Postmenopausal bleeding POSTOPERATIVE DIAGNOSIS: The same PROCEDURE: Hysteroscopy, Dilation and Curettage, Polypectomy SURGEON:  Dr. Gloris Hugger   INDICATIONS: 78 y.o. G2P0  here for scheduled surgery for the aforementioned diagnoses.   Risks of surgery were discussed with the patient including but not limited to: bleeding which may require transfusion; infection which may require antibiotics; injury to uterus or surrounding organs; intrauterine scarring which may impair future fertility; need for additional procedures including laparotomy or laparoscopy; and other postoperative/anesthesia complications. Written informed consent was obtained.    FINDINGS:  A 6 week size uterus.  Atrophic endometrium diffusely. 1 cm polypoid lesion noted in mid anterior fundus area, this was resected.  Normal ostia bilaterally.  ANESTHESIA:   General, paracervical block with 10 ml of 0.5% Marcaine  FLUID DEFICITS:  20ml of NS ESTIMATED BLOOD LOSS:  Less than 20 ml SPECIMENS: Endometrial curettings sent to pathology COMPLICATIONS:  None immediate.  PROCEDURE DETAILS:  The patient was then taken to the operating room where general anesthesia was administered and was found to be adequate.  After an adequate timeout was performed, she was placed in the dorsal lithotomy position and examined; then prepped and draped in the sterile manner.   Her bladder was catheterized for an unmeasured amount of clear, yellow urine. A speculum was then placed in the patient's vagina and a single tooth tenaculum was applied to the anterior lip of the cervix.   A paracervical block using 10 ml of 0.5% Marcaine  was administered.  The uterus was sounded to 6 cm and the cervix was dilated manually with metal dilators to accommodate the 5 mm diagnostic hysteroscope.  The hysteroscope was then inserted under direct visualization using NS as a suspension medium.  The uterine cavity was carefully examined with the  findings as noted above.    After further careful visualization of the uterine cavity, the Myosure REACH device was inserted and used to remove the polypoid lesion, also used to do guided curettage of the endometrium.  The hysteroscope equipment was then removed from the uterus. A sharp curettage was also then performed to obtain a small amount of endometrial curettings.  The tenaculum was removed from the anterior lip of the cervix and the vaginal speculum was removed after noting good hemostasis.  The patient tolerated the procedure well and was taken to the recovery area awake, extubated and in stable condition.  The patient will be discharged to home as per PACU criteria.  Routine postoperative instructions given.  She was prescribed Oxycodone  and given instructions on how to use home supply of Acetaminophen  and Ibuprofen .  She will follow up in the office in 2-3 weeks for postoperative evaluation.   GLORIS HUGGER, MD, FACOG Obstetrician & Gynecologist, [facility] for [employer], [facility]

## 2024-03-25 ENCOUNTER — Encounter (HOSPITAL_COMMUNITY): Payer: Self-pay | Admitting: Obstetrics & Gynecology

## 2024-03-26 LAB — SURGICAL PATHOLOGY

## 2024-03-27 ENCOUNTER — Ambulatory Visit: Payer: Self-pay | Admitting: Obstetrics & Gynecology

## 2024-03-31 ENCOUNTER — Other Ambulatory Visit (HOSPITAL_COMMUNITY): Payer: Self-pay

## 2024-04-01 ENCOUNTER — Other Ambulatory Visit: Payer: Self-pay

## 2024-04-01 ENCOUNTER — Other Ambulatory Visit: Payer: Self-pay | Admitting: Family Medicine

## 2024-04-01 ENCOUNTER — Other Ambulatory Visit (HOSPITAL_COMMUNITY): Payer: Self-pay

## 2024-04-01 MED ORDER — METOPROLOL SUCCINATE ER 200 MG PO TB24
200.0000 mg | ORAL_TABLET | Freq: Every morning | ORAL | 1 refills | Status: AC
  Filled 2024-04-01: qty 90, 90d supply, fill #0
  Filled 2024-07-02: qty 90, 90d supply, fill #1

## 2024-04-07 DIAGNOSIS — Z6837 Body mass index (BMI) 37.0-37.9, adult: Secondary | ICD-10-CM | POA: Diagnosis not present

## 2024-04-07 DIAGNOSIS — M4316 Spondylolisthesis, lumbar region: Secondary | ICD-10-CM | POA: Diagnosis not present

## 2024-04-08 ENCOUNTER — Ambulatory Visit (INDEPENDENT_AMBULATORY_CARE_PROVIDER_SITE_OTHER)

## 2024-04-08 ENCOUNTER — Other Ambulatory Visit: Payer: Self-pay | Admitting: Family Medicine

## 2024-04-08 DIAGNOSIS — Z23 Encounter for immunization: Secondary | ICD-10-CM | POA: Diagnosis not present

## 2024-04-08 NOTE — Progress Notes (Signed)
 Per orders of Dr. Laine Balls, injection of HD flu vaccine given by Amellia Panik in left deltoid. Patient tolerated injection well.

## 2024-04-09 ENCOUNTER — Other Ambulatory Visit (HOSPITAL_COMMUNITY): Payer: Self-pay

## 2024-04-09 ENCOUNTER — Ambulatory Visit: Admitting: Obstetrics & Gynecology

## 2024-04-09 ENCOUNTER — Encounter: Payer: Self-pay | Admitting: Physician Assistant

## 2024-04-09 ENCOUNTER — Encounter: Payer: Self-pay | Admitting: Obstetrics & Gynecology

## 2024-04-09 VITALS — BP 151/74 | HR 72 | Ht 64.0 in | Wt 226.0 lb

## 2024-04-09 DIAGNOSIS — Z09 Encounter for follow-up examination after completed treatment for conditions other than malignant neoplasm: Secondary | ICD-10-CM | POA: Diagnosis not present

## 2024-04-09 DIAGNOSIS — N950 Postmenopausal bleeding: Secondary | ICD-10-CM | POA: Diagnosis not present

## 2024-04-09 MED ORDER — LEVOTHYROXINE SODIUM 50 MCG PO TABS
50.0000 ug | ORAL_TABLET | Freq: Every day | ORAL | 0 refills | Status: DC
  Filled 2024-04-09: qty 90, 90d supply, fill #0

## 2024-04-09 NOTE — Progress Notes (Signed)
   GYNECOLOGY POSTOPERATIVE VISIT  Subjective:     Mercedes [name] is a 78 y.o. female who presents to the clinic 2 weeks status post Hysteroscopy, Dilation and Curettage, Polypectomy for postmenopausal bleeding. Eating a regular diet without difficulty. Bowel movements are normal. The patient is not having any pain.  The following portions of the patient's history were reviewed and updated as appropriate: allergies, current medications, past family history, past medical history, past social history, past surgical history, and problem list.  Review of Systems Pertinent items noted in HPI and remainder of comprehensive ROS otherwise negative.    Objective:    BP (!) 151/74   Pulse 72   Ht 5' 4 (1.626 m)   Wt 226 lb (102.5 kg)   BMI 38.79 kg/m  General:  alert and no distress  Abdomen: soft, bowel sounds active, non-tender  Pelvic:   deferred   03/24/2024 SURGICAL PATHOLOGY ENDOMETRIUM, CURETTAGE:  Benign endometrial polyp  Benign inactive to weakly proliferative endometrium  Abundant mucus with admixed reactive metaplastic squamous epithelium  Negative for breakdown, atypia, hyperplasia and carcinoma    Assessment:    Doing well postoperatively. Operative findings again reviewed. Pathology report discussed.    Plan:    1. Continue any current medications. 2. Activity restrictions: none 3. PMB precautions reviewed 4. Follow up with Urogynecology for ongoing management of urinary issues, and with our providers for other medical concerns.    GLORIS HUGGER, MD, FACOG Obstetrician & Gynecologist, [facility] for [employer], [facility]

## 2024-04-10 ENCOUNTER — Other Ambulatory Visit: Payer: Self-pay

## 2024-04-10 DIAGNOSIS — H353231 Exudative age-related macular degeneration, bilateral, with active choroidal neovascularization: Secondary | ICD-10-CM | POA: Diagnosis not present

## 2024-04-10 DIAGNOSIS — H353211 Exudative age-related macular degeneration, right eye, with active choroidal neovascularization: Secondary | ICD-10-CM | POA: Diagnosis not present

## 2024-04-10 DIAGNOSIS — H353221 Exudative age-related macular degeneration, left eye, with active choroidal neovascularization: Secondary | ICD-10-CM | POA: Diagnosis not present

## 2024-04-21 ENCOUNTER — Telehealth: Payer: Self-pay

## 2024-04-21 NOTE — Telephone Encounter (Signed)
 Copied from CRM #[id number]. Topic: General - Other >> Apr 21, 2024 11:17 AM Thersia BROCKS wrote: Reason for CRM: Patient called in regarding the covid vaccine stated she was told that the covid vaccine was offer in the office and would like for someone to give her a callback to get scheduled for the covid vaccine

## 2024-04-22 ENCOUNTER — Ambulatory Visit

## 2024-04-22 VITALS — BP 130/78 | Ht 64.0 in | Wt 223.0 lb

## 2024-04-22 DIAGNOSIS — Z0000 Encounter for general adult medical examination without abnormal findings: Secondary | ICD-10-CM | POA: Diagnosis not present

## 2024-04-22 NOTE — Telephone Encounter (Signed)
 Pt (and husband, Claudean) are scheduled for their Covid shots on 05/19/24 at 10:00 and 10:15.

## 2024-04-22 NOTE — Progress Notes (Signed)
 Please attest and cosign this visit due to patients primary care provider not being in the office at the time the visit was completed.    Subjective:   Mercedes [name] is a 78 y.o. who presents for a Medicare Wellness preventive visit.  As a reminder, Annual Wellness Visits don't include a physical exam, and some assessments may be limited, especially if this visit is performed virtually. We may recommend an in-person follow-up visit with your provider if needed.  Visit Complete: In person  Persons Participating in Visit: Patient.  AWV Questionnaire: Yes: Patient Medicare AWV questionnaire was completed by the patient on 04/21/24; I have confirmed that all information answered by patient is correct and no changes since this date.  Cardiac Risk Factors include: advanced age (>57men, >18 women);dyslipidemia;hypertension;obesity (BMI >30kg/m2)     Objective:    Today's Vitals   04/22/24 1427  BP: 130/78  Weight: 223 lb (101.2 kg)  Height: 5' 4 (1.626 m)   Body mass index is 38.28 kg/m.     04/22/2024    2:39 PM 03/24/2024   12:41 PM 02/19/2023    2:39 PM 12/06/2022    2:30 PM 08/20/2022   10:41 AM 08/15/2022   11:18 AM 05/16/2022    9:18 AM  Advanced Directives  Does Patient Have a Medical Advance Directive? Yes Yes Yes Yes No No Yes  Type of Estate agent of [place];Living will Healthcare Power of [place];Living will  Healthcare Power of [place];Living will   Healthcare Power of [place];Living will  Does patient want to make changes to medical advance directive?    No - Patient declined     Copy of Healthcare Power of Attorney in Chart? Yes - validated most recent copy scanned in chart (See row information) No - copy requested  Yes - validated most recent copy scanned in chart (See row information)   No - copy requested  Would patient like information on creating a medical advance directive?     No - Patient declined      Current Medications  (verified) Outpatient Encounter Medications as of 04/22/2024  Medication Sig   acetaminophen  (TYLENOL ) 500 MG tablet Take 2 tablets (1,000 mg total) by mouth every 6 (six) hours as needed for moderate pain (pain score 4-6) or mild pain (pain score 1-3).   Aflibercept  (EYLEA ) 2 MG/0.05ML SOLN Injected in R eye.   ascorbic acid (VITAMIN C) 500 MG tablet Take 500 mg by mouth daily.   cholecalciferol (VITAMIN D ) 1000 units tablet Take 1,000 Units by mouth 2 (two) times a day.    colchicine  0.6 MG tablet Take 1 tablet by mouth twice daily as needed   CRANBERRY PO Take 1 capsule by mouth in the morning and at bedtime.   faricimab -svoa (VABYSMO ) 6 MG/0.05ML SOLN intravitreal injection 0.05 mLs (6 mg total) by Intravitreal route as directed. (Patient taking differently: 6 mg by Intravitreal route as directed. Every 6 weeks)   ibuprofen  (ADVIL ) 600 MG tablet Take 1 tablet (600 mg total) by mouth 3 (three) times daily with meals as needed for headache, mild pain (pain score 1-3), moderate pain (pain score 4-6) or cramping.   levothyroxine  (SYNTHROID ) 200 MCG tablet Take 1 tablet (200 mcg total) by mouth daily before breakfast.   levothyroxine  (SYNTHROID ) 50 MCG tablet Take 1 tablet (50 mcg total) by mouth daily before breakfast. Take along with 200 mcg tablet.   metoprolol  (TOPROL -XL) 200 MG 24 hr tablet Take 1 tablet (200 mg total)  by mouth every morning.   Multiple Vitamin (MULTIVITAMIN WITH MINERALS) TABS tablet Take 1 tablet by mouth daily.   Multiple Vitamins-Minerals (PRESERVISION AREDS 2) CAPS Take 1 capsule by mouth 2 (two) times daily.   NON FORMULARY D-MANOOSE   Omega 3 1000 MG CAPS Take 1,000 mg by mouth in the morning and at bedtime.   Potassium 99 MG TABS Take 99 mg by mouth in the morning and at bedtime.   pravastatin  (PRAVACHOL ) 40 MG tablet Take 1 tablet (40 mg total) by mouth daily.   psyllium (REGULOID) 0.52 G capsule Take 1.04 g by mouth daily.   TURMERIC PO Take 750 mg by mouth daily.    zinc  gluconate 50 MG tablet Take 50 mg by mouth daily.   oxyCODONE  (OXY IR/ROXICODONE ) 5 MG immediate release tablet Take 1 tablet (5 mg total) by mouth every 6 (six) hours as needed for breakthrough pain or severe pain (pain score 7-10). (Patient not taking: Reported on 04/22/2024)   No facility-administered encounter medications on file as of 04/22/2024.    Allergies (verified) Ace inhibitors, Amlodipine , Clarithromycin, Fenofibrate, Lipitor [atorvastatin ], Niacin, Penicillins, Sulfasalazine, and Tetracycline hcl   History: Past Medical History:  Diagnosis Date   Allergy    Cataract    bilateral-not an issue right now   Diverticulosis of colon    Fatty liver    GERD (gastroesophageal reflux disease)    Gout    HLD (hyperlipidemia)    under control   HTN (hypertension)    Hypothyroidism    Macular degeneration    Osteoarthritis    Rapid heartbeat    occasional, does not last long   Tobacco abuse    Past Surgical History:  Procedure Laterality Date   CATARACT EXTRACTION     CHOLECYSTECTOMY N/A 03/17/2014   Procedure: LAPAROSCOPIC CHOLECYSTECTOMY WITH INTRAOPERATIVE CHOLANGIOGRAM ;  Surgeon: Elon Pacini, MD;  Location: [facility];  Service: General;  Laterality: N/A;   COLONOSCOPY     removed polyps   DILATATION & CURRETTAGE/HYSTEROSCOPY WITH RESECTOCOPE N/A 03/24/2024   Procedure: DILATATION & CURETTAGE/HYSTEROSCOPY WITH RESECTOCOPE;  Surgeon: Herchel Gloris LABOR, MD;  Location: [facility];  Service: Gynecology;  Laterality: N/A;  Hysteroscopy/Polyp resect/D&C   EYE SURGERY  Cataracts   lower back surgery      MYOSURE RESECTION  03/24/2024   Procedure: MYOSURE RESECTION;  Surgeon: Herchel Gloris LABOR, MD;  Location: [facility];  Service: Gynecology;;   PARATHYROIDECTOMY Left 08/20/2022   Procedure: LEFT PARATHYROIDECTOMY;  Surgeon: Eletha Boas, MD;  Location: [facility];  Service: General;  Laterality: Left;   SIGMOIDOSCOPY     SPINE SURGERY     Family History  Problem Relation Age  of Onset   Angina Mother    Arthritis Mother    Hearing loss Mother    Heart disease Mother    Hyperlipidemia Mother    Miscarriages / [place] Mother    Vision loss Mother    Ulcers Father    Hearing loss Father    Vision loss Father    Hypertension Brother    Prostate cancer Brother    Cancer Brother    Colon cancer Neg Hx    Breast cancer Neg Hx    Esophageal cancer Neg Hx    Stomach cancer Neg Hx    Rectal cancer Neg Hx    Uterine cancer Neg Hx    Bladder Cancer Neg Hx    Social History   Socioeconomic History   Marital status: Married    Spouse  name: Claudean   Number of children: 2   Years of education: Not on file   Highest education level: Master's degree (e.g., MA, MS, MEng, MEd, MSW, MBA)  Occupational History   Occupation: Teacher-retired  Tobacco Use   Smoking status: Former    Current packs/day: 0.00    Average packs/day: 0.3 packs/day for 17.3 years (4.4 ttl pk-yrs)    Types: Cigarettes    Start date: 07/05/1995    Quit date: 07/04/2010    Years since quitting: 13.8   Smokeless tobacco: Never  Vaping Use   Vaping status: Never Used  Substance and Sexual Activity   Alcohol use: Yes    Alcohol/week: 5.0 standard drinks of alcohol    Types: 5 Glasses of wine per week    Comment: occas wine   Drug use: No   Sexual activity: Not Currently    Birth control/protection: Post-menopausal  Other Topics Concern   Not on file  Social History Narrative   Married 1964   2 kids, in [place].  2 grandkids   Volunteered prev with [employer]   Enjoyed golf but gave it up after her husband had a stroke   Social Drivers of Corporate investment banker Strain: Low Risk  (04/21/2024)   Overall Financial Resource Strain (CARDIA)    Difficulty of Paying Living Expenses: Not hard at all  Food Insecurity: No Food Insecurity (04/21/2024)   Hunger Vital Sign    Worried About Running Out of Food in the Last Year: Never true    Ran Out of Food in the Last Year:  Never true  Transportation Needs: Unknown (04/21/2024)   PRAPARE - Administrator, Civil Service (Medical): No    Lack of Transportation (Non-Medical): Not on file  Physical Activity: Inactive (04/21/2024)   Exercise Vital Sign    Days of Exercise per Week: 0 days    Minutes of Exercise per Session: Not on file  Stress: No Stress Concern Present (12/12/2023)   [employer] of Occupational Health - Occupational Stress Questionnaire    Feeling of Stress: Only a little  Social Connections: Unknown (04/21/2024)   Social Connection and Isolation Panel    Frequency of Communication with Friends and Family: Three times a week    Frequency of Social Gatherings with Friends and Family: Twice a week    Attends Religious Services: Not on Marketing executive or Organizations: No    Attends Engineer, structural: Not on file    Marital Status: Married    Tobacco Counseling Counseling given: Not Answered    Clinical Intake:  Pre-visit preparation completed: Yes  Pain : No/denies pain     BMI - recorded: 38.28 Nutritional Status: BMI > 30  Obese Nutritional Risks: None Diabetes: No  Lab Results  Component Value Date   HGBA1C 5.7 12/12/2021   HGBA1C 6.0 12/05/2020   HGBA1C 5.1 01/28/2020     How often do you need to have someone help you when you read instructions, pamphlets, or other written materials from your doctor or pharmacy?: 1 - Never  Interpreter Needed?: No  Comments: lives with husband Information entered by :: B.Hai Grabe,LPN   Activities of Daily Living     04/21/2024   10:59 AM 03/24/2024   12:48 PM  In your present state of health, do you have any difficulty performing the following activities:  Hearing?  0  Vision? 0 0  Difficulty concentrating or making decisions?  0 0  Walking or climbing stairs? 0   Dressing or bathing? 0   Doing errands, shopping? 0   Preparing Food and eating ? N   Using the Toilet? N   In the  past six months, have you accidently leaked urine? Y   Do you have problems with loss of bowel control? N   Managing your Finances? N   Housekeeping or managing your Housekeeping? N     Patient Care Team: Cleatus Arlyss RAMAN, MD as PCP - General (Family Medicine) Mittie Gaskin, MD as Referring Physician (Ophthalmology) Hester Alm BROCKS, MD as Referring Physician (Dermatology)  I have updated your Care Teams any recent Medical Services you may have received from other providers in the past year.     Assessment:   This is a routine wellness examination for Mercedes [name].  Hearing/Vision screen Hearing Screening - Comments:: Patient denies any hearing difficulties.   Vision Screening - Comments:: Pt says their vision is good without glasses Dr  [facility]   Goals Addressed             This Visit's Progress    Patient Stated   On track    04/22/24- I will maintain and continue medications as prescribed.      COMPLETED: Patient Stated   Not on track    Exercise more     Weight (lb) < 200 lb (90.7 kg)   223 lb (101.2 kg)    Discussion regarding weight loss/obesity w/PCP     COMPLETED: Weight < 200 lb (90.7 kg)   223 lb (101.2 kg)    Target weight loss is 50 lbs. Starting 01/14/2018, I will continue to drink protein shakes for 2 meals and minimize intake of carbs during dinner. Daily intake of carbs does not exceed 20 grams.         Depression Screen     04/22/2024    2:32 PM 12/30/2023    3:11 PM 12/19/2023   11:29 AM 10/01/2023    4:31 PM 02/04/2023   12:16 PM 12/13/2022   12:21 PM 12/06/2022    2:29 PM  PHQ 2/9 Scores  PHQ - 2 Score 0 0 0 0 0 0 0  PHQ- 9 Score  0  0 0 0     Fall Risk     04/21/2024   10:59 AM 12/30/2023    3:11 PM 12/19/2023   11:29 AM 12/06/2023    3:29 PM 10/07/2023   11:42 AM  Fall Risk   Falls in the past year? 1 0 0 0 0  Number falls in past yr: 0 0 0 0 0  Injury with Fall? 0 0 0 0 0  Risk for fall due to : No Fall Risks No Fall Risks No  Fall Risks  No Fall Risks  Follow up Education provided;Falls prevention discussed Falls evaluation completed Falls evaluation completed  Falls evaluation completed    MEDICARE RISK AT HOME:  Medicare Risk at Home Any stairs in or around the home?: (Patient-Rptd) Yes If so, are there any without handrails?: (Patient-Rptd) No Adequate lighting in your home to reduce risk of falls?: (Patient-Rptd) Yes Life alert?: (Patient-Rptd) No Use of a cane, walker or w/c?: (Patient-Rptd) No Grab bars in the bathroom?: (Patient-Rptd) Yes Shower chair or bench in shower?: (Patient-Rptd) Yes  TIMED UP AND GO:  Was the test performed?  Yes  Length of time to ambulate 10 feet: 12 sec Gait slow and steady without use of assistive device  Cognitive Function: 6CIT completed    08/05/2020    1:54 PM 01/14/2018   12:25 PM 12/23/2015    8:52 AM  MMSE - Mini Mental State Exam  Orientation to time 5 5 5    Orientation to Place 5 5 5    Registration 3 3 3    Attention/ Calculation 5 0 0   Recall 3 3 3    Language- name 2 objects  0 0   Language- repeat 1 1 1   Language- follow 3 step command  3 3   Language- read & follow direction  0 0   Write a sentence  0 0   Copy design  0 0   Total score  20 20      Data saved with a previous flowsheet row definition        04/22/2024    2:44 PM 12/06/2022    2:32 PM  6CIT Screen  What Year? 0 points 0 points  What month? 0 points 0 points  What time? 0 points 0 points  Count back from 20 0 points 0 points  Months in reverse 0 points 0 points  Repeat phrase 0 points 0 points  Total Score 0 points 0 points    Immunizations Immunization History  Administered Date(s) Administered    sv, Bivalent, Protein Subunit Rsvpref,pf (Abrysvo) 04/17/2022   Fluad Quad(high Dose 65+) 03/12/2019, 03/26/2020, 03/17/2021, 04/12/2022   Fluad Trivalent(High Dose 65+) 04/24/2023   H1N1 07/08/2008   INFLUENZA, HIGH DOSE SEASONAL PF 04/08/2024   Influenza Whole 05/23/2004    Influenza,inj,Quad PF,6+ Mos 04/07/2014, 03/31/2015, 04/18/2016, 04/04/2017, 04/17/2018   Moderna Covid-19 Fall Seasonal Vaccine 32yrs & older 04/17/2022   PFIZER Comirnaty (Gray Top)Covid-19 Tri-Sucrose Vaccine 01/17/2021   PFIZER(Purple Top)SARS-COV-2 Vaccination 07/09/2019, 07/30/2019, 04/25/2020   Pfizer Covid-19 Vaccine Bivalent Booster 58yrs & up 05/02/2021   Pfizer(Comirnaty )Fall Seasonal Vaccine 12 years and older 04/17/2022, 06/11/2023   Pneumococcal Conjugate-13 12/13/2014   Pneumococcal Polysaccharide-23 08/09/2010   Rabies, IM 07/30/2017, 08/02/2017, 08/06/2017, 08/13/2017   Td 07/02/1998, 07/08/2008   Tdap 07/30/2017   Zoster Recombinant(Shingrix) 09/21/2020, 11/22/2020   Zoster, Live 10/30/2010    Screening Tests Health Maintenance  Topic Date Due   Colonoscopy  11/05/2023   COVID-19 Vaccine (8 - 2025-26 season) 03/02/2024   Mammogram  07/15/2024   Medicare Annual Wellness (AWV)  04/22/2025   DTaP/Tdap/Td (4 - Td or Tdap) 07/31/2027   Pneumococcal Vaccine: 50+ Years  Completed   Influenza Vaccine  Completed   DEXA SCAN  Completed   Zoster Vaccines- Shingrix  Completed   Meningococcal B Vaccine  Aged Out   Hepatitis C Screening  Discontinued    Health Maintenance Items Addressed: Pt says she has gastroenterology appt next month and Covid vaccine scheduled on 05/19/24  Additional Screening:  Vision Screening: Recommended annual ophthalmology exams for early detection of glaucoma and other disorders of the eye. Is the patient up to date with their annual eye exam?  Yes  Who is the provider or what is the name of the office in which the patient attends annual eye exams? Dr Laurita  Dental Screening: Recommended annual dental exams for proper oral hygiene  Community Resource Referral / Chronic Care Management: CRR required this visit?  No   CCM required this visit?  Appt scheduled with PCP: CPE and OV   Plan:    I have personally reviewed and noted the  following in the patient's chart:   Medical and social history Use of alcohol, tobacco or illicit drugs  Current medications and supplements including opioid prescriptions. Patient is not currently taking opioid prescriptions. Functional ability and status Nutritional status Physical activity Advanced directives List of other physicians Hospitalizations, surgeries, and ER visits in previous 12 months Vitals Screenings to include cognitive, depression, and falls Referrals and appointments  In addition, I have reviewed and discussed with patient certain preventive protocols, quality metrics, and best practice recommendations. A written personalized care plan for preventive services as well as general preventive health recommendations were provided to patient.   Erminio LITTIE Saris, LPN   [id number]/77/7974   After Visit Summary: (In Person-Declined) Patient declined AVS at this time.  Notes: Pt requested appt w/PCP to discuss weight/obesity. I also scheduled a CPE for Nov 2025 (as no CPE in 2years)

## 2024-04-22 NOTE — Patient Instructions (Addendum)
 Mercedes [name],  Thank you for taking the time for your Medicare Wellness Visit. I appreciate your continued commitment to your health goals. Please review the care plan we discussed, and feel free to reach out if I can assist you further.  Medicare recommends these wellness visits once per year to help you and your care team stay ahead of potential health issues. These visits are designed to focus on prevention, allowing your provider to concentrate on managing your acute and chronic conditions during your regular appointments.  Please note that Annual Wellness Visits do not include a physical exam. Some assessments may be limited, especially if the visit was conducted virtually. If needed, we may recommend a separate in-person follow-up with your provider.  Ongoing Care Seeing your primary care provider every 3 to 6 months helps us  monitor your health and provide consistent, personalized care.   Referrals If a referral was made during today's visit and you haven't received any updates within two weeks, please contact the referred provider directly to check on the status.  Recommended Screenings:  Health Maintenance  Topic Date Due   Colon Cancer Screening  11/05/2023   COVID-19 Vaccine (8 - 2025-26 season) 03/02/2024   Breast Cancer Screening  07/15/2024   Medicare Annual Wellness Visit  04/22/2025   DTaP/Tdap/Td vaccine (4 - Td or Tdap) 07/31/2027   Pneumococcal Vaccine for age over 46  Completed   Flu Shot  Completed   DEXA scan (bone density measurement)  Completed   Zoster (Shingles) Vaccine  Completed   Meningitis B Vaccine  Aged Out   Hepatitis C Screening  Discontinued       04/22/2024    2:39 PM  Advanced Directives  Does Patient Have a Medical Advance Directive? Yes  Type of Estate agent of [place];Living will  Copy of Healthcare Power of Attorney in Chart? Yes - validated most recent copy scanned in chart (See row information)   Advance Care  Planning is important because it: Ensures you receive medical care that aligns with your values, goals, and preferences. Provides guidance to your family and loved ones, reducing the emotional burden of decision-making during critical moments.  Vision: Annual vision screenings are recommended for early detection of glaucoma, cataracts, and diabetic retinopathy. These exams can also reveal signs of chronic conditions such as diabetes and high blood pressure.  Dental: Annual dental screenings help detect early signs of oral cancer, gum disease, and other conditions linked to overall health, including heart disease and diabetes.

## 2024-04-28 ENCOUNTER — Ambulatory Visit (INDEPENDENT_AMBULATORY_CARE_PROVIDER_SITE_OTHER): Admitting: Family Medicine

## 2024-04-28 ENCOUNTER — Other Ambulatory Visit: Payer: Self-pay

## 2024-04-28 ENCOUNTER — Encounter: Payer: Self-pay | Admitting: Family Medicine

## 2024-04-28 MED ORDER — WEGOVY 0.25 MG/0.5ML ~~LOC~~ SOAJ
0.2500 mg | SUBCUTANEOUS | 2 refills | Status: DC
  Filled 2024-04-28: qty 2, 28d supply, fill #0

## 2024-04-28 NOTE — Patient Instructions (Addendum)
 Let use see about the PA on wegovy.  We'll try to get that set up.  Let me know what you hear in the meantime.  Please check with the gym about a possible trainer.  Thanks for your effort.  Take care.  Glad to see you.

## 2024-04-28 NOTE — Progress Notes (Unsigned)
 She has been working on diet and weight.  She has joint pain with BMI >38.  Weight 209 lbs last year.  223 lbs today.  She was reading about ozempic/GLP1s, etc.   She got a recumbent elliptical.  She is walking when possible, as her ankles and legs allow.  D/w pt about checking on getting a trainer for weightbearing exercise.  GLP1 cautions d/w pt, ie GI sx, abdominal pain, pancreatitis, etc.  No history of pancreatitis.  Meds, vitals, and allergies reviewed.   ROS: Per HPI unless specifically indicated in ROS section   Nad Ncat Neck supple, no LA Rrr Ctab Abd soft, not ttp Skin well-perfused.

## 2024-04-29 NOTE — Assessment & Plan Note (Signed)
 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 I asked her to please check with the gym about a possible trainer.  Discussed weightbearing exercise.  Prescription sent for Ozempic with routine cautions.  Start with 0.25 mg weekly and increase to 0.5 mg weekly after 4 weeks if tolerated.  Discussed prior authorization process, I am awaiting determination on that.

## 2024-05-06 ENCOUNTER — Telehealth: Payer: Self-pay

## 2024-05-06 ENCOUNTER — Other Ambulatory Visit (HOSPITAL_COMMUNITY): Payer: Self-pay

## 2024-05-06 NOTE — Telephone Encounter (Signed)
 Pharmacy Patient Advocate Encounter   Received notification from Pt Calls Messages that prior authorization for Wegovy 0.25 is required/requested.   Insurance verification completed.   The patient is insured through [facility].   Per test claim: PA required; PA submitted to above mentioned insurance via Latent Key/confirmation #/EOC [id number] Status is pending

## 2024-05-06 NOTE — Telephone Encounter (Signed)
 I do not see that one has been started. Will forward to the PA Team.

## 2024-05-06 NOTE — Telephone Encounter (Signed)
 Copied from CRM [id number]. Topic: Clinical - Medication Prior Auth >> May 05, 2024  9:58 AM Deaijah H wrote: Reason for CRM: Patient called in to follow up on prior authorization for script [facility]. Please call [phone]

## 2024-05-07 ENCOUNTER — Other Ambulatory Visit: Payer: Self-pay | Admitting: Family Medicine

## 2024-05-08 ENCOUNTER — Other Ambulatory Visit (HOSPITAL_COMMUNITY): Payer: Self-pay

## 2024-05-08 ENCOUNTER — Other Ambulatory Visit: Payer: Self-pay

## 2024-05-08 ENCOUNTER — Other Ambulatory Visit (HOSPITAL_BASED_OUTPATIENT_CLINIC_OR_DEPARTMENT_OTHER): Payer: Self-pay

## 2024-05-08 MED ORDER — PRAVASTATIN SODIUM 40 MG PO TABS
40.0000 mg | ORAL_TABLET | Freq: Every day | ORAL | 0 refills | Status: DC
  Filled 2024-05-08: qty 90, 90d supply, fill #0

## 2024-05-08 MED ORDER — LEVOTHYROXINE SODIUM 200 MCG PO TABS
200.0000 ug | ORAL_TABLET | Freq: Every day | ORAL | 0 refills | Status: AC
  Filled 2024-05-08: qty 90, 90d supply, fill #0

## 2024-05-08 NOTE — Telephone Encounter (Signed)
 Pharmacy Patient Advocate Encounter   Received notification from Pt Calls Messages that prior authorization for Wegovy 0.25 is required/requested.   Insurance verification completed.   The patient is insured through [facility] ADVANTAGE/RX ADVANCE.   Per test claim: PA required; PA submitted to above mentioned insurance via Latent Key/confirmation #/EOC [id number] Status is pending   Resubmitted to patients [employer] plan

## 2024-05-11 ENCOUNTER — Other Ambulatory Visit (HOSPITAL_COMMUNITY): Payer: Self-pay

## 2024-05-11 NOTE — Telephone Encounter (Signed)
 Pharmacy Patient Advocate Encounter  Received notification from [employer] that Prior Authorization for Wegovy0.25 has been DENIED.  See denial reason below. No denial letter attached in CMM. Will attach denial letter to Media tab once received.    PA #/Case ID/Reference #: # [id number]

## 2024-05-11 NOTE — Telephone Encounter (Signed)
 Copied from CRM #[id number]. Topic: General - Call Back - No Documentation >> May 11, 2024 11:10 AM Alfonso ORN wrote: Reason for CRM: pt called last week to f/u on callback expected. Pt [employer] has contacted pt. Relayed message that PA submitted last week and to [employer]. Pt is requesting a callback

## 2024-05-12 ENCOUNTER — Other Ambulatory Visit: Payer: Self-pay

## 2024-05-12 NOTE — Telephone Encounter (Signed)
 Called and spoke with patient.  Relayed information of denial.  Pt verbalized understanding and has no questions or concerns.

## 2024-05-14 ENCOUNTER — Other Ambulatory Visit (HOSPITAL_COMMUNITY): Payer: Self-pay

## 2024-05-18 ENCOUNTER — Encounter: Payer: Self-pay | Admitting: Pharmacist

## 2024-05-18 ENCOUNTER — Other Ambulatory Visit: Payer: Self-pay

## 2024-05-18 MED ORDER — COMIRNATY 30 MCG/0.3ML IM SUSY
0.3000 mL | PREFILLED_SYRINGE | Freq: Once | INTRAMUSCULAR | 0 refills | Status: AC
  Filled 2024-05-19: qty 0.3, 1d supply, fill #0

## 2024-05-18 NOTE — Progress Notes (Signed)
 Pharmacy Quality Measure Review  This patient is appearing on a report for being at risk of failing the Controlling Blood Pressure measure this calendar year.   Last documented BP  BP Readings from Last 1 Encounters:  04/28/24 136/82   does meet criteria for measure closure (BP <140/90).  2025 f/u scheduled: YES Encounter note placed **THN pt - Ensure BP resting >68min. Recheck if higher than 139/79**    Future Appointments  Date Time Provider Department Center  05/25/2024 11:00 AM Cleatus Arlyss RAMAN, MD [facility] [address]  06/01/2024  9:40 AM Craig Alan JONELLE DEVONNA [facility]  06/19/2024 12:40 PM Marilynne Rosaline SAILOR, MD [facility]  03/09/2025  1:30 PM Hester Alm BROCKS, MD [facility] None  04/23/2025  3:00 PM [facility] ANNUAL WELLNESS VISIT 1 [facility] [address]

## 2024-05-19 ENCOUNTER — Other Ambulatory Visit: Payer: Self-pay

## 2024-05-25 ENCOUNTER — Other Ambulatory Visit: Payer: Self-pay

## 2024-05-25 ENCOUNTER — Ambulatory Visit: Admitting: Family Medicine

## 2024-05-25 ENCOUNTER — Encounter: Payer: Self-pay | Admitting: Family Medicine

## 2024-05-25 VITALS — BP 138/78 | HR 80 | Temp 97.9°F | Ht 62.75 in | Wt 222.0 lb

## 2024-05-25 DIAGNOSIS — I10 Essential (primary) hypertension: Secondary | ICD-10-CM

## 2024-05-25 DIAGNOSIS — M8580 Other specified disorders of bone density and structure, unspecified site: Secondary | ICD-10-CM | POA: Diagnosis not present

## 2024-05-25 DIAGNOSIS — Z0000 Encounter for general adult medical examination without abnormal findings: Secondary | ICD-10-CM

## 2024-05-25 DIAGNOSIS — E038 Other specified hypothyroidism: Secondary | ICD-10-CM

## 2024-05-25 DIAGNOSIS — R300 Dysuria: Secondary | ICD-10-CM | POA: Diagnosis not present

## 2024-05-25 DIAGNOSIS — M109 Gout, unspecified: Secondary | ICD-10-CM

## 2024-05-25 DIAGNOSIS — E785 Hyperlipidemia, unspecified: Secondary | ICD-10-CM

## 2024-05-25 DIAGNOSIS — R0602 Shortness of breath: Secondary | ICD-10-CM

## 2024-05-25 DIAGNOSIS — M545 Low back pain, unspecified: Secondary | ICD-10-CM

## 2024-05-25 DIAGNOSIS — Z7189 Other specified counseling: Secondary | ICD-10-CM

## 2024-05-25 LAB — URINALYSIS, ROUTINE W REFLEX MICROSCOPIC
Bilirubin Urine: NEGATIVE
Ketones, ur: NEGATIVE
Nitrite: POSITIVE — AB
Specific Gravity, Urine: 1.025 (ref 1.000–1.030)
Total Protein, Urine: 100 — AB
Urine Glucose: NEGATIVE
Urobilinogen, UA: 0.2 (ref 0.0–1.0)
pH: 6 (ref 5.0–8.0)

## 2024-05-25 LAB — LIPID PANEL
Cholesterol: 165 mg/dL (ref 0–200)
HDL: 42.1 mg/dL (ref >39.00)
NonHDL: 122.98
Total CHOL/HDL Ratio: 4
Triglycerides: 405 mg/dL — ABNORMAL HIGH (ref 0.0–149.0)
VLDL: 81 mg/dL — ABNORMAL HIGH (ref 0.0–40.0)

## 2024-05-25 LAB — HEPATIC FUNCTION PANEL
ALT: 16 U/L (ref 0–35)
AST: 17 U/L (ref 0–37)
Albumin: 4.5 g/dL (ref 3.5–5.2)
Alkaline Phosphatase: 55 U/L (ref 39–117)
Bilirubin, Direct: 0.2 mg/dL (ref 0.0–0.3)
Total Bilirubin: 1.4 mg/dL — ABNORMAL HIGH (ref 0.2–1.2)
Total Protein: 7.1 g/dL (ref 6.0–8.3)

## 2024-05-25 LAB — VITAMIN D 25 HYDROXY (VIT D DEFICIENCY, FRACTURES): VITD: 33.14 ng/mL (ref 30.00–100.00)

## 2024-05-25 LAB — LDL CHOLESTEROL, DIRECT: Direct LDL: 90 mg/dL

## 2024-05-25 LAB — BRAIN NATRIURETIC PEPTIDE: Pro B Natriuretic peptide (BNP): 102 pg/mL — ABNORMAL HIGH (ref 0.0–100.0)

## 2024-05-25 LAB — TSH: TSH: 0.17 u[IU]/mL — ABNORMAL LOW (ref 0.35–5.50)

## 2024-05-25 MED ORDER — PRAVASTATIN SODIUM 40 MG PO TABS: ORAL_TABLET | ORAL | Status: DC

## 2024-05-25 MED ORDER — CIPROFLOXACIN HCL 500 MG PO TABS
500.0000 mg | ORAL_TABLET | Freq: Two times a day (BID) | ORAL | 0 refills | Status: AC
  Filled 2024-05-25: qty 6, 3d supply, fill #0

## 2024-05-25 NOTE — Assessment & Plan Note (Signed)
 Advance directive- sons Elspeth and Alm equally designated if patient were incapacitated.  Then husband designated next if needed.

## 2024-05-25 NOTE — Patient Instructions (Addendum)
 Go to the lab on the way out.   If you have mychart we'll likely use that to update you.    Take care.  Glad to see you. Hold pravastatin  and let me know if the aches are better or not after 2 weeks.   Start cipro  in the meantime.

## 2024-05-25 NOTE — Progress Notes (Unsigned)
  colonoscopy 2022 DXA 2024 Mammogram 2025 Advance directive- sons Elspeth and Alm equally designated if patient were incapacitated.  Then husband designated next if needed.   Vaccines d/w pt.    Dysuria.  Burning with urination.  Started 3 days ago.  No fevers.  No chills  has urogyn f/u pending.    She is putting up with her back at baseline. L lower back usually, more with standing. Prev with radiation down the L leg, that got better with PT.  Still with occ tingling in the L leg.  She had neurosurgery eval prev.    History of gout.  No recent colchine use.  Uric acid checked 2025.     Hypothyroidism.  TSh pending.  No neck mass or lumps.  Compliant with levothyroxine .  D/w pt about coverage for wegovy - we couldn't get that covered prev.     Hypertension:               Using medication without problems or lightheadedness:  yes Chest pain with exertion:no Edema: no- resolved off amlodipine .   Short of breath: some with exertion, BNP pending.    Elevated Cholesterol: Using medications without problems: yes Muscle aches: some joint pain.  D/w pt about holding pravastatin  for 2 weeks to see about response.  Diet compliance: yes Exercise: encouraged.   Labs pending.   History of hypercalcemia.  F/u PTH pending.  D/w pt. see notes on labs.   Meds, vitals, and allergies reviewed.    PMH and SH reviewed   ROS: Per HPI unless specifically indicated in ROS section    GEN: nad, alert and oriented HEENT: ncat NECK: supple w/o LA CV: rrr. PULM: ctab, no inc wob ABD: soft, +bs EXT: no edema SKIN: no acute rash

## 2024-05-27 NOTE — Assessment & Plan Note (Signed)
 TSh pending.  No neck mass or lumps.  Compliant with levothyroxine .

## 2024-05-27 NOTE — Assessment & Plan Note (Signed)
  colonoscopy 2022 DXA 2024 Mammogram 2025 Advance directive- sons Elspeth and Alm equally designated if patient were incapacitated.  Then husband designated next if needed.   Vaccines d/w pt.

## 2024-05-27 NOTE — Assessment & Plan Note (Signed)
 History of. F/u PTH pending. See notes on labs.

## 2024-05-27 NOTE — Assessment & Plan Note (Signed)
 She is dealing with her current symptoms. Prev with radiation down the L leg, that got better with PT.  Still with occ tingling in the L leg.  She had neurosurgery eval prev.  She can update me as needed.

## 2024-05-27 NOTE — Assessment & Plan Note (Signed)
 No recent colchine use.  Uric acid checked 2025.   Continue as is.  Update me as needed.

## 2024-05-27 NOTE — Assessment & Plan Note (Addendum)
 Hold pravastatin  and let me know if the aches are better or not after 2 weeks.

## 2024-05-27 NOTE — Assessment & Plan Note (Signed)
 See notes on labs regarding BNP.  Lungs are clear.  Okay for outpatient follow-up.  Continue metoprolol  as is.

## 2024-05-27 NOTE — Progress Notes (Signed)
 06/01/2024 Mercedes [name] [id number] [birth year]  Referring provider: Cleatus Arlyss RAMAN, MD Primary GI doctor: Dr. San (Dr. Teressa)  ASSESSMENT AND PLAN:  Personal history of precancerous colon polyps ( 14 polyps) 2015 colonoscopy ( first colonoscopy)  6 adenomas, including one that was 1.2cm.   Colonoscopy September 2018 3 subcentimeter polyps were found and removed.  These were mixed adenomatous and sessile serrated polyps.  11/04/2020 colonoscopy 5 tubular adenomatous polyps 2 to 6 mm transverse ascending colon, diverticulosis left colon recall 3 years - Referred to cardiologist for evaluation before scheduling colonoscopy. - Schedule colonoscopy post-cardiac evaluation.  Obesity  Body mass index is 38.49 kg/m.  -Patient has been advised to make an attempt to improve diet and exercise patterns to aid in weight loss. -Recommended diet heavy in fruits and veggies and low in animal meats, cheeses, and dairy products, appropriate calorie intake  SOB No chest pain associated but has some upper back pain Brother with CAD early 70's, mom with CAD  Likely from deconditioning/weight gain but with family history and SOB will refer to cardiologist prior to colonoscopy Has appointment Dec 8th  Urinary frequency Following with uro/gyn -Suggest continuing with pelvic floor PT -Add on fiber  Patient Care Team: Cleatus Arlyss RAMAN, MD as PCP - General (Family Medicine) Mittie Gaskin, MD as Referring Physician (Ophthalmology) Hester Alm BROCKS, MD as Referring Physician (Dermatology)  HISTORY OF PRESENT ILLNESS: 78 y.o. female with a past medical history listed below presents for evaluation of colonoscopy.   Last seen in the office April 2022 by Dr. Teressa for discussion about colon cancer screening.  Discussed the use of AI scribe software for clinical note transcription with the patient, who gave verbal consent to proceed.  History of Present Illness   Mercedes [name]  is an 78 year old female who presents for follow-up regarding her gastrointestinal health.  She has a history of multiple colon polyps, with six polyps removed in 2015, three in 2018, and five in 2022. Her last colonoscopy was in 2022. She has not had any colonoscopies prior to 2015.  She experiences shortness of breath on exertion, such as when climbing stairs or carrying objects. No associated chest pain, jaw pain, shoulder pain, or back pain. Occasionally, she experiences tachycardia upon waking but has not been tested for sleep apnea. She does not wake up feeling tired, except due to frequent urination related to her bladder issues.  She has been dealing with bladder issues for about three years, which have not improved. She underwent a hysteroscopy in September, which was benign, and a polyp was removed. She has tried various treatments, including topical therapies and pelvic floor physical therapy, which was stopped due to the hysteroscopy.  Her bowel movements are frequent, sometimes up to four times a day, and are generally soft. She does not feel that she has complete bowel movements.  She reports occasional reflux, which she attributes to dietary choices. No frequent swelling in her legs. She has a family history of heart issues, with her brother having died from heart disease and prostate cancer, and her mother having had angina.        She  reports that she quit smoking about 13 years ago. Her smoking use included cigarettes. She started smoking about 28 years ago. She has a 5.2 pack-year smoking history. She has never used smokeless tobacco. She reports current alcohol use of about 5.0 standard drinks of alcohol per week. She reports that she does not use drugs.  RELEVANT GI HISTORY, IMAGING AND LABS: Results   PATHOLOGY Hysteroscopy: Benign polyp removal (03/2024)      CBC    Component Value Date/Time   WBC 7.8 03/24/2024 1307   RBC 4.85 03/24/2024 1307   HGB 16.3 (H)  03/24/2024 1307   HCT 47.5 (H) 03/24/2024 1307   PLT 171 03/24/2024 1307   MCV 97.9 03/24/2024 1307   MCH 33.6 03/24/2024 1307   MCHC 34.3 03/24/2024 1307   RDW 13.1 03/24/2024 1307   LYMPHSABS 2.7 11/06/2021 1114   MONOABS 0.9 11/06/2021 1114   EOSABS 130 12/12/2023 1113   BASOSABS 54 12/12/2023 1113   Recent Labs    12/12/23 1113 03/24/24 1307  HGB 14.9 16.3*    CMP     Component Value Date/Time   NA 141 03/24/2024 1307   K 4.3 03/24/2024 1307   CL 105 03/24/2024 1307   CO2 24 03/24/2024 1307   GLUCOSE 109 (H) 03/24/2024 1307   BUN 17 03/24/2024 1307   CREATININE 0.72 03/24/2024 1307   CREATININE 0.80 12/12/2023 1113   CALCIUM  10.0 05/25/2024 1130   PROT 7.1 05/25/2024 1130   ALBUMIN 4.5 05/25/2024 1130   AST 17 05/25/2024 1130   ALT 16 05/25/2024 1130   ALKPHOS 55 05/25/2024 1130   BILITOT 1.4 (H) 05/25/2024 1130   GFRNONAA >60 03/24/2024 1307   GFRAA >60 03/14/2020 0943      Latest Ref Rng & Units 05/25/2024   11:30 AM 12/12/2023   11:13 AM 12/12/2021   12:56 PM  Hepatic Function  Total Protein 6.0 - 8.3 g/dL 7.1  7.4  6.7   Albumin 3.5 - 5.2 g/dL 4.5   4.2   AST 0 - 37 U/L 17  17  20    ALT 0 - 35 U/L 16  16  26    Alk Phosphatase 39 - 117 U/L 55   67   Total Bilirubin 0.2 - 1.2 mg/dL 1.4  0.9  1.3   Bilirubin, Direct 0.0 - 0.3 mg/dL 0.2         Current Medications:   Current Outpatient Medications (Endocrine & Metabolic):    levothyroxine  (SYNTHROID ) 200 MCG tablet, Take 1 tablet (200 mcg total) by mouth daily before breakfast.   levothyroxine  (SYNTHROID ) 50 MCG tablet, Take 1 tablet (50 mcg total) by mouth daily before breakfast. Take along with 200 mcg tablet.  Current Outpatient Medications (Cardiovascular):    metoprolol  (TOPROL -XL) 200 MG 24 hr tablet, Take 1 tablet (200 mg total) by mouth every morning.   pravastatin  (PRAVACHOL ) 40 MG tablet, Held as of 05/25/24   Current Outpatient Medications (Analgesics):    acetaminophen  (TYLENOL ) 500 MG  tablet, Take 2 tablets (1,000 mg total) by mouth every 6 (six) hours as needed for moderate pain (pain score 4-6) or mild pain (pain score 1-3).   colchicine  0.6 MG tablet, Take 1 tablet by mouth twice daily as needed   ibuprofen  (ADVIL ) 600 MG tablet, Take 1 tablet (600 mg total) by mouth 3 (three) times daily with meals as needed for headache, mild pain (pain score 1-3), moderate pain (pain score 4-6) or cramping.   Current Outpatient Medications (Other):    Aflibercept  (EYLEA ) 2 MG/0.05ML SOLN, Injected in R eye.   ascorbic acid (VITAMIN C) 500 MG tablet, Take 500 mg by mouth daily.   cholecalciferol (VITAMIN D ) 1000 units tablet, Take 1,000 Units by mouth 2 (two) times a day.    CRANBERRY PO, Take 1 capsule by mouth in the  morning and at bedtime.   faricimab -svoa (VABYSMO ) 6 MG/0.05ML SOLN intravitreal injection, 0.05 mLs (6 mg total) by Intravitreal route as directed.   Multiple Vitamin (MULTIVITAMIN WITH MINERALS) TABS tablet, Take 1 tablet by mouth daily.   Multiple Vitamins-Minerals (PRESERVISION AREDS 2) CAPS, Take 1 capsule by mouth 2 (two) times daily.   NON FORMULARY, D-MANOOSE   Omega 3 1000 MG CAPS, Take 1,000 mg by mouth in the morning and at bedtime.   Potassium 99 MG TABS, Take 99 mg by mouth in the morning and at bedtime.   psyllium (REGULOID) 0.52 G capsule, Take 1.04 g by mouth daily.   TURMERIC PO, Take 750 mg by mouth daily.   zinc  gluconate 50 MG tablet, Take 50 mg by mouth daily.  Medical History:  Past Medical History:  Diagnosis Date   Allergy    Cataract    bilateral-not an issue right now   Diverticulosis of colon    Fatty liver    GERD (gastroesophageal reflux disease)    Gout    HLD (hyperlipidemia)    under control   HTN (hypertension)    Hypothyroidism    Macular degeneration    Osteoarthritis    Rapid heartbeat    occasional, does not last long   Tobacco abuse    Allergies:  Allergies  Allergen Reactions   Ace Inhibitors Cough   Amlodipine       Edema at 5mg  dose.     Clarithromycin     REACTION: GI upset   Fenofibrate Itching    REACTION: itching- pt unsure   Lipitor [Atorvastatin ]     myalgias   Niacin     burning, made pt pass out   Penicillins     REACTION: Purpura   Sulfasalazine     REACTION: Purpura   Tetracycline Hcl     REACTION: GI upset     Surgical History:  She  has a past surgical history that includes Sigmoidoscopy; Colonoscopy; Cholecystectomy (N/A, 03/17/2014); Cataract extraction; lower back surgery ; Parathyroidectomy (Left, 08/20/2022); Dilatation & currettage/hysteroscopy with resectoscope (N/A, 03/24/2024); Myosure resection (03/24/2024); Eye surgery (Cataracts); and Spine surgery. Family History:  Her family history includes Angina in her mother; Arthritis in her mother; Cancer in her brother; Hearing loss in her father and mother; Heart disease in her mother; Hyperlipidemia in her mother; Hypertension in her brother; Miscarriages / Stillbirths in her mother; Prostate cancer in her brother; Ulcers in her father; Vision loss in her father and mother.  REVIEW OF SYSTEMS  : All other systems reviewed and negative except where noted in the History of Present Illness.  PHYSICAL EXAM: BP (!) 160/82   Pulse 76   Ht 5' 4 (1.626 m)   Wt 224 lb 4 oz (101.7 kg)   BMI 38.49 kg/m  Physical Exam   GENERAL APPEARANCE: Well nourished, in no apparent distress. HEENT: No cervical lymphadenopathy, unremarkable thyroid , sclerae anicteric, conjunctiva pink. RESPIRATORY: Respiratory effort normal, breath sounds equal bilaterally without rales, rhonchi, or wheezing. Lungs normal to auscultation. CARDIO: Regular rate and rhythm with no murmurs, rubs, or gallops. Peripheral pulses intact. Heart normal to auscultation. ABDOMEN: Soft, non-distended, active bowel sounds in all four quadrants, no tenderness to palpation, no rebound, no mass appreciated. RECTAL: Declines. MUSCULOSKELETAL: Full range of motion, normal  gait, without edema. SKIN: Dry, intact without rashes or lesions. No jaundice. NEURO: Alert, oriented, no focal deficits. PSYCH: Cooperative, normal mood and affect.      Alan JONELLE Coombs, PA-C 10:16 AM

## 2024-05-27 NOTE — Assessment & Plan Note (Signed)
 Presumed cystitis.  Start Cipro  with routine cautions, given history of intolerance to penicillin and Septra .  See notes on labs.

## 2024-05-29 LAB — PTH, INTACT AND CALCIUM
Calcium: 10 mg/dL (ref 8.6–10.4)
PTH: 34 pg/mL (ref 16–77)

## 2024-05-29 LAB — URINE CULTURE
MICRO NUMBER:: 17275803
SPECIMEN QUALITY:: ADEQUATE

## 2024-05-31 ENCOUNTER — Ambulatory Visit: Payer: Self-pay | Admitting: Family Medicine

## 2024-05-31 DIAGNOSIS — E038 Other specified hypothyroidism: Secondary | ICD-10-CM

## 2024-06-01 ENCOUNTER — Encounter: Payer: Self-pay | Admitting: Physician Assistant

## 2024-06-01 ENCOUNTER — Ambulatory Visit: Admitting: Physician Assistant

## 2024-06-01 VITALS — BP 160/82 | HR 76 | Ht 64.0 in | Wt 224.2 lb

## 2024-06-01 DIAGNOSIS — E785 Hyperlipidemia, unspecified: Secondary | ICD-10-CM

## 2024-06-01 DIAGNOSIS — R0609 Other forms of dyspnea: Secondary | ICD-10-CM | POA: Diagnosis not present

## 2024-06-01 DIAGNOSIS — Z860101 Personal history of adenomatous and serrated colon polyps: Secondary | ICD-10-CM

## 2024-06-01 NOTE — Patient Instructions (Addendum)
 I have sent in a referral to cardiology Can consider colonoscopy after that evaluation, please call us  and let us  know.   FIBER SUPPLEMENT You can do metamucil or fibercon once or twice a day but if this causes gas/bloating please switch to Benefiber or Citracel.  Fiber is good for constipation/diarrhea/irritable bowel syndrome.  It can also help with weight loss and can help lower your bad cholesterol (LDL).  Please do 1 TBSP in the morning in water, coffee, or tea.  It can take up to a month before you can see a difference with your bowel movements.  It is cheapest from [employer], [employer], [employer].   Toileting tips to help with your constipation - Drink at least 64-80 ounces of water/liquid per day. - Establish a time to try to move your bowels every day.  For many people, this is after a cup of coffee or after a meal such as breakfast. - Sit all of the way back on the toilet keeping your back fairly straight and while sitting up, try to rest the tops of your forearms on your upper thighs.   - Raising your feet with a step stool/squatty potty can be helpful to improve the angle that allows your stool to pass through the rectum. - Relax the rectum feeling it bulge toward the toilet water.  If you feel your rectum raising toward your body, you are contracting rather than relaxing. - Breathe in and slowly exhale. Belly breath by expanding your belly towards your belly button. Keep belly expanded as you gently direct pressure down and back to the anus.  A low pitched GRRR sound can assist with increasing intra-abdominal pressure.  (Can also trying to blow on a pinwheel and make it move, this helps with the same belly breathing) - Repeat 3-4 times. If unsuccessful, contract the pelvic floor to restore normal tone and get off the toilet.  Avoid excessive straining. - To reduce excessive wiping by teaching your anus to normally contract, place hands on outer aspect of knees and resist knee movement  outward.  Hold 5-10 second then place hands just inside of knees and resist inward movement of knees.  Hold 5 seconds.  Repeat a few times each way.  Go to the ER if unable to pass gas, severe AB pain, unable to hold down food, any shortness of breath of chest pain.  _______________________________________________________  If your blood pressure at your visit was 140/90 or greater, please contact your primary care physician to follow up on this.  _______________________________________________________  If you are age 24 or older, your body mass index should be between 23-30. Your Body mass index is 38.49 kg/m. If this is out of the aforementioned range listed, please consider follow up with your Primary Care Provider.  If you are age 67 or younger, your body mass index should be between 19-25. Your Body mass index is 38.49 kg/m. If this is out of the aformentioned range listed, please consider follow up with your Primary Care Provider.   ________________________________________________________  The [facility] providers would like to encourage you to use MYCHART to communicate with providers for non-urgent requests or questions.  Due to long hold times on the telephone, sending your provider a message by [facility] may be a faster and more efficient way to get a response.  Please allow 48 business hours for a response.  Please remember that this is for non-urgent requests.  _______________________________________________________  [facility] is using a team-based approach to care.  Your team is made up of your doctor and two to three APPS. Our APPS (Nurse Practitioners and Physician Assistants) work with your physician to ensure care continuity for you. They are fully qualified to address your health concerns and develop a treatment plan. They communicate directly with your gastroenterologist to care for you. Seeing the Advanced Practice Practitioners on your physician's team can help  you by facilitating care more promptly, often allowing for earlier appointments, access to diagnostic testing, procedures, and other specialty referrals.   Thank you for entrusting me with your care and for choosing [facility], Alan Coombs, P.A.-C

## 2024-06-02 DIAGNOSIS — H353211 Exudative age-related macular degeneration, right eye, with active choroidal neovascularization: Secondary | ICD-10-CM | POA: Diagnosis not present

## 2024-06-02 DIAGNOSIS — H353221 Exudative age-related macular degeneration, left eye, with active choroidal neovascularization: Secondary | ICD-10-CM | POA: Diagnosis not present

## 2024-06-07 NOTE — Progress Notes (Unsigned)
 Cardiology Office Note  Date:  06/08/2024   ID:  Mercedes [name] 05-23-1946, MRN [id number]  PCP:  Cleatus Arlyss RAMAN, MD   Chief Complaint  Patient presents with   New Patient (Initial Visit)    Ref by Mercedes Coombs, PA for shortness of breath, hyperlipidemia and a cardiac clearance for an upcoming colonoscopy.     HPI:  Mercedes [name] is a 78 y.o. female with past medical history of: Past Medical History:  Diagnosis Date   Allergy    Cataract    bilateral-not an issue right now   Diverticulosis of colon    Fatty liver    GERD (gastroesophageal reflux disease)    Gout    HLD (hyperlipidemia)    under control   HTN (hypertension)    Hypothyroidism    Macular degeneration    Osteoarthritis    Rapid heartbeat    occasional, does not last long   Tobacco abuse   Who presents by referral from Mercedes [name] for shortness of breath, hyperlipidemia  Needs to have a colonoscopy, prior hx of polyps [facility]  On discussion of Mercedes [name] symptoms, she reports that she has Some SOB on exertion Relatively new, past 6 months Sedentary last few years, bladder, parathyroid , and spinal surgery in past two years No regular exercise program but does have [employer] at the hospital but has not been going  Appreciates shortness of breath when she is lifting, stretching Taking care of husband, he has parkinsons  quit smoking about 13 years ago.  Started smoking 28 years ago smoking use included cigarettes.  deconditioned/weight running high  Labs: TSH 0.17 BNP 102  LE venous doppler: No DVT  Family history Brother with CAD early 70's,  mom with CAD  EKG personally reviewed by myself on todays visit EKG Interpretation Date/Time:  Monday June 08 2024 11:26:50 EST Ventricular Rate:  76 PR Interval:  166 QRS Duration:  90 QT Interval:  412 QTC Calculation: 463 R Axis:   33  Text Interpretation: Normal sinus rhythm When compared with ECG of 16-May-2022 09:47, No  significant change was found Confirmed by Perla Lye [phone]) on 06/08/2024 11:29:46 AM    PMH:   has a past medical history of Allergy, Cataract, Diverticulosis of colon, Fatty liver, GERD (gastroesophageal reflux disease), Gout, HLD (hyperlipidemia), HTN (hypertension), Hypothyroidism, Macular degeneration, Osteoarthritis, Rapid heartbeat, and Tobacco abuse.   PSH:    Past Surgical History:  Procedure Laterality Date   CATARACT EXTRACTION     CHOLECYSTECTOMY N/A 03/17/2014   Procedure: LAPAROSCOPIC CHOLECYSTECTOMY WITH INTRAOPERATIVE CHOLANGIOGRAM ;  Surgeon: Elon Pacini, MD;  Location: [facility];  Service: General;  Laterality: N/A;   COLONOSCOPY     removed polyps   DILATATION & CURRETTAGE/HYSTEROSCOPY WITH RESECTOCOPE N/A 03/24/2024   Procedure: DILATATION & CURETTAGE/HYSTEROSCOPY WITH RESECTOCOPE;  Surgeon: Herchel Gloris LABOR, MD;  Location: [facility];  Service: Gynecology;  Laterality: N/A;  Hysteroscopy/Polyp resect/D&C   EYE SURGERY  Cataracts   lower back surgery      MYOSURE RESECTION  03/24/2024   Procedure: MYOSURE RESECTION;  Surgeon: Herchel Gloris LABOR, MD;  Location: [facility];  Service: Gynecology;;   PARATHYROIDECTOMY Left 08/20/2022   Procedure: LEFT PARATHYROIDECTOMY;  Surgeon: Eletha Boas, MD;  Location: [facility];  Service: General;  Laterality: Left;   SIGMOIDOSCOPY     SPINE SURGERY      Current Outpatient Medications  Medication Sig Dispense Refill   acetaminophen  (TYLENOL ) 500 MG tablet Take 2 tablets (1,000  mg total) by mouth every 6 (six) hours as needed for moderate pain (pain score 4-6) or mild pain (pain score 1-3).     Aflibercept  (EYLEA ) 2 MG/0.05ML SOLN Injected in R eye.     ascorbic acid (VITAMIN C) 500 MG tablet Take 500 mg by mouth daily.     cholecalciferol (VITAMIN D ) 1000 units tablet Take 1,000 Units by mouth 2 (two) times a day.      colchicine  0.6 MG tablet Take 1 tablet by mouth twice daily as needed 60 tablet 0   CRANBERRY PO Take 1 capsule by  mouth in the morning and at bedtime.     faricimab -svoa (VABYSMO ) 6 MG/0.05ML SOLN intravitreal injection 0.05 mLs (6 mg total) by Intravitreal route as directed. 0.05 mL 0   ibuprofen  (ADVIL ) 600 MG tablet Take 1 tablet (600 mg total) by mouth 3 (three) times daily with meals as needed for headache, mild pain (pain score 1-3), moderate pain (pain score 4-6) or cramping.     levothyroxine  (SYNTHROID ) 200 MCG tablet Take 1 tablet (200 mcg total) by mouth daily before breakfast. 90 tablet 0   levothyroxine  (SYNTHROID ) 50 MCG tablet Take 1 tablet (50 mcg total) by mouth daily before breakfast. Take along with 200 mcg tablet. 90 tablet 0   metoprolol  (TOPROL -XL) 200 MG 24 hr tablet Take 1 tablet (200 mg total) by mouth every morning. 90 tablet 1   Multiple Vitamin (MULTIVITAMIN WITH MINERALS) TABS tablet Take 1 tablet by mouth daily.     Multiple Vitamins-Minerals (PRESERVISION AREDS 2) CAPS Take 1 capsule by mouth 2 (two) times daily.     NON FORMULARY D-MANOOSE     Omega 3 1000 MG CAPS Take 1,000 mg by mouth in the morning and at bedtime.     Potassium 99 MG TABS Take 99 mg by mouth in the morning and at bedtime.     pravastatin  (PRAVACHOL ) 40 MG tablet Held as of 05/25/24     psyllium (REGULOID) 0.52 G capsule Take 1.04 g by mouth daily.     TURMERIC PO Take 750 mg by mouth daily.     [START ON 06/29/2024] valsartan  (DIOVAN ) 160 MG tablet Take 1 tablet (160 mg total) by mouth daily. 90 tablet 2   zinc  gluconate 50 MG tablet Take 50 mg by mouth daily.     No current facility-administered medications for this visit.     Allergies:   Ace inhibitors, Amlodipine , Clarithromycin, Fenofibrate, Lipitor [atorvastatin ], Niacin, Penicillins, Sulfasalazine, and Tetracycline hcl   Social History:  The patient  reports that she quit smoking about 13 years ago. Mercedes [name] smoking use included cigarettes. She started smoking about 28 years ago. She has a 5.2 pack-year smoking history. She has never used smokeless  tobacco. She reports current alcohol use of about 5.0 standard drinks of alcohol per week. She reports that she does not use drugs.   Family History:   family history includes Angina in Mercedes [name] mother; Arthritis in Mercedes [name] mother; Cancer in Mercedes [name] brother; Hearing loss in Mercedes [name] father and mother; Heart disease in Mercedes [name] mother; Hyperlipidemia in Mercedes [name] mother; Hypertension in Mercedes [name] brother; Miscarriages / Stillbirths in Mercedes [name] mother; Prostate cancer in Mercedes [name] brother; Ulcers in Mercedes [name] father; Vision loss in Mercedes [name] father and mother.    Review of Systems: Review of Systems  Constitutional: Negative.   HENT: Negative.    Respiratory:  Positive for shortness of breath.   Cardiovascular: Negative.   Gastrointestinal: Negative.   Musculoskeletal: Negative.   Neurological: Negative.  Psychiatric/Behavioral: Negative.    All other systems reviewed and are negative.   PHYSICAL EXAM: VS:  BP (!) 160/68 (BP Location: Right Arm, Patient Position: Sitting, Cuff Size: Normal)   Pulse 76   Ht 5' 4 (1.626 m)   Wt 223 lb (101.2 kg)   SpO2 98%   BMI 38.28 kg/m  , BMI Body mass index is 38.28 kg/m. GEN: Well nourished, well developed, in no acute distress HEENT: normal Neck: no JVD, carotid bruits, or masses Cardiac: RRR; no murmurs, rubs, or gallops,no edema  Respiratory:  clear to auscultation bilaterally, normal work of breathing GI: soft, nontender, nondistended, + BS MS: no deformity or atrophy Skin: warm and dry, no rash Neuro:  Strength and sensation are intact Psych: euthymic mood, full affect    Recent Labs: 12/12/2023: Brain Natriuretic Peptide 33 03/24/2024: BUN 17; Creatinine, Ser 0.72; Hemoglobin 16.3; Platelets 171; Potassium 4.3; Sodium 141 05/25/2024: ALT 16; Pro B Natriuretic peptide (BNP) 102.0; TSH 0.17    Lipid Panel Lab Results  Component Value Date   CHOL 165 05/25/2024   HDL 42.10 05/25/2024   LDLCALC 125 (H) 01/15/2019   TRIG (H) 05/25/2024    405.0 Triglyceride is over 400; calculations  on Lipids are invalid.      Wt Readings from Last 3 Encounters:  06/08/24 223 lb (101.2 kg)  06/01/24 224 lb 4 oz (101.7 kg)  05/25/24 222 lb (100.7 kg)      ASSESSMENT AND PLAN:  Problem List Items Addressed This Visit       Cardiology Problems   HLD (hyperlipidemia)   Relevant Medications   valsartan  (DIOVAN ) 160 MG tablet   valsartan  (DIOVAN ) 160 MG tablet (Start on 06/29/2024)   HYPERTENSION, BENIGN ESSENTIAL   Relevant Medications   valsartan  (DIOVAN ) 160 MG tablet   valsartan  (DIOVAN ) 160 MG tablet (Start on 06/29/2024)   Other Relevant Orders   EKG 12-Lead (Completed)   Other Visit Diagnoses       Dyspnea on exertion    -  Primary   Relevant Orders   EKG 12-Lead (Completed)   ECHOCARDIOGRAM COMPLETE     Preop cardiovascular exam         Obesity (BMI 35.0-39.9 without comorbidity)          Preop cardiovascular evaluation Shortness of breath symptoms more noticeable over the past 6 months.  Possibly secondary to deconditioning over the past 2 years.  Echocardiogram ordered to evaluate cardiac function, rule out underlying valve disease or pulmonary hypertension.  If echo looks reasonable, could be cleared for colonoscopy  Shortness of breath Numerous surgeries over the past 2 years including parathyroid , spinal, bladder with long recoveries, has not been exercising or doing Mercedes [name] normal activities -Has membership at [employer] but has not been going to the hospital exercise facility - Echocardiogram ordered as above - BNP running low, less likely CHF - Low suspicion for underlying ischemia - Recommended regular walking program for weight loss and conditioning  Essential hypertension Reports he takes metoprolol  succinate 200 daily, did not tolerate amlodipine  secondary to leg swelling Pressures running high at home Recommend she start valsartan  in 80 mg daily for 1 week titrating up to 160 mg daily if pressure continues to run  high  Hyperlipidemia Continue pravastatin  40 daily  Signed, Velinda Lunger, M.D., Ph.D. [facility] [place], [place] [phone]

## 2024-06-08 ENCOUNTER — Other Ambulatory Visit: Payer: Self-pay

## 2024-06-08 ENCOUNTER — Ambulatory Visit: Attending: Cardiovascular Disease | Admitting: Cardiovascular Disease

## 2024-06-08 ENCOUNTER — Other Ambulatory Visit (HOSPITAL_COMMUNITY): Payer: Self-pay

## 2024-06-08 ENCOUNTER — Encounter: Payer: Self-pay | Admitting: Cardiovascular Disease

## 2024-06-08 VITALS — BP 160/68 | HR 76 | Ht 64.0 in | Wt 223.0 lb

## 2024-06-08 DIAGNOSIS — R0609 Other forms of dyspnea: Secondary | ICD-10-CM | POA: Diagnosis not present

## 2024-06-08 DIAGNOSIS — E782 Mixed hyperlipidemia: Secondary | ICD-10-CM | POA: Diagnosis not present

## 2024-06-08 DIAGNOSIS — Z01810 Encounter for preprocedural cardiovascular examination: Secondary | ICD-10-CM

## 2024-06-08 DIAGNOSIS — E669 Obesity, unspecified: Secondary | ICD-10-CM | POA: Diagnosis not present

## 2024-06-08 DIAGNOSIS — I10 Essential (primary) hypertension: Secondary | ICD-10-CM | POA: Diagnosis not present

## 2024-06-08 MED ORDER — VALSARTAN 160 MG PO TABS
160.0000 mg | ORAL_TABLET | Freq: Every day | ORAL | 0 refills | Status: AC
  Filled 2024-06-08: qty 90, 90d supply, fill #0

## 2024-06-08 MED ORDER — VALSARTAN 160 MG PO TABS
160.0000 mg | ORAL_TABLET | Freq: Every day | ORAL | 2 refills | Status: AC
  Filled 2024-06-08: qty 90, 90d supply, fill #0

## 2024-06-08 MED ORDER — VALSARTAN 160 MG PO TABS
160.0000 mg | ORAL_TABLET | Freq: Every day | ORAL | 3 refills | Status: DC
  Filled 2024-06-08: qty 90, 90d supply, fill #0

## 2024-06-08 NOTE — Patient Instructions (Addendum)
 Medication Instructions:   Please start valsartan  160 mg daily  If you need a refill on your cardiac medications before your next appointment, please call your pharmacy.   Lab work: No new labs needed  Testing/Procedures:  Your physician has requested that you have an echocardiogram. Echocardiography is a painless test that uses sound waves to create images of your heart. It provides your doctor with information about the size and shape of your heart and how well your heart's chambers and valves are working.   You may receive an ultrasound enhancing agent through an IV if needed to better visualize your heart during the echo. This procedure takes approximately one hour.  There are no restrictions for this procedure.  This will take place at [address] [facility] [address]) [address], [place] [address]  Please note: We ask at that you not bring children with you during ultrasound (echo/ vascular) testing. Due to room size and safety concerns, children are not allowed in the ultrasound rooms during exams. Our front office staff cannot provide observation of children in our lobby area while testing is being conducted. An adult accompanying a patient to their appointment will only be allowed in the ultrasound room at the discretion of the ultrasound technician under special circumstances. We apologize for any inconvenience.   Follow-Up: At [facility], you and your health needs are our priority.  As part of our continuing mission to provide you with exceptional heart care, we have created designated Provider Care Teams.  These Care Teams include your primary Cardiologist (physician) and Advanced Practice Providers (APPs -  Physician Assistants and Nurse Practitioners) who all work together to provide you with the care you need, when you need it.  You will need a follow up appointment as needed  Providers on your designated Care Team:   Lonni Meager, NP Bernardino Bring, PA-C [place]  [place], [place]  COVID-19 Vaccine Information can be found at: [email or web address] For questions related to vaccine distribution or appointments, please email [email or web address] or call 7195319362.

## 2024-06-09 ENCOUNTER — Other Ambulatory Visit (HOSPITAL_COMMUNITY): Payer: Self-pay

## 2024-06-11 ENCOUNTER — Other Ambulatory Visit: Payer: Self-pay | Admitting: Family Medicine

## 2024-06-11 MED ORDER — PRAVASTATIN SODIUM 40 MG PO TABS: ORAL_TABLET | ORAL | Status: DC

## 2024-06-19 ENCOUNTER — Ambulatory Visit: Admitting: Obstetrics and Gynecology

## 2024-06-19 ENCOUNTER — Encounter: Payer: Self-pay | Admitting: Obstetrics and Gynecology

## 2024-06-19 VITALS — BP 148/82 | HR 80

## 2024-06-19 DIAGNOSIS — N3281 Overactive bladder: Secondary | ICD-10-CM | POA: Diagnosis not present

## 2024-06-19 NOTE — Progress Notes (Signed)
 [name] Return Visit  SUBJECTIVE  History of Present Illness: Mercedes [name] is a 78 y.o. female seen in follow-up for OAB. Patient has tried and failed Myrbetriq , Gemtesa , and Trospium .   At night she has been able to go about 3 hours before she has to urinate. During the day, she is going more frequently. She has strong urgency but denies incontinence.   She was considering botox but was unable to do the self-catheterization so is second guessing this option.    Past Medical History: Patient  has a past medical history of Allergy, Cataract, Diverticulosis of colon, Fatty liver, GERD (gastroesophageal reflux disease), Gout, HLD (hyperlipidemia), HTN (hypertension), Hypothyroidism, Macular degeneration, Osteoarthritis, Rapid heartbeat, and Tobacco abuse.   Past Surgical History: She  has a past surgical history that includes Sigmoidoscopy; Colonoscopy; Cholecystectomy (N/A, 03/17/2014); Cataract extraction; lower back surgery ; Parathyroidectomy (Left, 08/20/2022); Dilatation & currettage/hysteroscopy with resectoscope (N/A, 03/24/2024); Myosure resection (03/24/2024); Eye surgery (Cataracts); and Spine surgery.   Medications: She has a current medication list which includes the following prescription(s): acetaminophen , eylea , ascorbic acid, cholecalciferol, colchicine , cranberry, vabysmo , ibuprofen , levothyroxine , levothyroxine , metoprolol , multivitamin with minerals, preservision areds 2, NON FORMULARY, omega 3, potassium, pravastatin , psyllium, turmeric, valsartan , [START ON 06/29/2024] valsartan , and zinc  gluconate.   Allergies: Patient is allergic to ace inhibitors, amlodipine , clarithromycin, fenofibrate, lipitor [atorvastatin ], niacin, penicillins, sulfasalazine, and tetracycline hcl.   Social History: Patient  reports that she quit smoking about 13 years ago. Her smoking use included cigarettes. She started smoking about 28 years ago. She has a 5.2 pack-year  smoking history. She has never used smokeless tobacco. She reports current alcohol use of about 5.0 standard drinks of alcohol per week. She reports that she does not use drugs.     OBJECTIVE     Physical Exam: Vitals:   06/19/24 1255  BP: (!) 148/82  Pulse: 80   Gen: No apparent distress, A&O x 3.  Detailed Urogynecologic Evaluation:  White discharge on labia minora- aptima obtained. A spot of orange was also noted, but no discharge on speculum. Normal vaginal mucosa and normal appearing cervix. On bimanual, uterus is small, mobile and nontender.   Normal rectovaginal exam, rectocele present.     ASSESSMENT AND PLAN    Ms. Hagey is a 78 y.o. with:  1. Overactive bladder     - We discussed the role of sacral neuromodulation and how it works. It requires a test phase, and documentation of bladder function via diary. After a successful test period, a permanent wire and generator are placed in the OR. The battery lasts 5 years on average and would need to be replaced surgically.  The goal of this therapy is at least a 50% improvement in symptoms. It is NOT realistic to expect a 100% cure.  We reviewed the fact that about 30% of patients fail the test phase and are not candidates for permanent generator placement.   - We discussed the role of percutaneous tibial nerve stimulation and how it works.  She understands it requires 12 weekly visits for temporary neuromodulation of the sacral nerve roots via the tibial nerve and that she may then require continued tapered treatment.  There is also the option of an implantable tibial nerve stimulator, but this is only indicated for urge incontinence.  - She prefers to try PTNS. Will have her fill out a baseline bladder diary and return for her first session.   Rosaline LOISE Caper, MD

## 2024-06-23 ENCOUNTER — Other Ambulatory Visit: Payer: Self-pay | Admitting: Family Medicine

## 2024-06-23 MED ORDER — PRAVASTATIN SODIUM 40 MG PO TABS: ORAL_TABLET | ORAL | Status: AC

## 2024-07-03 ENCOUNTER — Other Ambulatory Visit (HOSPITAL_COMMUNITY): Payer: Self-pay

## 2024-07-03 ENCOUNTER — Other Ambulatory Visit: Payer: Self-pay | Admitting: Family Medicine

## 2024-07-06 MED ORDER — LEVOTHYROXINE SODIUM 50 MCG PO TABS
50.0000 ug | ORAL_TABLET | Freq: Every day | ORAL | 0 refills | Status: DC
  Filled 2024-07-06: qty 90, 90d supply, fill #0

## 2024-07-07 ENCOUNTER — Other Ambulatory Visit (HOSPITAL_COMMUNITY): Payer: Self-pay

## 2024-07-13 ENCOUNTER — Encounter: Payer: Self-pay | Admitting: *Deleted

## 2024-07-16 ENCOUNTER — Other Ambulatory Visit (HOSPITAL_COMMUNITY): Payer: Self-pay

## 2024-07-16 ENCOUNTER — Ambulatory Visit: Attending: Cardiovascular Disease

## 2024-07-16 ENCOUNTER — Other Ambulatory Visit: Payer: Self-pay

## 2024-07-16 DIAGNOSIS — R0609 Other forms of dyspnea: Secondary | ICD-10-CM

## 2024-07-16 LAB — ECHOCARDIOGRAM COMPLETE
AR max vel: 1.66 cm2
AV Area VTI: 1.75 cm2
AV Area mean vel: 1.67 cm2
AV Mean grad: 6 mmHg
AV Peak grad: 11.2 mmHg
Ao pk vel: 1.67 m/s
Area-P 1/2: 3.02 cm2
S' Lateral: 2.6 cm

## 2024-07-17 ENCOUNTER — Ambulatory Visit: Payer: Self-pay | Admitting: Cardiovascular Disease

## 2024-07-17 ENCOUNTER — Other Ambulatory Visit: Payer: Self-pay

## 2024-07-21 LAB — HM MAMMOGRAPHY

## 2024-07-22 ENCOUNTER — Ambulatory Visit: Payer: Self-pay

## 2024-07-22 NOTE — Telephone Encounter (Signed)
 FYI Only or Action Required?: FYI only for provider: appointment scheduled on 07/23/24.  Patient was last seen in primary care on 05/25/2024 by Cleatus Arlyss RAMAN, MD.  Called Nurse Triage reporting Dysuria and Urinary Frequency.  Symptoms began several days ago.  Interventions attempted: Rest, hydration, or home remedies.  Symptoms are: unchanged.  Triage Disposition: See Physician Within 24 Hours  Patient/caregiver understands and will follow disposition?: Yes   Reason for Disposition  Age > 50 years  Answer Assessment - Initial Assessment Questions Patient states that she started to experience dysuria with frequency and urgency about 3 days ago. She does have a history of frequent UTIs. Office visit advised.   1. SEVERITY: How bad is the pain?  (e.g., Scale 1-10; mild, moderate, or severe)     6/10  2. FREQUENCY: How many times have you had painful urination today?      Unknown  3. PATTERN: Is pain present every time you urinate or just sometimes?      Yes  4. ONSET: When did the painful urination start?      About 3 days ago  5. FEVER: Do you have a fever? If Yes, ask: What is your temperature, how was it measured, and when did it start?     No  6. PAST UTI: Have you had a urine infection before? If Yes, ask: When was the last time? and What happened that time?      Yes, frequent  7. CAUSE: What do you think is causing the painful urination?  (e.g., UTI, scratch, Herpes sore)     UTI  8. OTHER SYMPTOMS: Do you have any other symptoms? (e.g., blood in urine, flank pain, genital sores, urgency, vaginal discharge)     Urgency  9. PREGNANCY: Is there any chance you are pregnant? When was your last menstrual period?     NA  Protocols used: Urination Pain - Female-A-AH  Reason for Triage: Pt would like to know what options she has, pt currently has UTI and has ongiong issue. Should she comes in for urine sample or be seen.    Please call  [phone]

## 2024-07-22 NOTE — Telephone Encounter (Signed)
 Thank you for getting patient scheduled.

## 2024-07-23 ENCOUNTER — Ambulatory Visit: Admitting: Family Medicine

## 2024-07-23 ENCOUNTER — Encounter: Payer: Self-pay | Admitting: Family Medicine

## 2024-07-23 ENCOUNTER — Other Ambulatory Visit: Payer: Self-pay

## 2024-07-23 ENCOUNTER — Other Ambulatory Visit

## 2024-07-23 VITALS — BP 166/90 | HR 72 | Temp 97.9°F | Ht 64.0 in | Wt 222.2 lb

## 2024-07-23 DIAGNOSIS — R300 Dysuria: Secondary | ICD-10-CM | POA: Diagnosis not present

## 2024-07-23 DIAGNOSIS — E038 Other specified hypothyroidism: Secondary | ICD-10-CM

## 2024-07-23 LAB — POC URINALSYSI DIPSTICK (AUTOMATED)
Bilirubin, UA: NEGATIVE
Blood, UA: POSITIVE
Glucose, UA: NEGATIVE
Ketones, UA: NEGATIVE
Nitrite, UA: NEGATIVE
Protein, UA: POSITIVE — AB
Spec Grav, UA: 1.015
Urobilinogen, UA: 0.2 U/dL
pH, UA: 6

## 2024-07-23 LAB — TSH: TSH: 0.11 u[IU]/mL — ABNORMAL LOW (ref 0.35–5.50)

## 2024-07-23 MED ORDER — CEPHALEXIN 500 MG PO CAPS
500.0000 mg | ORAL_CAPSULE | Freq: Three times a day (TID) | ORAL | 0 refills | Status: AC
  Filled 2024-07-23: qty 21, 7d supply, fill #0

## 2024-07-23 NOTE — Addendum Note (Signed)
 Addended by: BAXTER PULLER on: 07/23/2024 11:31 AM   Modules accepted: Orders

## 2024-07-23 NOTE — Assessment & Plan Note (Signed)
 Acute, urinalysis concerning for urinary tract infection. Patient with postmenopausal vaginal issues and urinary frequency failed estrogen suppositories. Recent history of recurrent UTI June, November and again likely today. Will send urine for culture.  Recent use of Cipro .  Recent bacteria resistant to Macrodantin .  She is sulfa  allergic, penicillin allergic but has tolerated Keflex  in the past. Treat with Keflex  500 mg p.o. 3 times daily x 7 days. Push water. Return and ER precautions provided.

## 2024-07-23 NOTE — Progress Notes (Signed)
 "   Patient ID: Mercedes [name], female    DOB: 11-Mar-1946, 79 y.o.   MRN: [id number]  This visit was conducted in person.  BP (!) 166/90 (BP Location: Left Arm, Patient Position: Sitting, Cuff Size: Normal)   Pulse 72   Temp 97.9 F (36.6 C) (Oral)   Ht 5' 4 (1.626 m)   Wt 222 lb 3.2 oz (100.8 kg)   SpO2 96%   BMI 38.14 kg/m    CC:  Chief Complaint  Patient presents with   Acute Visit    Burning while urinating and Urinary Frequency, onset several days    Subjective:   HPI: Mercedes [name] is a 79 y.o. female presenting on 07/23/2024 for Acute Visit (Burning while urinating and Urinary Frequency, onset several days)  Dysuria  This is a new problem. The current episode started in the past 7 days. The problem has been gradually worsening. The quality of the pain is described as burning. The pain is at a severity of 5/10. The pain is moderate. There has been no fever. She is Not sexually active. There is No history of pyelonephritis. Associated symptoms include frequency and urgency. Pertinent negatives include no discharge, flank pain, hematuria, nausea or vomiting. She has tried nothing for the symptoms. The treatment provided no relief. Her past medical history is significant for recurrent UTIs. There is no history of catheterization, kidney stones, a single kidney, urinary stasis or a urological procedure.  PCP: Cleatus  Last UTI May 25, 2024 E. Col pansensitive and Proteus mirabilis, resistant to nitrofurantoin   Treated by Dr. Cleatus with Cipro . Symptoms resolved completely. Prior to that E. coli and Citrobacter noted December 06, 2023.    She is patient of Urogynceologist... treating vaginal dryness and pain... always has urinary  frequency  04/2024 hysteroscopy.  Relevant past medical, surgical, family and social history reviewed and updated as indicated. Interim medical history since our last visit reviewed. Allergies and medications reviewed and updated. Outpatient  Medications Prior to Visit  Medication Sig Dispense Refill   acetaminophen  (TYLENOL ) 500 MG tablet Take 2 tablets (1,000 mg total) by mouth every 6 (six) hours as needed for moderate pain (pain score 4-6) or mild pain (pain score 1-3).     Aflibercept  (EYLEA ) 2 MG/0.05ML SOLN Injected in R eye.     ascorbic acid (VITAMIN C) 500 MG tablet Take 500 mg by mouth daily.     cholecalciferol (VITAMIN D ) 1000 units tablet Take 1,000 Units by mouth 2 (two) times a day.      colchicine  0.6 MG tablet Take 1 tablet by mouth twice daily as needed 60 tablet 0   CRANBERRY PO Take 1 capsule by mouth in the morning and at bedtime.     faricimab -svoa (VABYSMO ) 6 MG/0.05ML SOLN intravitreal injection 0.05 mLs (6 mg total) by Intravitreal route as directed. 0.05 mL 0   ibuprofen  (ADVIL ) 600 MG tablet Take 1 tablet (600 mg total) by mouth 3 (three) times daily with meals as needed for headache, mild pain (pain score 1-3), moderate pain (pain score 4-6) or cramping.     levothyroxine  (SYNTHROID ) 200 MCG tablet Take 1 tablet (200 mcg total) by mouth daily before breakfast. 90 tablet 0   levothyroxine  (SYNTHROID ) 50 MCG tablet Take 1 tablet (50 mcg total) by mouth daily before breakfast. Take along with 200 mcg tablet. 90 tablet 0   metoprolol  (TOPROL -XL) 200 MG 24 hr tablet Take 1 tablet (200 mg total) by mouth every morning. 90 tablet  1   Multiple Vitamin (MULTIVITAMIN WITH MINERALS) TABS tablet Take 1 tablet by mouth daily.     Multiple Vitamins-Minerals (PRESERVISION AREDS 2) CAPS Take 1 capsule by mouth 2 (two) times daily.     NON FORMULARY D-MANOOSE     Omega 3 1000 MG CAPS Take 1,000 mg by mouth in the morning and at bedtime.     Potassium 99 MG TABS Take 99 mg by mouth in the morning and at bedtime.     pravastatin  (PRAVACHOL ) 40 MG tablet 20 mg daily if tolerated.     psyllium (REGULOID) 0.52 G capsule Take 1.04 g by mouth daily.     TURMERIC PO Take 750 mg by mouth daily.     valsartan  (DIOVAN ) 160 MG tablet  Take 1 tablet (160 mg total) by mouth daily. 90 tablet 0   valsartan  (DIOVAN ) 160 MG tablet Take 1 tablet (160 mg total) by mouth daily. 90 tablet 2   zinc  gluconate 50 MG tablet Take 50 mg by mouth daily.     No facility-administered medications prior to visit.     Per HPI unless specifically indicated in ROS section below Review of Systems  Gastrointestinal:  Negative for nausea and vomiting.  Genitourinary:  Positive for dysuria, frequency and urgency. Negative for flank pain and hematuria.   Objective:  BP (!) 166/90 (BP Location: Left Arm, Patient Position: Sitting, Cuff Size: Normal)   Pulse 72   Temp 97.9 F (36.6 C) (Oral)   Ht 5' 4 (1.626 m)   Wt 222 lb 3.2 oz (100.8 kg)   SpO2 96%   BMI 38.14 kg/m   Wt Readings from Last 3 Encounters:  07/23/24 222 lb 3.2 oz (100.8 kg)  06/08/24 223 lb (101.2 kg)  06/01/24 224 lb 4 oz (101.7 kg)      Physical Exam Constitutional:      General: She is not in acute distress.    Appearance: Normal appearance. She is well-developed. She is not ill-appearing or toxic-appearing.  HENT:     Head: Normocephalic.     Right Ear: Hearing, tympanic membrane, ear canal and external ear normal. Tympanic membrane is not erythematous, retracted or bulging.     Left Ear: Hearing, tympanic membrane, ear canal and external ear normal. Tympanic membrane is not erythematous, retracted or bulging.     Nose: No mucosal edema or rhinorrhea.     Right Sinus: No maxillary sinus tenderness or frontal sinus tenderness.     Left Sinus: No maxillary sinus tenderness or frontal sinus tenderness.     Mouth/Throat:     Pharynx: Uvula midline.  Eyes:     General: Lids are normal. Lids are everted, no foreign bodies appreciated.     Conjunctiva/sclera: Conjunctivae normal.     Pupils: Pupils are equal, round, and reactive to light.  Neck:     Thyroid : No thyroid  mass or thyromegaly.     Vascular: No carotid bruit.     Trachea: Trachea normal.   Cardiovascular:     Rate and Rhythm: Normal rate and regular rhythm.     Pulses: Normal pulses.     Heart sounds: Normal heart sounds, S1 normal and S2 normal. No murmur heard.    No friction rub. No gallop.  Pulmonary:     Effort: Pulmonary effort is normal. No tachypnea or respiratory distress.     Breath sounds: Normal breath sounds. No decreased breath sounds, wheezing, rhonchi or rales.  Abdominal:     General: Bowel  sounds are normal.     Palpations: Abdomen is soft.     Tenderness: There is no abdominal tenderness. There is no right CVA tenderness, left CVA tenderness, guarding or rebound.  Musculoskeletal:     Cervical back: Normal range of motion and neck supple.     Lumbar back: Tenderness present.  Skin:    General: Skin is warm and dry.     Findings: No rash.  Neurological:     Mental Status: She is alert.  Psychiatric:        Mood and Affect: Mood is not anxious or depressed.        Speech: Speech normal.        Behavior: Behavior normal. Behavior is cooperative.        Thought Content: Thought content normal.        Judgment: Judgment normal.       Results for orders placed or performed in visit on 07/23/24  POCT Urinalysis Dipstick (Automated)   Collection Time: 07/23/24 11:00 AM  Result Value Ref Range   Color, UA Yellow    Clarity, UA turbid    Glucose, UA Negative Negative   Bilirubin, UA Negative    Ketones, UA Negative    Spec Grav, UA 1.015 1.010 - 1.025   Blood, UA positive    pH, UA 6.0 5.0 - 8.0   Protein, UA Positive (A) Negative   Urobilinogen, UA 0.2 0.2 or 1.0 E.U./dL   Nitrite, UA Negative    Leukocytes, UA Moderate (2+) (A) Negative    Assessment and Plan  Dysuria Assessment & Plan: Acute, urinalysis concerning for urinary tract infection. Patient with postmenopausal vaginal issues and urinary frequency failed estrogen suppositories. Recent history of recurrent UTI June, November and again likely today. Will send urine for  culture.  Recent use of Cipro .  Recent bacteria resistant to Macrodantin .  She is sulfa  allergic, penicillin allergic but has tolerated Keflex  in the past. Treat with Keflex  500 mg p.o. 3 times daily x 7 days. Push water. Return and ER precautions provided.   Orders: -     POCT Urinalysis Dipstick (Automated)  Other orders -     Cephalexin ; Take 1 capsule (500 mg total) by mouth 3 (three) times daily.  Dispense: 21 capsule; Refill: 0    Return if symptoms worsen or fail to improve.   Greig Ring, MD  "

## 2024-07-24 ENCOUNTER — Other Ambulatory Visit: Payer: Self-pay

## 2024-07-24 ENCOUNTER — Other Ambulatory Visit: Payer: Self-pay | Admitting: Family Medicine

## 2024-07-24 ENCOUNTER — Ambulatory Visit: Payer: Self-pay | Admitting: Family Medicine

## 2024-07-24 DIAGNOSIS — E038 Other specified hypothyroidism: Secondary | ICD-10-CM

## 2024-07-24 MED ORDER — LEVOTHYROXINE SODIUM 25 MCG PO TABS
25.0000 ug | ORAL_TABLET | Freq: Every day | ORAL | 3 refills | Status: AC
  Filled 2024-07-24: qty 90, 90d supply, fill #0

## 2024-07-25 LAB — URINE CULTURE
MICRO NUMBER:: 17502718
SPECIMEN QUALITY:: ADEQUATE

## 2024-07-27 ENCOUNTER — Other Ambulatory Visit

## 2024-07-30 ENCOUNTER — Other Ambulatory Visit: Payer: Self-pay

## 2024-08-06 ENCOUNTER — Ambulatory Visit: Payer: Self-pay | Admitting: Family Medicine

## 2025-03-09 ENCOUNTER — Ambulatory Visit: Admitting: Dermatology

## 2025-04-23 ENCOUNTER — Ambulatory Visit
